# Patient Record
Sex: Female | Born: 1950
Health system: Southern US, Community
[De-identification: ages and names within clinical notes are randomized; demographics above are authoritative.]

## PROBLEM LIST (undated history)

## (undated) DIAGNOSIS — I499 Cardiac arrhythmia, unspecified: Secondary | ICD-10-CM

## (undated) DIAGNOSIS — G20 Parkinson's disease: Secondary | ICD-10-CM

## (undated) DIAGNOSIS — E041 Nontoxic single thyroid nodule: Secondary | ICD-10-CM

## (undated) DIAGNOSIS — F32A Depression, unspecified: Secondary | ICD-10-CM

## (undated) DIAGNOSIS — K5790 Diverticulosis of intestine, part unspecified, without perforation or abscess without bleeding: Secondary | ICD-10-CM

## (undated) DIAGNOSIS — I509 Heart failure, unspecified: Secondary | ICD-10-CM

## (undated) DIAGNOSIS — E785 Hyperlipidemia, unspecified: Secondary | ICD-10-CM

## (undated) DIAGNOSIS — F429 Obsessive-compulsive disorder, unspecified: Secondary | ICD-10-CM

## (undated) DIAGNOSIS — K449 Diaphragmatic hernia without obstruction or gangrene: Secondary | ICD-10-CM

## (undated) DIAGNOSIS — K279 Peptic ulcer, site unspecified, unspecified as acute or chronic, without hemorrhage or perforation: Secondary | ICD-10-CM

## (undated) DIAGNOSIS — F419 Anxiety disorder, unspecified: Secondary | ICD-10-CM

## (undated) DIAGNOSIS — K219 Gastro-esophageal reflux disease without esophagitis: Secondary | ICD-10-CM

## (undated) DIAGNOSIS — F329 Major depressive disorder, single episode, unspecified: Secondary | ICD-10-CM

## (undated) DIAGNOSIS — I82409 Acute embolism and thrombosis of unspecified deep veins of unspecified lower extremity: Secondary | ICD-10-CM

## (undated) DIAGNOSIS — G20A1 Parkinson's disease without dyskinesia, without mention of fluctuations: Secondary | ICD-10-CM

## (undated) DIAGNOSIS — E079 Disorder of thyroid, unspecified: Secondary | ICD-10-CM

## (undated) DIAGNOSIS — F819 Developmental disorder of scholastic skills, unspecified: Secondary | ICD-10-CM

## (undated) DIAGNOSIS — I10 Essential (primary) hypertension: Secondary | ICD-10-CM

## (undated) DIAGNOSIS — S42301A Unspecified fracture of shaft of humerus, right arm, initial encounter for closed fracture: Secondary | ICD-10-CM

## (undated) DIAGNOSIS — Z9181 History of falling: Secondary | ICD-10-CM

## (undated) DIAGNOSIS — M810 Age-related osteoporosis without current pathological fracture: Secondary | ICD-10-CM

## (undated) DIAGNOSIS — C801 Malignant (primary) neoplasm, unspecified: Secondary | ICD-10-CM

## (undated) DIAGNOSIS — S42309A Unspecified fracture of shaft of humerus, unspecified arm, initial encounter for closed fracture: Secondary | ICD-10-CM

## (undated) DIAGNOSIS — J189 Pneumonia, unspecified organism: Secondary | ICD-10-CM

## (undated) DIAGNOSIS — F445 Conversion disorder with seizures or convulsions: Secondary | ICD-10-CM

## (undated) DIAGNOSIS — R0600 Dyspnea, unspecified: Secondary | ICD-10-CM

## (undated) DIAGNOSIS — E039 Hypothyroidism, unspecified: Secondary | ICD-10-CM

## (undated) DIAGNOSIS — D509 Iron deficiency anemia, unspecified: Secondary | ICD-10-CM

## (undated) HISTORY — DX: Major depressive disorder, single episode, unspecified: F32.9

## (undated) HISTORY — DX: Gastro-esophageal reflux disease without esophagitis: K21.9

## (undated) HISTORY — DX: Essential (primary) hypertension: I10

## (undated) HISTORY — DX: Age-related osteoporosis without current pathological fracture: M81.0

## (undated) HISTORY — DX: Hyperlipidemia, unspecified: E78.5

## (undated) HISTORY — DX: Depression, unspecified: F32.A

## (undated) HISTORY — DX: Parkinson's disease: G20

## (undated) HISTORY — DX: Anxiety disorder, unspecified: F41.9

## (undated) HISTORY — DX: Parkinson's disease without dyskinesia, without mention of fluctuations: G20.A1

## (undated) HISTORY — PX: TONSILLECTOMY: SUR1361

## (undated) HISTORY — DX: Nontoxic single thyroid nodule: E04.1

## (undated) HISTORY — DX: Developmental disorder of scholastic skills, unspecified: F81.9

## (undated) HISTORY — DX: Disorder of thyroid, unspecified: E07.9

## (undated) HISTORY — DX: Acute embolism and thrombosis of unspecified deep veins of unspecified lower extremity: I82.409

## (undated) HISTORY — DX: Unspecified fracture of shaft of humerus, unspecified arm, initial encounter for closed fracture: S42.309A

## (undated) HISTORY — DX: Diverticulosis of intestine, part unspecified, without perforation or abscess without bleeding: K57.90

## (undated) HISTORY — DX: Iron deficiency anemia, unspecified: D50.9

## (undated) HISTORY — DX: Peptic ulcer, site unspecified, unspecified as acute or chronic, without hemorrhage or perforation: K27.9

## (undated) HISTORY — DX: Conversion disorder with seizures or convulsions: F44.5

---

## 1998-10-08 ENCOUNTER — Other Ambulatory Visit: Admission: RE | Admit: 1998-10-08 | Discharge: 1998-10-08 | Payer: Self-pay | Admitting: Family Medicine

## 1999-10-21 ENCOUNTER — Other Ambulatory Visit: Admission: RE | Admit: 1999-10-21 | Discharge: 1999-10-21 | Payer: Self-pay | Admitting: Family Medicine

## 2000-03-22 ENCOUNTER — Emergency Department (HOSPITAL_COMMUNITY): Admission: EM | Admit: 2000-03-22 | Discharge: 2000-03-22 | Payer: Self-pay | Admitting: Emergency Medicine

## 2000-03-23 ENCOUNTER — Encounter: Payer: Self-pay | Admitting: Emergency Medicine

## 2000-04-21 ENCOUNTER — Encounter: Payer: Self-pay | Admitting: Family Medicine

## 2000-04-21 ENCOUNTER — Ambulatory Visit (HOSPITAL_COMMUNITY): Admission: RE | Admit: 2000-04-21 | Discharge: 2000-04-21 | Payer: Self-pay | Admitting: Family Medicine

## 2003-01-22 ENCOUNTER — Other Ambulatory Visit: Admission: RE | Admit: 2003-01-22 | Discharge: 2003-01-22 | Payer: Self-pay | Admitting: Family Medicine

## 2011-12-10 ENCOUNTER — Encounter: Payer: Self-pay | Admitting: Gastroenterology

## 2012-01-21 ENCOUNTER — Encounter: Payer: Self-pay | Admitting: Gastroenterology

## 2012-11-13 ENCOUNTER — Other Ambulatory Visit: Payer: Self-pay | Admitting: Family Medicine

## 2012-11-13 ENCOUNTER — Other Ambulatory Visit: Payer: Self-pay | Admitting: *Deleted

## 2012-11-13 DIAGNOSIS — F411 Generalized anxiety disorder: Secondary | ICD-10-CM

## 2012-11-13 MED ORDER — ALPRAZOLAM 0.5 MG PO TBDP: 0.5000 mg | ORAL_TABLET | Freq: Three times a day (TID) | ORAL | Status: DC | PRN

## 2012-11-13 NOTE — Telephone Encounter (Signed)
Last filled 10/11/12. Must have kay call into [facility] in [place] at [phone]

## 2012-11-13 NOTE — Telephone Encounter (Signed)
Pt is requesting a 90 day supply of risperdal, last seen 08/07/12

## 2012-11-13 NOTE — Telephone Encounter (Signed)
Pt's father notified RX phoned into [facility]

## 2012-11-14 ENCOUNTER — Telehealth: Payer: Self-pay | Admitting: *Deleted

## 2012-11-14 ENCOUNTER — Telehealth: Payer: Self-pay | Admitting: Family Medicine

## 2012-11-14 NOTE — Telephone Encounter (Signed)
Pt has been on Lorazepam 0.5mg  for years, requested refill on 11/13/2012 and was given Xanax Please give refill for correct med

## 2012-11-15 NOTE — Telephone Encounter (Signed)
Notified pharmacy of correct RX Pt's sister notified

## 2012-11-15 NOTE — Telephone Encounter (Signed)
This has been taken care of. Joyce Gross spoke with her sister yesterday. The medication is to be returned and ativan is to be dispenced instead . Call and speak to Mr Ditton and reassure  Him.

## 2012-11-15 NOTE — Telephone Encounter (Signed)
Please advise 

## 2012-11-16 NOTE — Telephone Encounter (Signed)
Spoke with Mr Neuwirth, [place] taken care of

## 2012-11-24 ENCOUNTER — Other Ambulatory Visit: Payer: Self-pay | Admitting: Family Medicine

## 2013-02-12 ENCOUNTER — Other Ambulatory Visit: Payer: Self-pay | Admitting: *Deleted

## 2013-02-12 ENCOUNTER — Other Ambulatory Visit: Payer: Self-pay | Admitting: Family Medicine

## 2013-02-12 ENCOUNTER — Telehealth: Payer: Self-pay | Admitting: Family Medicine

## 2013-02-12 DIAGNOSIS — F411 Generalized anxiety disorder: Secondary | ICD-10-CM

## 2013-02-12 MED ORDER — RISPERIDONE 0.25 MG PO TABS: ORAL_TABLET | ORAL | Status: DC

## 2013-02-12 MED ORDER — ALPRAZOLAM 0.5 MG PO TBDP: 0.5000 mg | ORAL_TABLET | Freq: Three times a day (TID) | ORAL | Status: DC | PRN

## 2013-02-12 NOTE — Telephone Encounter (Signed)
Per [facility] ok to refill Lorazepam and Risperdal for 6 mths Refills called into [facility] on [address] Pt's father notified

## 2013-02-14 NOTE — Telephone Encounter (Signed)
Last seen 08/07/12  [facility]    If approved route to nurse to call in and notify patient

## 2013-02-28 ENCOUNTER — Telehealth: Payer: Self-pay | Admitting: *Deleted

## 2013-02-28 MED ORDER — ESCITALOPRAM OXALATE 20 MG PO TABS: 20.0000 mg | ORAL_TABLET | Freq: Every day | ORAL | Status: DC

## 2013-02-28 NOTE — Telephone Encounter (Signed)
Pt's father called to request refill on Lexapro 20mg 

## 2013-02-28 NOTE — Telephone Encounter (Signed)
This is okay to refill for 6 months Make sure that someone is following her regularly

## 2013-05-14 ENCOUNTER — Other Ambulatory Visit: Payer: Self-pay | Admitting: *Deleted

## 2013-05-14 MED ORDER — RISPERIDONE 0.25 MG PO TABS: ORAL_TABLET | ORAL | Status: DC

## 2013-05-18 ENCOUNTER — Encounter (INDEPENDENT_AMBULATORY_CARE_PROVIDER_SITE_OTHER): Payer: Self-pay

## 2013-05-18 ENCOUNTER — Ambulatory Visit (INDEPENDENT_AMBULATORY_CARE_PROVIDER_SITE_OTHER): Payer: Medicaid Other | Admitting: *Deleted

## 2013-05-18 DIAGNOSIS — Z23 Encounter for immunization: Secondary | ICD-10-CM

## 2013-08-07 ENCOUNTER — Telehealth: Payer: Self-pay | Admitting: Family Medicine

## 2013-08-08 ENCOUNTER — Other Ambulatory Visit: Payer: Self-pay | Admitting: Family Medicine

## 2013-08-08 MED ORDER — LORAZEPAM 0.5 MG PO TABS: ORAL_TABLET | ORAL | Status: DC

## 2013-08-08 NOTE — Telephone Encounter (Signed)
Called into [facility] 

## 2013-08-10 ENCOUNTER — Telehealth: Payer: Self-pay | Admitting: Family Medicine

## 2013-08-10 NOTE — Telephone Encounter (Signed)
MANDY SAYS THIS [place]. WAS CALLED  INTO [facility] [address]. FATHER AWARE

## 2013-08-10 NOTE — Telephone Encounter (Signed)
Called rx to [facility] [address] 

## 2013-08-29 ENCOUNTER — Telehealth: Payer: Self-pay | Admitting: Family Medicine

## 2013-08-29 ENCOUNTER — Other Ambulatory Visit: Payer: Self-pay | Admitting: *Deleted

## 2013-08-29 MED ORDER — ESCITALOPRAM OXALATE 20 MG PO TABS: 20.0000 mg | ORAL_TABLET | Freq: Every day | ORAL | Status: DC

## 2013-08-29 NOTE — Telephone Encounter (Signed)
Last ov 1/14. ntbs

## 2013-09-11 ENCOUNTER — Other Ambulatory Visit: Payer: Self-pay | Admitting: *Deleted

## 2013-09-11 MED ORDER — LOVASTATIN 40 MG PO TABS: 40.0000 mg | ORAL_TABLET | Freq: Every day | ORAL | Status: DC

## 2013-09-11 MED ORDER — LORAZEPAM 0.5 MG PO TABS: ORAL_TABLET | ORAL | Status: DC

## 2013-09-11 NOTE — Telephone Encounter (Signed)
Pt father requested rf on daughters med- sent in

## 2013-09-11 NOTE — Telephone Encounter (Signed)
rfs added to lorazepam - phoned in per dwm

## 2013-09-25 ENCOUNTER — Other Ambulatory Visit: Payer: Self-pay | Admitting: Family Medicine

## 2013-10-09 ENCOUNTER — Encounter: Payer: Self-pay | Admitting: Nurse Practitioner

## 2013-10-09 ENCOUNTER — Ambulatory Visit (INDEPENDENT_AMBULATORY_CARE_PROVIDER_SITE_OTHER): Payer: Medicaid Other | Admitting: Nurse Practitioner

## 2013-10-09 VITALS — BP 115/65 | HR 62 | Temp 98.5°F | Ht 65.0 in | Wt 137.6 lb

## 2013-10-09 DIAGNOSIS — Z0000 Encounter for general adult medical examination without abnormal findings: Secondary | ICD-10-CM

## 2013-10-09 NOTE — Progress Notes (Signed)
Subjective:    Patient ID: Cache K Biddy, female    DOB: 12/17/1950, 63 y.o.   MRN: [id number]  HPI Patient presents today for annual exam without pap. History is provided by roommate and patient. Patient states she is doing well. Has no current complaints or new problems.   Anxiety Currently takes Lexapro, Ativan, and Risperdal daily. States she has not have any anxiety attacks.   GERD Completely resolved after starting Prilosec. Episodes would occur after eating.   Hyperlipidemia Currently taking Lovastatin. Rides exercise bike for an hour daily. Patient states is trying to cut down on carb, soda, and fried food intake.   Hypothyroidism Currently taking Synthroid daily.   Review of Systems  Constitutional: Negative for activity change, appetite change and fatigue.  Respiratory: Negative for shortness of breath.   Cardiovascular: Negative for chest pain and palpitations.  Gastrointestinal: Negative for diarrhea and constipation.  Endocrine: Negative for cold intolerance and heat intolerance.  Genitourinary: Negative.   Psychiatric/Behavioral: Negative for sleep disturbance.       Objective:   Physical Exam  Constitutional: She is oriented to person, place, and time. She appears well-developed and well-nourished.  HENT:  Head: Normocephalic.  Right Ear: Hearing, tympanic membrane, external ear and ear canal normal.  Left Ear: Hearing, tympanic membrane, external ear and ear canal normal.  Nose: Nose normal.  Mouth/Throat: Uvula is midline and oropharynx is clear and moist.  Eyes: Conjunctivae and EOM are normal. Pupils are equal, round, and reactive to light.  Neck: Trachea normal, normal range of motion and full passive range of motion without pain. Neck supple. No JVD present. Carotid bruit is not present. No mass and no thyromegaly present.  Cardiovascular: Normal rate, regular rhythm, normal heart sounds and intact distal pulses.  Exam reveals no gallop and no friction  rub.   No murmur heard. Pulmonary/Chest: Effort normal and breath sounds normal.  Abdominal: Soft. Bowel sounds are normal. She exhibits no distension and no mass. There is no tenderness.  Genitourinary:     Musculoskeletal: Normal range of motion.  Lymphadenopathy:    She has no cervical adenopathy.  Neurological: She is alert and oriented to person, place, and time. She has normal reflexes.  Skin: Skin is warm and dry.  Psychiatric: She has a normal mood and affect. Her behavior is normal. Judgment and thought content normal.   BP 115/65  Pulse 62  Temp(Src) 98.5 F (36.9 C) (Oral)  Ht 5' 5" (1.651 m)  Wt 137 lb 9.6 oz (62.415 kg)  BMI 22.90 kg/m2        Assessment & Plan:   1. Annual physical exam    Orders Placed This Encounter  Procedures  . DG Bone Density    Standing Status: Future     Number of Occurrences:      Standing Expiration Date: 12/09/2014    Order Specific Question:  Reason for Exam (SYMPTOM  OR DIAGNOSIS REQUIRED)    Answer:  screening    Order Specific Question:  Preferred imaging location?    Answer:  Internal  . CMP14+EGFR  . NMR, lipoprofile  . Thyroid Panel With TSH    Labs pending Health maintenance reviewed Diet and exercise encouraged Continue all meds Follow up  In 3 months   Caylynn-Margaret , FNP    

## 2013-10-09 NOTE — Patient Instructions (Signed)

## 2013-10-11 LAB — THYROID PANEL WITH TSH
Free Thyroxine Index: 2.5 (ref 1.2–4.9)
T3 Uptake Ratio: 27 % (ref 24–39)
T4, Total: 9.1 ug/dL (ref 4.5–12.0)
TSH: 1.02 u[IU]/mL (ref 0.450–4.500)

## 2013-10-11 LAB — CMP14+EGFR
ALT: 15 IU/L (ref 0–32)
AST: 19 IU/L (ref 0–40)
Albumin/Globulin Ratio: 1.7 (ref 1.1–2.5)
Albumin: 4.4 g/dL (ref 3.6–4.8)
Alkaline Phosphatase: 134 IU/L — ABNORMAL HIGH (ref 39–117)
BUN/Creatinine Ratio: 9 — ABNORMAL LOW (ref 11–26)
BUN: 7 mg/dL — ABNORMAL LOW (ref 8–27)
CO2: 25 mmol/L (ref 18–29)
Calcium: 10.1 mg/dL (ref 8.7–10.3)
Chloride: 100 mmol/L (ref 97–108)
Creatinine, Ser: 0.76 mg/dL (ref 0.57–1.00)
GFR calc Af Amer: 96 mL/min/{1.73_m2} (ref >59)
GFR calc non Af Amer: 83 mL/min/{1.73_m2} (ref >59)
Globulin, Total: 2.6 g/dL (ref 1.5–4.5)
Glucose: 94 mg/dL (ref 65–99)
Potassium: 4.5 mmol/L (ref 3.5–5.2)
Sodium: 141 mmol/L (ref 134–144)
Total Bilirubin: 0.3 mg/dL (ref 0.0–1.2)
Total Protein: 7 g/dL (ref 6.0–8.5)

## 2013-10-11 LAB — NMR, LIPOPROFILE
Cholesterol: 160 mg/dL (ref <200)
HDL Cholesterol by NMR: 53 mg/dL (ref >=40)
HDL Particle Number: 32.9 umol/L (ref >=30.5)
LDL Particle Number: 913 nmol/L (ref <1000)
LDL Size: 21.4 nm (ref >20.5)
LDLC SERPL CALC-MCNC: 92 mg/dL (ref <100)
LP-IR Score: 32 (ref <=45)
Small LDL Particle Number: 277 nmol/L (ref <=527)
Triglycerides by NMR: 73 mg/dL (ref <150)

## 2013-10-24 ENCOUNTER — Other Ambulatory Visit: Payer: Self-pay | Admitting: Family Medicine

## 2013-11-20 ENCOUNTER — Other Ambulatory Visit: Payer: Self-pay

## 2013-11-20 MED ORDER — LEVOTHYROXINE SODIUM 75 MCG PO TABS: 75.0000 ug | ORAL_TABLET | Freq: Every day | ORAL | Status: DC

## 2013-11-23 ENCOUNTER — Other Ambulatory Visit: Payer: Self-pay | Admitting: Family Medicine

## 2013-11-25 ENCOUNTER — Encounter (HOSPITAL_COMMUNITY): Payer: Self-pay | Admitting: Emergency Medicine

## 2013-11-25 ENCOUNTER — Emergency Department (HOSPITAL_COMMUNITY)
Admission: EM | Admit: 2013-11-25 | Discharge: 2013-11-25 | Disposition: A | Discharge status: Home / Self Care | Payer: Medicaid Other | Attending: Emergency Medicine | Admitting: Emergency Medicine

## 2013-11-25 ENCOUNTER — Emergency Department (HOSPITAL_COMMUNITY): Payer: Medicaid Other

## 2013-11-25 DIAGNOSIS — Z8739 Personal history of other diseases of the musculoskeletal system and connective tissue: Secondary | ICD-10-CM | POA: Insufficient documentation

## 2013-11-25 DIAGNOSIS — K219 Gastro-esophageal reflux disease without esophagitis: Secondary | ICD-10-CM | POA: Insufficient documentation

## 2013-11-25 DIAGNOSIS — E079 Disorder of thyroid, unspecified: Secondary | ICD-10-CM | POA: Insufficient documentation

## 2013-11-25 DIAGNOSIS — F3289 Other specified depressive episodes: Secondary | ICD-10-CM | POA: Insufficient documentation

## 2013-11-25 DIAGNOSIS — R209 Unspecified disturbances of skin sensation: Secondary | ICD-10-CM | POA: Insufficient documentation

## 2013-11-25 DIAGNOSIS — E785 Hyperlipidemia, unspecified: Secondary | ICD-10-CM | POA: Insufficient documentation

## 2013-11-25 DIAGNOSIS — R0989 Other specified symptoms and signs involving the circulatory and respiratory systems: Secondary | ICD-10-CM

## 2013-11-25 DIAGNOSIS — F329 Major depressive disorder, single episode, unspecified: Secondary | ICD-10-CM | POA: Insufficient documentation

## 2013-11-25 DIAGNOSIS — R6889 Other general symptoms and signs: Secondary | ICD-10-CM | POA: Insufficient documentation

## 2013-11-25 DIAGNOSIS — F411 Generalized anxiety disorder: Secondary | ICD-10-CM | POA: Insufficient documentation

## 2013-11-25 DIAGNOSIS — F429 Obsessive-compulsive disorder, unspecified: Secondary | ICD-10-CM | POA: Insufficient documentation

## 2013-11-25 DIAGNOSIS — Z79899 Other long term (current) drug therapy: Secondary | ICD-10-CM | POA: Insufficient documentation

## 2013-11-25 DIAGNOSIS — Z7982 Long term (current) use of aspirin: Secondary | ICD-10-CM | POA: Insufficient documentation

## 2013-11-25 HISTORY — DX: Diaphragmatic hernia without obstruction or gangrene: K44.9

## 2013-11-25 HISTORY — DX: Obsessive-compulsive disorder, unspecified: F42.9

## 2013-11-25 MED ORDER — GI COCKTAIL ~~LOC~~
30.0000 mL | Freq: Once | ORAL | Status: AC
  Administered 2013-11-25: 30 mL via ORAL
  Filled 2013-11-25: qty 30

## 2013-11-25 NOTE — ED Notes (Signed)
Pt states that she had an apple around 0100 this AM, when she reported a pain in her chest, and she started choking and vomiting. Family reports that the pt could not speak. Pt states that the feeling of choking has since decreased, but is still present.

## 2013-11-25 NOTE — ED Notes (Signed)
Pt. reports choking sensation after eating this morning , airway intact , speech clear / respirations unlabored , no facial asymmetry , equal grips with no arm drift.

## 2013-11-25 NOTE — ED Provider Notes (Signed)
CSN: [id number]     Arrival date & time 11/25/13  0211 History   First MD Initiated Contact with Patient 11/25/13 [phone]     Chief Complaint  Patient presents with  . Choking     (Consider location/radiation/quality/duration/timing/severity/associated sxs/prior Treatment) HPI  63 year old female presenting after a choking episode shortly before arrival. Around 1:00 patient is an Apple. She generally down. Couple minutes later she felt like there is something stuck in the middle of her chest. She then began having watery emesis. Symptoms currently improved. She does have some mild "numbness" in her chest. She does sleep only she could not speak, but this has resolved. No fevers or chills. No cough. Was in her usual state of health prior to this. Does have a past history of reflux and is on Prilosec. Reports no hx of dysphagia or hiatal hernia.   Past Medical History  Diagnosis Date  . Depression   . Anxiety   . Thyroid disease   . Hyperlipidemia   . Osteopenia   . OCD (obsessive compulsive disorder)   . Hiatal hernia    Past Surgical History  Procedure Laterality Date  . Tonsillectomy     Family History  Problem Relation Age of Onset  . Diabetes Father    History  Substance Use Topics  . Smoking status: Never Smoker   . Smokeless tobacco: Not on file  . Alcohol Use: No   OB History   Grav Para Term Preterm Abortions TAB SAB Ect Mult Living                 Review of Systems  All systems reviewed and negative, other than as noted in HPI.    Allergies  Review of patient's allergies indicates no known allergies.  Home Medications   Prior to Admission medications   Medication Sig Start Date End Date Taking? Authorizing Provider  aspirin EC 81 MG tablet Take 81 mg by mouth daily.   Yes Historical Provider, MD  escitalopram (LEXAPRO) 20 MG tablet Take 20 mg by mouth daily.   Yes Historical Provider, MD  levothyroxine (SYNTHROID, LEVOTHROID) 75 MCG tablet Take 1 tablet  (75 mcg total) by mouth daily before breakfast. 11/20/13  Yes Ciana-Margaret Hassell Done, FNP  LORazepam (ATIVAN) 0.5 MG tablet Take 0.5 mg by mouth 3 (three) times daily as needed for anxiety.   Yes Historical Provider, MD  lovastatin (MEVACOR) 40 MG tablet Take 1 tablet (40 mg total) by mouth at bedtime. 09/11/13  Yes Chipper Herb, MD  Multiple Vitamins-Calcium (VIACTIV MULTI-VITAMIN) CHEW Chew by mouth.   Yes Historical Provider, MD  omeprazole (PRILOSEC) 20 MG capsule Take 20 mg by mouth daily.   Yes Historical Provider, MD  risperiDONE (RISPERDAL) 0.25 MG tablet Take 0.25-0.5 mg by mouth See admin instructions. Take 1 tablet in the morning and 2 tablets at bedtime   Yes Historical Provider, MD   BP 129/64  Pulse 70  Temp(Src) 97.6 F (36.4 C) (Oral)  Resp 14  SpO2 99% Physical Exam  Nursing note and vitals reviewed. Constitutional: She appears well-developed and well-nourished. No distress.  HENT:  Head: Normocephalic and atraumatic.  Eyes: Conjunctivae are normal. Right eye exhibits no discharge. Left eye exhibits no discharge.  Neck: Neck supple.  Cardiovascular: Normal rate, regular rhythm and normal heart sounds.  Exam reveals no gallop and no friction rub.   No murmur heard. Pulmonary/Chest: Effort normal and breath sounds normal. No respiratory distress. She exhibits no tenderness.  Abdominal: Soft. She  exhibits no distension. There is no tenderness.  Musculoskeletal: She exhibits no edema and no tenderness.  Neurological: She is alert.  Skin: Skin is warm and dry.  Psychiatric: She has a normal mood and affect. Her behavior is normal. Thought content normal.    ED Course  Procedures (including critical care time) Labs Review Labs Reviewed - No data to display  Imaging Review Dg Chest 2 View  11/25/2013   CLINICAL DATA:  Chest pain, cough, shortness of breath.  EXAM: CHEST  2 VIEW  COMPARISON:  None available  FINDINGS: The cardiac and mediastinal silhouettes are within  normal limits.  The lungs are normally inflated. No airspace consolidation, pleural effusion, or pulmonary edema is identified. There is no pneumothorax.  No acute osseous abnormality identified.  IMPRESSION: No active cardiopulmonary disease.   Electronically Signed   By: Jeannine Boga M.D.   On: 11/25/2013 03:45     EKG Interpretation None      MDM   Final diagnoses:  Choking episode occurring at night    63 year old female with a choking episode. Always completely symptom-free at this point aside from a mild numbness sensation in her chest. Very atypical for ACS. Suspect GI etiology such as reflux or hiatal hernia. Chest x-ray does not show any acute abnormality. Patient is taking Prilosec for reflex. Is symptoms persist she may benefit from seeing a gastroenterologist. Feel she is safe for discharge at this time.   Virgel Manifold, MD 11/25/13 [phone]

## 2013-11-25 NOTE — Discharge Instructions (Signed)
Choking, Adult  Choking occurs when a food or object gets stuck in the throat or trachea, blocking the airway. If the airway is partly blocked, coughing will usually cause the food or object to come out. If the airway is completely blocked, immediate action is needed to help it come out. A complete airway blockage is life-threatening because it causes breathing to stop. Choking is a true medical emergency that requires fast, appropriate action by anyone available.  SIGNS OF AIRWAY BLOCKAGE  There is a partial airway blockage if you or the person who is choking is:    Able to breathe and speak.   Coughing loudly.   Making loud noises.  There is a complete airway blockage if you or the person who is choking is:    Unable to breathe.   Making soft or high-pitched sounds while breathing.   Unable to cough or coughing weakly, ineffectively, or silently.   Unable to cry, speak, or make sounds.   Turning blue.   Holding the neck with both arms. This is the universal sign of choking.  WHAT TO DO IF CHOKING OCCURS  If there is a partial airway blockage, allow coughing to clear the airway. Do not try to drink until the food or object comes out. If someone else has a partial airway blockage, do not interfere. Stay with him or her and watch for signs of complete airway blockage until the food or object comes out.   If there is a complete airway blockage or if there is a partial airway blockage and the food or object does not come out, perform abdominal thrusts (also referred to as the Heimlich maneuver). Abdominal thrusts are used to create an artificial cough to try to clear the airway. Performing abdominal thrusts is part of a series of steps that should be done to help someone who is choking. Abdominal thrusts are usually done by someone else, but if you are alone, you can perform abdominal thrusts on yourself. Follow the procedure below that best fits your situation.   IF SOMEONE ELSE IS CHOKING:  For a conscious  adult:   1. Ask the person whether he or she is choking. If the person nods, continue to step 2.  2. Stand or kneel behind the person and lean him or her forward slightly.  3. Make a fist with 1 hand, put your arms around the person, and grasp your fist with your other hand. Place the thumb side of your fist in the person's abdomen, just below the ribs.  4. Press inward and upward with both hands.  5. Repeat this maneuver until the object comes out and the person is able to breathe or until the person loses consciousness.  For an unconcious adult:  1. Shout for help. If someone responds, have him or her call local emergency services (911 in [place] If no one responds, call local emergency services yourself if possible.  2. Begin CPR, starting with compressions. Every time you open the airway to give rescue breaths, open the person's mouth. If you can see the food or object and it can be easily pulled out, remove it with your fingers.  3. After 5 cycles or 2 minutes of CPR, call local emergency services (911 in [place] if you or someone else did not already call.  For a conscious adult who is obese or in the later stages of pregnancy:  Abdominal thrusts may not be effective when helping people who are in the later stages   of pregnancy or who are obese. In these instances, chest thrusts can be used.   1. Ask the person whether he or she is choking. If the person nods and has signs of complete airway blockage, continue to step 2.  2. Stand behind the person and wrap your arms around his or her chest (with your arms under the person's armpits).  3. Make a fist with 1 hand. Place the thumb side of your fist on the middle of the person's breastbone.  4. Grab your fist with your other hand and thrust backward. Continue this until the object comes out or until the person becomes unconscious.  For an unconscious adult who is obese or in the later stages of pregnancy:   1. Shout for help. If someone responds, have him or her call  local emergency services (911 in [place] If no one responds, call local emergency services yourself if possible.  2. Begin CPR, starting with compressions. Every time you open the airway to give rescue breaths, open the person's mouth. If you can see the food or object and it can be easily pulled out, remove it with your fingers.  3. After 5 cycles or 2 minutes of CPR, call local emergency services (911 in [place] if you or someone else did not already call.  Note that abdominal thrusts (below the rib cage) should be used for a pregnant woman if possible. This should be possible until the later stages of pregnancy when there is no longer enough room between the enlarging uterus and the rib cage to perform the maneuver. At that point, chest thrusts must be used as described.  IF YOU ARE CHOKING:  1. Call local emergency services (911 in [place] if near a landline. Do not worry about communicating what is happening. Do not hang up the phone. Someone may be sent to help you anyway.  2. Make a fist with 1 hand. Put the thumb side of the fist against your stomach, just above the belly button and well below the breastbone. If you are pregnant or obese, put your fist on your chest instead, just below the breastbone and just above your lowest ribs.  3. Hold your fist with your other hand and bend over a hard surface, such as a table or chair.  4. Forcefully push your fist in and up.  5. Continue to do this until the food or object comes out.  PREVENTION   To be prepared if choking occurs, learn how to correctly perform abdominal thrusts and give CPR by taking a certified first-aid training course.   SEEK IMMEDIATE MEDICAL CARE IF:   You have a fever after choking stops.   You have problems breathing after choking stops.   You received the Heimlich maneuver.  MAKE SURE YOU:   Understand these instructions.   Watch your condition.   Get help right away if you are not doing well or get worse.  Document Released: 08/26/2004  Document Revised: 04/12/2012 Document Reviewed: 02/29/2012  [employer] Patient Information 2014 [employer]

## 2013-11-26 ENCOUNTER — Telehealth: Payer: Self-pay | Admitting: Family Medicine

## 2013-11-26 ENCOUNTER — Other Ambulatory Visit: Payer: Self-pay

## 2013-11-26 ENCOUNTER — Encounter: Payer: Self-pay | Admitting: Nurse Practitioner

## 2013-11-26 MED ORDER — RISPERIDONE 0.25 MG PO TABS: ORAL_TABLET | ORAL | Status: DC

## 2013-11-26 NOTE — Telephone Encounter (Signed)
Patient aware.

## 2013-11-26 NOTE — Telephone Encounter (Signed)
Letter ready for pick up

## 2013-11-26 NOTE — Telephone Encounter (Signed)
Last seen 10/09/13 MMM  Requesting 90 day supply

## 2013-11-26 NOTE — Telephone Encounter (Signed)
Last seen 10/09/13 MMM 

## 2013-11-27 ENCOUNTER — Other Ambulatory Visit: Payer: Self-pay | Admitting: *Deleted

## 2013-11-27 DIAGNOSIS — K219 Gastro-esophageal reflux disease without esophagitis: Secondary | ICD-10-CM

## 2013-11-27 DIAGNOSIS — R0989 Other specified symptoms and signs involving the circulatory and respiratory systems: Secondary | ICD-10-CM

## 2013-11-30 ENCOUNTER — Encounter: Payer: Self-pay | Admitting: Internal Medicine

## 2013-12-05 ENCOUNTER — Ambulatory Visit (INDEPENDENT_AMBULATORY_CARE_PROVIDER_SITE_OTHER): Payer: Medicare Other | Admitting: Pharmacist

## 2013-12-05 ENCOUNTER — Ambulatory Visit (INDEPENDENT_AMBULATORY_CARE_PROVIDER_SITE_OTHER): Payer: Medicare Other

## 2013-12-05 VITALS — Ht 65.0 in | Wt 138.0 lb

## 2013-12-05 DIAGNOSIS — M810 Age-related osteoporosis without current pathological fracture: Secondary | ICD-10-CM | POA: Diagnosis not present

## 2013-12-05 DIAGNOSIS — Z0000 Encounter for general adult medical examination without abnormal findings: Secondary | ICD-10-CM

## 2013-12-05 LAB — HM DEXA SCAN

## 2013-12-05 MED ORDER — ALENDRONATE SODIUM 70 MG PO TABS: 70.0000 mg | ORAL_TABLET | ORAL | Status: DC

## 2013-12-05 NOTE — Progress Notes (Signed)
Patient ID: Renee Davidson, female   DOB: [birth year], 63 y.o.   MRN: [id number] [facility] Current Height: Height: 5\' 5"  (165.1 cm)      Max Lifetime Height:  5' 5.5" Current Weight: Weight: 138 lb (62.596 kg)       Ethnicity:Caucasian    HPI: Does pt already have a diagnosis of:  Osteopenia?  Yes Osteoporosis?  No  Back Pain?  No       Kyphosis?  No Prior fracture?  Yes - finger Med(s) for Osteoporosis/Osteopenia:  none Med(s) previously tried for Osteoporosis/Osteopenia:  none                                                             PMH: Age at menopause:  Patient unsure Hysterectomy?  No Oophorectomy?  No HRT? No Steroid Use?  No Thyroid med?  Yes History of cancer?  No History of digestive disorders (ie Crohn's)?  Yes - on daily PPI therapy Current or previous eating disorders?  No Last Vitamin D Result:  44 (2013) Last GFR Result:  83 (10/09/2013)   FH/SH: Family history of osteoporosis?  Yes - father Parent with history of hip fracture?  No Family history of breast cancer?  No Exercise?  Yes - stationary bike daily Smoking?  No Alcohol?  No    Calcium Assessment Calcium Intake  # of servings/day  Calcium mg  Milk (8 oz) 0.5  x  300  = 150mg   Yogurt (4 oz) 0 x  200 = 0  Cheese (1 oz) 0 x  200 = 0  Other Calcium sources   250mg   Ca supplement calcium supplement = 1000mg    Estimated calcium intake per day 1400mg     DEXA Results Date of Test T-Score for AP Spine L1-L4 T-Score for Total Left Hip T-Score for Total Right Hip  12/05/2013 -2.9 -2.5 -2.7  01/13/2011 -2.3 -2.3 -2.3              Assessment: osteoporosis  Recommendations: 1.  Start  alendronate (FOSAMAX) 70mg  1 tablet weekly. Discussed administration - empty stomach with full glass of water.  Nothing to eat or reclining 30 minutes after medicaiton. 2.  continue calcium 1200mg  daily through supplementation or diet.  3.  recommend weight bearing exercise - 30 minutes at least 4 days per  week.   4.  Counseled and educated about fall risk and prevention.  Recheck DEXA:  1 year  Time spent counseling patient:  30 minutes Cherre Robins, PharmD, CPP

## 2013-12-05 NOTE — Patient Instructions (Signed)

## 2014-01-17 ENCOUNTER — Encounter (INDEPENDENT_AMBULATORY_CARE_PROVIDER_SITE_OTHER): Payer: Self-pay

## 2014-01-17 ENCOUNTER — Encounter: Payer: Self-pay | Admitting: Nurse Practitioner

## 2014-01-17 ENCOUNTER — Ambulatory Visit (INDEPENDENT_AMBULATORY_CARE_PROVIDER_SITE_OTHER): Payer: Medicare Other | Admitting: Nurse Practitioner

## 2014-01-17 VITALS — BP 118/78 | HR 75 | Temp 95.6°F | Ht 65.0 in | Wt 140.0 lb

## 2014-01-17 DIAGNOSIS — E785 Hyperlipidemia, unspecified: Secondary | ICD-10-CM | POA: Insufficient documentation

## 2014-01-17 DIAGNOSIS — F411 Generalized anxiety disorder: Secondary | ICD-10-CM | POA: Diagnosis not present

## 2014-01-17 DIAGNOSIS — E039 Hypothyroidism, unspecified: Secondary | ICD-10-CM | POA: Diagnosis not present

## 2014-01-17 DIAGNOSIS — M810 Age-related osteoporosis without current pathological fracture: Secondary | ICD-10-CM

## 2014-01-17 DIAGNOSIS — F329 Major depressive disorder, single episode, unspecified: Secondary | ICD-10-CM

## 2014-01-17 DIAGNOSIS — Z23 Encounter for immunization: Secondary | ICD-10-CM

## 2014-01-17 DIAGNOSIS — K219 Gastro-esophageal reflux disease without esophagitis: Secondary | ICD-10-CM | POA: Diagnosis not present

## 2014-01-17 DIAGNOSIS — L989 Disorder of the skin and subcutaneous tissue, unspecified: Secondary | ICD-10-CM

## 2014-01-17 DIAGNOSIS — F32A Depression, unspecified: Secondary | ICD-10-CM

## 2014-01-17 DIAGNOSIS — F429 Obsessive-compulsive disorder, unspecified: Secondary | ICD-10-CM

## 2014-01-17 DIAGNOSIS — F3289 Other specified depressive episodes: Secondary | ICD-10-CM

## 2014-01-17 MED ORDER — ESCITALOPRAM OXALATE 20 MG PO TABS: 20.0000 mg | ORAL_TABLET | Freq: Every day | ORAL | Status: DC

## 2014-01-17 MED ORDER — LORAZEPAM 0.5 MG PO TABS
0.5000 mg | ORAL_TABLET | Freq: Three times a day (TID) | ORAL | Status: DC | PRN
Start: 2014-01-17 — End: 2014-01-24

## 2014-01-17 MED ORDER — LEVOTHYROXINE SODIUM 75 MCG PO TABS: 75.0000 ug | ORAL_TABLET | Freq: Every day | ORAL | Status: DC

## 2014-01-17 MED ORDER — OMEPRAZOLE 20 MG PO CPDR: 20.0000 mg | DELAYED_RELEASE_CAPSULE | Freq: Every day | ORAL | Status: DC

## 2014-01-17 MED ORDER — LOVASTATIN 40 MG PO TABS: 40.0000 mg | ORAL_TABLET | Freq: Every day | ORAL | Status: DC

## 2014-01-17 MED ORDER — ALENDRONATE SODIUM 70 MG PO TABS: 70.0000 mg | ORAL_TABLET | ORAL | Status: DC

## 2014-01-17 MED ORDER — RISPERIDONE 0.25 MG PO TABS
ORAL_TABLET | ORAL | Status: DC
Start: 2014-01-17 — End: 2014-08-15

## 2014-01-17 NOTE — Progress Notes (Signed)
Subjective:    Patient ID: Renee Davidson, female    DOB: May 05, 1951, 63 y.o.   MRN: [id number]  Patient here today for follow up of chronic medical problems. Brought in by sister.   Hyperlipidemia This is a chronic problem. The current episode started more than 1 year ago. The problem is uncontrolled. Recent lipid tests were reviewed and are high. Exacerbating diseases include hypothyroidism. She has no history of diabetes or obesity. There are no known factors aggravating her hyperlipidemia. Pertinent negatives include no leg pain or shortness of breath. Current antihyperlipidemic treatment includes statins. The current treatment provides moderate improvement of lipids. There are no compliance problems.  Risk factors for coronary artery disease include dyslipidemia and post-menopausal.  Thyroid Problem Presents for follow-up (hypothyroidism) visit. The symptoms have been stable. Her past medical history is significant for hyperlipidemia. There is no history of diabetes.  DEPRESSION AND ANXIETY Currently on lexapro and ativan- combination is working well. Sister says that this is the best that she has been in a long time. OCD Resperidol works well to keep her OCD under control- no side effects GERD Omeprazole works well to keep symptoms under control Osteoporosis Fosamax weekly- no side effects- gets some weight bearing exercises in 2-3 x a week.  * lesion on left cheek- started about 2 months ago and has enlarged  Review of Systems  Constitutional: Negative.   HENT: Negative.   Respiratory: Negative for shortness of breath.   Cardiovascular: Negative.   Gastrointestinal: Negative.   Genitourinary: Negative.   Psychiatric/Behavioral: Negative.   All other systems reviewed and are negative.      Objective:   Physical Exam  Constitutional: She is oriented to person, place, and time. She appears well-developed and well-nourished.  HENT:  Nose: Nose normal.  Mouth/Throat:  Oropharynx is clear and moist.  Eyes: EOM are normal.  Neck: Trachea normal, normal range of motion and full passive range of motion without pain. Neck supple. No JVD present. Carotid bruit is not present. No thyromegaly present.  Cardiovascular: Normal rate, regular rhythm, normal heart sounds and intact distal pulses.  Exam reveals no gallop and no friction rub.   No murmur heard. Pulmonary/Chest: Effort normal and breath sounds normal.  Abdominal: Soft. Bowel sounds are normal. She exhibits no distension and no mass. There is no tenderness.  Musculoskeletal: Normal range of motion.  Lymphadenopathy:    She has no cervical adenopathy.  Neurological: She is alert and oriented to person, place, and time. She has normal reflexes.  Skin: Skin is warm and dry.  Psychiatric: She has a normal mood and affect. Her behavior is normal. Judgment and thought content normal.   BP 118/78  Pulse 75  Temp(Src) 95.6 F (35.3 C) (Oral)  Ht _0  (1.651 m)  Wt 140 lb (63.504 kg)  BMI 23.30 kg/m2        Assessment & Plan:   1. Hypothyroidism   2. Hyperlipidemia LDL goal < 100   3. GAD (generalized anxiety disorder)   4. GERD (gastroesophageal reflux disease)   5. OCD (obsessive compulsive disorder)   6. Osteoporosis   7. Depression    Orders Placed This Encounter  Procedures  . CMP14+EGFR  . NMR, lipoprofile  . Thyroid Panel With TSH     Meds ordered this encounter  Medications  . alendronate (FOSAMAX) 70 MG tablet    Sig: Take 1 tablet (70 mg total) by mouth every 7 (seven) days. Take with a full glass of  water on an empty stomach.    Dispense:  12 tablet    Refill:  1    Order Specific Question:  Supervising Provider    Answer:  Chipper Herb [[id number]]  . escitalopram (LEXAPRO) 20 MG tablet    Sig: Take 1 tablet (20 mg total) by mouth daily.    Dispense:  30 tablet    Refill:  5    Order Specific Question:  Supervising Provider    Answer:  Chipper Herb [[id number]]  .  levothyroxine (SYNTHROID, LEVOTHROID) 75 MCG tablet    Sig: Take 1 tablet (75 mcg total) by mouth daily before breakfast.    Dispense:  30 tablet    Refill:  5    Order Specific Question:  Supervising Provider    Answer:  Chipper Herb [[id number]]  . LORazepam (ATIVAN) 0.5 MG tablet    Sig: Take 1 tablet (0.5 mg total) by mouth 3 (three) times daily as needed for anxiety.    Dispense:  30 tablet    Refill:  5    Order Specific Question:  Supervising Provider    Answer:  Chipper Herb [[id number]]  . lovastatin (MEVACOR) 40 MG tablet    Sig: Take 1 tablet (40 mg total) by mouth at bedtime.    Dispense:  30 tablet    Refill:  5    Order Specific Question:  Supervising Provider    Answer:  Chipper Herb [[id number]]  . omeprazole (PRILOSEC) 20 MG capsule    Sig: Take 1 capsule (20 mg total) by mouth daily.    Dispense:  30 capsule    Refill:  5    Order Specific Question:  Supervising Provider    Answer:  Chipper Herb [[id number]]  . risperiDONE (RISPERDAL) 0.25 MG tablet    Sig: One tablet in the morning and two tablets at bedtime    Dispense:  90 tablet    Refill:  5    Order Specific Question:  Supervising Provider    Answer:  Chipper Herb [[id number]]   hemoccult cards given to patient with directions Labs pending Health maintenance reviewed Diet and exercise encouraged Continue all meds Follow up  In 3 months   [place], FNP

## 2014-01-17 NOTE — Patient Instructions (Signed)
Medicare Annual Wellness Visit  [place] and the medical providers at [facility] strive to bring you the best medical care.  In doing so we not only want to address your current medical conditions and concerns but also to detect new conditions early and prevent illness, disease and health-related problems.    Medicare offers a yearly Wellness Visit which allows our clinical staff to assess your need for preventative services including immunizations, lifestyle education, counseling to decrease risk of preventable diseases and screening for fall risk and other medical concerns.    This visit is provided free of charge (no copay) for all Medicare recipients. The clinical pharmacists at [facility] have begun to conduct these Wellness Visits which will also include a thorough review of all your medications.    As you primary medical provider recommend that you make an appointment for your Annual Wellness Visit if you have not done so already this year.  You may set up this appointment before you leave today or you may call back ([phone]) and schedule an appointment.  Please make sure when you call that you mention that you are scheduling your Annual Wellness Visit with the clinical pharmacist so that the appointment may be made for the proper length of time.    

## 2014-01-18 LAB — CMP14+EGFR
ALT: 16 IU/L (ref 0–32)
AST: 24 IU/L (ref 0–40)
Albumin/Globulin Ratio: 2 (ref 1.1–2.5)
Albumin: 4.3 g/dL (ref 3.6–4.8)
Alkaline Phosphatase: 119 IU/L — ABNORMAL HIGH (ref 39–117)
BUN/Creatinine Ratio: 18 (ref 11–26)
BUN: 14 mg/dL (ref 8–27)
CO2: 24 mmol/L (ref 18–29)
Calcium: 8.7 mg/dL (ref 8.7–10.3)
Chloride: 105 mmol/L (ref 97–108)
Creatinine, Ser: 0.76 mg/dL (ref 0.57–1.00)
GFR calc Af Amer: 95 mL/min/{1.73_m2} (ref >59)
GFR calc non Af Amer: 83 mL/min/{1.73_m2} (ref >59)
Globulin, Total: 2.2 g/dL (ref 1.5–4.5)
Glucose: 84 mg/dL (ref 65–99)
Potassium: 4.3 mmol/L (ref 3.5–5.2)
Sodium: 144 mmol/L (ref 134–144)
Total Bilirubin: <0.2 mg/dL (ref 0.0–1.2)
Total Protein: 6.5 g/dL (ref 6.0–8.5)

## 2014-01-18 LAB — NMR, LIPOPROFILE
Cholesterol: 156 mg/dL (ref 100–199)
HDL Cholesterol by NMR: 59 mg/dL (ref >39)
HDL Particle Number: 38.7 umol/L (ref >=30.5)
LDL Particle Number: 948 nmol/L (ref <1000)
LDL Size: 21.3 nm (ref >20.5)
LDLC SERPL CALC-MCNC: 89 mg/dL (ref 0–99)
LP-IR Score: <25 (ref <=45)
Small LDL Particle Number: 159 nmol/L (ref <=527)
Triglycerides by NMR: 40 mg/dL (ref 0–149)

## 2014-01-18 LAB — THYROID PANEL WITH TSH
Free Thyroxine Index: 2.2 (ref 1.2–4.9)
T3 Uptake Ratio: 29 % (ref 24–39)
T4, Total: 7.6 ug/dL (ref 4.5–12.0)
TSH: 1.84 u[IU]/mL (ref 0.450–4.500)

## 2014-01-21 ENCOUNTER — Telehealth: Payer: Self-pay | Admitting: Family Medicine

## 2014-01-21 NOTE — Telephone Encounter (Signed)
Message copied by Waverly Ferrari on Mon Jan 21, 2014 10:34 AM ------      Message from: Chevis Pretty      Created: Fri Jan 18, 2014  2:03 PM       Kidney and liver function stable      Cholesterol looks great      Thyroid normal      Continue current meds- low fat diet and exercise and recheck in 3 months       ------

## 2014-01-23 ENCOUNTER — Telehealth: Payer: Self-pay | Admitting: *Deleted

## 2014-01-23 DIAGNOSIS — F411 Generalized anxiety disorder: Secondary | ICD-10-CM

## 2014-01-23 NOTE — Telephone Encounter (Signed)
Lorazepam was written for TID prn #30.  Did you intend to write #90?  They are switching to [facility] and her father is meeting with them tomorrow to straighten out their medications.  Please route to nurse to call to [facility] if this needs to be corrected.

## 2014-01-24 ENCOUNTER — Ambulatory Visit: Payer: Medicaid Other | Admitting: Internal Medicine

## 2014-01-24 MED ORDER — LORAZEPAM 0.5 MG PO TABS: 0.5000 mg | ORAL_TABLET | Freq: Three times a day (TID) | ORAL | Status: DC | PRN

## 2014-01-24 NOTE — Telephone Encounter (Signed)
Phoned into [employer] Script shredded.

## 2014-01-24 NOTE — Telephone Encounter (Signed)
rx ready for pick up Corrected quanity on rx

## 2014-02-07 DIAGNOSIS — D0439 Carcinoma in situ of skin of other parts of face: Secondary | ICD-10-CM | POA: Diagnosis not present

## 2014-02-07 DIAGNOSIS — D485 Neoplasm of uncertain behavior of skin: Secondary | ICD-10-CM | POA: Diagnosis not present

## 2014-02-07 DIAGNOSIS — D0430 Carcinoma in situ of skin of unspecified part of face: Secondary | ICD-10-CM | POA: Diagnosis not present

## 2014-02-07 DIAGNOSIS — C44320 Squamous cell carcinoma of skin of unspecified parts of face: Secondary | ICD-10-CM | POA: Diagnosis not present

## 2014-02-28 ENCOUNTER — Other Ambulatory Visit: Payer: Medicare Other

## 2014-02-28 DIAGNOSIS — Z1212 Encounter for screening for malignant neoplasm of rectum: Secondary | ICD-10-CM | POA: Diagnosis not present

## 2014-03-01 LAB — FECAL OCCULT BLOOD, IMMUNOCHEMICAL: Fecal Occult Bld: NEGATIVE

## 2014-03-04 ENCOUNTER — Ambulatory Visit (INDEPENDENT_AMBULATORY_CARE_PROVIDER_SITE_OTHER): Payer: Medicare Other | Admitting: Pharmacist

## 2014-03-04 ENCOUNTER — Ambulatory Visit: Payer: Self-pay

## 2014-03-04 ENCOUNTER — Encounter: Payer: Self-pay | Admitting: Pharmacist

## 2014-03-04 VITALS — BP 108/68 | HR 78 | Ht 65.0 in | Wt 143.0 lb

## 2014-03-04 DIAGNOSIS — Z23 Encounter for immunization: Secondary | ICD-10-CM

## 2014-03-04 DIAGNOSIS — H9193 Unspecified hearing loss, bilateral: Secondary | ICD-10-CM

## 2014-03-04 DIAGNOSIS — Z0000 Encounter for general adult medical examination without abnormal findings: Secondary | ICD-10-CM | POA: Diagnosis not present

## 2014-03-04 NOTE — Progress Notes (Signed)
Subjective:    Renee Davidson is a 63 y.o. female who presents for Medicare Initial Wellness Visit  Preventive Screening-Counseling & Management  Tobacco History  Smoking status  . Never Smoker   Smokeless tobacco  . Never Used    Current Problems (verified) Patient Active Problem List   Diagnosis Date Noted  . Hypothyroidism 01/17/2014  . Hyperlipidemia LDL goal < 100 01/17/2014  . GAD (generalized anxiety disorder) 01/17/2014  . GERD (gastroesophageal reflux disease) 01/17/2014  . OCD (obsessive compulsive disorder) 01/17/2014  . Osteoporosis, post-menopausal 12/05/2013    Medications Prior to Visit Current Outpatient Prescriptions on File Prior to Visit  Medication Sig Dispense Refill  . alendronate (FOSAMAX) 70 MG tablet Take 1 tablet (70 mg total) by mouth every 7 (seven) days. Take with a full glass of water on an empty stomach.  12 tablet  1  . aspirin EC 81 MG tablet Take 81 mg by mouth daily.      . Calcium-Vitamin D-Vitamin K 500-100-40 MG-UNT-MCG CHEW Chew 1 tablet by mouth 2 (two) times daily.      Marland Kitchen escitalopram (LEXAPRO) 20 MG tablet Take 1 tablet (20 mg total) by mouth daily.  30 tablet  5  . levothyroxine (SYNTHROID, LEVOTHROID) 75 MCG tablet Take 1 tablet (75 mcg total) by mouth daily before breakfast.  30 tablet  5  . LORazepam (ATIVAN) 0.5 MG tablet Take 1 tablet (0.5 mg total) by mouth 3 (three) times daily as needed for anxiety.  90 tablet  1  . lovastatin (MEVACOR) 40 MG tablet Take 1 tablet (40 mg total) by mouth at bedtime.  30 tablet  5  . omeprazole (PRILOSEC) 20 MG capsule Take 1 capsule (20 mg total) by mouth daily.  30 capsule  5  . risperiDONE (RISPERDAL) 0.25 MG tablet One tablet in the morning and two tablets at bedtime  90 tablet  5   No current facility-administered medications on file prior to visit.    Current Medications (verified) Current Outpatient Prescriptions  Medication Sig Dispense Refill  . alendronate (FOSAMAX) 70 MG tablet Take 1  tablet (70 mg total) by mouth every 7 (seven) days. Take with a full glass of water on an empty stomach.  12 tablet  1  . aspirin EC 81 MG tablet Take 81 mg by mouth daily.      . Calcium-Vitamin D-Vitamin K 500-100-40 MG-UNT-MCG CHEW Chew 1 tablet by mouth 2 (two) times daily.      Marland Kitchen escitalopram (LEXAPRO) 20 MG tablet Take 1 tablet (20 mg total) by mouth daily.  30 tablet  5  . levothyroxine (SYNTHROID, LEVOTHROID) 75 MCG tablet Take 1 tablet (75 mcg total) by mouth daily before breakfast.  30 tablet  5  . LORazepam (ATIVAN) 0.5 MG tablet Take 1 tablet (0.5 mg total) by mouth 3 (three) times daily as needed for anxiety.  90 tablet  1  . lovastatin (MEVACOR) 40 MG tablet Take 1 tablet (40 mg total) by mouth at bedtime.  30 tablet  5  . omeprazole (PRILOSEC) 20 MG capsule Take 1 capsule (20 mg total) by mouth daily.  30 capsule  5  . risperiDONE (RISPERDAL) 0.25 MG tablet One tablet in the morning and two tablets at bedtime  90 tablet  5   No current facility-administered medications for this visit.     Allergies (verified) Review of patient's allergies indicates no known allergies.   PAST HISTORY  Family History Family History  Problem Relation Age of  Onset  . Diabetes Father   . Hyperlipidemia Father   . Hypertension Father   . Atrial fibrillation Father     Social History History  Substance Use Topics  . Smoking status: Never Smoker   . Smokeless tobacco: Never Used  . Alcohol Use: No     Are there smokers in your home (other than you)? No  Risk Factors Current exercise habits: Home exercise routine includes stationary bike - 1 hour daily.  Dietary issues discussed: none   Cardiac risk factors: advanced age (older than 64 for men, 8 for women), dyslipidemia and family history of premature cardiovascular disease.  Depression Screen (Note: if answer to either of the following is "Yes", a more complete depression screening is indicated)   Over the past 2 weeks, have you  felt down, depressed or hopeless? Yes  Over the past 2 weeks, have you felt little interest or pleasure in doing things? No  Have you lost interest or pleasure in daily life? No  Do you often feel hopeless? No  Do you cry easily over simple problems? No  Activities of Daily Living In your present state of health, do you have any difficulty performing the following activities?:  Driving? Yes - doesn't drive currently Managing money?  No Feeding yourself? No Getting from bed to chair? No  Climbing a flight of stairs? No Preparing food and eating?: Yes - father cooks Bathing or showering? No Getting dressed: No Getting to the toilet? No Using the toilet:No Moving around from place to place: No In the past year have you fallen or had a near fall?:No   Are you sexually active?  No  Do you have more than one partner?  No  Hearing Difficulties: sometimes Do you often ask people to speak up or repeat themselves? Yes Do you experience ringing or noises in your ears? No Do you have difficulty understanding soft or whispered voices? No   Do you feel that you have a problem with memory? No  Do you often misplace items? No  Do you feel safe at home?  Yes  Cognitive Testing  Alert? Yes  Normal Appearance?Yes  Oriented to person? Yes  Place? Yes   Time? Yes  Recall of three objects?  Yes  Can perform simple calculations? Yes  Displays appropriate judgment?Yes  Can read the correct time from a watch face?Yes   Advanced Directives have been discussed with the patient? Yes  List the Names of Other Physician/Practitioners you currently use: [facility] - has gone in past but not recently  Indicate any recent Medical Services you may have received from other than [facility] providers in the past year (date may be approximate).  Immunization History  Administered Date(s) Administered  . Influenza,inj,Quad PF,36+ Mos 05/18/2013  . Pneumococcal Conjugate-13 01/17/2014  .  Zoster 03/04/2014    Screening Tests Health Maintenance  Topic Date Due  . Influenza Vaccine  03/02/2014  . Zostavax  04/11/2014 (Originally 12/01/2008)  . Colon Cancer Screening Annual Fobt  04/19/2014 (Originally 12/02/1998)  . Colonoscopy  07/19/2014 (Originally 12/02/1998)  . Mammogram  11/28/2015  . Tetanus/tdap  05/02/2021  . Pneumococcal Polysaccharide Vaccine Age 20 And Over  Completed    All answers were reviewed with the patient and necessary referrals were made:  Cherre Robins, [facility]   03/04/2014   History reviewed: allergies, current medications, past family history, past medical history, past social history, past surgical history and problem list  Review of Systems Constitutional:  Negative  Eyes: positive for contacts/glasses Ears, nose, mouth, throat, and face: positive for HOH at times Respiratory: Negative Cardiovascular: Negative Gastrointestinal: positive for reflux symptoms - rarely;  Controlled with omeprazole Genitourinary: negative Integument/breast: negative   Hematologic/lymphatic: negative Musculoskeletal:positive for stiff joints - occassionally Neurological: positive for memory problems Behavioral/Psych: positive for anxiety and OCD Endocrine: negative  Allergic/Immunologic: negative  Objective:    Body mass index is 23.8 kg/(m^2). BP 108/68  Pulse 78  Ht 5\' 5"  (1.651 m)  Wt 143 lb (64.864 kg)  BMI 23.80 kg/m2   Assessment:   Medicare Initial Wellness Visit   Plan:     During the course of the visit the patient was educated and counseled about appropriate screening and preventive services including:    Pneumococcal vaccine   Influenza vaccine  Zostavax given today  Hepatitis B vaccine - declined  Td vaccine  Screening electrocardiogram  Screening mammography  Screening Pap smear and pelvic exam   Bone densitometry screening  Colorectal cancer screening  Glaucoma screening  Nutrition counseling   Advanced directives:  has Advanced Directive - asked to bring to office to put on file  Referral to audiologist for hearing evaluation  Diet review for nutrition referral? Yes ____  Not Indicated ___X _   Patient Instructions (the written plan) was given to the patient.  Medicare Attestation I have personally reviewed: The patient's medical and social history Their use of alcohol, tobacco or illicit drugs Their current medications and supplements The patient's functional ability including ADLs,fall risks, home safety risks, cognitive, and hearing and visual impairment Diet and physical activities Evidence for depression or mood disorders  The patient's weight, height, BMI, and HR / BP have been recorded in the chart.  I have made referrals, counseling, and provided education to the patient based on review of the above and I have provided the patient with a written personalized care plan for preventive services.     Cherre Robins, [facility]   03/04/2014

## 2014-03-04 NOTE — Patient Instructions (Addendum)
Maintenance Summary    INFLUENZA VACCINE Next Due 03/02/2014      COLON CANCER SCREENING ANNUAL FOBT Next Due  03/01/2015 Done 02/28/2014    COLONOSCOPY Postponed 07/19/2014 Originally 12/02/1998. Patient Declined   Pneumonia Vaccine Completed (Prevnar 13 01/17/14)    Dexa / Bone Density Next Due 12/16/2015 Last 12/15/2013    MAMMOGRAM Next Due 11/28/2015  Last done 11/27/2013   Zostavax / Shingles Vaccine Completed  Today 03/04/14     TETANUS/TDAP Next Due 05/02/2021  last done10/09/2010        Preventive Care for Adults A healthy lifestyle and preventive care can promote health and wellness. Preventive health guidelines for women include the following key practices.  A routine yearly physical is a good way to check with your health care provider about your health and preventive screening. It is a chance to share any concerns and updates on your health and to receive a thorough exam.  Visit your dentist for a routine exam and preventive care every 6 months. Brush your teeth twice a day and floss once a day. Good oral hygiene prevents tooth decay and gum disease.  The frequency of eye exams is based on your age, health, family medical history, use of contact lenses, and other factors. Follow your health care provider's recommendations for frequency of eye exams.  Eat a healthy diet. Foods like vegetables, fruits, whole grains, low-fat dairy products, and lean protein foods contain the nutrients you need without too many calories. Decrease your intake of foods high in solid fats, added sugars, and salt. Eat the right amount of calories for you.Get information about a proper diet from your health care provider, if necessary.  Regular physical exercise is one of the most important things you can do for your health. Most adults should get at least 150 minutes of moderate-intensity exercise (any activity that increases your heart rate and causes you to sweat) each week. In addition, most adults need  muscle-strengthening exercises on 2 or more days a week.  Maintain a healthy weight. The body mass index (BMI) is a screening tool to identify possible weight problems. It provides an estimate of body fat based on height and weight. Your health care provider can find your BMI and can help you achieve or maintain a healthy weight.For adults 20 years and older:  A BMI below 18.5 is considered underweight.  A BMI of 18.5 to 24.9 is normal.  A BMI of 25 to 29.9 is considered overweight.  A BMI of 30 and above is considered obese.  Maintain normal blood lipids and cholesterol levels by exercising and minimizing your intake of saturated fat. Eat a balanced diet with plenty of fruit and vegetables. Blood tests for lipids and cholesterol should begin at age 38 and be repeated every 5 years. If your lipid or cholesterol levels are high, you are over 50, or you are at high risk for heart disease, you may need your cholesterol levels checked more frequently.Ongoing high lipid and cholesterol levels should be treated with medicines if diet and exercise are not working.  If you smoke, find out from your health care provider how to quit. If you do not use tobacco, do not start.  Lung cancer screening is recommended for adults aged 39-80 years who are at high risk for developing lung cancer because of a history of smoking. A yearly low-dose CT scan of the lungs is recommended for people who have at least a 30-pack-year history of smoking and are a  current smoker or have quit within the past 15 years. A pack year of smoking is smoking an average of 1 pack of cigarettes a day for 1 year (for example: 1 pack a day for 30 years or 2 packs a day for 15 years). Yearly screening should continue until the smoker has stopped smoking for at least 15 years. Yearly screening should be stopped for people who develop a health problem that would prevent them from having lung cancer treatment.  If you are pregnant, do not  drink alcohol. If you are breastfeeding, be very cautious about drinking alcohol. If you are not pregnant and choose to drink alcohol, do not have more than 1 drink per day. One drink is considered to be 12 ounces (355 mL) of beer, 5 ounces (148 mL) of wine, or 1.5 ounces (44 mL) of liquor.  Avoid use of street drugs. Do not share needles with anyone. Ask for help if you need support or instructions about stopping the use of drugs.  High blood pressure causes heart disease and increases the risk of stroke. Your blood pressure should be checked at least every 1 to 2 years. Ongoing high blood pressure should be treated with medicines if weight loss and exercise do not work.  If you are 55-14 years old, ask your health care provider if you should take aspirin to prevent strokes.  Diabetes screening involves taking a blood sample to check your fasting blood sugar level. This should be done once every 3 years, after age 83, if you are within normal weight and without risk factors for diabetes. Testing should be considered at a younger age or be carried out more frequently if you are overweight and have at least 1 risk factor for diabetes.  Breast cancer screening is essential preventive care for women. You should practice "breast self-awareness." This means understanding the normal appearance and feel of your breasts and may include breast self-examination. Any changes detected, no matter how small, should be reported to a health care provider. Women in their 87s and 30s should have a clinical breast exam (CBE) by a health care provider as part of a regular health exam every 1 to 3 years. After age 53, women should have a CBE every year. Starting at age 73, women should consider having a mammogram (breast X-ray test) every year. Women who have a family history of breast cancer should talk to their health care provider about genetic screening. Women at a high risk of breast cancer should talk to their health  care providers about having an MRI and a mammogram every year.  Breast cancer gene (BRCA)-related cancer risk assessment is recommended for women who have family members with BRCA-related cancers. BRCA-related cancers include breast, ovarian, tubal, and peritoneal cancers. Having family members with these cancers may be associated with an increased risk for harmful changes (mutations) in the breast cancer genes BRCA1 and BRCA2. Results of the assessment will determine the need for genetic counseling and BRCA1 and BRCA2 testing.  Routine pelvic exams to screen for cancer are no longer recommended for nonpregnant women who are considered low risk for cancer of the pelvic organs (ovaries, uterus, and vagina) and who do not have symptoms. Ask your health care provider if a screening pelvic exam is right for you.  If you have had past treatment for cervical cancer or a condition that could lead to cancer, you need Pap tests and screening for cancer for at least 20 years after your treatment. If  Pap tests have been discontinued, your risk factors (such as having a new sexual partner) need to be reassessed to determine if screening should be resumed. Some women have medical problems that increase the chance of getting cervical cancer. In these cases, your health care provider may recommend more frequent screening and Pap tests.  The HPV test is an additional test that may be used for cervical cancer screening. The HPV test looks for the virus that can cause the cell changes on the cervix. The cells collected during the Pap test can be tested for HPV. The HPV test could be used to screen women aged 22 years and older, and should be used in women of any age who have unclear Pap test results. After the age of 62, women should have HPV testing at the same frequency as a Pap test.  Colorectal cancer can be detected and often prevented. Most routine colorectal cancer screening begins at the age of 36 years and  continues through age 49 years. However, your health care provider may recommend screening at an earlier age if you have risk factors for colon cancer. On a yearly basis, your health care provider may provide home test kits to check for hidden blood in the stool. Use of a small camera at the end of a tube, to directly examine the colon (sigmoidoscopy or colonoscopy), can detect the earliest forms of colorectal cancer. Talk to your health care provider about this at age 62, when routine screening begins. Direct exam of the colon should be repeated every 5-10 years through age 47 years, unless early forms of pre-cancerous polyps or small growths are found.  People who are at an increased risk for hepatitis B should be screened for this virus. You are considered at high risk for hepatitis B if:  You were born in a country where hepatitis B occurs often. Talk with your health care provider about which countries are considered high risk.  Your parents were born in a high-risk country and you have not received a shot to protect against hepatitis B (hepatitis B vaccine).  You have HIV or AIDS.  You use needles to inject street drugs.  You live with, or have sex with, someone who has hepatitis B.  You get hemodialysis treatment.  You take certain medicines for conditions like cancer, organ transplantation, and autoimmune conditions.  Hepatitis C blood testing is recommended for all people born from [birth year] through 1967 and any individual with known risks for hepatitis C.  Practice safe sex. Use condoms and avoid high-risk sexual practices to reduce the spread of sexually transmitted infections (STIs). STIs include gonorrhea, chlamydia, syphilis, trichomonas, herpes, HPV, and human immunodeficiency virus (HIV). Herpes, HIV, and HPV are viral illnesses that have no cure. They can result in disability, cancer, and death.  You should be screened for sexually transmitted illnesses (STIs) including gonorrhea  and chlamydia if:  You are sexually active and are younger than 24 years.  You are older than 24 years and your health care provider tells you that you are at risk for this type of infection.  Your sexual activity has changed since you were last screened and you are at an increased risk for chlamydia or gonorrhea. Ask your health care provider if you are at risk.  If you are at risk of being infected with HIV, it is recommended that you take a prescription medicine daily to prevent HIV infection. This is called preexposure prophylaxis (PrEP). You are considered at risk if:  You are a heterosexual woman, are sexually active, and are at increased risk for HIV infection.  You take drugs by injection.  You are sexually active with a partner who has HIV.  Talk with your health care provider about whether you are at high risk of being infected with HIV. If you choose to begin PrEP, you should first be tested for HIV. You should then be tested every 3 months for as long as you are taking PrEP.  Osteoporosis is a disease in which the bones lose minerals and strength with aging. This can result in serious bone fractures or breaks. The risk of osteoporosis can be identified using a bone density scan. Women ages 52 years and over and women at risk for fractures or osteoporosis should discuss screening with their health care providers. Ask your health care provider whether you should take a calcium supplement or vitamin D to reduce the rate of osteoporosis.  Menopause can be associated with physical symptoms and risks. Hormone replacement therapy is available to decrease symptoms and risks. You should talk to your health care provider about whether hormone replacement therapy is right for you.  Use sunscreen. Apply sunscreen liberally and repeatedly throughout the day. You should seek shade when your shadow is shorter than you. Protect yourself by wearing long sleeves, pants, a wide-brimmed hat, and  sunglasses year round, whenever you are outdoors.  Once a month, do a whole body skin exam, using a mirror to look at the skin on your back. Tell your health care provider of new moles, moles that have irregular borders, moles that are larger than a pencil eraser, or moles that have changed in shape or color.  Stay current with required vaccines (immunizations).  Influenza vaccine. All adults should be immunized every year.  Tetanus, diphtheria, and acellular pertussis (Td, Tdap) vaccine. Pregnant women should receive 1 dose of Tdap vaccine during each pregnancy. The dose should be obtained regardless of the length of time since the last dose. Immunization is preferred during the 27th-36th week of gestation. An adult who has not previously received Tdap or who does not know her vaccine status should receive 1 dose of Tdap. This initial dose should be followed by tetanus and diphtheria toxoids (Td) booster doses every 10 years. Adults with an unknown or incomplete history of completing a 3-dose immunization series with Td-containing vaccines should begin or complete a primary immunization series including a Tdap dose. Adults should receive a Td booster every 10 years.  Varicella vaccine. An adult without evidence of immunity to varicella should receive 2 doses or a second dose if she has previously received 1 dose. Pregnant females who do not have evidence of immunity should receive the first dose after pregnancy. This first dose should be obtained before leaving the health care facility. The second dose should be obtained 4-8 weeks after the first dose.  Human papillomavirus (HPV) vaccine. Females aged 13-26 years who have not received the vaccine previously should obtain the 3-dose series. The vaccine is not recommended for use in pregnant females. However, pregnancy testing is not needed before receiving a dose. If a female is found to be pregnant after receiving a dose, no treatment is needed. In that  case, the remaining doses should be delayed until after the pregnancy. Immunization is recommended for any person with an immunocompromised condition through the age of 68 years if she did not get any or all doses earlier. During the 3-dose series, the second dose should be obtained  4-8 weeks after the first dose. The third dose should be obtained 24 weeks after the first dose and 16 weeks after the second dose.  Zoster vaccine. One dose is recommended for adults aged 86 years or older unless certain conditions are present.  Measles, mumps, and rubella (MMR) vaccine. Adults born before [birth year] generally are considered immune to measles and mumps. Adults born in [birth year] or later should have 1 or more doses of MMR vaccine unless there is a contraindication to the vaccine or there is laboratory evidence of immunity to each of the three diseases. A routine second dose of MMR vaccine should be obtained at least 28 days after the first dose for students attending postsecondary schools, health care workers, or international travelers. People who received inactivated measles vaccine or an unknown type of measles vaccine during 1963-1967 should receive 2 doses of MMR vaccine. People who received inactivated mumps vaccine or an unknown type of mumps vaccine before 1979 and are at high risk for mumps infection should consider immunization with 2 doses of MMR vaccine. For females of childbearing age, rubella immunity should be determined. If there is no evidence of immunity, females who are not pregnant should be vaccinated. If there is no evidence of immunity, females who are pregnant should delay immunization until after pregnancy. Unvaccinated health care workers born before [birth year] who lack laboratory evidence of measles, mumps, or rubella immunity or laboratory confirmation of disease should consider measles and mumps immunization with 2 doses of MMR vaccine or rubella immunization with 1 dose of MMR vaccine.  Pneumococcal  13-valent conjugate (PCV13) vaccine. When indicated, a person who is uncertain of her immunization history and has no record of immunization should receive the PCV13 vaccine. An adult aged 38 years or older who has certain medical conditions and has not been previously immunized should receive 1 dose of PCV13 vaccine. This PCV13 should be followed with a dose of pneumococcal polysaccharide (PPSV23) vaccine. The PPSV23 vaccine dose should be obtained at least 8 weeks after the dose of PCV13 vaccine. An adult aged 48 years or older who has certain medical conditions and previously received 1 or more doses of PPSV23 vaccine should receive 1 dose of PCV13. The PCV13 vaccine dose should be obtained 1 or more years after the last PPSV23 vaccine dose.  Pneumococcal polysaccharide (PPSV23) vaccine. When PCV13 is also indicated, PCV13 should be obtained first. All adults aged 54 years and older should be immunized. An adult younger than age 47 years who has certain medical conditions should be immunized. Any person who resides in a nursing home or long-term care facility should be immunized. An adult smoker should be immunized. People with an immunocompromised condition and certain other conditions should receive both PCV13 and PPSV23 vaccines. People with human immunodeficiency virus (HIV) infection should be immunized as soon as possible after diagnosis. Immunization during chemotherapy or radiation therapy should be avoided. Routine use of PPSV23 vaccine is not recommended for American Indians, [place]atives, or people younger than 65 years unless there are medical conditions that require PPSV23 vaccine. When indicated, people who have unknown immunization and have no record of immunization should receive PPSV23 vaccine. One-time revaccination 5 years after the first dose of PPSV23 is recommended for people aged 19-64 years who have chronic kidney failure, nephrotic syndrome, asplenia, or immunocompromised conditions.  People who received 1-2 doses of PPSV23 before age 4 years should receive another dose of PPSV23 vaccine at age 36 years or later if at least 4  years have passed since the previous dose. Doses of PPSV23 are not needed for people immunized with PPSV23 at or after age 13 years.  Meningococcal vaccine. Adults with asplenia or persistent complement component deficiencies should receive 2 doses of quadrivalent meningococcal conjugate (MenACWY-D) vaccine. The doses should be obtained at least 2 months apart. Microbiologists working with certain meningococcal bacteria, [employer] recruits, people at risk during an outbreak, and people who travel to or live in countries with a high rate of meningitis should be immunized. A first-year college student up through age 42 years who is living in a residence hall should receive a dose if she did not receive a dose on or after her 16th birthday. Adults who have certain high-risk conditions should receive one or more doses of vaccine.  Hepatitis A vaccine. Adults who wish to be protected from this disease, have certain high-risk conditions, work with hepatitis A-infected animals, work in hepatitis A research labs, or travel to or work in countries with a high rate of hepatitis A should be immunized. Adults who were previously unvaccinated and who anticipate close contact with an international adoptee during the first 60 days after arrival in the [place] from a country with a high rate of hepatitis A should be immunized.  Hepatitis B vaccine. Adults who wish to be protected from this disease, have certain high-risk conditions, may be exposed to blood or other infectious body fluids, are household contacts or sex partners of hepatitis B positive people, are clients or workers in certain care facilities, or travel to or work in countries with a high rate of hepatitis B should be immunized.

## 2014-03-21 DIAGNOSIS — L570 Actinic keratosis: Secondary | ICD-10-CM | POA: Diagnosis not present

## 2014-03-21 DIAGNOSIS — Z85828 Personal history of other malignant neoplasm of skin: Secondary | ICD-10-CM | POA: Diagnosis not present

## 2014-05-02 DIAGNOSIS — Z85828 Personal history of other malignant neoplasm of skin: Secondary | ICD-10-CM | POA: Diagnosis not present

## 2014-05-02 DIAGNOSIS — L570 Actinic keratosis: Secondary | ICD-10-CM | POA: Diagnosis not present

## 2014-05-02 DIAGNOSIS — Z08 Encounter for follow-up examination after completed treatment for malignant neoplasm: Secondary | ICD-10-CM | POA: Diagnosis not present

## 2014-05-07 ENCOUNTER — Telehealth: Payer: Self-pay | Admitting: Nurse Practitioner

## 2014-05-07 ENCOUNTER — Ambulatory Visit (INDEPENDENT_AMBULATORY_CARE_PROVIDER_SITE_OTHER): Payer: Medicare Other

## 2014-05-07 DIAGNOSIS — Z23 Encounter for immunization: Secondary | ICD-10-CM | POA: Diagnosis not present

## 2014-05-07 NOTE — Telephone Encounter (Signed)
vm box not set up  

## 2014-05-07 NOTE — Telephone Encounter (Signed)
Get milk of magnesia and 6 oz of prune juice- repeat every 6 hours until get good results

## 2014-05-08 ENCOUNTER — Telehealth: Payer: Self-pay | Admitting: Nurse Practitioner

## 2014-05-08 NOTE — Telephone Encounter (Signed)
Aware of instructions to help with Renee Davidson's constipation.

## 2014-05-09 NOTE — Telephone Encounter (Signed)
Patient doing better

## 2014-07-09 ENCOUNTER — Other Ambulatory Visit: Payer: Self-pay | Admitting: Nurse Practitioner

## 2014-07-17 ENCOUNTER — Other Ambulatory Visit: Payer: Self-pay | Admitting: Nurse Practitioner

## 2014-07-31 ENCOUNTER — Other Ambulatory Visit: Payer: Self-pay | Admitting: Nurse Practitioner

## 2014-08-01 NOTE — Telephone Encounter (Signed)
Last seen 03/04/2014 and fast filled 01/24/2014

## 2014-08-02 NOTE — Telephone Encounter (Signed)
Please call in ativan with 0 refills 

## 2014-08-05 ENCOUNTER — Telehealth: Payer: Self-pay | Admitting: *Deleted

## 2014-08-05 NOTE — Telephone Encounter (Signed)
Ativan called to [place].

## 2014-08-07 DIAGNOSIS — L814 Other melanin hyperpigmentation: Secondary | ICD-10-CM | POA: Diagnosis not present

## 2014-08-07 DIAGNOSIS — C44319 Basal cell carcinoma of skin of other parts of face: Secondary | ICD-10-CM | POA: Diagnosis not present

## 2014-08-15 ENCOUNTER — Other Ambulatory Visit: Payer: Self-pay | Admitting: Family Medicine

## 2014-08-15 ENCOUNTER — Other Ambulatory Visit: Payer: Self-pay | Admitting: Nurse Practitioner

## 2014-08-15 NOTE — Telephone Encounter (Signed)
Last seen 01/17/14 MMM  Upcoming appt 09/11/14 MMM

## 2014-08-30 ENCOUNTER — Other Ambulatory Visit: Payer: Self-pay | Admitting: Nurse Practitioner

## 2014-08-31 ENCOUNTER — Other Ambulatory Visit: Payer: Self-pay | Admitting: Nurse Practitioner

## 2014-09-02 NOTE — Telephone Encounter (Signed)
Please advise on refill.  Last seen 01/17/14, has follow up appointment 09/11/14.

## 2014-09-02 NOTE — Telephone Encounter (Signed)
Please call in ativan with 1 refills 

## 2014-09-03 NOTE — Telephone Encounter (Signed)
rx called into pharmacy

## 2014-09-11 ENCOUNTER — Ambulatory Visit (INDEPENDENT_AMBULATORY_CARE_PROVIDER_SITE_OTHER): Payer: Medicare Other | Admitting: Nurse Practitioner

## 2014-09-11 ENCOUNTER — Encounter: Payer: Self-pay | Admitting: Nurse Practitioner

## 2014-09-11 VITALS — BP 110/66 | HR 59 | Temp 96.8°F | Ht 65.0 in | Wt 134.0 lb

## 2014-09-11 DIAGNOSIS — K219 Gastro-esophageal reflux disease without esophagitis: Secondary | ICD-10-CM

## 2014-09-11 DIAGNOSIS — M810 Age-related osteoporosis without current pathological fracture: Secondary | ICD-10-CM | POA: Diagnosis not present

## 2014-09-11 DIAGNOSIS — E785 Hyperlipidemia, unspecified: Secondary | ICD-10-CM

## 2014-09-11 DIAGNOSIS — F411 Generalized anxiety disorder: Secondary | ICD-10-CM

## 2014-09-11 DIAGNOSIS — E039 Hypothyroidism, unspecified: Secondary | ICD-10-CM

## 2014-09-11 MED ORDER — ALENDRONATE SODIUM 70 MG PO TABS: ORAL_TABLET | ORAL | Status: DC

## 2014-09-11 MED ORDER — ESCITALOPRAM OXALATE 20 MG PO TABS: 20.0000 mg | ORAL_TABLET | Freq: Every day | ORAL | Status: DC

## 2014-09-11 MED ORDER — LOVASTATIN 40 MG PO TABS: 40.0000 mg | ORAL_TABLET | Freq: Every day | ORAL | Status: DC

## 2014-09-11 MED ORDER — OMEPRAZOLE 20 MG PO CPDR
DELAYED_RELEASE_CAPSULE | ORAL | Status: DC
Start: 2014-09-11 — End: 2015-03-04

## 2014-09-11 MED ORDER — LEVOTHYROXINE SODIUM 75 MCG PO TABS: ORAL_TABLET | ORAL | Status: DC

## 2014-09-11 MED ORDER — RISPERIDONE 0.25 MG PO TABS: ORAL_TABLET | ORAL | Status: DC

## 2014-09-11 NOTE — Progress Notes (Signed)
Subjective:    Patient ID: Renee Davidson, female    DOB: [birth year], 64 y.o.   MRN: [id number]  Patient here today for follow up of chronic medical problems. Brought in by sister.  Hyperlipidemia This is a chronic problem. The current episode started more than 1 year ago. Recent lipid tests were reviewed and are variable. Pertinent negatives include no shortness of breath. Current antihyperlipidemic treatment includes statins. The current treatment provides moderate improvement of lipids. Compliance problems include adherence to diet and adherence to exercise.  Risk factors for coronary artery disease include hypertension.  Thyroid Problem Visit type: hypothyroidism. The symptoms have been stable. Her past medical history is significant for hyperlipidemia.  DEPRESSION AND ANXIETY Currently on lexapro and ativan- combination is working well. Sister says that this is the best that she has been in a long time. OCD Resperidol works well to keep her OCD under control- no side effects GERD Omeprazole works well to keep symptoms under control Osteoporosis Fosamax weekly- no side effects- gets some weight bearing exercises in 2-3 x a week.    Review of Systems  Constitutional: Negative.   HENT: Negative.   Respiratory: Negative for shortness of breath.   Cardiovascular: Negative.   Gastrointestinal: Negative.   Genitourinary: Negative.   Psychiatric/Behavioral: Negative.   All other systems reviewed and are negative.      Objective:   Physical Exam  Constitutional: She is oriented to person, place, and time. She appears well-developed and well-nourished.  HENT:  Nose: Nose normal.  Mouth/Throat: Oropharynx is clear and moist.  Eyes: EOM are normal.  Neck: Trachea normal, normal range of motion and full passive range of motion without pain. Neck supple. No JVD present. Carotid bruit is not present. No thyromegaly present.  Cardiovascular: Normal rate, regular rhythm, normal heart  sounds and intact distal pulses.  Exam reveals no gallop and no friction rub.   No murmur heard. Pulmonary/Chest: Effort normal and breath sounds normal.  Abdominal: Soft. Bowel sounds are normal. She exhibits no distension and no mass. There is no tenderness.  Musculoskeletal: Normal range of motion.  Lymphadenopathy:    She has no cervical adenopathy.  Neurological: She is alert and oriented to person, place, and time. She has normal reflexes.  Skin: Skin is warm and dry.  Psychiatric: She has a normal mood and affect. Her behavior is normal. Judgment and thought content normal.   BP 110/66 mmHg  Pulse 59  Temp(Src) 96.8 F (36 C) (Oral)  Ht '5\' 5"'  (1.651 m)  Wt 134 lb (60.782 kg)  BMI 22.30 kg/m2        Assessment & Plan:    1. Gastroesophageal reflux disease, esophagitis presence not specified Avoid spicy and fatty foods - omeprazole (PRILOSEC) 20 MG capsule; TAKE (1) CAPSULE DAILY  Dispense: 30 capsule; Refill: 5  2. Hypothyroidism, unspecified hypothyroidism type - levothyroxine (SYNTHROID, LEVOTHROID) 75 MCG tablet; TAKE (1) TABLET DAILY BE- FORE BREAKFAST.  Dispense: 30 tablet; Refill: 5  3. Osteoporosis, post-menopausal Weight bearing exercises - alendronate (FOSAMAX) 70 MG tablet; TAKE 1 TABLET EVERY 7 DAYS WITH A FULL GLASS OF WATER ON AN EMPTY STOMACH  Dispense: 12 tablet; Refill: 3  4. Hyperlipidemia with target LDL less than 100 Low fat diet - lovastatin (MEVACOR) 40 MG tablet; Take 1 tablet (40 mg total) by mouth at bedtime.  Dispense: 30 tablet; Refill: 5 - CMP14+EGFR - NMR, lipoprofile  5. GAD (generalized anxiety disorder) Stress management - escitalopram (LEXAPRO) 20 MG tablet;  Take 1 tablet (20 mg total) by mouth daily.  Dispense: 30 tablet; Refill: 5 - risperiDONE (RISPERDAL) 0.25 MG tablet; TAKE 1 TABLET IN THE MORNING AND 2 TABLETS AT BEDTIME  Dispense: 90 tablet; Refill: 5    Labs pending Health maintenance reviewed Diet and exercise  encouraged Continue all meds Follow up  In 3 months   [place], FNP

## 2014-09-11 NOTE — Patient Instructions (Signed)

## 2014-09-12 LAB — NMR, LIPOPROFILE
Cholesterol: 136 mg/dL (ref 100–199)
HDL Cholesterol by NMR: 54 mg/dL (ref >39)
HDL Particle Number: 32.9 umol/L (ref >=30.5)
LDL Particle Number: 739 nmol/L (ref <1000)
LDL Size: 21.4 nm (ref >20.5)
LDL-C: 71 mg/dL (ref 0–99)
LP-IR Score: <25 (ref <=45)
Small LDL Particle Number: 161 nmol/L (ref <=527)
Triglycerides by NMR: 56 mg/dL (ref 0–149)

## 2014-09-12 LAB — CMP14+EGFR
ALT: 11 IU/L (ref 0–32)
AST: 18 IU/L (ref 0–40)
Albumin/Globulin Ratio: 1.8 (ref 1.1–2.5)
Albumin: 4.2 g/dL (ref 3.6–4.8)
Alkaline Phosphatase: 75 IU/L (ref 39–117)
BUN/Creatinine Ratio: 8 — ABNORMAL LOW (ref 11–26)
BUN: 5 mg/dL — ABNORMAL LOW (ref 8–27)
Bilirubin Total: 0.3 mg/dL (ref 0.0–1.2)
CO2: 27 mmol/L (ref 18–29)
Calcium: 9.7 mg/dL (ref 8.7–10.3)
Chloride: 100 mmol/L (ref 97–108)
Creatinine, Ser: 0.64 mg/dL (ref 0.57–1.00)
GFR calc Af Amer: 108 mL/min/{1.73_m2} (ref >59)
GFR calc non Af Amer: 94 mL/min/{1.73_m2} (ref >59)
Globulin, Total: 2.3 g/dL (ref 1.5–4.5)
Glucose: 75 mg/dL (ref 65–99)
Potassium: 3.8 mmol/L (ref 3.5–5.2)
Sodium: 141 mmol/L (ref 134–144)
Total Protein: 6.5 g/dL (ref 6.0–8.5)

## 2014-10-03 DIAGNOSIS — Z85828 Personal history of other malignant neoplasm of skin: Secondary | ICD-10-CM | POA: Diagnosis not present

## 2014-10-03 DIAGNOSIS — Z08 Encounter for follow-up examination after completed treatment for malignant neoplasm: Secondary | ICD-10-CM | POA: Diagnosis not present

## 2014-10-30 ENCOUNTER — Other Ambulatory Visit: Payer: Self-pay | Admitting: Nurse Practitioner

## 2014-10-31 NOTE — Telephone Encounter (Signed)
Patient last seen in office on 09-11-14. Rx last filled on 09-02-14 for #90 with 1RF. Please advise

## 2014-10-31 NOTE — Telephone Encounter (Signed)
Please call in ativan with 1 refills 

## 2014-10-31 NOTE — Telephone Encounter (Signed)
Refill ativan

## 2014-10-31 NOTE — Telephone Encounter (Signed)
Last seen 09/11/14 MMM

## 2014-11-01 NOTE — Telephone Encounter (Signed)
rx called into pharmacy

## 2014-12-18 ENCOUNTER — Encounter: Payer: Self-pay | Admitting: Nurse Practitioner

## 2014-12-18 ENCOUNTER — Ambulatory Visit (INDEPENDENT_AMBULATORY_CARE_PROVIDER_SITE_OTHER): Payer: Medicare Other | Admitting: Nurse Practitioner

## 2014-12-18 VITALS — BP 120/77 | HR 58 | Temp 96.7°F | Ht 65.0 in | Wt 130.0 lb

## 2014-12-18 DIAGNOSIS — E039 Hypothyroidism, unspecified: Secondary | ICD-10-CM | POA: Diagnosis not present

## 2014-12-18 DIAGNOSIS — F42 Obsessive-compulsive disorder: Secondary | ICD-10-CM | POA: Diagnosis not present

## 2014-12-18 DIAGNOSIS — E785 Hyperlipidemia, unspecified: Secondary | ICD-10-CM | POA: Diagnosis not present

## 2014-12-18 DIAGNOSIS — F429 Obsessive-compulsive disorder, unspecified: Secondary | ICD-10-CM

## 2014-12-18 DIAGNOSIS — F411 Generalized anxiety disorder: Secondary | ICD-10-CM

## 2014-12-18 DIAGNOSIS — K219 Gastro-esophageal reflux disease without esophagitis: Secondary | ICD-10-CM

## 2014-12-18 MED ORDER — LORAZEPAM 0.5 MG PO TABS: ORAL_TABLET | ORAL | Status: DC

## 2014-12-18 NOTE — Progress Notes (Signed)
   Subjective:    Patient ID: Renee Davidson, female    DOB: 1950/11/18, 64 y.o.   MRN: [id number]  Patient here today for follow up of chronic medical problems. Brought in by sister.  Hyperlipidemia This is a chronic problem. The current episode started more than 1 year ago. Recent lipid tests were reviewed and are variable. Pertinent negatives include no shortness of breath. Current antihyperlipidemic treatment includes statins. The current treatment provides moderate improvement of lipids. Compliance problems include adherence to diet and adherence to exercise.  Risk factors for coronary artery disease include hypertension.  Thyroid Problem Visit type: hypothyroidism. The symptoms have been stable. Her past medical history is significant for hyperlipidemia.  DEPRESSION AND ANXIETY Currently on lexapro and ativan- combination is working well. Sister says that this is the best that she has been in a long time. OCD/GAD Resperidol works well to keep her OCD under control- no side effects.lorazepam works well for anxiety- no side effects GERD Omeprazole works well to keep symptoms under control Osteoporosis Fosamax weekly- no side effects- gets some weight bearing exercises in 2-3 x a week.    Review of Systems  Constitutional: Negative.   HENT: Negative.   Respiratory: Negative for shortness of breath.   Cardiovascular: Negative.   Gastrointestinal: Negative.   Genitourinary: Negative.   Psychiatric/Behavioral: Negative.   All other systems reviewed and are negative.      Objective:   Physical Exam  Constitutional: She is oriented to person, place, and time. She appears well-developed and well-nourished.  HENT:  Nose: Nose normal.  Mouth/Throat: Oropharynx is clear and moist.  Eyes: EOM are normal.  Neck: Trachea normal, normal range of motion and full passive range of motion without pain. Neck supple. No JVD present. Carotid bruit is not present. No thyromegaly present.    Cardiovascular: Normal rate, regular rhythm, normal heart sounds and intact distal pulses.  Exam reveals no gallop and no friction rub.   No murmur heard. Pulmonary/Chest: Effort normal and breath sounds normal.  Abdominal: Soft. Bowel sounds are normal. She exhibits no distension and no mass. There is no tenderness.  Musculoskeletal: Normal range of motion.  Lymphadenopathy:    She has no cervical adenopathy.  Neurological: She is alert and oriented to person, place, and time. She has normal reflexes.  Skin: Skin is warm and dry.  Psychiatric: She has a normal mood and affect. Her behavior is normal. Judgment and thought content normal.   BP 120/77 mmHg  Pulse 58  Temp(Src) 96.7 F (35.9 C) (Oral)  Ht $R'5\' 5"'kq$  (1.651 m)  Wt 130 lb (58.968 kg)  BMI 21.63 kg/m2        Assessment & Plan:   1. Gastroesophageal reflux disease, esophagitis presence not specified .mmge   2. Hypothyroidism, unspecified hypothyroidism type - Thyroid Panel With TSH  3. Hyperlipidemia with target LDL less than 100 Low fat diet - CMP14+EGFR - NMR, lipoprofile  4. GAD (generalized anxiety disorder) Stress management - LORazepam (ATIVAN) 0.5 MG tablet; TAKE 1 TABLET THREE TIMES DAILY AS NEEDED FOR ANXIETY  Dispense: 90 tablet; Refill: 1  5. OCD (obsessive compulsive disorder)     Labs pending Health maintenance reviewed Diet and exercise encouraged Continue all meds Follow up  In 6 months   [place], FNP

## 2014-12-18 NOTE — Patient Instructions (Signed)
Stress and Stress Management Stress is a normal reaction to life events. It is what you feel when life demands more than you are used to or more than you can handle. Some stress can be useful. For example, the stress reaction can help you catch the last bus of the day, study for a test, or meet a deadline at work. But stress that occurs too often or for too long can cause problems. It can affect your emotional health and interfere with relationships and normal daily activities. Too much stress can weaken your immune system and increase your risk for physical illness. If you already have a medical problem, stress can make it worse. CAUSES  All sorts of life events may cause stress. An event that causes stress for one person may not be stressful for another person. Major life events commonly cause stress. These may be positive or negative. Examples include losing your job, moving into a new home, getting married, having a baby, or losing a loved one. Less obvious life events may also cause stress, especially if they occur day after day or in combination. Examples include working long hours, driving in traffic, caring for children, being in debt, or being in a difficult relationship. SIGNS AND SYMPTOMS Stress may cause emotional symptoms including, the following:  Anxiety. This is feeling worried, afraid, on edge, overwhelmed, or out of control.  Anger. This is feeling irritated or impatient.  Depression. This is feeling sad, down, helpless, or guilty.  Difficulty focusing, remembering, or making decisions. Stress may cause physical symptoms, including the following:   Aches and pains. These may affect your head, neck, back, stomach, or other areas of your body.  Tight muscles or clenched jaw.  Low energy or trouble sleeping. Stress may cause unhealthy behaviors, including the following:   Eating to feel better (overeating) or skipping meals.  Sleeping too little, too much, or both.  Working  too much or putting off tasks (procrastination).  Smoking, drinking alcohol, or using drugs to feel better. DIAGNOSIS  Stress is diagnosed through an assessment by your health care provider. Your health care provider will ask questions about your symptoms and any stressful life events.Your health care provider will also ask about your medical history and may order blood tests or other tests. Certain medical conditions and medicine can cause physical symptoms similar to stress. Mental illness can cause emotional symptoms and unhealthy behaviors similar to stress. Your health care provider may refer you to a mental health professional for further evaluation.  TREATMENT  Stress management is the recommended treatment for stress.The goals of stress management are reducing stressful life events and coping with stress in healthy ways.  Techniques for reducing stressful life events include the following:  Stress identification. Self-monitor for stress and identify what causes stress for you. These skills may help you to avoid some stressful events.  Time management. Set your priorities, keep a calendar of events, and learn to say "no." These tools can help you avoid making too many commitments. Techniques for coping with stress include the following:  Rethinking the problem. Try to think realistically about stressful events rather than ignoring them or overreacting. Try to find the positives in a stressful situation rather than focusing on the negatives.  Exercise. Physical exercise can release both physical and emotional tension. The key is to find a form of exercise you enjoy and do it regularly.  Relaxation techniques. These relax the body and mind. Examples include yoga, meditation, tai chi, biofeedback, deep  breathing, progressive muscle relaxation, listening to music, being out in nature, journaling, and other hobbies. Again, the key is to find one or more that you enjoy and can do  regularly.  Healthy lifestyle. Eat a balanced diet, get plenty of sleep, and do not smoke. Avoid using alcohol or drugs to relax.  Strong support network. Spend time with family, friends, or other people you enjoy being around.Express your feelings and talk things over with someone you trust. Counseling or talktherapy with a mental health professional may be helpful if you are having difficulty managing stress on your own. Medicine is typically not recommended for the treatment of stress.Talk to your health care provider if you think you need medicine for symptoms of stress. HOME CARE INSTRUCTIONS  Keep all follow-up visits as directed by your health care provider.  Take all medicines as directed by your health care provider. SEEK MEDICAL CARE IF:  Your symptoms get worse or you start having new symptoms.  You feel overwhelmed by your problems and can no longer manage them on your own. SEEK IMMEDIATE MEDICAL CARE IF:  You feel like hurting yourself or someone else. Document Released: 01/12/2001 Document Revised: 12/03/2013 Document Reviewed: 03/13/2013 Exit[employer] Patient Information 2015 [employer]. This information is not intended to replace advice given to you by your health care provider. Make sure you discuss any questions you have with your health care provider.  

## 2014-12-19 LAB — CMP14+EGFR
ALT: 17 IU/L (ref 0–32)
AST: 19 IU/L (ref 0–40)
Albumin/Globulin Ratio: 2 (ref 1.1–2.5)
Albumin: 4.5 g/dL (ref 3.6–4.8)
Alkaline Phosphatase: 74 IU/L (ref 39–117)
BUN/Creatinine Ratio: 12 (ref 11–26)
BUN: 9 mg/dL (ref 8–27)
Bilirubin Total: 0.4 mg/dL (ref 0.0–1.2)
CO2: 25 mmol/L (ref 18–29)
Calcium: 10.2 mg/dL (ref 8.7–10.3)
Chloride: 100 mmol/L (ref 97–108)
Creatinine, Ser: 0.77 mg/dL (ref 0.57–1.00)
GFR calc Af Amer: 93 mL/min/{1.73_m2} (ref >59)
GFR calc non Af Amer: 81 mL/min/{1.73_m2} (ref >59)
Globulin, Total: 2.3 g/dL (ref 1.5–4.5)
Glucose: 69 mg/dL (ref 65–99)
Potassium: 4.2 mmol/L (ref 3.5–5.2)
Sodium: 140 mmol/L (ref 134–144)
Total Protein: 6.8 g/dL (ref 6.0–8.5)

## 2014-12-19 LAB — NMR, LIPOPROFILE
Cholesterol: 145 mg/dL (ref 100–199)
HDL Cholesterol by NMR: 55 mg/dL (ref >39)
HDL Particle Number: 37.4 umol/L (ref >=30.5)
LDL Particle Number: 807 nmol/L (ref <1000)
LDL Size: 22.1 nm (ref >20.5)
LDL-C: 80 mg/dL (ref 0–99)
LP-IR Score: <25 (ref <=45)
Small LDL Particle Number: <90 nmol/L (ref <=527)
Triglycerides by NMR: 49 mg/dL (ref 0–149)

## 2014-12-19 LAB — THYROID PANEL WITH TSH
Free Thyroxine Index: 3.1 (ref 1.2–4.9)
T3 Uptake Ratio: 32 % (ref 24–39)
T4, Total: 9.7 ug/dL (ref 4.5–12.0)
TSH: 1.26 u[IU]/mL (ref 0.450–4.500)

## 2015-01-15 DIAGNOSIS — Z85828 Personal history of other malignant neoplasm of skin: Secondary | ICD-10-CM | POA: Diagnosis not present

## 2015-01-15 DIAGNOSIS — Z08 Encounter for follow-up examination after completed treatment for malignant neoplasm: Secondary | ICD-10-CM | POA: Diagnosis not present

## 2015-02-26 ENCOUNTER — Other Ambulatory Visit: Payer: Self-pay | Admitting: Nurse Practitioner

## 2015-02-26 NOTE — Telephone Encounter (Signed)
Last filled 01/20/15, last seen 12/18/14. Nurse call into [facility]

## 2015-02-27 NOTE — Telephone Encounter (Signed)
Refill called to pharmacy.

## 2015-02-27 NOTE — Telephone Encounter (Signed)
Please call in ativan with 1 refills 

## 2015-03-04 ENCOUNTER — Other Ambulatory Visit: Payer: Self-pay | Admitting: Nurse Practitioner

## 2015-03-05 NOTE — Telephone Encounter (Signed)
Last seen 12/18/14  MMM

## 2015-03-14 ENCOUNTER — Other Ambulatory Visit: Payer: Self-pay | Admitting: Nurse Practitioner

## 2015-04-01 ENCOUNTER — Other Ambulatory Visit: Payer: Self-pay | Admitting: Nurse Practitioner

## 2015-04-15 ENCOUNTER — Other Ambulatory Visit: Payer: Self-pay | Admitting: Nurse Practitioner

## 2015-04-15 NOTE — Telephone Encounter (Signed)
Last seen 12/18/14  MMM 

## 2015-04-21 ENCOUNTER — Encounter: Payer: Self-pay | Admitting: *Deleted

## 2015-04-28 ENCOUNTER — Other Ambulatory Visit: Payer: Self-pay | Admitting: Nurse Practitioner

## 2015-04-29 NOTE — Telephone Encounter (Signed)
Last seen 12/18/14 MMM If approved route to nurse to call into [facility]

## 2015-04-29 NOTE — Telephone Encounter (Signed)
Please review and advise.

## 2015-04-30 NOTE — Telephone Encounter (Signed)
RX for Lorazepam called into [facility] per Dr Livia Snellen

## 2015-05-01 ENCOUNTER — Telehealth: Payer: Self-pay | Admitting: Nurse Practitioner

## 2015-05-06 ENCOUNTER — Ambulatory Visit (INDEPENDENT_AMBULATORY_CARE_PROVIDER_SITE_OTHER): Payer: Medicare Other | Admitting: *Deleted

## 2015-05-06 DIAGNOSIS — Z23 Encounter for immunization: Secondary | ICD-10-CM

## 2015-05-13 ENCOUNTER — Other Ambulatory Visit: Payer: Self-pay | Admitting: Nurse Practitioner

## 2015-05-15 ENCOUNTER — Ambulatory Visit: Payer: Self-pay

## 2015-05-19 ENCOUNTER — Ambulatory Visit (INDEPENDENT_AMBULATORY_CARE_PROVIDER_SITE_OTHER): Payer: Medicare Other | Admitting: Pediatrics

## 2015-05-19 ENCOUNTER — Encounter: Payer: Self-pay | Admitting: Pediatrics

## 2015-05-19 VITALS — BP 105/73 | HR 65 | Temp 97.0°F | Ht 65.0 in | Wt 140.8 lb

## 2015-05-19 DIAGNOSIS — Z0000 Encounter for general adult medical examination without abnormal findings: Secondary | ICD-10-CM

## 2015-05-19 NOTE — Progress Notes (Signed)
Subjective:   Renee Davidson is a 64 y.o. female who presents for an Initial Medicare Annual Wellness Visit.  Feeling well, no complaints Last two years has had problems with remembering Lives with father Rides stationary bike 1 hr every day Eating fruits   Review of Systems  All systems negative other than what is in HPI  Current Medications (verified) Outpatient Encounter Prescriptions as of 05/19/2015  Medication Sig  . alendronate (FOSAMAX) 70 MG tablet TAKE 1 TABLET EVERY 7 DAYS WITH A FULL GLASS OF WATER ON AN EMPTY STOMACH  . aspirin EC 81 MG tablet Take 81 mg by mouth daily.  . Calcium-Vitamin D-Vitamin K 500-100-40 MG-UNT-MCG CHEW Chew 1 tablet by mouth 2 (two) times daily.  Marland Kitchen escitalopram (LEXAPRO) 20 MG tablet TAKE 1 TABLET ONCE DAILY  . levothyroxine (SYNTHROID, LEVOTHROID) 75 MCG tablet TAKE (1) TABLET DAILY BE- FORE BREAKFAST.  Marland Kitchen LORazepam (ATIVAN) 0.5 MG tablet TAKE 1 TABLET THREE TIMES DAILY AS NEEDED FOR ANXIETY  . lovastatin (MEVACOR) 40 MG tablet Take 1 tablet (40 mg total) by mouth at bedtime.  Marland Kitchen omeprazole (PRILOSEC) 20 MG capsule TAKE (1) CAPSULE DAILY  . risperiDONE (RISPERDAL) 0.25 MG tablet TAKE 1 TABLET IN THE MORNING AND 2 TABLETS AT BEDTIME   No facility-administered encounter medications on file as of 05/19/2015.    Allergies (verified) Review of patient's allergies indicates no known allergies.   History: Past Medical History  Diagnosis Date  . Depression   . Anxiety   . Thyroid disease   . Hyperlipidemia   . Osteopenia   . OCD (obsessive compulsive disorder)   . Osteoporosis   . GERD (gastroesophageal reflux disease)    Past Surgical History  Procedure Laterality Date  . Tonsillectomy     Family History  Problem Relation Age of Onset  . Diabetes Father   . Hyperlipidemia Father   . Hypertension Father   . Atrial fibrillation Father    Social History   Occupational History  . Not on file.   Social History Main Topics  .  Smoking status: Never Smoker   . Smokeless tobacco: Never Used  . Alcohol Use: No  . Drug Use: No  . Sexual Activity: No    Do you feel safe at home?  Yes  Dietary issues and exercise activities: Current Exercise Habits:: Home exercise routine, Type of exercise: treadmill, Time (Minutes): 30, Frequency (Times/Week): > 6, Weekly Exercise (Minutes/Week): 0  Current Dietary habits:  Eating some fruits and vegetables   Objective:    Today's Vitals   05/19/15 1213  BP: 105/73  Pulse: 65  Temp: 97 F (36.1 C)  TempSrc: Oral  Height: 5\' 5"  (1.651 m)  Weight: 140 lb 12.8 oz (63.866 kg)   Body mass index is 23.43 kg/(m^2).  Activities of Daily Living In your present state of health, do you have any difficulty performing the following activities: 05/19/2015 09/11/2014  Hearing? N N  Vision? N N  Difficulty concentrating or making decisions? N N  Walking or climbing stairs? N N  Dressing or bathing? N N  Doing errands, shopping? N N  Preparing Food and eating ? N -  Using the Toilet? N -  In the past six months, have you accidently leaked urine? N -  Do you have problems with loss of bowel control? N -  Managing your Medications? N -  Managing your Finances? N -  Housekeeping or managing your Housekeeping? N -  Are there smokers in your home (other than you)? No   Cardiac Risk Factors include: advanced age (>42men, >30 women)  Depression Screen PHQ 2/9 Scores 05/19/2015 12/18/2014 09/11/2014 03/04/2014  PHQ - 2 Score 0 0 0 2  PHQ- 9 Score - - - 2    Fall Risk Fall Risk  05/19/2015 12/18/2014 09/11/2014 03/04/2014 01/17/2014  Falls in the past year? No No No No No    Cognitive Function: MMSE - Mini Mental State Exam 05/19/2015  Orientation to time 2  Orientation to Place 1  Registration 3  Attention/ Calculation 4  Recall 0  Language- name 2 objects 2  Language- repeat 1  Language- follow 3 step command 3  Language- read & follow direction 1  Write a sentence 1    Copy design 0  Total score 18    Immunizations and Health Maintenance Immunization History  Administered Date(s) Administered  . Influenza,inj,Quad PF,36+ Mos 05/18/2013, 05/07/2014, 05/06/2015  . Pneumococcal Conjugate-13 01/17/2014  . Zoster 03/04/2014   Health Maintenance Due  Topic Date Due  . Hepatitis C Screening  February 09, 1951  . COLONOSCOPY  12/02/1998  . PNA vac Low Risk Adult (2 of 2 - PPSV23) 01/18/2015  . COLON CANCER SCREENING ANNUAL FOBT  03/01/2015    Patient Care Team: Chevis Pretty, FNP as PCP - General (Nurse Practitioner)  Indicate any recent Medical Services you may have received from other than [facility] providers in the past year (date may be approximate).    Assessment:    Annual Wellness Visit    Screening Tests Health Maintenance  Topic Date Due  . Hepatitis C Screening  [birth year]  . COLONOSCOPY  12/02/1998  . PNA vac Low Risk Adult (2 of 2 - PPSV23) 01/18/2015  . COLON CANCER SCREENING ANNUAL FOBT  03/01/2015  . MAMMOGRAM  11/28/2015  . INFLUENZA VACCINE  03/02/2016  . TETANUS/TDAP  05/02/2021  . DEXA SCAN  Completed  . ZOSTAVAX  Completed        Plan:   During the course of the visit [name] was educated and counseled about the following appropriate screening and preventive services:   Vaccines UTD  Colorectal cancer screening--declined colonoscopy, gave FOBT card today, has never had a colonoscopy  Cardiovascular disease screening--UTD  Diabetes screening--UTD  Bone Denisty / Osteoporosis Screening--UTD  Mammogram--UTD  PAP--pt declined, has never had an abnormal  Glaucoma screenin--due, pt to call  Nutrition counseling--completed  Advanced Directives--father is who she would like to make her decisions if she were not able to   Goals    None       Patient Instructions (the written plan) were given to the patient.   [name], MD   05/19/2015

## 2015-05-26 ENCOUNTER — Other Ambulatory Visit: Payer: Self-pay | Admitting: Nurse Practitioner

## 2015-05-27 NOTE — Telephone Encounter (Signed)
Prescribed by Shelah Lewandowsky, forwarding to her.

## 2015-05-27 NOTE — Telephone Encounter (Signed)
Last filled 04/30/15, last seen 05/19/15. Route to pool, nurse to call in

## 2015-05-29 NOTE — Telephone Encounter (Signed)
Called into [facility].  

## 2015-05-29 NOTE — Telephone Encounter (Signed)
Please call in ativan with 1 refills 

## 2015-06-10 ENCOUNTER — Other Ambulatory Visit: Payer: Self-pay | Admitting: Nurse Practitioner

## 2015-06-19 ENCOUNTER — Other Ambulatory Visit: Payer: Self-pay | Admitting: Nurse Practitioner

## 2015-06-20 ENCOUNTER — Ambulatory Visit: Payer: Medicare Other | Admitting: Nurse Practitioner

## 2015-06-23 ENCOUNTER — Ambulatory Visit: Payer: Medicare Other | Admitting: Nurse Practitioner

## 2015-07-01 ENCOUNTER — Ambulatory Visit (INDEPENDENT_AMBULATORY_CARE_PROVIDER_SITE_OTHER): Payer: Medicare Other | Admitting: Nurse Practitioner

## 2015-07-01 ENCOUNTER — Encounter: Payer: Self-pay | Admitting: Nurse Practitioner

## 2015-07-01 VITALS — BP 109/72 | HR 72 | Temp 96.8°F | Ht 65.0 in | Wt 148.0 lb

## 2015-07-01 DIAGNOSIS — M810 Age-related osteoporosis without current pathological fracture: Secondary | ICD-10-CM | POA: Diagnosis not present

## 2015-07-01 DIAGNOSIS — E039 Hypothyroidism, unspecified: Secondary | ICD-10-CM | POA: Diagnosis not present

## 2015-07-01 DIAGNOSIS — F411 Generalized anxiety disorder: Secondary | ICD-10-CM | POA: Diagnosis not present

## 2015-07-01 DIAGNOSIS — F429 Obsessive-compulsive disorder, unspecified: Secondary | ICD-10-CM

## 2015-07-01 DIAGNOSIS — K219 Gastro-esophageal reflux disease without esophagitis: Secondary | ICD-10-CM | POA: Diagnosis not present

## 2015-07-01 DIAGNOSIS — Z23 Encounter for immunization: Secondary | ICD-10-CM | POA: Diagnosis not present

## 2015-07-01 DIAGNOSIS — G4700 Insomnia, unspecified: Secondary | ICD-10-CM

## 2015-07-01 DIAGNOSIS — E785 Hyperlipidemia, unspecified: Secondary | ICD-10-CM

## 2015-07-01 MED ORDER — LORAZEPAM 0.5 MG PO TABS: ORAL_TABLET | ORAL | Status: DC

## 2015-07-01 MED ORDER — OMEPRAZOLE 20 MG PO CPDR: DELAYED_RELEASE_CAPSULE | ORAL | Status: DC

## 2015-07-01 MED ORDER — RISPERIDONE 0.25 MG PO TABS: ORAL_TABLET | ORAL | Status: DC

## 2015-07-01 MED ORDER — LEVOTHYROXINE SODIUM 75 MCG PO TABS: ORAL_TABLET | ORAL | Status: DC

## 2015-07-01 MED ORDER — LOVASTATIN 40 MG PO TABS: ORAL_TABLET | ORAL | Status: DC

## 2015-07-01 MED ORDER — ESCITALOPRAM OXALATE 20 MG PO TABS: 20.0000 mg | ORAL_TABLET | Freq: Every day | ORAL | Status: DC

## 2015-07-01 NOTE — Patient Instructions (Signed)
Bone Health Bones protect organs, store calcium, and anchor muscles. Good health habits, such as eating nutritious foods and exercising regularly, are important for maintaining healthy bones. They can also help to prevent a condition that causes bones to lose density and become weak and brittle (osteoporosis). WHY IS BONE MASS IMPORTANT? Bone mass refers to the amount of bone tissue that you have. The higher your bone mass, the stronger your bones. An important step toward having healthy bones throughout life is to have strong and dense bones during childhood. A young adult who has a high bone mass is more likely to have a high bone mass later in life. Bone mass at its greatest it is called peak bone mass. A large decline in bone mass occurs in older adults. In women, it occurs about the time of menopause. During this time, it is important to practice good health habits, because if more bone is lost than what is replaced, the bones will become less healthy and more likely to break (fracture). If you find that you have a low bone mass, you may be able to prevent osteoporosis or further bone loss by changing your diet and lifestyle. HOW CAN I FIND OUT IF MY BONE MASS IS LOW? Bone mass can be measured with an X-ray test that is called a bone mineral density (BMD) test. This test is recommended for all women who are age 65 or older. It may also be recommended for men who are age 70 or older, or for people who are more likely to develop osteoporosis due to:  Having bones that break easily.  Having a long-term disease that weakens bones, such as kidney disease or rheumatoid arthritis.  Having menopause earlier than normal.  Taking medicine that weakens bones, such as steroids, thyroid hormones, or hormone treatment for breast cancer or prostate cancer.  Smoking.  Drinking three or more alcoholic drinks each day. WHAT ARE THE NUTRITIONAL RECOMMENDATIONS FOR HEALTHY BONES? To have healthy bones, you need  to get enough of the right minerals and vitamins. Most nutrition experts recommend getting these nutrients from the foods that you eat. Nutritional recommendations vary from person to person. Ask your health care provider what is healthy for you. Here are some general guidelines. Calcium Recommendations Calcium is the most important (essential) mineral for bone health. Most people can get enough calcium from their diet, but supplements may be recommended for people who are at risk for osteoporosis. Good sources of calcium include:  Dairy products, such as low-fat or nonfat milk, cheese, and yogurt.  Dark green leafy vegetables, such as bok choy and broccoli.  Calcium-fortified foods, such as orange juice, cereal, bread, soy beverages, and tofu products.  Nuts, such as almonds. Follow these recommended amounts for daily calcium intake:  Children, age 1-3: 700 mg.  Children, age 4-8: 1,000 mg.  Children, age 9-13: 1,300 mg.  Teens, age 14-18: 1,300 mg.  Adults, age 19-50: 1,000 mg.  Adults, age 51-70:  Men: 1,000 mg.  Women: 1,200 mg.  Adults, age 71 or older: 1,200 mg.  Pregnant and breastfeeding females:  Teens: 1,300 mg.  Adults: 1,000 mg. Vitamin D Recommendations Vitamin D is the most essential vitamin for bone health. It helps the body to absorb calcium. Sunlight stimulates the skin to make vitamin D, so be sure to get enough sunlight. If you live in a cold climate or you do not get outside often, your health care provider may recommend that you take vitamin D supplements. Good   sources of vitamin D in your diet include:  Egg yolks.  Saltwater fish.  Milk and cereal fortified with vitamin D. Follow these recommended amounts for daily vitamin D intake:  Children and teens, age 1-18: 600 international units.  Adults, age 50 or younger: 400-800 international units.  Adults, age 51 or older: 800-1,000 international units. Other Nutrients Other nutrients for bone  health include:  Phosphorus. This mineral is found in meat, poultry, dairy foods, nuts, and legumes. The recommended daily intake for adult men and adult women is 700 mg.  Magnesium. This mineral is found in seeds, nuts, dark green vegetables, and legumes. The recommended daily intake for adult men is 400-420 mg. For adult women, it is 310-320 mg.  Vitamin K. This vitamin is found in green leafy vegetables. The recommended daily intake is 120 mg for adult men and 90 mg for adult women. WHAT TYPE OF PHYSICAL ACTIVITY IS BEST FOR BUILDING AND MAINTAINING HEALTHY BONES? Weight-bearing and strength-building activities are important for building and maintaining peak bone mass. Weight-bearing activities cause muscles and bones to work against gravity. Strength-building activities increases muscle strength that supports bones. Weight-bearing and muscle-building activities include:  Walking and hiking.  Jogging and running.  Dancing.  Gym exercises.  Lifting weights.  Tennis and racquetball.  Climbing stairs.  Aerobics. Adults should get at least 30 minutes of moderate physical activity on most days. Children should get at least 60 minutes of moderate physical activity on most days. Ask your health care provide what type of exercise is best for you. WHERE CAN I FIND MORE INFORMATION? For more information, check out the following websites:  National Osteoporosis Foundation: http://nof.org/learn/basics  National Institutes of Health: http://www.niams.nih.gov/Health_Info/Bone/Bone_Health/bone_health_for_life.asp   This information is not intended to replace advice given to you by your health care provider. Make sure you discuss any questions you have with your health care provider.   Document Released: 10/09/2003 Document Revised: 12/03/2014 Document Reviewed: 07/24/2014 Elsevier Interactive Patient Education 2016 [employer]  

## 2015-07-01 NOTE — Progress Notes (Signed)
Subjective:    Patient ID: Renee Davidson, female    DOB: [birth year], 64 y.o.   MRN: [id number]  Patient here today for follow up of chronic medical problems.  Hyperlipidemia This is a chronic problem. The current episode started more than 1 year ago. Recent lipid tests were reviewed and are variable. Pertinent negatives include no shortness of breath. Current antihyperlipidemic treatment includes statins. The current treatment provides moderate improvement of lipids. Compliance problems include adherence to diet and adherence to exercise.  Risk factors for coronary artery disease include hypertension.  Thyroid Problem Visit type: hypothyroidism. The symptoms have been stable. Her past medical history is significant for hyperlipidemia.  DEPRESSION AND ANXIETY Currently on lexapro and ativan- combination is working well. Sister says that this is the best that she has been in a long time. OCD/GAD Resperidol works well to keep her OCD under control- no side effects.lorazepam works well for anxiety- no side effects GERD Omeprazole works well to keep symptoms under control Osteoporosis Fosamax weekly- no side effects- gets some weight bearing exercises in 2-3 x a week.    Review of Systems  Constitutional: Negative.   HENT: Negative.   Respiratory: Negative for shortness of breath.   Cardiovascular: Negative.   Gastrointestinal: Negative.   Genitourinary: Negative.   Psychiatric/Behavioral: Negative.   All other systems reviewed and are negative.      Objective:   Physical Exam  Constitutional: She is oriented to person, place, and time. She appears well-developed and well-nourished.  HENT:  Nose: Nose normal.  Mouth/Throat: Oropharynx is clear and moist.  Eyes: EOM are normal.  Neck: Trachea normal, normal range of motion and full passive range of motion without pain. Neck supple. No JVD present. Carotid bruit is not present. No thyromegaly present.  Cardiovascular: Normal rate,  regular rhythm, normal heart sounds and intact distal pulses.  Exam reveals no gallop and no friction rub.   No murmur heard. Pulmonary/Chest: Effort normal and breath sounds normal.  Abdominal: Soft. Bowel sounds are normal. She exhibits no distension and no mass. There is no tenderness.  Musculoskeletal: Normal range of motion.  Lymphadenopathy:    She has no cervical adenopathy.  Neurological: She is alert and oriented to person, place, and time. She has normal reflexes.  Skin: Skin is warm and dry.  Psychiatric: She has a normal mood and affect. Her behavior is normal. Judgment and thought content normal.   BP 109/72 mmHg  Pulse 72  Temp(Src) 96.8 F (36 C) (Oral)  Ht 5' 5" (1.651 m)  Wt 148 lb (67.132 kg)  BMI 24.63 kg/m2        Assessment & Plan:  1. Gastroesophageal reflux disease, esophagitis presence not specified Avoid spicy foods Do not eat 2 hours prior to bedtime - omeprazole (PRILOSEC) 20 MG capsule; TAKE (1) CAPSULE DAILY  Dispense: 30 capsule; Refill: 5  2. Hypothyroidism, unspecified hypothyroidism type - levothyroxine (SYNTHROID, LEVOTHROID) 75 MCG tablet; TAKE (1) TABLET DAILY BE- FORE BREAKFAST.  Dispense: 30 tablet; Refill: 5 - Thyroid Panel With TSH  3. Hyperlipidemia with target LDL less than 100 Low fat diet - CMP14+EGFR - Lipid panel - lovastatin (MEVACOR) 40 MG tablet; TAKE 1 TABLET ONCE DAILY AT BEDTIME  Dispense: 30 tablet; Refill: 5  4. GAD (generalized anxiety disorder) Stress management - LORazepam (ATIVAN) 0.5 MG tablet; TAKE 1 TABLET THREE TIMES DAILY AS NEEDED FOR ANXIETY  Dispense: 90 tablet; Refill: 1  5. OCD (obsessive compulsive disorder) - escitalopram (LEXAPRO) 20  MG tablet; Take 1 tablet (20 mg total) by mouth daily.  Dispense: 30 tablet; Refill: 5  6. Osteoporosis, post-menopausal Weight bearing exercises  7. Insomnia - risperiDONE (RISPERDAL) 0.25 MG tablet; TAKE 1 TABLET IN THE MORNING AND 2 TABLETS AT BEDTIME  Dispense: 90  tablet; Refill: 5  Counseling done on all immunizations given today- pneumonia hemoccult cards given to patient with directions Labs pending Health maintenance reviewed Diet and exercise encouraged Continue all meds Follow up  In 3 months   Jaydn-Margaret , FNP     

## 2015-07-01 NOTE — Addendum Note (Signed)
Addended by: Rolena Infante on: 07/01/2015 03:10 PM   Modules accepted: Orders

## 2015-07-02 LAB — CMP14+EGFR
ALT: 41 IU/L — ABNORMAL HIGH (ref 0–32)
AST: 40 IU/L (ref 0–40)
Albumin/Globulin Ratio: 1.8 (ref 1.1–2.5)
Albumin: 4 g/dL (ref 3.6–4.8)
Alkaline Phosphatase: 86 IU/L (ref 39–117)
BUN/Creatinine Ratio: 23 (ref 11–26)
BUN: 16 mg/dL (ref 8–27)
Bilirubin Total: 0.2 mg/dL (ref 0.0–1.2)
CO2: 25 mmol/L (ref 18–29)
Calcium: 9 mg/dL (ref 8.7–10.3)
Chloride: 104 mmol/L (ref 97–106)
Creatinine, Ser: 0.71 mg/dL (ref 0.57–1.00)
GFR calc Af Amer: 103 mL/min/{1.73_m2} (ref >59)
GFR calc non Af Amer: 89 mL/min/{1.73_m2} (ref >59)
Globulin, Total: 2.2 g/dL (ref 1.5–4.5)
Glucose: 89 mg/dL (ref 65–99)
Potassium: 4.5 mmol/L (ref 3.5–5.2)
Sodium: 140 mmol/L (ref 136–144)
Total Protein: 6.2 g/dL (ref 6.0–8.5)

## 2015-07-02 LAB — LIPID PANEL
Chol/HDL Ratio: 2.6 ratio units (ref 0.0–4.4)
Cholesterol, Total: 150 mg/dL (ref 100–199)
HDL: 58 mg/dL (ref >39)
LDL Calculated: 84 mg/dL (ref 0–99)
Triglycerides: 39 mg/dL (ref 0–149)
VLDL Cholesterol Cal: 8 mg/dL (ref 5–40)

## 2015-07-02 LAB — THYROID PANEL WITH TSH
Free Thyroxine Index: 2.3 (ref 1.2–4.9)
T3 Uptake Ratio: 28 % (ref 24–39)
T4, Total: 8.3 ug/dL (ref 4.5–12.0)
TSH: 1.58 u[IU]/mL (ref 0.450–4.500)

## 2015-07-16 DIAGNOSIS — L570 Actinic keratosis: Secondary | ICD-10-CM | POA: Diagnosis not present

## 2015-07-16 DIAGNOSIS — D170 Benign lipomatous neoplasm of skin and subcutaneous tissue of head, face and neck: Secondary | ICD-10-CM | POA: Diagnosis not present

## 2015-07-16 DIAGNOSIS — Z85828 Personal history of other malignant neoplasm of skin: Secondary | ICD-10-CM | POA: Diagnosis not present

## 2015-07-21 ENCOUNTER — Telehealth: Payer: Self-pay | Admitting: Nurse Practitioner

## 2015-08-05 DIAGNOSIS — Z1231 Encounter for screening mammogram for malignant neoplasm of breast: Secondary | ICD-10-CM | POA: Diagnosis not present

## 2015-08-05 LAB — HM MAMMOGRAPHY: HM Mammogram: NEGATIVE

## 2015-08-08 ENCOUNTER — Encounter: Payer: Self-pay | Admitting: *Deleted

## 2015-09-03 ENCOUNTER — Other Ambulatory Visit: Payer: Medicare Other

## 2015-09-03 DIAGNOSIS — Z1212 Encounter for screening for malignant neoplasm of rectum: Secondary | ICD-10-CM

## 2015-09-07 LAB — FECAL OCCULT BLOOD, IMMUNOCHEMICAL: Fecal Occult Bld: NEGATIVE

## 2015-09-30 ENCOUNTER — Encounter: Payer: Self-pay | Admitting: Nurse Practitioner

## 2015-09-30 ENCOUNTER — Ambulatory Visit (INDEPENDENT_AMBULATORY_CARE_PROVIDER_SITE_OTHER): Payer: Medicare Other

## 2015-09-30 ENCOUNTER — Ambulatory Visit (INDEPENDENT_AMBULATORY_CARE_PROVIDER_SITE_OTHER): Payer: Medicare Other | Admitting: Nurse Practitioner

## 2015-09-30 ENCOUNTER — Other Ambulatory Visit: Payer: Self-pay | Admitting: Nurse Practitioner

## 2015-09-30 VITALS — BP 109/67 | HR 66 | Temp 96.8°F | Ht 65.0 in | Wt 149.0 lb

## 2015-09-30 DIAGNOSIS — F411 Generalized anxiety disorder: Secondary | ICD-10-CM

## 2015-09-30 DIAGNOSIS — E039 Hypothyroidism, unspecified: Secondary | ICD-10-CM

## 2015-09-30 DIAGNOSIS — M810 Age-related osteoporosis without current pathological fracture: Secondary | ICD-10-CM | POA: Diagnosis not present

## 2015-09-30 DIAGNOSIS — G4700 Insomnia, unspecified: Secondary | ICD-10-CM | POA: Diagnosis not present

## 2015-09-30 DIAGNOSIS — K219 Gastro-esophageal reflux disease without esophagitis: Secondary | ICD-10-CM | POA: Diagnosis not present

## 2015-09-30 DIAGNOSIS — F429 Obsessive-compulsive disorder, unspecified: Secondary | ICD-10-CM | POA: Diagnosis not present

## 2015-09-30 DIAGNOSIS — Z1159 Encounter for screening for other viral diseases: Secondary | ICD-10-CM | POA: Diagnosis not present

## 2015-09-30 DIAGNOSIS — E785 Hyperlipidemia, unspecified: Secondary | ICD-10-CM | POA: Diagnosis not present

## 2015-09-30 MED ORDER — LORAZEPAM 0.5 MG PO TABS: ORAL_TABLET | ORAL | Status: DC

## 2015-09-30 MED ORDER — LEVOTHYROXINE SODIUM 75 MCG PO TABS: ORAL_TABLET | ORAL | Status: DC

## 2015-09-30 MED ORDER — ESCITALOPRAM OXALATE 20 MG PO TABS: 20.0000 mg | ORAL_TABLET | Freq: Every day | ORAL | Status: DC

## 2015-09-30 MED ORDER — ALENDRONATE SODIUM 70 MG PO TABS: ORAL_TABLET | ORAL | Status: DC

## 2015-09-30 MED ORDER — OMEPRAZOLE 20 MG PO CPDR: DELAYED_RELEASE_CAPSULE | ORAL | Status: DC

## 2015-09-30 MED ORDER — LOVASTATIN 40 MG PO TABS: ORAL_TABLET | ORAL | Status: DC

## 2015-09-30 MED ORDER — RISPERIDONE 0.25 MG PO TABS: ORAL_TABLET | ORAL | Status: DC

## 2015-09-30 NOTE — Patient Instructions (Signed)
Health Maintenance, Female Adopting a healthy lifestyle and getting preventive care can go a long way to promote health and wellness. Talk with your health care provider about what schedule of regular examinations is right for you. This is a good chance for you to check in with your provider about disease prevention and staying healthy. In between checkups, there are plenty of things you can do on your own. Experts have done a lot of research about which lifestyle changes and preventive measures are most likely to keep you healthy. Ask your health care provider for more information. WEIGHT AND DIET  Eat a healthy diet  Be sure to include plenty of vegetables, fruits, low-fat dairy products, and lean protein.  Do not eat a lot of foods high in solid fats, added sugars, or salt.  Get regular exercise. This is one of the most important things you can do for your health.  Most adults should exercise for at least 150 minutes each week. The exercise should increase your heart rate and make you sweat (moderate-intensity exercise).  Most adults should also do strengthening exercises at least twice a week. This is in addition to the moderate-intensity exercise.  Maintain a healthy weight  Body mass index (BMI) is a measurement that can be used to identify possible weight problems. It estimates body fat based on height and weight. Your health care provider can help determine your BMI and help you achieve or maintain a healthy weight.  For females 20 years of age and older:   A BMI below 18.5 is considered underweight.  A BMI of 18.5 to 24.9 is normal.  A BMI of 25 to 29.9 is considered overweight.  A BMI of 30 and above is considered obese.  Watch levels of cholesterol and blood lipids  You should start having your blood tested for lipids and cholesterol at 67 years of age, then have this test every 5 years.  You may need to have your cholesterol levels checked more often if:  Your lipid  or cholesterol levels are high.  You are older than 67 years of age.  You are at high risk for heart disease.  CANCER SCREENING   Lung Cancer  Lung cancer screening is recommended for adults 55-80 years old who are at high risk for lung cancer because of a history of smoking.  A yearly low-dose CT scan of the lungs is recommended for people who:  Currently smoke.  Have quit within the past 15 years.  Have at least a 30-pack-year history of smoking. A pack year is smoking an average of one pack of cigarettes a day for 1 year.  Yearly screening should continue until it has been 15 years since you quit.  Yearly screening should stop if you develop a health problem that would prevent you from having lung cancer treatment.  Breast Cancer  Practice breast self-awareness. This means understanding how your breasts normally appear and feel.  It also means doing regular breast self-exams. Let your health care provider know about any changes, no matter how small.  If you are in your 20s or 30s, you should have a clinical breast exam (CBE) by a health care provider every 1-3 years as part of a regular health exam.  If you are 40 or older, have a CBE every year. Also consider having a breast X-ray (mammogram) every year.  If you have a family history of breast cancer, talk to your health care provider about genetic screening.  If you   are at high risk for breast cancer, talk to your health care provider about having an MRI and a mammogram every year.  Breast cancer gene (BRCA) assessment is recommended for women who have family members with BRCA-related cancers. BRCA-related cancers include:  Breast.  Ovarian.  Tubal.  Peritoneal cancers.  Results of the assessment will determine the need for genetic counseling and BRCA1 and BRCA2 testing. Cervical Cancer Your health care provider may recommend that you be screened regularly for cancer of the pelvic organs (ovaries, uterus, and  vagina). This screening involves a pelvic examination, including checking for microscopic changes to the surface of your cervix (Pap test). You may be encouraged to have this screening done every 3 years, beginning at age 21.  For women ages 30-65, health care providers may recommend pelvic exams and Pap testing every 3 years, or they may recommend the Pap and pelvic exam, combined with testing for human papilloma virus (HPV), every 5 years. Some types of HPV increase your risk of cervical cancer. Testing for HPV may also be done on women of any age with unclear Pap test results.  Other health care providers may not recommend any screening for nonpregnant women who are considered low risk for pelvic cancer and who do not have symptoms. Ask your health care provider if a screening pelvic exam is right for you.  If you have had past treatment for cervical cancer or a condition that could lead to cancer, you need Pap tests and screening for cancer for at least 20 years after your treatment. If Pap tests have been discontinued, your risk factors (such as having a new sexual partner) need to be reassessed to determine if screening should resume. Some women have medical problems that increase the chance of getting cervical cancer. In these cases, your health care provider may recommend more frequent screening and Pap tests. Colorectal Cancer  This type of cancer can be detected and often prevented.  Routine colorectal cancer screening usually begins at 67 years of age and continues through 67 years of age.  Your health care provider may recommend screening at an earlier age if you have risk factors for colon cancer.  Your health care provider may also recommend using home test kits to check for hidden blood in the stool.  A small camera at the end of a tube can be used to examine your colon directly (sigmoidoscopy or colonoscopy). This is done to check for the earliest forms of colorectal  cancer.  Routine screening usually begins at age 50.  Direct examination of the colon should be repeated every 5-10 years through 67 years of age. However, you may need to be screened more often if early forms of precancerous polyps or small growths are found. Skin Cancer  Check your skin from head to toe regularly.  Tell your health care provider about any new moles or changes in moles, especially if there is a change in a mole's shape or color.  Also tell your health care provider if you have a mole that is larger than the size of a pencil eraser.  Always use sunscreen. Apply sunscreen liberally and repeatedly throughout the day.  Protect yourself by wearing long sleeves, pants, a wide-brimmed hat, and sunglasses whenever you are outside. HEART DISEASE, DIABETES, AND HIGH BLOOD PRESSURE   High blood pressure causes heart disease and increases the risk of stroke. High blood pressure is more likely to develop in:  People who have blood pressure in the high end   of the normal range (130-139/85-89 mm Hg).  People who are overweight or obese.  People who are African American.  If you are 16-3 years of age, have your blood pressure checked every 3-5 years. If you are 67 years of age or older, have your blood pressure checked every year. You should have your blood pressure measured twice--once when you are at a hospital or clinic, and once when you are not at a hospital or clinic. Record the average of the two measurements. To check your blood pressure when you are not at a hospital or clinic, you can use:  An automated blood pressure machine at a pharmacy.  A home blood pressure monitor.  If you are between 55 years and 68 years old, ask your health care provider if you should take aspirin to prevent strokes.  Have regular diabetes screenings. This involves taking a blood sample to check your fasting blood sugar level.  If you are at a normal weight and have a low risk for diabetes,  have this test once every three years after 67 years of age.  If you are overweight and have a high risk for diabetes, consider being tested at a younger age or more often. PREVENTING INFECTION  Hepatitis B  If you have a higher risk for hepatitis B, you should be screened for this virus. You are considered at high risk for hepatitis B if:  You were born in a country where hepatitis B is common. Ask your health care provider which countries are considered high risk.  Your parents were born in a high-risk country, and you have not been immunized against hepatitis B (hepatitis B vaccine).  You have HIV or AIDS.  You use needles to inject street drugs.  You live with someone who has hepatitis B.  You have had sex with someone who has hepatitis B.  You get hemodialysis treatment.  You take certain medicines for conditions, including cancer, organ transplantation, and autoimmune conditions. Hepatitis C  Blood testing is recommended for:  Everyone born from [birth year] through 1967.  Anyone with known risk factors for hepatitis C. Sexually transmitted infections (STIs)  You should be screened for sexually transmitted infections (STIs) including gonorrhea and chlamydia if:  You are sexually active and are younger than 67 years of age.  You are older than 67 years of age and your health care provider tells you that you are at risk for this type of infection.  Your sexual activity has changed since you were last screened and you are at an increased risk for chlamydia or gonorrhea. Ask your health care provider if you are at risk.  If you do not have HIV, but are at risk, it may be recommended that you take a prescription medicine daily to prevent HIV infection. This is called pre-exposure prophylaxis (PrEP). You are considered at risk if:  You are sexually active and do not regularly use condoms or know the HIV status of your partner(s).  You take drugs by injection.  You are sexually  active with a partner who has HIV. Talk with your health care provider about whether you are at high risk of being infected with HIV. If you choose to begin PrEP, you should first be tested for HIV. You should then be tested every 3 months for as long as you are taking PrEP.  PREGNANCY   If you are premenopausal and you may become pregnant, ask your health care provider about preconception counseling.  If you may  become pregnant, take 400 to 800 micrograms (mcg) of folic acid every day.  If you want to prevent pregnancy, talk to your health care provider about birth control (contraception). OSTEOPOROSIS AND MENOPAUSE   Osteoporosis is a disease in which the bones lose minerals and strength with aging. This can result in serious bone fractures. Your risk for osteoporosis can be identified using a bone density scan.  If you are 45 years of age or older, or if you are at risk for osteoporosis and fractures, ask your health care provider if you should be screened.  Ask your health care provider whether you should take a calcium or vitamin D supplement to lower your risk for osteoporosis.  Menopause may have certain physical symptoms and risks.  Hormone replacement therapy may reduce some of these symptoms and risks. Talk to your health care provider about whether hormone replacement therapy is right for you.  HOME CARE INSTRUCTIONS   Schedule regular health, dental, and eye exams.  Stay current with your immunizations.   Do not use any tobacco products including cigarettes, chewing tobacco, or electronic cigarettes.  If you are pregnant, do not drink alcohol.  If you are breastfeeding, limit how much and how often you drink alcohol.  Limit alcohol intake to no more than 1 drink per day for nonpregnant women. One drink equals 12 ounces of beer, 5 ounces of wine, or 1 ounces of hard liquor.  Do not use street drugs.  Do not share needles.  Ask your health care provider for help if  you need support or information about quitting drugs.  Tell your health care provider if you often feel depressed.  Tell your health care provider if you have ever been abused or do not feel safe at home.   This information is not intended to replace advice given to you by your health care provider. Make sure you discuss any questions you have with your health care provider.   Document Released: 02/01/2011 Document Revised: 08/09/2014 Document Reviewed: 06/20/2013 Elsevier Interactive Patient Education [employer].

## 2015-09-30 NOTE — Progress Notes (Signed)
Subjective:    Patient ID: Renee Davidson, female    DOB: [birth year], 65 y.o.   MRN: [id number]   Patient here today for follow up of chronic medical problems.  Outpatient Encounter Prescriptions as of 09/30/2015  Medication Sig  . alendronate (FOSAMAX) 70 MG tablet TAKE 1 TABLET EVERY 7 DAYS WITH A FULL GLASS OF WATER ON AN EMPTY STOMACH  . aspirin EC 81 MG tablet Take 81 mg by mouth daily.  . Calcium-Vitamin D-Vitamin K 500-100-40 MG-UNT-MCG CHEW Chew 1 tablet by mouth 2 (two) times daily.  Marland Kitchen escitalopram (LEXAPRO) 20 MG tablet Take 1 tablet (20 mg total) by mouth daily.  Marland Kitchen levothyroxine (SYNTHROID, LEVOTHROID) 75 MCG tablet TAKE (1) TABLET DAILY BE- FORE BREAKFAST.  Marland Kitchen LORazepam (ATIVAN) 0.5 MG tablet TAKE 1 TABLET THREE TIMES DAILY AS NEEDED FOR ANXIETY  . lovastatin (MEVACOR) 40 MG tablet TAKE 1 TABLET ONCE DAILY AT BEDTIME  . omeprazole (PRILOSEC) 20 MG capsule TAKE (1) CAPSULE DAILY  . risperiDONE (RISPERDAL) 0.25 MG tablet TAKE 1 TABLET IN THE MORNING AND 2 TABLETS AT BEDTIME   No facility-administered encounter medications on file as of 09/30/2015.    Hyperlipidemia This is a chronic problem. The current episode started more than 1 year ago. Recent lipid tests were reviewed and are variable. Pertinent negatives include no shortness of breath. Current antihyperlipidemic treatment includes statins. The current treatment provides moderate improvement of lipids. Compliance problems include adherence to diet and adherence to exercise.  Risk factors for coronary artery disease include hypertension.  Thyroid Problem Visit type: hypothyroidism. The symptoms have been stable. Her past medical history is significant for hyperlipidemia.  GERD  On prilosec daily which helps keep symptoms under control osteoporosis Fosamax - no side effects- does some walking daily- no recent c/o back pain or falls OCD/GAD On lexapro- helps her from worrying all the time about the samethings- no medication side  effects. Is also on ativan daily. insomnia risperdal helps her rest better at night     Review of Systems  Constitutional: Negative.   HENT: Negative.   Respiratory: Negative.  Negative for shortness of breath.   Cardiovascular: Negative.   Gastrointestinal: Negative.   Genitourinary: Negative.   Neurological: Negative.   Psychiatric/Behavioral: Negative.   All other systems reviewed and are negative.      Objective:   Physical Exam  Constitutional: She is oriented to person, place, and time. She appears well-developed and well-nourished.  HENT:  Nose: Nose normal.  Mouth/Throat: Oropharynx is clear and moist.  Eyes: EOM are normal.  Neck: Trachea normal, normal range of motion and full passive range of motion without pain. Neck supple. No JVD present. Carotid bruit is not present. No thyromegaly present.  Cardiovascular: Normal rate, regular rhythm, normal heart sounds and intact distal pulses.  Exam reveals no gallop and no friction rub.   No murmur heard. Pulmonary/Chest: Effort normal and breath sounds normal.  Abdominal: Soft. Bowel sounds are normal. She exhibits no distension and no mass. There is no tenderness.  Musculoskeletal: Normal range of motion.  Lymphadenopathy:    She has no cervical adenopathy.  Neurological: She is alert and oriented to person, place, and time. She has normal reflexes.  Skin: Skin is warm and dry.  Psychiatric: She has a normal mood and affect. Her behavior is normal. Judgment and thought content normal.   BP 109/67 mmHg  Pulse 66  Temp(Src) 96.8 F (36 C) (Oral)  Ht _0  (1.651 m)  Wt  149 lb (67.586 kg)  BMI 24.79 kg/m2  Chest x ray- no cardiopulmionary disease-Preliminary reading by Ronnald Collum, FNP  [facility]  EKG- NSR-Samhita-Margaret Hassell Done, FNP      Assessment & Plan:  1. Gastroesophageal reflux disease, esophagitis presence not specified Avoid spicy foods Do not eat 2 hours prior to bedtime - omeprazole (PRILOSEC) 20 MG  capsule; TAKE (1) CAPSULE DAILY  Dispense: 30 capsule; Refill: 5  2. Hypothyroidism, unspecified hypothyroidism type - levothyroxine (SYNTHROID, LEVOTHROID) 75 MCG tablet; TAKE (1) TABLET DAILY BE- FORE BREAKFAST.  Dispense: 30 tablet; Refill: 5  3. Osteoporosis, post-menopausal Weight bearing exercises - alendronate (FOSAMAX) 70 MG tablet; TAKE 1 TABLET EVERY 7 DAYS WITH A FULL GLASS OF WATER ON AN EMPTY STOMACH  Dispense: 12 tablet; Refill: 3  4. Hyperlipidemia with target LDL less than 100 Low fat diet - lovastatin (MEVACOR) 40 MG tablet; TAKE 1 TABLET ONCE DAILY AT BEDTIME  Dispense: 30 tablet; Refill: 5 - CMP14+EGFR - Lipid panel - DG Chest 2 View; Future - EKG 12-Lead  5. GAD (generalized anxiety disorder) Stress management - LORazepam (ATIVAN) 0.5 MG tablet; TAKE 1 TABLET THREE TIMES DAILY AS NEEDED FOR ANXIETY  Dispense: 90 tablet; Refill: 1  6. Insomnia Bedtime ritual - risperiDONE (RISPERDAL) 0.25 MG tablet; TAKE 1 TABLET IN THE MORNING AND 2 TABLETS AT BEDTIME  Dispense: 90 tablet; Refill: 5  7. OCD (obsessive compulsive disorder) - escitalopram (LEXAPRO) 20 MG tablet; Take 1 tablet (20 mg total) by mouth daily.  Dispense: 30 tablet; Refill: 5  8. Need for hepatitis C screening test - Hepatitis C antibody    Labs pending Health maintenance reviewed Diet and exercise encouraged Continue all meds Follow up  In 6 months   [place], FNP

## 2015-10-01 LAB — CMP14+EGFR
ALT: 19 IU/L (ref 0–32)
AST: 23 IU/L (ref 0–40)
Albumin/Globulin Ratio: 1.8 (ref 1.1–2.5)
Albumin: 4.3 g/dL (ref 3.6–4.8)
Alkaline Phosphatase: 77 IU/L (ref 39–117)
BUN/Creatinine Ratio: 14 (ref 11–26)
BUN: 12 mg/dL (ref 8–27)
Bilirubin Total: 0.3 mg/dL (ref 0.0–1.2)
CO2: 26 mmol/L (ref 18–29)
Calcium: 9.4 mg/dL (ref 8.7–10.3)
Chloride: 101 mmol/L (ref 96–106)
Creatinine, Ser: 0.83 mg/dL (ref 0.57–1.00)
GFR calc Af Amer: 85 mL/min/{1.73_m2} (ref >59)
GFR calc non Af Amer: 74 mL/min/{1.73_m2} (ref >59)
Globulin, Total: 2.4 g/dL (ref 1.5–4.5)
Glucose: 84 mg/dL (ref 65–99)
Potassium: 4 mmol/L (ref 3.5–5.2)
Sodium: 144 mmol/L (ref 134–144)
Total Protein: 6.7 g/dL (ref 6.0–8.5)

## 2015-10-01 LAB — LIPID PANEL
Chol/HDL Ratio: 3.2 ratio units (ref 0.0–4.4)
Cholesterol, Total: 168 mg/dL (ref 100–199)
HDL: 53 mg/dL (ref >39)
LDL Calculated: 101 mg/dL — ABNORMAL HIGH (ref 0–99)
Triglycerides: 70 mg/dL (ref 0–149)
VLDL Cholesterol Cal: 14 mg/dL (ref 5–40)

## 2015-10-01 LAB — HEPATITIS C ANTIBODY: Hep C Virus Ab: <0.1 s/co ratio (ref 0.0–0.9)

## 2015-11-23 ENCOUNTER — Emergency Department (HOSPITAL_COMMUNITY)
Admission: EM | Admit: 2015-11-23 | Discharge: 2015-11-23 | Disposition: A | Discharge status: Home / Self Care | Payer: Medicare Other | Attending: Emergency Medicine | Admitting: Emergency Medicine

## 2015-11-23 ENCOUNTER — Emergency Department (HOSPITAL_COMMUNITY): Payer: Medicare Other

## 2015-11-23 ENCOUNTER — Encounter (HOSPITAL_COMMUNITY): Payer: Self-pay

## 2015-11-23 DIAGNOSIS — F419 Anxiety disorder, unspecified: Secondary | ICD-10-CM | POA: Insufficient documentation

## 2015-11-23 DIAGNOSIS — Y998 Other external cause status: Secondary | ICD-10-CM | POA: Diagnosis not present

## 2015-11-23 DIAGNOSIS — R0989 Other specified symptoms and signs involving the circulatory and respiratory systems: Secondary | ICD-10-CM | POA: Diagnosis not present

## 2015-11-23 DIAGNOSIS — E785 Hyperlipidemia, unspecified: Secondary | ICD-10-CM | POA: Diagnosis not present

## 2015-11-23 DIAGNOSIS — K219 Gastro-esophageal reflux disease without esophagitis: Secondary | ICD-10-CM | POA: Diagnosis not present

## 2015-11-23 DIAGNOSIS — X58XXXA Exposure to other specified factors, initial encounter: Secondary | ICD-10-CM | POA: Diagnosis not present

## 2015-11-23 DIAGNOSIS — E079 Disorder of thyroid, unspecified: Secondary | ICD-10-CM | POA: Diagnosis not present

## 2015-11-23 DIAGNOSIS — Z79899 Other long term (current) drug therapy: Secondary | ICD-10-CM | POA: Diagnosis not present

## 2015-11-23 DIAGNOSIS — R1110 Vomiting, unspecified: Secondary | ICD-10-CM | POA: Diagnosis not present

## 2015-11-23 DIAGNOSIS — R079 Chest pain, unspecified: Secondary | ICD-10-CM | POA: Diagnosis not present

## 2015-11-23 DIAGNOSIS — Y9289 Other specified places as the place of occurrence of the external cause: Secondary | ICD-10-CM | POA: Insufficient documentation

## 2015-11-23 DIAGNOSIS — T17220A Food in pharynx causing asphyxiation, initial encounter: Secondary | ICD-10-CM | POA: Diagnosis not present

## 2015-11-23 DIAGNOSIS — F329 Major depressive disorder, single episode, unspecified: Secondary | ICD-10-CM | POA: Insufficient documentation

## 2015-11-23 DIAGNOSIS — F429 Obsessive-compulsive disorder, unspecified: Secondary | ICD-10-CM | POA: Insufficient documentation

## 2015-11-23 DIAGNOSIS — Z7982 Long term (current) use of aspirin: Secondary | ICD-10-CM | POA: Diagnosis not present

## 2015-11-23 DIAGNOSIS — Y9389 Activity, other specified: Secondary | ICD-10-CM | POA: Diagnosis not present

## 2015-11-23 DIAGNOSIS — M810 Age-related osteoporosis without current pathological fracture: Secondary | ICD-10-CM | POA: Insufficient documentation

## 2015-11-23 DIAGNOSIS — T17320A Food in larynx causing asphyxiation, initial encounter: Secondary | ICD-10-CM

## 2015-11-23 DIAGNOSIS — T17390A Other foreign object in larynx causing asphyxiation, initial encounter: Secondary | ICD-10-CM | POA: Diagnosis not present

## 2015-11-23 NOTE — ED Notes (Signed)
At this time she drinks water without difficulty and without choking.

## 2015-11-23 NOTE — ED Notes (Signed)
Jarrett Soho, PA-C informed pt able to drink without difficulty.  Jarrett Soho requested pt be given crackers to see if tolerated.

## 2015-11-23 NOTE — ED Notes (Signed)
Pt given 2 saltines/  Encouraged to take small bites & drink with sips of water.  Pt currently denies feeling of swallowing difficulty.

## 2015-11-23 NOTE — ED Notes (Signed)
She states she experienced a choking sensation at which time she was unable to speak and her (elderly) father performed a Heimlich maneuver which was successful, in that she spit up some beef and "a lot of liquid".  As I write this her air exchange is normal and without effort or stridor, nor any other difficulty.  Her skin is normal, warm and dry.  Paramedics at scene offered her a drink of water, about which the pt. States "It wouldn't go down and I threw it all up".  She has not attempted any oral intake since.

## 2015-11-23 NOTE — ED Notes (Signed)
She remains in no distress; continues to breath normally and has ambulated to b.r. And back without difficulty.

## 2015-11-23 NOTE — Discharge Instructions (Signed)
1. Medications: usual home medications 2. Treatment: rest, drink plenty of fluids, take smaller bites, chew well 3. Follow Up: Please followup with your primary doctor in 2 days for discussion of your diagnoses and further evaluation after today's visit; if you do not have a primary care doctor use the resource guide provided to find one; Please return to the ER for return of symptoms   Choking, Adult Choking occurs when a food or object gets stuck in the throat or trachea, blocking the airway. If the airway is partly blocked, coughing will usually cause the food or object to come out. If the airway is completely blocked, immediate action is needed to help it come out. A complete airway blockage is life threatening because it causes breathing to stop. Choking is a true medical emergency that requires fast, appropriate action by anyone available. SIGNS OF AIRWAY BLOCKAGE There is a partial airway blockage if you or the person who is choking is:   Able to breathe and speak.  Coughing loudly.  Making loud noises. There is a complete airway blockage if you or the person who is choking is:   Unable to breathe.  Making soft or high-pitched sounds while breathing.  Unable to cough or coughing weakly, ineffectively, or silently.  Unable to cry, speak, or make sounds.  Turning blue.  Holding the neck with both arms. This is the universal sign of choking. WHAT TO DO IF CHOKING OCCURS If there is a partial airway blockage, allow coughing to clear the airway. Do not try to drink until the food or object comes out. If someone else has a partial airway blockage, do not interfere. Stay with him or her and watch for signs of complete airway blockage until the food or object comes out.  If there is a complete airway blockage or if there is a partial airway blockage and the food or object does not come out, perform abdominal thrusts (also referred to as the Heimlich maneuver). Abdominal thrusts are used  to create an artificial cough to try to clear the airway. Performing abdominal thrusts is part of a series of steps that should be done to help someone who is choking. Abdominal thrusts are usually done by someone else, but if you are alone, you can perform abdominal thrusts on yourself. Follow the procedure below that best fits your situation.  IF SOMEONE ELSE IS CHOKING: For a conscious adult:  1. Ask the person whether he or she is choking. If the person nods, continue to step 2. 2. Stand or kneel behind the person and lean him or her forward slightly. 3. Make a fist with 1 hand, put your arms around the person, and grasp your fist with your other hand. Place the thumb side of your fist in the person's abdomen, just below the ribs. 4. Press inward and upward with both hands. 5. Repeat this maneuver until the object comes out and the person is able to breathe or until the person loses consciousness. For an unconscious adult: 1. Shout for help. If someone responds, have him or her call local emergency services (911 in [place] If no one responds, call local emergency services yourself if possible. 2. Begin CPR, starting with compressions. Every time you open the airway to give rescue breaths, open the person's mouth. If you can see the food or object and it can be easily pulled out, remove it with your fingers. 3. After 5 cycles or 2 minutes of CPR, call local emergency services (911  in [place] if you or someone else did not already call. For a conscious adult who is obese or in the later stages of pregnancy: Abdominal thrusts may not be effective when helping people who are in the later stages of pregnancy or who are obese. In these instances, chest thrusts can be used.  1. Ask the person whether he or she is choking. If the person nods and has signs of complete airway blockage, continue to step 2. 2. Stand behind the person and wrap your arms around his or her chest (with your arms under the person's  armpits). 3. Make a fist with 1 hand. Place the thumb side of your fist on the middle of the person's breastbone. 4. Grab your fist with your other hand and thrust backward. Continue this until the object comes out or until the person becomes unconscious. For an unconscious adult who is obese or in the later stages of pregnancy:  1. Shout for help. If someone responds, have him or her call local emergency services (911 in [place] If no one responds, call local emergency services yourself if possible. 2. Begin CPR, starting with compressions. Every time you open the airway to give rescue breaths, open the person's mouth. If you can see the food or object and it can be easily pulled out, remove it with your fingers. 3. After 5 cycles or 2 minutes of CPR, call local emergency services (911 in [place] if you or someone else did not already call. Note that abdominal thrusts (below the rib cage) should be used for a pregnant woman if possible. This should be possible until the later stages of pregnancy when there is no longer enough room between the enlarging uterus and the rib cage to perform the maneuver. At that point, chest thrusts must be used as described. IF YOU ARE CHOKING: 1. Call local emergency services (911 in [place] if near a landline. Do not worry about communicating what is happening. Do not hang up the phone. Someone may be sent to help you anyway. 2. Make a fist with 1 hand. Put the thumb side of the fist against your stomach, just above the belly button and well below the breastbone. If you are pregnant or obese, put your fist on your chest instead, just below the breastbone and just above your lowest ribs. 3. Hold your fist with your other hand and bend over a hard surface, such as a table or chair. 4. Forcefully push your fist in and up. 5. Continue to do this until the food or object comes out. PREVENTION  To be prepared if choking occurs, learn how to correctly perform abdominal thrusts  and give CPR by taking a certified first-aid training course.  SEEK IMMEDIATE MEDICAL CARE IF:  You have a fever after choking stops.  You have problems breathing after choking stops.  You received the Heimlich maneuver. MAKE SURE YOU:  Understand these instructions.  Watch your condition.  Get help right away if you are not doing well or get worse.   This information is not intended to replace advice given to you by your health care provider. Make sure you discuss any questions you have with your health care provider.   Document Released: 08/26/2004 Document Revised: 08/09/2014 Document Reviewed: 02/29/2012 Elsevier Interactive Patient Education [employer].

## 2015-11-23 NOTE — ED Provider Notes (Signed)
CSN: [id number]     Arrival date & time 11/23/15  1447 History   None    Chief Complaint  Patient presents with  . Choking     (Consider location/radiation/quality/duration/timing/severity/associated sxs/prior Treatment) The history is provided by the patient, the EMS personnel, medical records, a parent and a relative. No language interpreter was used.     Renee Davidson is a 65 y.o. female  with a hx of depression, anxiety, thyroid disease, OCD, GERD presents to the Emergency Department complaining of acute episode of choking.  Pt attempted to perform the heimlich maneuver with a chair then her father (who is 60yo) performed the maneuver on her and was able to dislodge the piece of meat that was lodged.  She reports vomiting a large amount of fluid after the choking resolved.  She reports that her throat feels" numb" but otherwise is without pain or complaint. She reports no difficulty swallowing. Per EMS they attempted a by mouth trial with water which caused immediate vomiting on scene.  Patient's sister at bedside reports 1 previous episode similar to this where patient choked on an apple.  Patient's sister reports there was no need for GI intervention.  Patient denies fever, chills, headache, neck pain, chest pain, shortness of breath, abdominal pain, syncope.  Past Medical History  Diagnosis Date  . Depression   . Anxiety   . Thyroid disease   . Hyperlipidemia   . Osteopenia   . OCD (obsessive compulsive disorder)   . Osteoporosis   . GERD (gastroesophageal reflux disease)    Past Surgical History  Procedure Laterality Date  . Tonsillectomy     Family History  Problem Relation Age of Onset  . Diabetes Father   . Hyperlipidemia Father   . Hypertension Father   . Atrial fibrillation Father    Social History  Substance Use Topics  . Smoking status: Never Smoker   . Smokeless tobacco: Never Used  . Alcohol Use: No   OB History    No data available     Review of Systems   Constitutional: Negative for fever, diaphoresis, appetite change, fatigue and unexpected weight change.  HENT: Negative for mouth sores.   Eyes: Negative for visual disturbance.  Respiratory: Positive for choking ( Resolved). Negative for cough, chest tightness, shortness of breath and wheezing.   Cardiovascular: Negative for chest pain.  Gastrointestinal: Positive for vomiting ( After Heimlich maneuver). Negative for nausea, abdominal pain, diarrhea and constipation.  Endocrine: Negative for polydipsia, polyphagia and polyuria.  Genitourinary: Negative for dysuria, urgency, frequency and hematuria.  Musculoskeletal: Negative for back pain and neck stiffness.  Skin: Negative for rash.  Allergic/Immunologic: Negative for immunocompromised state.  Neurological: Negative for syncope, light-headedness and headaches.  Hematological: Does not bruise/bleed easily.  Psychiatric/Behavioral: Negative for sleep disturbance. The patient is not nervous/anxious.       Allergies  Review of patient's allergies indicates no known allergies.  Home Medications   Prior to Admission medications   Medication Sig Start Date End Date Taking? Authorizing Provider  alendronate (FOSAMAX) 70 MG tablet TAKE 1 TABLET EVERY 7 DAYS WITH A FULL GLASS OF WATER ON AN EMPTY STOMACH 09/30/15  Yes Ronae-Margaret Hassell Done, FNP  aspirin EC 81 MG tablet Take 81 mg by mouth daily.   Yes Historical Provider, MD  Calcium-Vitamin D-Vitamin K 500-100-40 MG-UNT-MCG CHEW Chew 1 tablet by mouth daily.    Yes Historical Provider, MD  escitalopram (LEXAPRO) 20 MG tablet Take 1 tablet (20 mg total) by  mouth daily. 09/30/15  Yes Bethene-Margaret Hassell Done, FNP  guaiFENesin (MUCINEX) 600 MG 12 hr tablet Take 600 mg by mouth 2 (two) times daily as needed for cough.   Yes Historical Provider, MD  levothyroxine (SYNTHROID, LEVOTHROID) 75 MCG tablet TAKE (1) TABLET DAILY BE- FORE BREAKFAST. 09/30/15  Yes Hortence-Margaret Hassell Done, FNP  LORazepam (ATIVAN)  0.5 MG tablet TAKE 1 TABLET THREE TIMES DAILY AS NEEDED FOR ANXIETY 09/30/15  Yes Emmarae-Margaret Hassell Done, FNP  lovastatin (MEVACOR) 40 MG tablet TAKE 1 TABLET ONCE DAILY AT BEDTIME 09/30/15  Yes Rorey-Margaret Hassell Done, FNP  omeprazole (PRILOSEC) 20 MG capsule TAKE (1) CAPSULE DAILY 09/30/15  Yes Teonia-Margaret Hassell Done, FNP  risperiDONE (RISPERDAL) 0.25 MG tablet TAKE 1 TABLET IN THE MORNING AND 2 TABLETS AT BEDTIME 09/30/15  Yes Merryn-Margaret Hassell Done, FNP   BP 129/83 mmHg  Pulse 71  Temp(Src) 98.2 F (36.8 C) (Oral)  Resp 20  SpO2 94% Physical Exam  Constitutional: She is oriented to person, place, and time. She appears well-developed and well-nourished. No distress.  Awake, alert, nontoxic appearance  HENT:  Head: Normocephalic and atraumatic.  Right Ear: Tympanic membrane, external ear and ear canal normal.  Left Ear: Tympanic membrane, external ear and ear canal normal.  Nose: Nose normal. No epistaxis. Right sinus exhibits no maxillary sinus tenderness and no frontal sinus tenderness. Left sinus exhibits no maxillary sinus tenderness and no frontal sinus tenderness.  Mouth/Throat: Uvula is midline, oropharynx is clear and moist and mucous membranes are normal. Mucous membranes are not pale and not cyanotic. No oropharyngeal exudate, posterior oropharyngeal edema, posterior oropharyngeal erythema or tonsillar abscesses.  Eyes: Conjunctivae are normal. Pupils are equal, round, and reactive to light. No scleral icterus.  Neck: Normal range of motion and full passive range of motion without pain. Neck supple.  Handling secretions Normal phonation No stridor Full range of motion without pain  Cardiovascular: Normal rate, regular rhythm, normal heart sounds and intact distal pulses.   Pulmonary/Chest: Effort normal and breath sounds normal. No stridor. No respiratory distress. She has no wheezes. She exhibits tenderness.  Equal chest expansion Clear and equal breath sounds Round area of petechiae to  the central chest  Abdominal: Soft. Bowel sounds are normal. She exhibits no mass. There is no tenderness. There is no rebound and no guarding.  Abdomen soft and nontender  Musculoskeletal: Normal range of motion. She exhibits no edema.  Lymphadenopathy:    She has no cervical adenopathy.  Neurological: She is alert and oriented to person, place, and time.  Speech is clear and goal oriented Moves extremities without ataxia  Skin: Skin is warm and dry. No rash noted. She is not diaphoretic.  Psychiatric: She has a normal mood and affect.  Nursing note and vitals reviewed.   ED Course  Procedures (including critical care time)  Imaging Review Dg Neck Soft Tissue  11/23/2015  CLINICAL DATA:  Choking episode today requiring Heimlich maneuver. EXAM: NECK SOFT TISSUES - 1+ VIEW COMPARISON:  None. FINDINGS: The prevertebral soft tissues are normal. There is no thickening of the epiglottis or airway compromise. No foreign bodies are identified. There are degenerative changes throughout the cervical spine with disc space loss and uncinate spurring greatest at C3-4 and C4-5. Asymmetric right carotid calcifications are noted. IMPRESSION: No evidence of airway foreign body or compromise. Electronically Signed   By: Richardean Sale M.D.   On: 11/23/2015 16:16   Dg Abd Acute W/chest  11/23/2015  CLINICAL DATA:  Choking. EXAM: DG ABDOMEN ACUTE W/ 1V  CHEST COMPARISON:  09/30/2015 chest radiograph. FINDINGS: Stable cardiomediastinal silhouette with normal heart size. No pneumothorax. No pleural effusion. Lungs appear clear, with no acute consolidative airspace disease and no pulmonary edema. No dilated small bowel loops. Moderate stool throughout the colon and rectum. No evidence of pneumatosis, pneumoperitoneum or pathologic soft tissue calcification. IMPRESSION: 1. No active disease in the chest. 2. Nonobstructive bowel gas pattern. 3. Moderate colorectal stool volume, suggesting constipation. Electronically  Signed   By: Ilona Sorrel M.D.   On: 11/23/2015 16:18   I have personally reviewed and evaluated these images and lab results as part of my medical decision-making.    MDM   Final diagnoses:  Choking due to food (regurgitated), initial encounter   TAMICO BLAKER presents after choking episode.  Pt is NAD at this time.  Alert, sitting in bed, handling secretions without difficulty.    4:24 PM Plain films without Pneumothorax, pneumoperitoneum or bowel obstruction.  Plain films of the neck without evidence of foreign body or airway compromise. No thickening of the epiglottis.  Will by mouth trial with fluids and reassess.  5:13 PM Patient tolerating by mouth fluids and solids. No choking or vomiting. She remains in no distress. Infiltrate that assistance. Normal phonation, no stridor and handling secretions. No concern for impacted food bolus at this time. We'll discharge to home with family. Discussed small bites, chewing well and drinking plenty of fluids while eating.  Patient and family state understanding.    Jarrett Soho Leshae Mcclay, PA-C 11/23/15 1724  Harvel Quale, MD 11/26/15 [phone]

## 2015-11-25 ENCOUNTER — Other Ambulatory Visit: Payer: Self-pay | Admitting: Nurse Practitioner

## 2015-11-25 NOTE — Telephone Encounter (Signed)
Please call in ativan with 1 refills 

## 2015-11-25 NOTE — Telephone Encounter (Signed)
Lat filled 10/28/2015. If approved please route to pool and have nurse call into pharmacy

## 2015-11-25 NOTE — Telephone Encounter (Signed)
Medication called in, patient informed

## 2016-01-16 ENCOUNTER — Encounter (HOSPITAL_COMMUNITY): Payer: Self-pay

## 2016-01-16 ENCOUNTER — Emergency Department (HOSPITAL_COMMUNITY): Payer: Medicare Other

## 2016-01-16 ENCOUNTER — Emergency Department (HOSPITAL_COMMUNITY)
Admission: EM | Admit: 2016-01-16 | Discharge: 2016-01-16 | Disposition: A | Discharge status: Home / Self Care | Payer: Medicare Other | Attending: Emergency Medicine | Admitting: Emergency Medicine

## 2016-01-16 DIAGNOSIS — Z7982 Long term (current) use of aspirin: Secondary | ICD-10-CM | POA: Diagnosis not present

## 2016-01-16 DIAGNOSIS — R0989 Other specified symptoms and signs involving the circulatory and respiratory systems: Secondary | ICD-10-CM

## 2016-01-16 DIAGNOSIS — E785 Hyperlipidemia, unspecified: Secondary | ICD-10-CM | POA: Diagnosis not present

## 2016-01-16 DIAGNOSIS — Z79899 Other long term (current) drug therapy: Secondary | ICD-10-CM | POA: Insufficient documentation

## 2016-01-16 DIAGNOSIS — R0602 Shortness of breath: Secondary | ICD-10-CM | POA: Diagnosis not present

## 2016-01-16 DIAGNOSIS — F329 Major depressive disorder, single episode, unspecified: Secondary | ICD-10-CM | POA: Insufficient documentation

## 2016-01-16 DIAGNOSIS — T17320A Food in larynx causing asphyxiation, initial encounter: Secondary | ICD-10-CM | POA: Diagnosis not present

## 2016-01-16 LAB — COMPREHENSIVE METABOLIC PANEL
ALT: 13 U/L — ABNORMAL LOW (ref 14–54)
AST: 23 U/L (ref 15–41)
Albumin: 4.2 g/dL (ref 3.5–5.0)
Alkaline Phosphatase: 70 U/L (ref 38–126)
Anion gap: 6 (ref 5–15)
BUN: 9 mg/dL (ref 6–20)
CO2: 25 mmol/L (ref 22–32)
Calcium: 9.3 mg/dL (ref 8.9–10.3)
Chloride: 105 mmol/L (ref 101–111)
Creatinine, Ser: 0.78 mg/dL (ref 0.44–1.00)
GFR calc Af Amer: >60 mL/min (ref >60)
GFR calc non Af Amer: >60 mL/min (ref >60)
Glucose, Bld: 138 mg/dL — ABNORMAL HIGH (ref 65–99)
Potassium: 3.6 mmol/L (ref 3.5–5.1)
Sodium: 136 mmol/L (ref 135–145)
Total Bilirubin: 0.5 mg/dL (ref 0.3–1.2)
Total Protein: 6.8 g/dL (ref 6.5–8.1)

## 2016-01-16 LAB — LIPASE, BLOOD: Lipase: 30 U/L (ref 11–51)

## 2016-01-16 LAB — CBC WITH DIFFERENTIAL/PLATELET
Basophils Absolute: 0 10*3/uL (ref 0.0–0.1)
Basophils Relative: 0 %
Eosinophils Absolute: 0 10*3/uL (ref 0.0–0.7)
Eosinophils Relative: 1 %
HCT: 40.4 % (ref 36.0–46.0)
Hemoglobin: 13.6 g/dL (ref 12.0–15.0)
Lymphocytes Relative: 25 %
Lymphs Abs: 1 10*3/uL (ref 0.7–4.0)
MCH: 30 pg (ref 26.0–34.0)
MCHC: 33.7 g/dL (ref 30.0–36.0)
MCV: 89 fL (ref 78.0–100.0)
Monocytes Absolute: 0.2 10*3/uL (ref 0.1–1.0)
Monocytes Relative: 5 %
Neutro Abs: 2.7 10*3/uL (ref 1.7–7.7)
Neutrophils Relative %: 69 %
Platelets: 185 10*3/uL (ref 150–400)
RBC: 4.54 MIL/uL (ref 3.87–5.11)
RDW: 13 % (ref 11.5–15.5)
WBC: 4 10*3/uL (ref 4.0–10.5)

## 2016-01-16 NOTE — Discharge Instructions (Signed)
Chew your food thoroughly. Follow up with your doctor and GI doctor. Return to the ED if you develop chest pain, shortness of breath, further difficulty swallowing or any other concerns.

## 2016-01-16 NOTE — ED Notes (Signed)
Pt requesting to leave, family out to desk requesting crackers for pt's father, nabs given, iv removed per Dr Wyvonnia Dusky, update given,

## 2016-01-16 NOTE — ED Notes (Addendum)
Pt was eating noodles at 1600 when she became choked, pt reports she could not breath but was able to cough and clear airway. Episode of vomiting after incident noted by EMS. Pt talking in full sentences and lungs are clear to auscultation. Pt has similar of same. Pt states her 67 year old father is her primary caregiver and he was able to successfully perform heimlich maneuver for the 2nd time. PT continues to report sensation of "fullness" in upper mid chest/esophageal area. No petechiae noted.

## 2016-01-16 NOTE — ED Notes (Addendum)
Pt tolerating po fluids well, pt and family updated on plan of care, pt requesting iv to be removed and wants to know when they are leaving, update given

## 2016-01-16 NOTE — ED Provider Notes (Signed)
CSN: [id number]     Arrival date & time 01/16/16  1816 History   First MD Initiated Contact with Patient 01/16/16 1836     Chief Complaint  Patient presents with  . Choking     (Consider location/radiation/quality/duration/timing/severity/associated sxs/prior Treatment) HPI Comments: Patient presents by EMS after choking episode at home eating noodles. States her 67 year old father performed Heimlich maneuver on her. She did not lose consciousness or pass out. She was able to spit up the noodles over her throat. She had difficulty breathing and could not swallow. Now she feels well and is tolerating her secretions and is breathing normally. Denies chest pain or shortness of breath. Similar episode 2 in April. Patient has not been seen by GI. Daughter reports a "family history of swallowing issues". Patient has never had an endoscopy. She has no difficulty speaking or swallowing at this time is controlling her secretions. She did not have color change or lose consciousness.  The history is provided by the patient, a relative and the EMS personnel.    Past Medical History  Diagnosis Date  . Depression   . Anxiety   . Thyroid disease   . Hyperlipidemia   . Osteopenia   . OCD (obsessive compulsive disorder)   . Osteoporosis   . GERD (gastroesophageal reflux disease)    Past Surgical History  Procedure Laterality Date  . Tonsillectomy     Family History  Problem Relation Age of Onset  . Diabetes Father   . Hyperlipidemia Father   . Hypertension Father   . Atrial fibrillation Father    Social History  Substance Use Topics  . Smoking status: Never Smoker   . Smokeless tobacco: Never Used  . Alcohol Use: No   OB History    No data available     Review of Systems  Constitutional: Negative for fever, activity change and appetite change.  HENT: Positive for sore throat and trouble swallowing.   Eyes: Negative for visual disturbance.  Respiratory: Positive for choking and  shortness of breath. Negative for cough and chest tightness.   Gastrointestinal: Negative for nausea, vomiting and abdominal pain.  Genitourinary: Negative for dysuria, hematuria, vaginal bleeding and vaginal discharge.  Musculoskeletal: Negative for myalgias and arthralgias.  Neurological: Negative for dizziness, weakness and headaches.  A complete 10 system review of systems was obtained and all systems are negative except as noted in the HPI and PMH.      Allergies  Review of patient's allergies indicates no known allergies.  Home Medications   Prior to Admission medications   Medication Sig Start Date End Date Taking? Authorizing Provider  alendronate (FOSAMAX) 70 MG tablet TAKE 1 TABLET EVERY 7 DAYS WITH A FULL GLASS OF WATER ON AN EMPTY STOMACH 09/30/15   Jyrah-Margaret Hassell Done, FNP  aspirin EC 81 MG tablet Take 81 mg by mouth daily.    Historical Provider, MD  Calcium-Vitamin D-Vitamin K 500-100-40 MG-UNT-MCG CHEW Chew 1 tablet by mouth daily.     Historical Provider, MD  escitalopram (LEXAPRO) 20 MG tablet Take 1 tablet (20 mg total) by mouth daily. 09/30/15   Laruth-Margaret Hassell Done, FNP  guaiFENesin (MUCINEX) 600 MG 12 hr tablet Take 600 mg by mouth 2 (two) times daily as needed for cough.    Historical Provider, MD  levothyroxine (SYNTHROID, LEVOTHROID) 75 MCG tablet TAKE (1) TABLET DAILY BE- FORE BREAKFAST. 09/30/15   Josy-Margaret Hassell Done, FNP  LORazepam (ATIVAN) 0.5 MG tablet TAKE 1 TABLET THREE TIMES DAILY AS NEEDED FOR ANXIETY 11/25/15  Henny-Margaret Hassell Done, FNP  lovastatin (MEVACOR) 40 MG tablet TAKE 1 TABLET ONCE DAILY AT BEDTIME 09/30/15   Zelma-Margaret Hassell Done, FNP  omeprazole (PRILOSEC) 20 MG capsule TAKE (1) CAPSULE DAILY 09/30/15   Kairi-Margaret Hassell Done, FNP  risperiDONE (RISPERDAL) 0.25 MG tablet TAKE 1 TABLET IN THE MORNING AND 2 TABLETS AT BEDTIME 09/30/15   Hudsyn-Margaret Hassell Done, FNP   BP 136/94 mmHg  Pulse 78  Resp 16  Ht 5\' 7"  (1.702 m)  Wt 140 lb (63.504 kg)  BMI 21.92  kg/m2  SpO2 98% Physical Exam  Constitutional: She is oriented to person, place, and time. She appears well-developed and well-nourished. No distress.  HENT:  Head: Normocephalic and atraumatic.  Mouth/Throat: Oropharynx is clear and moist. No oropharyngeal exudate.  Tolerate secretions, speaking in full sentences. No stridor. No drooling. No foreign body seen in oropharynx.  Eyes: Conjunctivae and EOM are normal. Pupils are equal, round, and reactive to light.  Neck: Normal range of motion. Neck supple.  No meningismus.  Cardiovascular: Normal rate, regular rhythm, normal heart sounds and intact distal pulses.   No murmur heard. Pulmonary/Chest: Effort normal and breath sounds normal. No respiratory distress. She has no wheezes. She exhibits no tenderness.  Abdominal: Soft. There is no tenderness. There is no rebound and no guarding.  Musculoskeletal: Normal range of motion. She exhibits no edema or tenderness.  Neurological: She is alert and oriented to person, place, and time. No cranial nerve deficit. She exhibits normal muscle tone. Coordination normal.  No ataxia on finger to nose bilaterally. No pronator drift. 5/5 strength throughout. CN 2-12 intact.Equal grip strength. Sensation intact.   Skin: Skin is warm. No rash noted.  No petechiae  Psychiatric: She has a normal mood and affect. Her behavior is normal.  Nursing note and vitals reviewed.   ED Course  Procedures (including critical care time) Labs Review Labs Reviewed  COMPREHENSIVE METABOLIC PANEL - Abnormal; Notable for the following:    Glucose, Bld 138 (*)    ALT 13 (*)    All other components within normal limits  CBC WITH DIFFERENTIAL/PLATELET  LIPASE, BLOOD    Imaging Review Dg Neck Soft Tissue  01/16/2016  CLINICAL DATA:  Choking sensation.  Post Heimlich maneuver. EXAM: NECK SOFT TISSUES - 1+ VIEW COMPARISON:  11/23/2015 FINDINGS: Alignment of cervical spine is normal. Normal appearance of the epiglottis and  aryepiglottic folds. Prevertebral soft tissues are normal. There is disc space narrowing at C3-C4 and C4-C5 and C5-C6. Facet arthropathy in the cervical spine. Upper lungs are clear. No evidence for a radiopaque foreign body. IMPRESSION: No acute abnormality. Degenerative changes in the cervical spine. Electronically Signed   By: Markus Daft M.D.   On: 01/16/2016 19:40   Dg Abd Acute W/chest  01/16/2016  CLINICAL DATA:  Patient with choking sensation. EXAM: DG ABDOMEN ACUTE W/ 1V CHEST COMPARISON:  Acute abdominal series 4 10/20/2015. FINDINGS: Normal cardiac and mediastinal contours. No consolidative pulmonary opacities. No pleural effusion or pneumothorax. Large amount of stool within the cecum, ascending and transverse colon. No gaseous distended loops of small bowel to suggest obstruction. Regional skeleton is unremarkable. IMPRESSION: Large amount of stool throughout the colon compatible with constipation. No acute cardiopulmonary process. Electronically Signed   By: Lovey Newcomer M.D.   On: 01/16/2016 19:41   I have personally reviewed and evaluated these images and lab results as part of my medical decision-making.   EKG Interpretation   Date/Time:  Friday January 16 2016 18:30:15 EDT Ventricular Rate:  85 PR Interval:  154 QRS Duration: 94 QT Interval:  398 QTC Calculation: 473 R Axis:   52 Text Interpretation:  Sinus rhythm Low voltage, precordial leads No  previous ECGs available Confirmed by Wyvonnia Dusky  MD, Jaydeen Darley [phone]) on  01/16/2016 6:49:39 PM      MDM   Final diagnoses:  Choking episode   Choking episode at home family performed Heimlich maneuver. Patient feels well at this time. No chest pain or shortness of breath.  X-rays negative for rib fracture, pneumothorax or foreign body.   does show large amount of stool.  We'll attempt by mouth challenge   9:50 PM. Patient anxious to leave. She is tolerating by mouth. She is tolerated both liquids and crackers in the ED. She  denies any difficulty breathing or swallowing. No stridor, no drooling.  Advised the patient needs that she chew food thoroughly, eat slowly, drink fluids while eating. Needs follow-up with gastroenterology to assess her swallowing function formally. Will start PPI. Return precautions discussed.  Ezequiel Essex, MD 01/17/16 [phone]

## 2016-01-19 ENCOUNTER — Telehealth: Payer: Self-pay | Admitting: Nurse Practitioner

## 2016-01-19 NOTE — Telephone Encounter (Signed)
Pt's sister, Amy, called to say patient had been to the ER and was recommended to call us for a follow up. Please advise if we are able to take her as a new patient. Sister requested that we call her with appt date and time. [id number]

## 2016-01-20 ENCOUNTER — Other Ambulatory Visit: Payer: Self-pay | Admitting: Nurse Practitioner

## 2016-01-20 NOTE — Telephone Encounter (Signed)
Last seen 09/30/15  MMM If approved route to nurse to call into [facility]

## 2016-01-20 NOTE — Telephone Encounter (Signed)
Refilled called to [facility]

## 2016-01-21 ENCOUNTER — Other Ambulatory Visit: Payer: Self-pay | Admitting: *Deleted

## 2016-01-21 ENCOUNTER — Telehealth: Payer: Self-pay | Admitting: Nurse Practitioner

## 2016-01-21 DIAGNOSIS — K219 Gastro-esophageal reflux disease without esophagitis: Secondary | ICD-10-CM

## 2016-01-21 DIAGNOSIS — R1310 Dysphagia, unspecified: Secondary | ICD-10-CM

## 2016-01-21 NOTE — Telephone Encounter (Signed)
No previous GI, ok to schedule for "dysphagia"

## 2016-01-21 NOTE — Telephone Encounter (Signed)
I called patient's sister, Amy, to offer OV with EG on Monday. She said that they decided to wait and get referral from her PCP first and will schedule later if needed

## 2016-01-22 ENCOUNTER — Encounter: Payer: Self-pay | Admitting: Gastroenterology

## 2016-01-26 ENCOUNTER — Ambulatory Visit: Payer: Medicare Other | Admitting: Nurse Practitioner

## 2016-02-16 ENCOUNTER — Other Ambulatory Visit: Payer: Self-pay | Admitting: Nurse Practitioner

## 2016-02-17 NOTE — Telephone Encounter (Signed)
Last seen February 2017

## 2016-02-17 NOTE — Telephone Encounter (Signed)
Please call in ativan with 1 refills 

## 2016-03-12 ENCOUNTER — Other Ambulatory Visit: Payer: Self-pay | Admitting: Nurse Practitioner

## 2016-03-15 NOTE — Telephone Encounter (Signed)
Last filled 01/31/2016. Last seen 09/30/2015. If approve please route to pool B for nurse to call into pharmacy.

## 2016-03-15 NOTE — Telephone Encounter (Signed)
Last refill without being seen 

## 2016-03-15 NOTE — Telephone Encounter (Signed)
Please call in lorazepam with 1 refills 

## 2016-03-15 NOTE — Telephone Encounter (Signed)
Refill called to [facility] 

## 2016-03-25 ENCOUNTER — Ambulatory Visit (INDEPENDENT_AMBULATORY_CARE_PROVIDER_SITE_OTHER): Payer: Medicare Other | Admitting: Gastroenterology

## 2016-03-25 ENCOUNTER — Encounter: Payer: Self-pay | Admitting: Gastroenterology

## 2016-03-25 VITALS — BP 94/68 | HR 72 | Ht 64.5 in | Wt 148.4 lb

## 2016-03-25 DIAGNOSIS — R1310 Dysphagia, unspecified: Secondary | ICD-10-CM | POA: Diagnosis not present

## 2016-03-25 NOTE — Patient Instructions (Signed)
You have been scheduled for a Barium Esophogram at [facility] (1st floor of the hospital) on 04-02-2016 at 930am. Please arrive 15 minutes prior to your appointment for registration. Make certain not to have anything to eat or drink 6 hours prior to your test. If you need to reschedule for any reason, please contact radiology at [phone] to do so. __________________________________________________________________ A barium swallow is an examination that concentrates on views of the esophagus. This tends to be a double contrast exam (barium and two liquids which, when combined, create a gas to distend the wall of the oesophagus) or single contrast (non-ionic iodine based). The study is usually tailored to your symptoms so a good history is essential. Attention is paid during the study to the form, structure and configuration of the esophagus, looking for functional disorders (such as aspiration, dysphagia, achalasia, motility and reflux) EXAMINATION You may be asked to change into a gown, depending on the type of swallow being performed. A radiologist and radiographer will perform the procedure. The radiologist will advise you of the type of contrast selected for your procedure and direct you during the exam. You will be asked to stand, sit or lie in several different positions and to hold a small amount of fluid in your mouth before being asked to swallow while the imaging is performed .In some instances you may be asked to swallow barium coated marshmallows to assess the motility of a solid food bolus. The exam can be recorded as a digital or video fluoroscopy procedure. POST PROCEDURE It will take 1-2 days for the barium to pass through your system. To facilitate this, it is important, unless otherwise directed, to increase your fluids for the next 24-48hrs and to resume your normal diet.  This test typically takes about 30 minutes to  perform. ________________________________________________________________________  If you are age 21 or older, your body mass index should be between 23-30. Your Body mass index is 25.08 kg/m. If this is out of the aforementioned range listed, please consider follow up with your Primary Care Provider.  If you are age 73 or younger, your body mass index should be between 19-25. Your Body mass index is 25.08 kg/m. If this is out of the aformentioned range listed, please consider follow up with your Primary Care Provider.   Thank you for choosing [facility]  Dr Wilfrid Lund III

## 2016-03-25 NOTE — Progress Notes (Signed)
[facility] Consult Note:  History: Renee Davidson 03/25/2016  Referring physician: Chevis Pretty, FNP  Reason for consult/chief complaint: Dysphagia (for months. Notices with "hard foods")   Subjective  HPI:  This is a 65 year old woman referred by primary care after 2 ED visits for dysphagia that may have been food impactions. She has developmental delay and most of the history is given by the 3 family members accompanying her. In April of this year and again in June she had an episode of acute feeling of food stuck in the neck or chest. She was able to breathe, but somewhat gave her the Heimlich maneuver on both occasions. She appeared normal upon arrival to the ED she was able to eat and drink well and was discharged home. Does not sound like there has been chronic dysphagia in between these episodes. Her 92 year old father lives with her and feels like she eats too quickly. Lyfe denies abdominal pain altered bowel habits or rectal bleeding. She has never had a screening colonoscopy, and her family says they have been too concerned that her fear of medical procedures would preclude doing that. ROS:  Review of Systems  Constitutional: Negative for appetite change and unexpected weight change.  HENT: Negative for mouth sores and voice change.   Eyes: Negative for pain and redness.  Respiratory: Negative for cough and shortness of breath.   Cardiovascular: Negative for chest pain and palpitations.  Genitourinary: Negative for dysuria and hematuria.  Musculoskeletal: Negative for arthralgias and myalgias.  Skin: Negative for pallor and rash.  Neurological: Negative for weakness and headaches.  Hematological: Negative for adenopathy.     Past Medical History: Past Medical History:  Diagnosis Date  . Anxiety   . Depression   . GERD (gastroesophageal reflux disease)   . Hyperlipidemia   . Mental developmental delay   . OCD (obsessive compulsive disorder)   .  Osteopenia   . Osteoporosis   . Thyroid disease      Past Surgical History: Past Surgical History:  Procedure Laterality Date  . TONSILLECTOMY       Family History: Family History  Problem Relation Age of Onset  . Diabetes Father   . Hyperlipidemia Father   . Hypertension Father   . Atrial fibrillation Father   . Dysphagia Sister   . Heart disease Maternal Grandfather   . Colon cancer Maternal Grandfather     Social History: Social History   Social History  . Marital status: Single    Spouse name: N/A  . Number of children: 0  . Years of education: N/A   Occupational History  . retired    Social History Main Topics  . Smoking status: Never Smoker  . Smokeless tobacco: Never Used  . Alcohol use No  . Drug use: No  . Sexual activity: No   Other Topics Concern  . None   Social History Narrative  . None    Allergies: No Known Allergies  Outpatient Meds: Current Outpatient Prescriptions  Medication Sig Dispense Refill  . alendronate (FOSAMAX) 70 MG tablet TAKE 1 TABLET EVERY 7 DAYS WITH A FULL GLASS OF WATER ON AN EMPTY STOMACH 12 tablet 3  . aspirin EC 81 MG tablet Take 81 mg by mouth daily.    . Calcium-Vitamin D-Vitamin K 500-100-40 MG-UNT-MCG CHEW Chew 1 tablet by mouth daily.     Marland Kitchen escitalopram (LEXAPRO) 20 MG tablet Take 1 tablet (20 mg total) by mouth daily. 30 tablet 5  . guaiFENesin (MUCINEX) 600  MG 12 hr tablet Take 600 mg by mouth 2 (two) times daily as needed for cough.    . levothyroxine (SYNTHROID, LEVOTHROID) 75 MCG tablet TAKE (1) TABLET DAILY BE- FORE BREAKFAST. 30 tablet 5  . LORazepam (ATIVAN) 0.5 MG tablet TAKE 1 TABLET THREE TIMES DAILY AS NEEDED FOR ANXIETY 90 tablet 0  . lovastatin (MEVACOR) 40 MG tablet TAKE 1 TABLET ONCE DAILY AT BEDTIME 30 tablet 5  . omeprazole (PRILOSEC) 20 MG capsule TAKE (1) CAPSULE DAILY 30 capsule 5  . Polyethylene Glycol 3350 (MIRALAX PO) Take by mouth as needed.    . risperiDONE (RISPERDAL) 0.25 MG tablet  TAKE 1 TABLET IN THE MORNING AND 2 TABLETS AT BEDTIME 90 tablet 5   No current facility-administered medications for this visit.       ___________________________________________________________________ Objective   Exam:  BP 94/68   Pulse 72   Ht 5' 4.5" (1.638 m) Comment: measured without shoes  Wt 148 lb 6 oz (67.3 kg)   BMI 25.08 kg/m    General: this is a(n) Well-appearing older woman, good muscle mass, normal vocal quality.   Eyes: sclera anicteric, no redness  ENT: oral mucosa moist without lesions, no cervical or supraclavicular lymphadenopathy, fair dentition  CV: RRR without murmur, S1/S2, no JVD, no peripheral edema  Resp: clear to auscultation bilaterally, normal RR and effort noted  GI: soft, no tenderness, with active bowel sounds. No guarding or palpable organomegaly noted.  Skin; warm and dry, no rash or jaundice noted  Neuro: awake, alert and oriented x 3. Normal gross motor function and fluent speech   Radiologic studies: Soft tissue films of the neck from 01/16/2016 were normal  Assessment: Encounter Diagnosis  Name Primary?  Marland Kitchen Dysphagia Yes    It is difficult to tell if she has a mechanical lesion such as ring or stricture, clinical picture is not highly suspicious for neoplasia. Family is very reluctant to have her go endoscopy. It sounds like these episodes have largely been inattention to safe eating habits of cutting and chewing food well and eating slowly.  Plan:  Barium swallow.  If high risk lesion found, they must reconsider endoscopy with possible dilation.  Thank you for the courtesy of this consult.  Please call me with any questions or concerns.  Nelida Meuse III  CC: [place], FNP

## 2016-03-27 ENCOUNTER — Inpatient Hospital Stay (HOSPITAL_COMMUNITY): Payer: Medicare Other

## 2016-03-27 ENCOUNTER — Emergency Department (HOSPITAL_COMMUNITY): Payer: Medicare Other

## 2016-03-27 ENCOUNTER — Encounter (HOSPITAL_COMMUNITY): Payer: Self-pay

## 2016-03-27 ENCOUNTER — Inpatient Hospital Stay (HOSPITAL_COMMUNITY)
Admission: EM | Admit: 2016-03-27 | Discharge: 2016-03-29 | DRG: 640 | Disposition: A | Discharge status: Home Health | Payer: Medicare Other | Attending: Internal Medicine | Admitting: Internal Medicine

## 2016-03-27 DIAGNOSIS — Y92002 Bathroom of unspecified non-institutional (private) residence single-family (private) house as the place of occurrence of the external cause: Secondary | ICD-10-CM

## 2016-03-27 DIAGNOSIS — Z7982 Long term (current) use of aspirin: Secondary | ICD-10-CM

## 2016-03-27 DIAGNOSIS — K219 Gastro-esophageal reflux disease without esophagitis: Secondary | ICD-10-CM | POA: Diagnosis present

## 2016-03-27 DIAGNOSIS — F429 Obsessive-compulsive disorder, unspecified: Secondary | ICD-10-CM | POA: Diagnosis present

## 2016-03-27 DIAGNOSIS — R6259 Other lack of expected normal physiological development in childhood: Secondary | ICD-10-CM | POA: Diagnosis present

## 2016-03-27 DIAGNOSIS — G9340 Encephalopathy, unspecified: Secondary | ICD-10-CM | POA: Diagnosis not present

## 2016-03-27 DIAGNOSIS — J181 Lobar pneumonia, unspecified organism: Secondary | ICD-10-CM | POA: Diagnosis not present

## 2016-03-27 DIAGNOSIS — F411 Generalized anxiety disorder: Secondary | ICD-10-CM | POA: Diagnosis present

## 2016-03-27 DIAGNOSIS — E860 Dehydration: Principal | ICD-10-CM | POA: Diagnosis present

## 2016-03-27 DIAGNOSIS — M810 Age-related osteoporosis without current pathological fracture: Secondary | ICD-10-CM | POA: Diagnosis present

## 2016-03-27 DIAGNOSIS — R0602 Shortness of breath: Secondary | ICD-10-CM

## 2016-03-27 DIAGNOSIS — R29810 Facial weakness: Secondary | ICD-10-CM | POA: Diagnosis not present

## 2016-03-27 DIAGNOSIS — E871 Hypo-osmolality and hyponatremia: Secondary | ICD-10-CM | POA: Diagnosis not present

## 2016-03-27 DIAGNOSIS — E039 Hypothyroidism, unspecified: Secondary | ICD-10-CM | POA: Diagnosis present

## 2016-03-27 DIAGNOSIS — S0990XA Unspecified injury of head, initial encounter: Secondary | ICD-10-CM | POA: Diagnosis not present

## 2016-03-27 DIAGNOSIS — E785 Hyperlipidemia, unspecified: Secondary | ICD-10-CM | POA: Diagnosis present

## 2016-03-27 DIAGNOSIS — Z7983 Long term (current) use of bisphosphonates: Secondary | ICD-10-CM | POA: Diagnosis not present

## 2016-03-27 DIAGNOSIS — I639 Cerebral infarction, unspecified: Secondary | ICD-10-CM

## 2016-03-27 DIAGNOSIS — F329 Major depressive disorder, single episode, unspecified: Secondary | ICD-10-CM | POA: Diagnosis present

## 2016-03-27 DIAGNOSIS — W19XXXA Unspecified fall, initial encounter: Secondary | ICD-10-CM | POA: Diagnosis present

## 2016-03-27 DIAGNOSIS — R4182 Altered mental status, unspecified: Secondary | ICD-10-CM | POA: Diagnosis not present

## 2016-03-27 DIAGNOSIS — I6789 Other cerebrovascular disease: Secondary | ICD-10-CM | POA: Diagnosis not present

## 2016-03-27 LAB — URINALYSIS, ROUTINE W REFLEX MICROSCOPIC
Bilirubin Urine: NEGATIVE
Glucose, UA: NEGATIVE mg/dL
Hgb urine dipstick: NEGATIVE
Ketones, ur: 15 mg/dL — AB
Leukocytes, UA: NEGATIVE
Nitrite: NEGATIVE
Protein, ur: NEGATIVE mg/dL
Specific Gravity, Urine: 1.004 — ABNORMAL LOW (ref 1.005–1.030)
pH: 7 (ref 5.0–8.0)

## 2016-03-27 LAB — CBC
HCT: 39.9 % (ref 36.0–46.0)
Hemoglobin: 14 g/dL (ref 12.0–15.0)
MCH: 30.2 pg (ref 26.0–34.0)
MCHC: 35.1 g/dL (ref 30.0–36.0)
MCV: 86 fL (ref 78.0–100.0)
Platelets: 165 10*3/uL (ref 150–400)
RBC: 4.64 MIL/uL (ref 3.87–5.11)
RDW: 11.9 % (ref 11.5–15.5)
WBC: 12.8 10*3/uL — ABNORMAL HIGH (ref 4.0–10.5)

## 2016-03-27 LAB — I-STAT CHEM 8, ED
BUN: 3 mg/dL — ABNORMAL LOW (ref 6–20)
Calcium, Ion: 0.94 mmol/L — ABNORMAL LOW (ref 1.12–1.23)
Chloride: 89 mmol/L — ABNORMAL LOW (ref 101–111)
Creatinine, Ser: 0.5 mg/dL (ref 0.44–1.00)
Glucose, Bld: 96 mg/dL (ref 65–99)
HCT: 44 % (ref 36.0–46.0)
Hemoglobin: 15 g/dL (ref 12.0–15.0)
Potassium: 3.8 mmol/L (ref 3.5–5.1)
Sodium: 123 mmol/L — ABNORMAL LOW (ref 135–145)
TCO2: 18 mmol/L (ref 0–100)

## 2016-03-27 LAB — I-STAT TROPONIN, ED: Troponin i, poc: 0 ng/mL (ref 0.00–0.08)

## 2016-03-27 LAB — COMPREHENSIVE METABOLIC PANEL
ALT: 16 U/L (ref 14–54)
AST: 37 U/L (ref 15–41)
Albumin: 4.3 g/dL (ref 3.5–5.0)
Alkaline Phosphatase: 73 U/L (ref 38–126)
Anion gap: 14 (ref 5–15)
BUN: <5 mg/dL — ABNORMAL LOW (ref 6–20)
CO2: 17 mmol/L — ABNORMAL LOW (ref 22–32)
Calcium: 9.5 mg/dL (ref 8.9–10.3)
Chloride: 91 mmol/L — ABNORMAL LOW (ref 101–111)
Creatinine, Ser: 0.83 mg/dL (ref 0.44–1.00)
GFR calc Af Amer: >60 mL/min (ref >60)
GFR calc non Af Amer: >60 mL/min (ref >60)
Glucose, Bld: 97 mg/dL (ref 65–99)
Potassium: 3.6 mmol/L (ref 3.5–5.1)
Sodium: 122 mmol/L — ABNORMAL LOW (ref 135–145)
Total Bilirubin: 1.2 mg/dL (ref 0.3–1.2)
Total Protein: 6.8 g/dL (ref 6.5–8.1)

## 2016-03-27 LAB — DIFFERENTIAL
Basophils Absolute: 0 10*3/uL (ref 0.0–0.1)
Basophils Relative: 0 %
Eosinophils Absolute: 0 10*3/uL (ref 0.0–0.7)
Eosinophils Relative: 0 %
Lymphocytes Relative: 8 %
Lymphs Abs: 1 10*3/uL (ref 0.7–4.0)
Monocytes Absolute: 0.6 10*3/uL (ref 0.1–1.0)
Monocytes Relative: 5 %
Neutro Abs: 11.2 10*3/uL — ABNORMAL HIGH (ref 1.7–7.7)
Neutrophils Relative %: 87 %

## 2016-03-27 LAB — PROTIME-INR
INR: 1.04
Prothrombin Time: 13.6 seconds (ref 11.4–15.2)

## 2016-03-27 LAB — CBG MONITORING, ED: Glucose-Capillary: 98 mg/dL (ref 65–99)

## 2016-03-27 LAB — APTT: aPTT: 33 seconds (ref 24–36)

## 2016-03-27 LAB — OSMOLALITY: Osmolality: 267 mOsm/kg — ABNORMAL LOW (ref 275–295)

## 2016-03-27 LAB — URIC ACID: Uric Acid, Serum: 2.9 mg/dL (ref 2.3–6.6)

## 2016-03-27 LAB — TSH: TSH: 1.672 u[IU]/mL (ref 0.350–4.500)

## 2016-03-27 MED ORDER — SODIUM CHLORIDE 0.9% FLUSH
3.0000 mL | Freq: Two times a day (BID) | INTRAVENOUS | Status: DC
  Administered 2016-03-27 – 2016-03-28 (×3): 3 mL via INTRAVENOUS

## 2016-03-27 MED ORDER — ONDANSETRON HCL 4 MG/2ML IJ SOLN: 4.0000 mg | Freq: Four times a day (QID) | INTRAMUSCULAR | Status: DC | PRN

## 2016-03-27 MED ORDER — BISACODYL 10 MG RE SUPP: 10.0000 mg | Freq: Every day | RECTAL | Status: DC | PRN

## 2016-03-27 MED ORDER — ONDANSETRON HCL 4 MG PO TABS: 4.0000 mg | ORAL_TABLET | Freq: Four times a day (QID) | ORAL | Status: DC | PRN

## 2016-03-27 MED ORDER — SODIUM CHLORIDE 0.9 % IV BOLUS (SEPSIS)
1000.0000 mL | Freq: Once | INTRAVENOUS | Status: AC
  Administered 2016-03-27: 1000 mL via INTRAVENOUS

## 2016-03-27 MED ORDER — ASPIRIN EC 81 MG PO TBEC
81.0000 mg | DELAYED_RELEASE_TABLET | Freq: Every day | ORAL | Status: DC
  Administered 2016-03-27 – 2016-03-29 (×3): 81 mg via ORAL
  Filled 2016-03-27 (×3): qty 1

## 2016-03-27 MED ORDER — PRAVASTATIN SODIUM 40 MG PO TABS
40.0000 mg | ORAL_TABLET | Freq: Every day | ORAL | Status: DC
  Administered 2016-03-27 – 2016-03-28 (×2): 40 mg via ORAL
  Filled 2016-03-27 (×2): qty 1

## 2016-03-27 MED ORDER — ESCITALOPRAM OXALATE 10 MG PO TABS
20.0000 mg | ORAL_TABLET | Freq: Every day | ORAL | Status: DC
  Administered 2016-03-28 – 2016-03-29 (×2): 20 mg via ORAL
  Filled 2016-03-27 (×2): qty 2

## 2016-03-27 MED ORDER — ALBUTEROL SULFATE (2.5 MG/3ML) 0.083% IN NEBU: 2.5000 mg | INHALATION_SOLUTION | RESPIRATORY_TRACT | Status: DC | PRN

## 2016-03-27 MED ORDER — LEVOTHYROXINE SODIUM 75 MCG PO TABS
75.0000 ug | ORAL_TABLET | Freq: Every day | ORAL | Status: DC
  Administered 2016-03-28 – 2016-03-29 (×2): 75 ug via ORAL
  Filled 2016-03-27 (×2): qty 1

## 2016-03-27 MED ORDER — SODIUM CHLORIDE 0.9 % IV SOLN
INTRAVENOUS | Status: DC
  Administered 2016-03-27: 23:00:00 via INTRAVENOUS

## 2016-03-27 MED ORDER — PANTOPRAZOLE SODIUM 40 MG PO TBEC
40.0000 mg | DELAYED_RELEASE_TABLET | Freq: Every day | ORAL | Status: DC
  Administered 2016-03-28 – 2016-03-29 (×2): 40 mg via ORAL
  Filled 2016-03-27 (×2): qty 1

## 2016-03-27 MED ORDER — HEPARIN SODIUM (PORCINE) 5000 UNIT/ML IJ SOLN
5000.0000 [IU] | Freq: Three times a day (TID) | INTRAMUSCULAR | Status: DC
  Administered 2016-03-27 – 2016-03-29 (×5): 5000 [IU] via SUBCUTANEOUS
  Filled 2016-03-27 (×5): qty 1

## 2016-03-27 MED ORDER — LORAZEPAM 1 MG PO TABS
0.5000 mg | ORAL_TABLET | Freq: Every day | ORAL | Status: DC
  Administered 2016-03-27 – 2016-03-28 (×2): 0.5 mg via ORAL
  Filled 2016-03-27 (×2): qty 1

## 2016-03-27 MED ORDER — HYDROCODONE-ACETAMINOPHEN 5-325 MG PO TABS: 1.0000 | ORAL_TABLET | ORAL | Status: DC | PRN

## 2016-03-27 NOTE — Progress Notes (Signed)
Code stroke called code and arrived via [facility].  AS per family LSN 36, patietn had apparently fell in the bathroom and family noted left side weakness, facial droop and aphasia.  Patietn with developmental delay.  NIHSS 15.  Cancelled at 1457

## 2016-03-27 NOTE — Progress Notes (Signed)
Pt admitted to room 6E13 from Ed via bed.  Patient alert and oriented to person, place, situation. Lives with father, developmentally delayed.  VSS. Denies pain

## 2016-03-27 NOTE — Progress Notes (Signed)
Pt gone down for MRI via bed,

## 2016-03-27 NOTE — Consult Note (Signed)
Initial Neurological Consultation                      NEURO HOSPITALIST CONSULT NOTE   Requestig physician: Dr. Oleta Mouse   Reason for Consult:  Altered mental status status post fall    HPI:                                                                                                                                          Renee Davidson is an 65 y.o. female with a known history of developmental delay and estimated IQ of 21 who fell in the bathroom today. Her father was able to help her stand and walk back into the bedroom. At this time she was noted to be somewhat agitated and was not responding in her normal fashion. The family did not appreciate any weakness of the arms or legs. They did not appreciate any facial asymmetry. However, they noted she seemed upset and was not behaving as her normal self. She was not responding appropriately when they asked her questions.   Past Medical History:  Diagnosis Date  . Anxiety   . Depression   . GERD (gastroesophageal reflux disease)   . Hyperlipidemia   . Mental developmental delay   . OCD (obsessive compulsive disorder)   . Osteopenia   . Osteoporosis   . Thyroid disease     Past Surgical History:  Procedure Laterality Date  . TONSILLECTOMY      MEDICATIONS:                                                                                                                     I have reviewed the patient's current medications.  No Known Allergies   Social History:  reports that she has never smoked. She has never used smokeless tobacco. She reports that she does not drink alcohol or use drugs.  Family History  Problem Relation Age of Onset  . Diabetes Father   . Hyperlipidemia Father   . Hypertension Father   . Atrial fibrillation Father   . Dysphagia Sister   . Heart disease Maternal Grandfather   . Colon cancer Maternal Grandfather      ROS:  History obtained from chart review  General ROS: negative for - chills, fatigue, fever, night sweats, weight gain or weight loss Psychological ROS: Developmental delay Ophthalmic ROS: negative for - blurry vision, double vision, eye pain or loss of vision ENT ROS: negative for - epistaxis, nasal discharge, oral lesions, sore throat, tinnitus or vertigo Allergy and Immunology ROS: negative for - hives or itchy/watery eyes Hematological and Lymphatic ROS: negative for - bleeding problems, bruising or swollen lymph nodes Endocrine ROS: negative for - galactorrhea, hair pattern changes, polydipsia/polyuria or temperature intolerance Respiratory ROS: negative for - cough, hemoptysis, shortness of breath or wheezing Cardiovascular ROS: negative for - chest pain, dyspnea on exertion, edema or irregular heartbeat Gastrointestinal ROS: negative for - history of reflux Genito-Urinary ROS: negative for - dysuria, hematuria, incontinence or urinary frequency/urgency Musculoskeletal ROS: negative for - joint swelling or muscular weakness Neurological ROS: as noted in HPI Dermatological ROS: negative for rash and skin lesion changes   General Exam                                                                                                      Blood pressure 125/71, pulse 88, resp. rate (!) 31, height 5\' 4"  (1.626 m), weight 71.6 kg (157 lb 13.6 oz), SpO2 100 %. HEENT-  Normocephalic, no lesions, without obvious abnormality.  Normal external eye and conjunctiva.  Normal TM's bilaterally.  Normal auditory canals and external ears. Normal external nose, mucus membranes and septum.  Normal pharynx. Cardiovascular- regular rate and rhythm, S1, S2 normal, no murmur, click, rub or gallop, pulses palpable throughout   Lungs- chest clear, no wheezing, rales, normal symmetric air entry, Heart exam - S1, S2 normal, no murmur, no gallop, rate regular Abdomen-  soft, non-tender; bowel sounds normal; no masses,  no organomegaly Extremities- less then 2 second capillary refill   Neurological Examination Mental Status: Renee Davidson appears agitated. She is not following commands to request. When her sister is brought into the room she is following commands provided by her sister. She is wiggling her toes to command and answers a few simple questions. Cranial Nerves: Peoples are equal round reactive to light. Extraocular movements are intact. There is no facial asymmetry. The tongue is midline. Motor: Antigravity in all 4 extremities. Sensory: Grossly intact. Deep Tendon Reflexes: Unable to fully assess as the patient was not able to cooperate. Plantars: Right: downgoing   Left: downgoing Cerebellar: The patient was unable to cooperate with this. Gait: normal gait and station      Lab Results: Basic Metabolic Panel:  Recent Labs Lab 03/27/16 1441  NA 123*  K 3.8  CL 89*  GLUCOSE 96  BUN 3*  CREATININE 0.50    Liver Function Tests: No results for input(s): AST, ALT, ALKPHOS, BILITOT, PROT, ALBUMIN in the last 168 hours. No results for input(s): LIPASE, AMYLASE in the last 168 hours. No results for input(s): AMMONIA in the last 168 hours.  CBC:  Recent Labs Lab 03/27/16 1435 03/27/16 1441  WBC 12.8*  --   NEUTROABS 11.2*  --   HGB 14.0  15.0  HCT 39.9 44.0  MCV 86.0  --   PLT 165  --     Cardiac Enzymes: No results for input(s): CKTOTAL, CKMB, CKMBINDEX, TROPONINI in the last 168 hours.  Lipid Panel: No results for input(s): CHOL, TRIG, HDL, CHOLHDL, VLDL, LDLCALC in the last 168 hours.  CBG:  Recent Labs Lab 03/27/16 1434  GLUCAP 98    Microbiology: Results for orders placed or performed in visit on 09/03/15  Fecal occult blood, imunochemical     Status: None   Collection Time: 09/03/15  4:49 PM  Result Value Ref Range Status   Fecal Occult Bld Negative Negative Final    Coagulation Studies:  Recent Labs   03/27/16 1435  LABPROT 13.6  INR 1.04    Imaging: Ct Head Code Stroke W/o Cm  Result Date: 03/27/2016 CLINICAL DATA:  Code stroke.  Left facial droop.  Dysphagia EXAM: CT HEAD WITHOUT CONTRAST TECHNIQUE: Contiguous axial images were obtained from the base of the skull through the vertex without intravenous contrast. COMPARISON:  None. FINDINGS: Ventricle size normal. Cerebral volume normal. Negative for acute or chronic infarction. Negative for hemorrhage or mass. Negative calvarium. Slight mucosal thickening left maxillary sinus. Normal orbit. ASPECTS [facility] Stroke Program Early CT Score) - Ganglionic level infarction (caudate, lentiform nuclei, internal capsule, insula, M1-M3 cortex): 7 - Supraganglionic infarction (M4-M6 cortex): 3 Total score (0-10 with 10 being normal): 10 IMPRESSION: 1. No acute intracranial abnormality 2. ASPECTS is 10 These results were called by telephone at the time of interpretation on 03/27/2016 at 3:01 pm to Dr. Tasia Catchings , who verbally acknowledged these results. Electronically Signed   By: Franchot Gallo M.D.   On: 03/27/2016 15:01    Assessment/Plan:  Renee Davidson is a 65 year old patient with a known baseline history of developmental delay. She was reported to have fallen down when she went to the bathroom. She subsequently was able to stand and walk back into the bedroom but appeared confused and disoriented. At present she appears to have no focal neurological deficits. There is no facial asymmetry. The presentation appears most consistent with an encephalopathy. An ischemic event cannot be completely excluded at this time. MRI will also be requested. Given that it is felt this is not ischemic in nature, and the patient appears to be largely at her baseline, it was not felt that TPA would be of any benefit.  The CT of the brain was normal. Her laboratory studies reveal the presence of mild hyponatremia as well as a slight elevation of her white blood cell count. Routine  evaluations for altered mental status will be requested.  Plan:  1. Routine evaluations for altered mental status.  2. MRI of the brain.  3. We will follow her progress with you.   James A. Tasia Catchings, M.D. Neurohospitalist Phone: [phone]  03/27/2016, 3:11 PM

## 2016-03-27 NOTE — H&P (Signed)
TRH H&P   Patient Demographics:    Renee Davidson, is a 65 y.o. female  MRN: [id number]   DOB - 01/06/1951  Admit Date - 03/27/2016  Outpatient Primary MD for the patient is [place], [place]    Patient coming from: Home  Chief Complaint  Patient presents with  . Code Stroke      HPI:    Renee Davidson  is a 65 y.o. female, With history of depression, anxiety, OCD, osteoporosis, hypothyroidism, delay in mental development who is highly functional and lives at home and ambulates without any assistance comes into the hospital after an episode of fall in the bathroom where she  [place] without hurting herself significantly, denies any syncopal episode of seizure-like activity. She was brought into the ER for evaluation where she was found to be hyponatremic, code stroke was called, head CT was unremarkable she was cleared by neurology and hospitalist team was requested to admit the patient for hyponatremiaand syncope workup.  Patient currently is back to her baseline appetite and she when she came she was quite Encephalopathic, she denies any headache,No fever or chills, no neck stiffness, no chest or abdominal pain, no palpitations or focal weakness. She does say that today she has had 3 loose bowel movements but otherwise negative review of systems.    Review of systems:    In addition to the HPI above,  No Fever-chills, No Headache, No changes with Vision or hearing, No problems swallowing food or Liquids, No Chest pain, Cough or Shortness of Breath, No Abdominal pain, No Nausea or Vommitting, Bowel movements are regular, No Blood in stool or Urine, No dysuria, No new skin rashes or bruises, No new joints  pains-aches,  No new weakness, tingling, numbness in any extremity, No recent weight gain or loss, No polyuria, polydypsia or polyphagia, No significant Mental Stressors.  A full 10 point Review of Systems was done, except as stated above, all other Review of Systems were negative.   With Past History of the following :    Past Medical History:  Diagnosis Date  . Anxiety   . Depression   . GERD (gastroesophageal reflux disease)   . Hyperlipidemia   . Mental developmental delay   . OCD (obsessive compulsive disorder)   . Osteopenia   . Osteoporosis   . Thyroid disease  Past Surgical History:  Procedure Laterality Date  . TONSILLECTOMY        Social History:     Social History  Substance Use Topics  . Smoking status: Never Smoker  . Smokeless tobacco: Never Used  . Alcohol use No         Family History :     Family History  Problem Relation Age of Onset  . Diabetes Father   . Hyperlipidemia Father   . Hypertension Father   . Atrial fibrillation Father   . Dysphagia Sister   . Heart disease Maternal Grandfather   . Colon cancer Maternal Grandfather        Home Medications:   Prior to Admission medications   Medication Sig Start Date End Date Taking? Authorizing Provider  alendronate (FOSAMAX) 70 MG tablet TAKE 1 TABLET EVERY 7 DAYS WITH A FULL GLASS OF WATER ON AN EMPTY STOMACH 09/30/15  Yes Hazelynn-Margaret Hassell Done, FNP  aspirin EC 81 MG tablet Take 81 mg by mouth daily.   Yes Historical Provider, MD  Calcium-Vitamin D-Vitamin K 500-100-40 MG-UNT-MCG CHEW Chew 1 tablet by mouth 2 (two) times daily.    Yes Historical Provider, MD  escitalopram (LEXAPRO) 20 MG tablet Take 1 tablet (20 mg total) by mouth daily. 09/30/15  Yes Meia-Margaret Hassell Done, FNP  guaiFENesin (MUCINEX) 600 MG 12 hr tablet Take 600 mg by mouth 2 (two) times daily as needed for cough.   Yes Historical Provider, MD  levothyroxine (SYNTHROID, LEVOTHROID) 75 MCG tablet TAKE (1) TABLET DAILY  BE- FORE BREAKFAST. 09/30/15  Yes Italia-Margaret Hassell Done, FNP  LORazepam (ATIVAN) 0.5 MG tablet TAKE 1 TABLET THREE TIMES DAILY AS NEEDED FOR ANXIETY 03/15/16  Yes Arleatha-Margaret Hassell Done, FNP  lovastatin (MEVACOR) 40 MG tablet TAKE 1 TABLET ONCE DAILY AT BEDTIME 09/30/15  Yes Yasmine-Margaret Hassell Done, FNP  omeprazole (PRILOSEC) 20 MG capsule TAKE (1) CAPSULE DAILY 09/30/15  Yes Philomena-Margaret Hassell Done, FNP  Polyethylene Glycol 3350 (MIRALAX PO) Take by mouth as needed.   Yes Historical Provider, MD  risperiDONE (RISPERDAL) 0.25 MG tablet TAKE 1 TABLET IN THE MORNING AND 2 TABLETS AT BEDTIME 09/30/15  Yes Tyrah-Margaret Hassell Done, FNP     Allergies:    No Known Allergies   Physical Exam:   Vitals  Blood pressure 137/86, pulse 85, resp. rate 24, height 5\' 4"  (1.626 m), weight 71.6 kg (157 lb 13.6 oz), SpO2 96 %.   1. General Elderly pleasant white female lying in bed in NAD,     2. Normal affect and insight, Not Suicidal or Homicidal, Awake Alert, Oriented X 2.  3. No F.N deficits, ALL C.Nerves Intact, Strength 5/5 all 4 extremities, Sensation intact all 4 extremities, Plantars down going.  4. Ears and Eyes appear Normal, Conjunctivae clear, PERRLA. Moist Oral Mucosa.  5. Supple Neck, No JVD, No cervical lymphadenopathy appriciated, No Carotid Bruits.  6. Symmetrical Chest wall movement, Good air movement bilaterally, CTAB.  7. RRR, No Gallops, Rubs or Murmurs, No Parasternal Heave.  8. Positive Bowel Sounds, Abdomen Soft, No tenderness, No organomegaly appriciated,No rebound -guarding or rigidity.  9.  No Cyanosis, Normal Skin Turgor, No Skin Rash or Bruise.  10. Good muscle tone,  joints appear normal , no effusions, Normal ROM.  11. No Palpable Lymph Nodes in Neck or Axillae      Data Review:    CBC  Recent Labs Lab 03/27/16 1435 03/27/16 1441  WBC 12.8*  --   HGB 14.0 15.0  HCT 39.9 44.0  PLT 165  --  MCV 86.0  --   MCH 30.2  --   MCHC 35.1  --   RDW 11.9  --   LYMPHSABS 1.0   --   MONOABS 0.6  --   EOSABS 0.0  --   BASOSABS 0.0  --    ------------------------------------------------------------------------------------------------------------------  Chemistries   Recent Labs Lab 03/27/16 1435 03/27/16 1441  NA 122* 123*  K 3.6 3.8  CL 91* 89*  CO2 17*  --   GLUCOSE 97 96  BUN <5* 3*  CREATININE 0.83 0.50  CALCIUM 9.5  --   AST 37  --   ALT 16  --   ALKPHOS 73  --   BILITOT 1.2  --    ------------------------------------------------------------------------------------------------------------------ estimated creatinine clearance is 66.3 mL/min (by C-G formula based on SCr of 0.8 mg/dL). ------------------------------------------------------------------------------------------------------------------ No results for input(s): TSH, T4TOTAL, T3FREE, THYROIDAB in the last 72 hours.  Invalid input(s): FREET3  Coagulation profile  Recent Labs Lab 03/27/16 1435  INR 1.04   ------------------------------------------------------------------------------------------------------------------- No results for input(s): DDIMER in the last 72 hours. -------------------------------------------------------------------------------------------------------------------  Cardiac Enzymes No results for input(s): CKMB, TROPONINI, MYOGLOBIN in the last 168 hours.  Invalid input(s): CK ------------------------------------------------------------------------------------------------------------------ No results found for: BNP   ---------------------------------------------------------------------------------------------------------------  Urinalysis    Component Value Date/Time   COLORURINE YELLOW 03/27/2016 [place] 03/27/2016 1515   LABSPEC 1.004 (L) 03/27/2016 1515   PHURINE 7.0 03/27/2016 1515   GLUCOSEU NEGATIVE 03/27/2016 1515   HGBUR NEGATIVE 03/27/2016 1515   BILIRUBINUR NEGATIVE 03/27/2016 1515   KETONESUR 15 (A) 03/27/2016 1515    PROTEINUR NEGATIVE 03/27/2016 1515   NITRITE NEGATIVE 03/27/2016 1515   LEUKOCYTESUR NEGATIVE 03/27/2016 1515    ----------------------------------------------------------------------------------------------------------------   Imaging Results:    Ct Head Code Stroke W/o Cm  Result Date: 03/27/2016 CLINICAL DATA:  Code stroke.  Left facial droop.  Dysphagia EXAM: CT HEAD WITHOUT CONTRAST TECHNIQUE: Contiguous axial images were obtained from the base of the skull through the vertex without intravenous contrast. COMPARISON:  None. FINDINGS: Ventricle size normal. Cerebral volume normal. Negative for acute or chronic infarction. Negative for hemorrhage or mass. Negative calvarium. Slight mucosal thickening left maxillary sinus. Normal orbit. ASPECTS [facility] Stroke Program Early CT Score) - Ganglionic level infarction (caudate, lentiform nuclei, internal capsule, insula, M1-M3 cortex): 7 - Supraganglionic infarction (M4-M6 cortex): 3 Total score (0-10 with 10 being normal): 10 IMPRESSION: 1. No acute intracranial abnormality 2. ASPECTS is 10 These results were called by telephone at the time of interpretation on 03/27/2016 at 3:01 pm to Dr. Tasia Catchings , who verbally acknowledged these results. Electronically Signed   By: Franchot Gallo M.D.   On: 03/27/2016 15:01    My personal review of EKG: Rhythm poor baseline , no Acute ST changes   Assessment & Plan:      1. Syncope and fall.  Likely due to dehydration from recent diarrhea which seems to have resolved, admit to telemetry, head CT nonacute, no focal deficits, she is back to her baseline,  Check orthostatics, gentle hydration, monitor BMP closely.Note she has been seen by neurology and MRI brain has been requested which has been ordered.  2. Hyponatremia. Likely due to dehydration from diarrhea along with excessive free water intake patient drinks about 10-12 cups of water at baseline but no change in that pattern, urine sodium and os molality,  serum hospitality, for now gentle hydration and monitor BMP closely.  3. Hypo thyroidism. Check TSH continue home dose Synthroid.  4. OCD, anxiety &  depression. Stable continue Ativan to prevent abrupt withdrawal, we will hold Risperdal tonight as she was confused when she came currently much better.  5.Dyslipidemia. Continue home dose statin.   DVT Prophylaxis Heparin    AM Labs Ordered, also please review Full Orders  Family Communication: Admission, patients condition and plan of care including tests being ordered have been discussed with the patient and family who indicate understanding and agree with the plan and Code Status.  Code Status Full  Likely DC to  Home 1-2 days  Condition GUARDED    Consults called: Neuro    Admission status: Inpt    Time spent in minutes : 35   Gilberto Streck K M.D on 03/27/2016 at 5:09 PM  Between 7am to 7pm - Pager - [phone]. After 7pm go to [email or web address] - password [facility]  [facility] - Office  [phone]

## 2016-03-27 NOTE — ED Provider Notes (Signed)
[facility] DEPT Provider Note   CSN: [id number] Arrival date & time: 03/27/16  1431     History   Chief Complaint Chief Complaint  Patient presents with  . Code Stroke    HPI Renee Davidson is a 65 y.o. female.  HPI 65 year old female Female who presents as code stroke with altered mental status. She has a history of developmental delay with reported IQ of 28, anxiety, depression, hyperlipidemia. History is provided by EMS and family. States that she was in her usual state of health this morning. States that she complained of having large amount of diarrhea at around lunchtime today. Had a fall today while in the shower, and subsequently was noted by family to be weak, fatigued, and altered. She had slurring of her speech with questionable left-sided facial droop. Was not answering questions appropriately, she was not able to walk. Family carried Her out of the bathroom. Past Medical History:  Diagnosis Date  . Anxiety   . Depression   . GERD (gastroesophageal reflux disease)   . Hyperlipidemia   . Mental developmental delay   . OCD (obsessive compulsive disorder)   . Osteopenia   . Osteoporosis   . Thyroid disease     Patient Active Problem List   Diagnosis Date Noted  . Hyponatremia 03/27/2016  . Encephalopathy 03/27/2016  . Hypothyroidism 01/17/2014  . Hyperlipidemia with target LDL less than 100 01/17/2014  . GAD (generalized anxiety disorder) 01/17/2014  . GERD (gastroesophageal reflux disease) 01/17/2014  . OCD (obsessive compulsive disorder) 01/17/2014  . Osteoporosis, post-menopausal 12/05/2013    Past Surgical History:  Procedure Laterality Date  . TONSILLECTOMY      OB History    No data available       Home Medications    Prior to Admission medications   Medication Sig Start Date End Date Taking? Authorizing Provider  alendronate (FOSAMAX) 70 MG tablet TAKE 1 TABLET EVERY 7 DAYS WITH A FULL GLASS OF WATER ON AN EMPTY STOMACH 09/30/15  Yes  Samarra-Margaret Hassell Done, FNP  aspirin EC 81 MG tablet Take 81 mg by mouth daily.   Yes Historical Provider, MD  Calcium-Vitamin D-Vitamin K 500-100-40 MG-UNT-MCG CHEW Chew 1 tablet by mouth 2 (two) times daily.    Yes Historical Provider, MD  escitalopram (LEXAPRO) 20 MG tablet Take 1 tablet (20 mg total) by mouth daily. 09/30/15  Yes Kallie-Margaret Hassell Done, FNP  guaiFENesin (MUCINEX) 600 MG 12 hr tablet Take 600 mg by mouth 2 (two) times daily as needed for cough.   Yes Historical Provider, MD  levothyroxine (SYNTHROID, LEVOTHROID) 75 MCG tablet TAKE (1) TABLET DAILY BE- FORE BREAKFAST. 09/30/15  Yes Cadey-Margaret Hassell Done, FNP  LORazepam (ATIVAN) 0.5 MG tablet TAKE 1 TABLET THREE TIMES DAILY AS NEEDED FOR ANXIETY 03/15/16  Yes Mindie-Margaret Hassell Done, FNP  lovastatin (MEVACOR) 40 MG tablet TAKE 1 TABLET ONCE DAILY AT BEDTIME 09/30/15  Yes Kahmya-Margaret Hassell Done, FNP  omeprazole (PRILOSEC) 20 MG capsule TAKE (1) CAPSULE DAILY 09/30/15  Yes Rosina-Margaret Hassell Done, FNP  Polyethylene Glycol 3350 (MIRALAX PO) Take by mouth as needed.   Yes Historical Provider, MD  risperiDONE (RISPERDAL) 0.25 MG tablet TAKE 1 TABLET IN THE MORNING AND 2 TABLETS AT BEDTIME 09/30/15  Yes Tynlee-Margaret Hassell Done, FNP    Family History Family History  Problem Relation Age of Onset  . Diabetes Father   . Hyperlipidemia Father   . Hypertension Father   . Atrial fibrillation Father   . Dysphagia Sister   . Heart disease  Maternal Grandfather   . Colon cancer Maternal Grandfather     Social History Social History  Substance Use Topics  . Smoking status: Never Smoker  . Smokeless tobacco: Never Used  . Alcohol use No     Allergies   Review of patient's allergies indicates no known allergies.   Review of Systems Review of Systems  Unable to perform ROS: Mental status change     Physical Exam Updated Vital Signs BP (!) 96/45 (BP Location: Right Arm)   Pulse 68   Temp 98.1 F (36.7 C) (Oral)   Resp 18   Ht 5\' 4"  (1.626  m)   Wt 143 lb 11.8 oz (65.2 kg)   SpO2 98%   BMI 24.67 kg/m   Physical Exam  Physical Exam  Nursing note and vitals reviewed. Constitutional:  non-toxic, and in no acute distress Head: Normocephalic and atraumatic.  Mouth/Throat: Oropharynx is clear and moist.  Neck: Normal range of motion. Neck supple.  Cardiovascular: Normal rate and regular rhythm.   Pulmonary/Chest: Effort normal and breath sounds normal.  Abdominal: Soft. There is no tenderness. There is no rebound and no guarding.  Musculoskeletal: No deformity  Neurological: Alert, oriented to name, word finding difficulty, inability to answer any further questions appropriately, no facial droop, fluent speech, moves all extremities symmetrically, sensation to light touch in tact throughout Skin: Skin is warm and dry.  Psychiatric: Cooperative  ED Treatments / Results  Labs (all labs ordered are listed, but only abnormal results are displayed) Labs Reviewed  CBC - Abnormal; Notable for the following:       Result Value   WBC 12.8 (*)    All other components within normal limits  DIFFERENTIAL - Abnormal; Notable for the following:    Neutro Abs 11.2 (*)    All other components within normal limits  COMPREHENSIVE METABOLIC PANEL - Abnormal; Notable for the following:    Sodium 122 (*)    Chloride 91 (*)    CO2 17 (*)    BUN <5 (*)    All other components within normal limits  URINALYSIS, ROUTINE W REFLEX MICROSCOPIC (NOT AT [facility]) - Abnormal; Notable for the following:    Specific Gravity, Urine 1.004 (*)    Ketones, ur 15 (*)    All other components within normal limits  OSMOLALITY - Abnormal; Notable for the following:    Osmolality 267 (*)    All other components within normal limits  BASIC METABOLIC PANEL - Abnormal; Notable for the following:    Chloride 113 (*)    BUN 5 (*)    Anion gap 4 (*)    All other components within normal limits  OSMOLALITY, URINE - Abnormal; Notable for the following:     Osmolality, Ur 153 (*)    All other components within normal limits  I-STAT CHEM 8, ED - Abnormal; Notable for the following:    Sodium 123 (*)    Chloride 89 (*)    BUN 3 (*)    Calcium, Ion 0.94 (*)    All other components within normal limits  URINE CULTURE  PROTIME-INR  APTT  URIC ACID  CBC  TSH  SODIUM, URINE, RANDOM  OSMOLALITY, URINE  SODIUM, URINE, RANDOM  I-STAT TROPOININ, ED  CBG MONITORING, ED    EKG  EKG Interpretation  Date/Time:  Saturday March 27 2016 14:58:17 EDT Ventricular Rate:  90 PR Interval:    QRS Duration: 128 QT Interval:  396 QTC Calculation: 485 R Axis:  21 Text Interpretation:  Atrial fibrillation Nonspecific intraventricular conduction delay Sinus rhythm with wanding baseline  Confirmed by Lashan Gluth MD, Hinton Dyer [phone]) on 03/27/2016 3:38:31 PM       Radiology Mr Brain Wo Contrast  Result Date: 03/27/2016 CLINICAL DATA:  65 y/o F; episode of fall in the bathroom, patient denies syncope or seizure-like activity, found to be hyponatremic. EXAM: MRI HEAD WITHOUT CONTRAST TECHNIQUE: Multiplanar, multiecho pulse sequences of the brain and surrounding structures were obtained without intravenous contrast. COMPARISON:  03/27/2016 CT of head. FINDINGS: Moderate motion artifact of all sequences. Brain: No diffusion restriction to suggest acute infarct. No abnormal susceptibility hypointensity to indicate intracranial hemorrhage. No significant T2 FLAIR signal abnormality. No focal mass effect. Mild parenchymal volume loss. Extra-axial space: No hydrocephalus. No extra-axial collection is identified. Proximal intracranial flow voids are maintained. Other: No abnormal signal of the paranasal sinuses. No abnormal signal of the mastoid air cells. Orbits are unremarkable. Calvarium is unremarkable. IMPRESSION: No acute intracranial abnormality. No evidence for osmotic demyelination. Unremarkable MRI head for patient's age. Electronically Signed   By: Kristine Garbe M.D.   On: 03/27/2016 19:54   Dg Chest Port 1 View  Result Date: 03/27/2016 CLINICAL DATA:  Disorientation and hyperventilation. EXAM: PORTABLE CHEST 1 VIEW COMPARISON:  01/16/2016 FINDINGS: Cardiomediastinal silhouette is normal. Mediastinal contours appear intact. There is no evidence of pleural effusion or pneumothorax. Subtle airspace consolidation in the right upper lobe and left lower lobe. Osseous structures are without acute abnormality. Soft tissues are grossly normal. IMPRESSION: Subtle airspace consolidation in the right upper lobe and left lower lobe, which may represent atelectasis versus airspace disease. Electronically Signed   By: Fidela Salisbury M.D.   On: 03/27/2016 18:12   Ct Head Code Stroke W/o Cm  Result Date: 03/27/2016 CLINICAL DATA:  Code stroke.  Left facial droop.  Dysphagia EXAM: CT HEAD WITHOUT CONTRAST TECHNIQUE: Contiguous axial images were obtained from the base of the skull through the vertex without intravenous contrast. COMPARISON:  None. FINDINGS: Ventricle size normal. Cerebral volume normal. Negative for acute or chronic infarction. Negative for hemorrhage or mass. Negative calvarium. Slight mucosal thickening left maxillary sinus. Normal orbit. ASPECTS [facility] Stroke Program Early CT Score) - Ganglionic level infarction (caudate, lentiform nuclei, internal capsule, insula, M1-M3 cortex): 7 - Supraganglionic infarction (M4-M6 cortex): 3 Total score (0-10 with 10 being normal): 10 IMPRESSION: 1. No acute intracranial abnormality 2. ASPECTS is 10 These results were called by telephone at the time of interpretation on 03/27/2016 at 3:01 pm to Dr. Tasia Catchings , who verbally acknowledged these results. Electronically Signed   By: Franchot Gallo M.D.   On: 03/27/2016 15:01    Procedures Procedures (including critical care time) CRITICAL CARE Performed by: Forde Dandy   Total critical care time: 35 minutes  Critical care time was exclusive of  separately billable procedures and treating other patients.  Critical care was necessary to treat or prevent imminent or life-threatening deterioration.  Critical care was time spent personally by me on the following activities: development of treatment plan with patient and/or surrogate as well as nursing, discussions with consultants, evaluation of patient's response to treatment, examination of patient, obtaining history from patient or surrogate, ordering and performing treatments and interventions, ordering and review of laboratory studies, ordering and review of radiographic studies, pulse oximetry and re-evaluation of patient's condition.  Medications Ordered in ED Medications  aspirin EC tablet 81 mg (81 mg Oral Given 03/28/16 0911)  escitalopram (LEXAPRO) tablet 20 mg (20  mg Oral Given 03/28/16 0911)  levothyroxine (SYNTHROID, LEVOTHROID) tablet 75 mcg (75 mcg Oral Given 03/28/16 0819)  pravastatin (PRAVACHOL) tablet 40 mg (40 mg Oral Given 03/27/16 2241)  pantoprazole (PROTONIX) EC tablet 40 mg (40 mg Oral Given 03/28/16 0911)  LORazepam (ATIVAN) tablet 0.5 mg (0.5 mg Oral Given 03/27/16 2241)  HYDROcodone-acetaminophen (NORCO/VICODIN) 5-325 MG per tablet 1-2 tablet (not administered)  ondansetron (ZOFRAN) tablet 4 mg (not administered)    Or  ondansetron (ZOFRAN) injection 4 mg (not administered)  heparin injection 5,000 Units (5,000 Units Subcutaneous Given 03/28/16 1459)  sodium chloride flush (NS) 0.9 % injection 3 mL (3 mLs Intravenous Given 03/28/16 0912)  bisacodyl (DULCOLAX) suppository 10 mg (not administered)  albuterol (PROVENTIL) (2.5 MG/3ML) 0.083% nebulizer solution 2.5 mg (not administered)  sodium chloride 0.9 % bolus 1,000 mL (0 mLs Intravenous Stopped 03/27/16 1640)  sodium chloride 0.9 % bolus 500 mL (500 mLs Intravenous Given 03/28/16 0911)     Initial Impression / Assessment and Plan / ED Course  I have reviewed the triage vital signs and the nursing  notes.  Pertinent labs & imaging results that were available during my care of the patient were reviewed by me and considered in my medical decision making (see chart for details).  Clinical Course   74 -year-old female with history of developmental delay who presents with altered mental status after fall. Initially code stroke, neurology presents for evaluation. She does have some slurring of her speech and difficulty answering questions which is different for her, but this was felt to be more due to a encephalopathic reason rather than true stroke by neurology.  Her CT head does not show acute intracranial processes. She has no other major deficits neurologically noted on exam. Code stroke canceled, and neurology recommending MRI.   She does have significant hyponatremia with a sodium of 122 which can likely cause her altered mental status. According to family members she drinks a significant amount of water at home and this may be her likely etiology. She did initially receive a liter of IV fluids. No other major metabolic or electrolyte derangements noted. No evidence of infection on chest x-ray or UA. I discussed with the hospitalist service who will admit for management of hyponatremia and ongoing management.   Final Clinical Impressions(s) / ED Diagnoses   Final diagnoses:  Stroke [facility])  Hyponatremia    New Prescriptions Current Discharge Medication List       Forde Dandy, MD 03/28/16 1649

## 2016-03-27 NOTE — ED Triage Notes (Signed)
[facility]- pt coming from home as code stroke, hx of developmental delay and OCD. Pt is ambulatory and alert and oriented at baseline. Family called out for left side weakness, facial droop and aphasia. Pt is alert on arrival and able to state her name.

## 2016-03-28 LAB — CBC
HCT: 39.1 % (ref 36.0–46.0)
Hemoglobin: 13.1 g/dL (ref 12.0–15.0)
MCH: 29.8 pg (ref 26.0–34.0)
MCHC: 33.5 g/dL (ref 30.0–36.0)
MCV: 89.1 fL (ref 78.0–100.0)
Platelets: 171 10*3/uL (ref 150–400)
RBC: 4.39 MIL/uL (ref 3.87–5.11)
RDW: 12.5 % (ref 11.5–15.5)
WBC: 5.6 10*3/uL (ref 4.0–10.5)

## 2016-03-28 LAB — SODIUM, URINE, RANDOM: Sodium, Ur: 38 mmol/L

## 2016-03-28 LAB — BASIC METABOLIC PANEL
Anion gap: 4 — ABNORMAL LOW (ref 5–15)
BUN: 5 mg/dL — ABNORMAL LOW (ref 6–20)
CO2: 22 mmol/L (ref 22–32)
Calcium: 9.1 mg/dL (ref 8.9–10.3)
Chloride: 113 mmol/L — ABNORMAL HIGH (ref 101–111)
Creatinine, Ser: 0.83 mg/dL (ref 0.44–1.00)
GFR calc Af Amer: >60 mL/min (ref >60)
GFR calc non Af Amer: >60 mL/min (ref >60)
Glucose, Bld: 85 mg/dL (ref 65–99)
Potassium: 3.8 mmol/L (ref 3.5–5.1)
Sodium: 139 mmol/L (ref 135–145)

## 2016-03-28 LAB — URINE CULTURE: Culture: NO GROWTH

## 2016-03-28 LAB — OSMOLALITY, URINE: Osmolality, Ur: 153 mOsm/kg — ABNORMAL LOW (ref 300–900)

## 2016-03-28 MED ORDER — SODIUM CHLORIDE 0.9 % IV BOLUS (SEPSIS)
500.0000 mL | Freq: Once | INTRAVENOUS | Status: AC
  Administered 2016-03-28: 500 mL via INTRAVENOUS

## 2016-03-28 NOTE — Progress Notes (Signed)
   03/28/16 0800  Charting Type  Charting Type Shift assessment  Orders Chart Check (once per shift) Completed  Neurological  Level of Consciousness Alert  Orientation Level Oriented to person;Oriented to place;Disoriented to time;Disoriented to situation  Cognition Appropriate at baseline  Speech Appropriate at baseline  Pupil Assessment  Yes  R Pupil Size (mm) 3  R Pupil Shape Round  R Pupil Reaction Brisk  L Pupil Size (mm) 3  L Pupil Shape Round  L Pupil Reaction Brisk  Motor Function/Sensation Assessment Grip;Motor strength  R Hand Grip Present;Moderate  L Hand Grip Present;Moderate   RUE Motor Strength 5  LUE Motor Strength 5  RLE Motor Strength 5  LLE Motor Strength 5  Glasgow Coma Scale  Eye Opening 4  Best Verbal Response (NON-intubated) 4  Best Motor Response 6  Glasgow Coma Scale Score 14  HEENT  HEENT (WDL) WDL  Respiratory  Respiratory Pattern Regular;Unlabored  R Upper  Breath Sounds Clear  L Upper Breath Sounds Clear  R Lower Breath Sounds Clear;Diminished  L Lower Breath Sounds Clear;Diminished  Cardiac  Cardiac (WDL) WDL  Vascular  Vascular (WDL) WDL  Integumentary  Integumentary (WDL) WDL  Braden Scale (Ages 8 and up)  Sensory Perceptions 4  Moisture 3  Activity 3  Mobility 3  Nutrition 3  Friction and Shear 3  Braden Scale Score 19  Musculoskeletal  Musculoskeletal (WDL) WDL  GU Assessment  Genitourinary (WDL) X  Genitourinary Symptoms Increased frequency  Genitalia  Female Genitalia Intact  Urine Characteristics  Urinary Incontinence Yes  Psychosocial  Psychosocial (WDL) WDL  Patient Behaviors Cooperative;Calm  Needs Expressed Denies  MD updated pt with soft BPs and increased U/O

## 2016-03-28 NOTE — Evaluation (Signed)
Physical Therapy Evaluation Patient Details Name: Renee Davidson MRN: [id number] DOB: 10/10/1950 Today's Date: 03/28/2016   History of Present Illness  65 year old female Female who presents as code stroke with altered mental status. She has a history of developmental delay with reported IQ of 32, anxiety, depression, hyperlipidemia. History is provided by EMS and family. States that she was in her usual state of health this morning. States that she complained of having large amount of diarrhea at around lunchtime today. Had a fall today while in the shower, and subsequently was noted by family to be weak, fatigued, and altered. She had slurring of her speech with questionable left-sided facial droop. Was not answering questions appropriately, she was not able to walk. CVA workup with inconclusive results.  Clinical Impression  Pt with slowed mobility, she reports she feels her walking is off primarily because she has not mobilized much during hospital stay.  She may benefit from short bout of PT to ensure safe discharge home with family.  Will attempt to practice stairs prior to discharge home.  Will continue to follow patient while on this venue of care to progress mobility. Unclear how much assist her father is able to provide at home.  She has a sister that lives relatively nearby.    Follow Up Recommendations No PT follow up    Equipment Recommendations  Cane    Recommendations for Other Services       Precautions / Restrictions Precautions Precautions: Fall Precaution Comments: prior fall Restrictions Weight Bearing Restrictions: No      Mobility  Bed Mobility Overal bed mobility: Independent             General bed mobility comments: supine to/from sit  Transfers Overall transfer level: Independent Equipment used: None             General transfer comment: LOB to left and posteriorly upon initial sit to stand  Ambulation/Gait Ambulation/Gait assistance:  Supervision Ambulation Distance (Feet): 300 Feet Assistive device: None Gait Pattern/deviations: Shuffle Gait velocity: .85 ft/sec Gait velocity interpretation: <1.8 ft/sec, indicative of risk for recurrent falls General Gait Details: slowed gait pattern, guarded arm posture bil  Stairs            Wheelchair Mobility    Modified Rankin (Stroke Patients Only) Modified Rankin (Stroke Patients Only) Pre-Morbid Rankin Score: No symptoms Modified Rankin: No significant disability     Balance Overall balance assessment: Needs assistance Sitting-balance support: Feet supported;No upper extremity supported Sitting balance-Leahy Scale: Good     Standing balance support: No upper extremity supported Standing balance-Leahy Scale: Fair Standing balance comment: LOB to left upon initial sit to stand                             Pertinent Vitals/Pain Pain Assessment: No/denies pain    Home Living Family/patient expects to be discharged to:: Private residence Living Arrangements: Parent Available Help at Discharge: Family;Available 24 hours/day Type of Home: House Home Access: Stairs to enter Entrance Stairs-Rails: Left Entrance Stairs-Number of Steps: 3 Home Layout: One level Home Equipment: Tub bench Additional Comments: independent without device prior to admission    Prior Function Level of Independence: Needs assistance   Gait / Transfers Assistance Needed: supervision secondary to cognition  ADL's / Homemaking Assistance Needed: pt does household cleaning and laundry, father does cooking        Journalist, newspaper  Extremity/Trunk Assessment   Upper Extremity Assessment: Overall WFL for tasks assessed           Lower Extremity Assessment: Overall WFL for tasks assessed (4+/5 hip flexion, knee ext, knee flexion, ankle DF bil)      Cervical / Trunk Assessment: Kyphotic  Communication   Communication: No difficulties  Cognition  Arousal/Alertness: Awake/alert Behavior During Therapy: WFL for tasks assessed/performed Overall Cognitive Status: History of cognitive impairments - at baseline                      General Comments General comments (skin integrity, edema, etc.): Pt's sister will stay with her overnight tonight in hospital per pt report    Exercises        Assessment/Plan    PT Assessment Patient needs continued PT services  PT Diagnosis Difficulty walking;Generalized weakness   PT Problem List Decreased activity tolerance;Decreased balance;Decreased mobility;Decreased cognition  PT Treatment Interventions Gait training;Stair training;Therapeutic activities;Functional mobility training;Therapeutic exercise;Balance training;Neuromuscular re-education;Patient/family education   PT Goals (Current goals can be found in the Care Plan section) Acute Rehab PT Goals Patient Stated Goal: return home to see my dog PT Goal Formulation: With patient Time For Goal Achievement: 04/05/16 Potential to Achieve Goals: Good    Frequency Min 3X/week   Barriers to discharge        Co-evaluation               End of Session Equipment Utilized During Treatment: Gait belt Activity Tolerance: Patient tolerated treatment well;No increased pain Patient left: in bed;with call bell/phone within reach;with bed alarm set Nurse Communication: Mobility status         Time: [id number] PT Time Calculation (min) (ACUTE ONLY): 23 min   Charges:   PT Evaluation $PT Eval Low Complexity: 1 Procedure     PT G CodesMalka So, PT [phone]  [place] 03/28/2016, 11:25 AM

## 2016-03-28 NOTE — Progress Notes (Signed)
Subjective:  Renee Davidson is alert and responsive today. She appears back to her baseline. She reports that she has difficulty remembering the events that were occurring yesterday. Her sister reports that she was actually quite anxious during her evaluation in the emergency room as well.  Exam: Vitals:   03/28/16 0459 03/28/16 0628  BP: (!) 88/49 (!) 96/45  Pulse: 85 68  Resp: 19 18  Temp: 98 F (36.7 C)     HEENT-  Normocephalic, no lesions, without obvious abnormality.  Normal external eye and conjunctiva.  Normal TM's bilaterally.  Normal auditory canals and external ears. Normal external nose, mucus membranes and septum.  Normal pharynx. Cardiovascular- regular rate and rhythm, S1, S2 normal, no murmur, click, rub or gallop, pulses palpable throughout   Lungs- chest clear, no wheezing, rales, normal symmetric air entry, Heart exam - S1, S2 normal, no murmur, no gallop, rate regular Abdomen- soft, non-tender; bowel sounds normal; no masses,  no organomegaly Extremities- less then 2 second capillary refill   Gen: In bed, NAD MS: Awake alert and responsive. CN: Cranial nerves II through XII are intact. Motor: 5 out of 5 bilaterally. Sensory: Intact bilaterally. DTR: 1-2+.  Pertinent Labs/Diagnostics: Reviewed.    Impression:   [name] appears back to her baseline at this point. She has no focal neurological deficits. Her cranial nerve exam is normal. It is suspected she may have experienced some hyponatremia related to dehydration and the diarrhea. She presently appears back to her baseline. MRI of the brain was normal.   Recommendations:  1. [name] appears back to her baseline at this point. Cleared for discharge from the neurology standpoint if cleared by medicine service.   Charrise Lardner A. Tasia Catchings, M.D. Neurohospitalist Phone: [phone]   03/28/2016, 8:09 AM

## 2016-03-28 NOTE — Progress Notes (Signed)
PROGRESS NOTE                                                                                                                                                                                                             Patient Demographics:    Renee Davidson, is a 65 y.o. female, DOB - 08/15/1950, [id number]  Admit date - 03/27/2016   Admitting Physician Thurnell Lose, MD  Outpatient Primary MD for the patient is Davis, Monette  LOS - 1  Chief Complaint  Patient presents with  . Code Stroke       Brief Narrative    Renee Davidson  is a 65 y.o. female, With history of depression, anxiety, OCD, osteoporosis, hypothyroidism, delay in mental development who is highly functional and lives at home and ambulates without any assistance comes into the hospital after an episode of fall in the bathroom where she  [place] without hurting herself significantly, denies any syncopal episode of seizure-like activity. She was brought into the ER for evaluation where she was found to be hyponatremic, code stroke was called, head CT was unremarkable she was cleared by neurology and hospitalist team was requested to admit the patient for hyponatremiaand syncope workup.  Patient currently is back to her baseline appetite and she when she came she was quite Encephalopathic, she denies any headache,No fever or chills, no neck stiffness, no chest or abdominal pain, no palpitations or focal weakness. She does say that today she has had 3 loose bowel movements but otherwise negative review of systems.    Subjective:    [facility] today has, No headache, No chest pain, No abdominal pain - No Nausea, No new weakness tingling or numbness, No Cough - SOB.  Feels generally weak overall better than what she came in with.   Assessment  & Plan :     1. Syncope and fall.   due to severe dehydration with hypotension, improving with IV fluids, still  has low blood pressure will get another normal saline bolus as she feels weak and she is hypotensive, he had MRI brain nonacute. Increase activity PT evaluation if stable discharge in the morning.  2. Hyponatremia. Acute due to dehydration from diarrhea prior to admission, improved after IV fluids. Still needs IV fluids as she continues to be hypotensive and symptomatic.  3. Hypothyroidism.  Able TSH at 1.6 continue home dose Synthroid.  4. OCD, anxiety & depression. Stable continue Ativan to prevent abrupt withdrawal, we will hold Risperdal tonight as she was confused when she came currently much better.  5. Dyslipidemia. Continue home dose statin.     Family Communication  :  daughter  Code Status :  Full  Diet : Heart Healthy  Disposition Plan  :  Home in am  Consults  :  Neuro  Procedures  :    CT head and MRI brain nonacute.  DVT Prophylaxis  :    Heparin    Lab Results  Component Value Date   PLT 171 03/28/2016    Inpatient Medications  Scheduled Meds: . aspirin EC  81 mg Oral Daily  . escitalopram  20 mg Oral Daily  . heparin  5,000 Units Subcutaneous Q8H  . levothyroxine  75 mcg Oral QAC breakfast  . LORazepam  0.5 mg Oral QHS  . pantoprazole  40 mg Oral Daily  . pravastatin  40 mg Oral q1800  . sodium chloride  500 mL Intravenous Once  . sodium chloride flush  3 mL Intravenous Q12H   Continuous Infusions:  PRN Meds:.albuterol, bisacodyl, HYDROcodone-acetaminophen, ondansetron **OR** ondansetron (ZOFRAN) IV  Antibiotics  :    Anti-infectives    None         Objective:   Vitals:   03/27/16 2134 03/28/16 0459 03/28/16 0628 03/28/16 0800  BP:  (!) 88/49 (!) 96/45   Pulse:  85 68   Resp:  19 18   Temp:  98 F (36.7 C)  98.1 F (36.7 C)  TempSrc:  Oral  Oral  SpO2:  100% 95% 98%  Weight: 65.2 kg (143 lb 11.8 oz)     Height:        Wt Readings from Last 3 Encounters:  03/27/16 65.2 kg (143 lb 11.8 oz)  03/25/16 67.3 kg (148 lb 6 oz)    01/16/16 63.5 kg (140 lb)     Intake/Output Summary (Last 24 hours) at 03/28/16 0958 Last data filed at 03/28/16 [id number]  Gross per 24 hour  Intake              670 ml  Output             1600 ml  Net             -930 ml     Physical Exam  Awake Alert, Oriented X 3, No new F.N deficits, Normal affect [place].AT,PERRAL Supple Neck,No JVD, No cervical lymphadenopathy appriciated.  Symmetrical Chest wall movement, Good air movement bilaterally, CTAB RRR,No Gallops,Rubs or new Murmurs, No Parasternal Heave +ve B.Sounds, Abd Soft, No tenderness, No organomegaly appriciated, No rebound - guarding or rigidity. No Cyanosis, Clubbing or edema, No new Rash or bruise       Data Review:    CBC  Recent Labs Lab 03/27/16 1435 03/27/16 1441 03/28/16 0522  WBC 12.8*  --  5.6  HGB 14.0 15.0 13.1  HCT 39.9 44.0 39.1  PLT 165  --  171  MCV 86.0  --  89.1  MCH 30.2  --  29.8  MCHC 35.1  --  33.5  RDW 11.9  --  12.5  LYMPHSABS 1.0  --   --   MONOABS 0.6  --   --   EOSABS 0.0  --   --   BASOSABS 0.0  --   --     Chemistries   Recent Labs  Lab 03/27/16 1435 03/27/16 1441 03/28/16 0522  NA 122* 123* 139  K 3.6 3.8 3.8  CL 91* 89* 113*  CO2 17*  --  22  GLUCOSE 97 96 85  BUN <5* 3* 5*  CREATININE 0.83 0.50 0.83  CALCIUM 9.5  --  9.1  AST 37  --   --   ALT 16  --   --   ALKPHOS 73  --   --   BILITOT 1.2  --   --    ------------------------------------------------------------------------------------------------------------------ No results for input(s): CHOL, HDL, LDLCALC, TRIG, CHOLHDL, LDLDIRECT in the last 72 hours.  No results found for: HGBA1C ------------------------------------------------------------------------------------------------------------------  Recent Labs  03/27/16 2012  TSH 1.672   ------------------------------------------------------------------------------------------------------------------ No results for input(s): VITAMINB12, FOLATE, FERRITIN,  TIBC, IRON, RETICCTPCT in the last 72 hours.  Coagulation profile  Recent Labs Lab 03/27/16 1435  INR 1.04    No results for input(s): DDIMER in the last 72 hours.  Cardiac Enzymes No results for input(s): CKMB, TROPONINI, MYOGLOBIN in the last 168 hours.  Invalid input(s): CK ------------------------------------------------------------------------------------------------------------------ No results found for: BNP  Micro Results No results found for this or any previous visit (from the past 240 hour(s)).  Radiology Reports Mr Brain Wo Contrast  Result Date: 03/27/2016 CLINICAL DATA:  65 y/o F; episode of fall in the bathroom, patient denies syncope or seizure-like activity, found to be hyponatremic. EXAM: MRI HEAD WITHOUT CONTRAST TECHNIQUE: Multiplanar, multiecho pulse sequences of the brain and surrounding structures were obtained without intravenous contrast. COMPARISON:  03/27/2016 CT of head. FINDINGS: Moderate motion artifact of all sequences. Brain: No diffusion restriction to suggest acute infarct. No abnormal susceptibility hypointensity to indicate intracranial hemorrhage. No significant T2 FLAIR signal abnormality. No focal mass effect. Mild parenchymal volume loss. Extra-axial space: No hydrocephalus. No extra-axial collection is identified. Proximal intracranial flow voids are maintained. Other: No abnormal signal of the paranasal sinuses. No abnormal signal of the mastoid air cells. Orbits are unremarkable. Calvarium is unremarkable. IMPRESSION: No acute intracranial abnormality. No evidence for osmotic demyelination. Unremarkable MRI head for patient's age. Electronically Signed   By: Kristine Garbe M.D.   On: 03/27/2016 19:54   Dg Chest Port 1 View  Result Date: 03/27/2016 CLINICAL DATA:  Disorientation and hyperventilation. EXAM: PORTABLE CHEST 1 VIEW COMPARISON:  01/16/2016 FINDINGS: Cardiomediastinal silhouette is normal. Mediastinal contours appear intact.  There is no evidence of pleural effusion or pneumothorax. Subtle airspace consolidation in the right upper lobe and left lower lobe. Osseous structures are without acute abnormality. Soft tissues are grossly normal. IMPRESSION: Subtle airspace consolidation in the right upper lobe and left lower lobe, which may represent atelectasis versus airspace disease. Electronically Signed   By: Fidela Salisbury M.D.   On: 03/27/2016 18:12   Ct Head Code Stroke W/o Cm  Result Date: 03/27/2016 CLINICAL DATA:  Code stroke.  Left facial droop.  Dysphagia EXAM: CT HEAD WITHOUT CONTRAST TECHNIQUE: Contiguous axial images were obtained from the base of the skull through the vertex without intravenous contrast. COMPARISON:  None. FINDINGS: Ventricle size normal. Cerebral volume normal. Negative for acute or chronic infarction. Negative for hemorrhage or mass. Negative calvarium. Slight mucosal thickening left maxillary sinus. Normal orbit. ASPECTS [facility] Stroke Program Early CT Score) - Ganglionic level infarction (caudate, lentiform nuclei, internal capsule, insula, M1-M3 cortex): 7 - Supraganglionic infarction (M4-M6 cortex): 3 Total score (0-10 with 10 being normal): 10 IMPRESSION: 1. No acute intracranial abnormality 2. ASPECTS is 10 These results were called by telephone at  the time of interpretation on 03/27/2016 at 3:01 pm to Dr. Tasia Catchings , who verbally acknowledged these results. Electronically Signed   By: Franchot Gallo M.D.   On: 03/27/2016 15:01    Time Spent in minutes  30   SINGH,PRASHANT K M.D on 03/28/2016 at 9:58 AM  Between 7am to 7pm - Pager - [phone]  After 7pm go to [email or web address] - password [facility]  [facility] -  Office  [phone]

## 2016-03-29 ENCOUNTER — Ambulatory Visit: Payer: Medicare Other | Admitting: Nurse Practitioner

## 2016-03-29 NOTE — Discharge Instructions (Signed)
Follow with Primary MD Nyiah-Margaret Hassell Done, FNP in 7 days   Get CBC, CMP, 2 view Chest X ray checked  by Primary MD or SNF MD in 5-7 days ( we routinely change or add medications that can affect your baseline labs and fluid status, therefore we recommend that you get the mentioned basic workup next visit with your PCP, your PCP may decide not to get them or add new tests based on their clinical decision)   Activity: As tolerated with Full fall precautions use walker/cane & assistance as needed   Disposition Home    Diet:   Heart Healthy  Check your Weight same time everyday, if you gain over 2 pounds, or you develop in leg swelling, experience more shortness of breath or chest pain, call your Primary MD immediately. Follow Cardiac Low Salt Diet and 1.5 lit/day fluid restriction.   On your next visit with your primary care physician please Get Medicines reviewed and adjusted.   Please request your Prim.MD to go over all Hospital Tests and Procedure/Radiological results at the follow up, please get all Hospital records sent to your Prim MD by signing hospital release before you go home.   If you experience worsening of your admission symptoms, develop shortness of breath, life threatening emergency, suicidal or homicidal thoughts you must seek medical attention immediately by calling 911 or calling your MD immediately  if symptoms less severe.  You Must read complete instructions/literature along with all the possible adverse reactions/side effects for all the Medicines you take and that have been prescribed to you. Take any new Medicines after you have completely understood and accpet all the possible adverse reactions/side effects.   Do not drive, operate heavy machinery, perform activities at heights, swimming or participation in water activities or provide baby sitting services if your were admitted for syncope or siezures until you have seen by Primary MD or a Neurologist and advised to  do so again.  Do not drive when taking Pain medications.    Do not take more than prescribed Pain, Sleep and Anxiety Medications  Special Instructions: If you have smoked or chewed Tobacco  in the last 2 yrs please stop smoking, stop any regular Alcohol  and or any Recreational drug use.  Wear Seat belts while driving.   Please note  You were cared for by a hospitalist during your hospital stay. If you have any questions about your discharge medications or the care you received while you were in the hospital after you are discharged, you can call the unit and asked to speak with the hospitalist on call if the hospitalist that took care of you is not available. Once you are discharged, your primary care physician will handle any further medical issues. Please note that NO REFILLS for any discharge medications will be authorized once you are discharged, as it is imperative that you return to your primary care physician (or establish a relationship with a primary care physician if you do not have one) for your aftercare needs so that they can reassess your need for medications and monitor your lab values.

## 2016-03-29 NOTE — Progress Notes (Signed)
Physical Therapy Treatment Patient Details Name: Renee Davidson MRN: [id number] DOB: [birth year] Today's Date: 03/29/2016    History of Present Illness 65 year old Female who presents as code stroke with altered mental status. She has a history of developmental delay with reported IQ of 102, anxiety, depression, hyperlipidemia. History is provided by EMS and family. States that she was in her usual state of health this morning. States that she complained of having large amount of diarrhea at around lunchtime today. Had a fall today while in the shower, and subsequently was noted by family to be weak, fatigued, and altered. She had slurring of her speech with questionable left-sided facial droop. Was not answering questions appropriately, she was not able to walk. CVA workup with inconclusive results.    PT Comments    Noting improving gait, balance, and activity tolerance; Use of cane did not give significant improvements in gait pattern or balance; OK for dc home from PT standpoint   Follow Up Recommendations  No PT follow up     Equipment Recommendations  None recommended by PT (pt has a cane she can use if needed)    Recommendations for Other Services       Precautions / Restrictions Precautions Precautions: Fall Precaution Comments: prior fall    Mobility  Bed Mobility Overal bed mobility: Independent             General bed mobility comments: supine to/from sit  Transfers Overall transfer level: Independent Equipment used: None             General transfer comment: No loss of balance at initial stand  Ambulation/Gait Ambulation/Gait assistance: Supervision Ambulation Distance (Feet): 300 Feet Assistive device: None;Straight cane Gait Pattern/deviations: Step-through pattern;Decreased step length - right;Decreased step length - left (Decr step height bilaterally)   Gait velocity interpretation: <1.8 ft/sec, indicative of risk for recurrent falls General Gait  Details: slowed gait pattern, guarded arm posture bil; noted somewhat freer arm swing with cues, but it would extinguish within 10 seconds; walked with and without single point cane; tending to hold cane up, cues to contact cane to floor; the cane did not provide significant improvements in gait steadiness    Stairs Stairs: Yes Stairs assistance: Min guard Stair Management: One rail Left;Forwards;Alternating pattern Number of Stairs: 12 General stair comments: Subjective report of weakness; Overall managing stairs well with natural alternating pattern  Wheelchair Mobility    Modified Rankin (Stroke Patients Only) Modified Rankin (Stroke Patients Only) Pre-Morbid Rankin Score: No symptoms Modified Rankin: No significant disability     Balance             Standing balance-Leahy Scale: Fair (approaching Good)                      Cognition Arousal/Alertness: Awake/alert Behavior During Therapy: WFL for tasks assessed/performed Overall Cognitive Status: History of cognitive impairments - at baseline                      Exercises      General Comments        Pertinent Vitals/Pain Pain Assessment: No/denies pain    Home Living                      Prior Function            PT Goals (current goals can now be found in the care plan section) Acute Rehab PT Goals Patient  Stated Goal: return home to see my dog PT Goal Formulation: With patient Time For Goal Achievement: 04/05/16 Potential to Achieve Goals: Good Progress towards PT goals: Progressing toward goals (Gait and transfer goals met)    Frequency  Min 3X/week    PT Plan Current plan remains appropriate    Co-evaluation             End of Session Equipment Utilized During Treatment: Gait belt Activity Tolerance: Patient tolerated treatment well;No increased pain Patient left: in chair;with call bell/phone within reach     Time: 0857-0915 PT Time Calculation (min)  (ACUTE ONLY): 18 min  Charges:  $Gait Training: 8-22 mins                    G Codes:      Quin Hoop 03/29/2016, 9:30 AM   Roney [place], [place] Pager [phone] Office [phone]

## 2016-03-29 NOTE — Discharge Summary (Signed)
Renee Davidson [id number] DOB: September 25, 1950 DOA: 03/27/2016  PCP: Chevis Pretty, FNP  Admit date: 03/27/2016  Discharge date: 03/29/2016  Admitted From: Home   Disposition:  Home   Recommendations for Outpatient Follow-up:   Follow up with PCP in 1-2 weeks  PCP Please obtain BMP/CBC, 2 view CXR in 1week,  (see Discharge instructions)   PCP Please follow up on the following pending results: None   Home Health: PT   Equipment/Devices: cane  Consultations: Case Management Discharge Condition: Stable   CODE STATUS: Full   Diet Recommendation: Heart Healthy   Chief Complaint  Patient presents with  . Code Stroke     Brief history of present illness from the day of admission and additional interim summary    MaryWittyis a 65 y.o.female,With history of depression, anxiety, OCD, osteoporosis, hypothyroidism, delay in mental development who is highly functional and lives at home and ambulates without any assistance comes into the hospital after an episode of fall in the bathroom where she [place] without hurting herself significantly, denies any syncopal episode of seizure-like activity. She was brought into the ER for evaluation where she was found to be hyponatremic, code stroke was called, head CT was unremarkable she was cleared by neurology and hospitalist team was requested to admit the patient for hyponatremiaand syncope workup.  Patient currently is back to her baseline appetite and she when she came she was quite Encephalopathic, she denies any headache,No fever or chills, no neck stiffness, no chest or abdominal pain, no palpitations or focal weakness. She does say that today she has had 3 loose bowel movements but otherwise negative review of systems.     Hospital issues addressed    1. Syncope  and fall. Due to severe dehydration with hypotension, improved with IV fluids, she had CT and MRI brain and both were nonacute. She was also seen by neurology, completely symptom-free, will get home PT and cane, will be discharged home, request PCP to check CBC BMP in 5-7 days.  2. Hyponatremia. Acute due to dehydration from diarrhea prior to admission, improved after IV fluids. PCP to monitor BMP.  3. Hypothyroidism. Stable TSH at 1.6 continue home dose Synthroid.  4. OCD, anxiety & depression. Stable continue home medications unchanged.  5. Dyslipidemia. Continue home dose statin.    Discharge diagnosis     Principal Problem:   Hyponatremia Active Problems:   Hypothyroidism   Hyperlipidemia with target LDL less than 100   GAD (generalized anxiety disorder)   GERD (gastroesophageal reflux disease)   OCD (obsessive compulsive disorder)   Encephalopathy    Discharge instructions    Discharge Instructions    Discharge instructions    Complete by:  As directed   Follow with Primary MD Talani-Margaret Hassell Done, FNP in 7 days   Get CBC, CMP, 2 view Chest X ray checked  by Primary MD or SNF MD in 5-7 days ( we routinely change or add medications that can affect your baseline labs and fluid status, therefore we recommend that you  get the mentioned basic workup next visit with your PCP, your PCP may decide not to get them or add new tests based on their clinical decision)   Activity: As tolerated with Full fall precautions use walker/cane & assistance as needed   Disposition Home    Diet:   Heart Healthy  Check your Weight same time everyday, if you gain over 2 pounds, or you develop in leg swelling, experience more shortness of breath or chest pain, call your Primary MD immediately. Follow Cardiac Low Salt Diet and 1.5 lit/day fluid restriction.   On your next visit with your primary care physician please Get Medicines reviewed and adjusted.   Please request your Prim.MD to  go over all Hospital Tests and Procedure/Radiological results at the follow up, please get all Hospital records sent to your Prim MD by signing hospital release before you go home.   If you experience worsening of your admission symptoms, develop shortness of breath, life threatening emergency, suicidal or homicidal thoughts you must seek medical attention immediately by calling 911 or calling your MD immediately  if symptoms less severe.  You Must read complete instructions/literature along with all the possible adverse reactions/side effects for all the Medicines you take and that have been prescribed to you. Take any new Medicines after you have completely understood and accpet all the possible adverse reactions/side effects.   Do not drive, operate heavy machinery, perform activities at heights, swimming or participation in water activities or provide baby sitting services if your were admitted for syncope or siezures until you have seen by Primary MD or a Neurologist and advised to do so again.  Do not drive when taking Pain medications.    Do not take more than prescribed Pain, Sleep and Anxiety Medications  Special Instructions: If you have smoked or chewed Tobacco  in the last 2 yrs please stop smoking, stop any regular Alcohol  and or any Recreational drug use.  Wear Seat belts while driving.   Please note  You were cared for by a hospitalist during your hospital stay. If you have any questions about your discharge medications or the care you received while you were in the hospital after you are discharged, you can call the unit and asked to speak with the hospitalist on call if the hospitalist that took care of you is not available. Once you are discharged, your primary care physician will handle any further medical issues. Please note that NO REFILLS for any discharge medications will be authorized once you are discharged, as it is imperative that you return to your primary care  physician (or establish a relationship with a primary care physician if you do not have one) for your aftercare needs so that they can reassess your need for medications and monitor your lab values.   Increase activity slowly    Complete by:  As directed      Discharge Medications     Medication List    TAKE these medications   alendronate 70 MG tablet Commonly known as:  FOSAMAX TAKE 1 TABLET EVERY 7 DAYS WITH A FULL GLASS OF WATER ON AN EMPTY STOMACH   aspirin EC 81 MG tablet Take 81 mg by mouth daily.   Calcium-Vitamin D-Vitamin K 500-100-40 MG-UNT-MCG Chew Chew 1 tablet by mouth 2 (two) times daily.   escitalopram 20 MG tablet Commonly known as:  LEXAPRO Take 1 tablet (20 mg total) by mouth daily.   guaiFENesin 600 MG 12 hr tablet Commonly known  as:  MUCINEX Take 600 mg by mouth 2 (two) times daily as needed for cough.   levothyroxine 75 MCG tablet Commonly known as:  SYNTHROID, LEVOTHROID TAKE (1) TABLET DAILY BE- FORE BREAKFAST.   LORazepam 0.5 MG tablet Commonly known as:  ATIVAN TAKE 1 TABLET THREE TIMES DAILY AS NEEDED FOR ANXIETY   lovastatin 40 MG tablet Commonly known as:  MEVACOR TAKE 1 TABLET ONCE DAILY AT BEDTIME   MIRALAX PO Take by mouth as needed.   omeprazole 20 MG capsule Commonly known as:  PRILOSEC TAKE (1) CAPSULE DAILY   risperiDONE 0.25 MG tablet Commonly known as:  RISPERDAL TAKE 1 TABLET IN THE MORNING AND 2 TABLETS AT BEDTIME       Follow-up Information    Kelissa-Margaret Hassell Done, FNP. Schedule an appointment as soon as possible for a visit in 1 week(s).   Specialty:  Family Medicine Contact information: [place] [address] [phone]           Major procedures and Radiology Reports - PLEASE review detailed and final reports thoroughly  -        Mr Brain Wo Contrast  Result Date: 03/27/2016 CLINICAL DATA:  64 y/o F; episode of fall in the bathroom, patient denies syncope or seizure-like  activity, found to be hyponatremic. EXAM: MRI HEAD WITHOUT CONTRAST TECHNIQUE: Multiplanar, multiecho pulse sequences of the brain and surrounding structures were obtained without intravenous contrast. COMPARISON:  03/27/2016 CT of head. FINDINGS: Moderate motion artifact of all sequences. Brain: No diffusion restriction to suggest acute infarct. No abnormal susceptibility hypointensity to indicate intracranial hemorrhage. No significant T2 FLAIR signal abnormality. No focal mass effect. Mild parenchymal volume loss. Extra-axial space: No hydrocephalus. No extra-axial collection is identified. Proximal intracranial flow voids are maintained. Other: No abnormal signal of the paranasal sinuses. No abnormal signal of the mastoid air cells. Orbits are unremarkable. Calvarium is unremarkable. IMPRESSION: No acute intracranial abnormality. No evidence for osmotic demyelination. Unremarkable MRI head for patient's age. Electronically Signed   By: Kristine Garbe M.D.   On: 03/27/2016 19:54   Dg Chest Port 1 View  Result Date: 03/27/2016 CLINICAL DATA:  Disorientation and hyperventilation. EXAM: PORTABLE CHEST 1 VIEW COMPARISON:  01/16/2016 FINDINGS: Cardiomediastinal silhouette is normal. Mediastinal contours appear intact. There is no evidence of pleural effusion or pneumothorax. Subtle airspace consolidation in the right upper lobe and left lower lobe. Osseous structures are without acute abnormality. Soft tissues are grossly normal. IMPRESSION: Subtle airspace consolidation in the right upper lobe and left lower lobe, which may represent atelectasis versus airspace disease. Electronically Signed   By: Fidela Salisbury M.D.   On: 03/27/2016 18:12   Ct Head Code Stroke W/o Cm  Result Date: 03/27/2016 CLINICAL DATA:  Code stroke.  Left facial droop.  Dysphagia EXAM: CT HEAD WITHOUT CONTRAST TECHNIQUE: Contiguous axial images were obtained from the base of the skull through the vertex without intravenous  contrast. COMPARISON:  None. FINDINGS: Ventricle size normal. Cerebral volume normal. Negative for acute or chronic infarction. Negative for hemorrhage or mass. Negative calvarium. Slight mucosal thickening left maxillary sinus. Normal orbit. ASPECTS [facility] Stroke Program Early CT Score) - Ganglionic level infarction (caudate, lentiform nuclei, internal capsule, insula, M1-M3 cortex): 7 - Supraganglionic infarction (M4-M6 cortex): 3 Total score (0-10 with 10 being normal): 10 IMPRESSION: 1. No acute intracranial abnormality 2. ASPECTS is 10 These results were called by telephone at the time of interpretation on 03/27/2016 at 3:01 pm to Dr. Tasia Catchings , who  verbally acknowledged these results. Electronically Signed   By: Franchot Gallo M.D.   On: 03/27/2016 15:01    Micro Results    Recent Results (from the past 240 hour(s))  Urine culture     Status: None   Collection Time: 03/27/16  3:15 PM  Result Value Ref Range Status   Specimen Description URINE, CATHETERIZED  Final   Special Requests NONE  Final   Culture NO GROWTH  Final   Report Status 03/28/2016 FINAL  Final    Today   Subjective    Javier Glazier today has no headache,no chest abdominal pain,no new weakness tingling or numbness, feels much better wants to go home today.    Objective   Blood pressure 105/45, pulse 65, temperature 97.9 F (36.6 C), temperature source Oral, resp. rate 16, height 5\' 4"  (1.626 m), weight 63.6 kg (140 lb 3.4 oz), SpO2 96 %.   Intake/Output Summary (Last 24 hours) at 03/29/16 1018 Last data filed at 03/29/16 0900  Gross per 24 hour  Intake              920 ml  Output              300 ml  Net              620 ml    Exam Awake Alert, Oriented x 3, No new F.N deficits, Normal affect [place].AT,PERRAL Supple Neck,No JVD, No cervical lymphadenopathy appriciated.  Symmetrical Chest wall movement, Good air movement bilaterally, CTAB RRR,No Gallops,Rubs or new Murmurs, No Parasternal Heave +ve B.Sounds,  Abd Soft, Non tender, No organomegaly appriciated, No rebound -guarding or rigidity. No Cyanosis, Clubbing or edema, No new Rash or bruise   Data Review   CBC w Diff: Lab Results  Component Value Date   WBC 5.6 03/28/2016   HGB 13.1 03/28/2016   HCT 39.1 03/28/2016   PLT 171 03/28/2016   LYMPHOPCT 8 03/27/2016   MONOPCT 5 03/27/2016   EOSPCT 0 03/27/2016   BASOPCT 0 03/27/2016    CMP: Lab Results  Component Value Date   NA 139 03/28/2016   NA 144 09/30/2015   K 3.8 03/28/2016   CL 113 (H) 03/28/2016   CO2 22 03/28/2016   BUN 5 (L) 03/28/2016   BUN 12 09/30/2015   CREATININE 0.83 03/28/2016   PROT 6.8 03/27/2016   PROT 6.7 09/30/2015   ALBUMIN 4.3 03/27/2016   ALBUMIN 4.3 09/30/2015   BILITOT 1.2 03/27/2016   BILITOT 0.3 09/30/2015   ALKPHOS 73 03/27/2016   AST 37 03/27/2016   ALT 16 03/27/2016  .   Total Time in preparing paper work, data evaluation and todays exam - 35 minutes  Thurnell Lose M.D on 03/29/2016 at 10:18 AM  [facility]   Office  [phone]

## 2016-03-29 NOTE — Care Management Note (Addendum)
Case Management Note  Patient Details  Name: Renee Davidson MRN: [id number] Date of Birth: [birth year]  Subjective/Objective:          CM following for progression and d/c planning.           Action/Plan: 03/29/2016 Met with pt and sister, pt for d/c with HHPT both pt and sister declined DME, stating that the pt would continue to use her cane. [facility] selected and notified by this CM of [place] need for eval .   Expected Discharge Date:      03/29/2016            Expected Discharge Plan:  [place]  In-House Referral:  NA  Discharge planning Services  CM Consult  Post Acute Care Choice:  Home Health Choice offered to:  Patient  DME Arranged:  N/A DME Agency:  NA  HH Arranged:  PT, Nurse's Aide [place] Agency:  [place]  Status of Service:  Completed, signed off  If discussed at [place] of Stay Meetings, dates discussed:    Additional Comments:  Adron Bene, RN 03/29/2016, 10:51 AM

## 2016-03-29 NOTE — Progress Notes (Signed)
Pt/family given discharge instructions, medication lists, follow up appointments, and when to call the doctor.  Pt/family verbalizes understanding. Kamoria Lucien McClintock, RN   

## 2016-03-30 DIAGNOSIS — R531 Weakness: Secondary | ICD-10-CM | POA: Diagnosis not present

## 2016-03-30 DIAGNOSIS — R296 Repeated falls: Secondary | ICD-10-CM | POA: Diagnosis not present

## 2016-04-01 DIAGNOSIS — R531 Weakness: Secondary | ICD-10-CM | POA: Diagnosis not present

## 2016-04-01 DIAGNOSIS — R296 Repeated falls: Secondary | ICD-10-CM | POA: Diagnosis not present

## 2016-04-02 ENCOUNTER — Ambulatory Visit (HOSPITAL_COMMUNITY)
Admission: RE | Admit: 2016-04-02 | Discharge: 2016-04-02 | Disposition: A | Discharge status: Home / Self Care | Payer: Medicare Other | Source: Ambulatory Visit | Attending: Gastroenterology | Admitting: Gastroenterology

## 2016-04-02 DIAGNOSIS — R1310 Dysphagia, unspecified: Secondary | ICD-10-CM | POA: Diagnosis not present

## 2016-04-02 DIAGNOSIS — K449 Diaphragmatic hernia without obstruction or gangrene: Secondary | ICD-10-CM | POA: Diagnosis not present

## 2016-04-02 DIAGNOSIS — K219 Gastro-esophageal reflux disease without esophagitis: Secondary | ICD-10-CM | POA: Insufficient documentation

## 2016-04-08 DIAGNOSIS — R296 Repeated falls: Secondary | ICD-10-CM | POA: Diagnosis not present

## 2016-04-08 DIAGNOSIS — R531 Weakness: Secondary | ICD-10-CM | POA: Diagnosis not present

## 2016-04-10 DIAGNOSIS — R296 Repeated falls: Secondary | ICD-10-CM | POA: Diagnosis not present

## 2016-04-10 DIAGNOSIS — R531 Weakness: Secondary | ICD-10-CM | POA: Diagnosis not present

## 2016-04-13 ENCOUNTER — Other Ambulatory Visit: Payer: Self-pay | Admitting: Nurse Practitioner

## 2016-04-13 NOTE — Telephone Encounter (Signed)
Last refill without being seen Please call in lorazepam with 0 refills

## 2016-04-14 DIAGNOSIS — R531 Weakness: Secondary | ICD-10-CM | POA: Diagnosis not present

## 2016-04-14 DIAGNOSIS — R296 Repeated falls: Secondary | ICD-10-CM | POA: Diagnosis not present

## 2016-04-15 NOTE — Telephone Encounter (Signed)
Rx called into pharmacy, pt aware and has appt scheduled with MMM 04/23/16.

## 2016-04-16 DIAGNOSIS — R296 Repeated falls: Secondary | ICD-10-CM | POA: Diagnosis not present

## 2016-04-16 DIAGNOSIS — R531 Weakness: Secondary | ICD-10-CM | POA: Diagnosis not present

## 2016-04-23 ENCOUNTER — Ambulatory Visit (INDEPENDENT_AMBULATORY_CARE_PROVIDER_SITE_OTHER): Payer: Medicare Other | Admitting: Nurse Practitioner

## 2016-04-23 ENCOUNTER — Encounter: Payer: Self-pay | Admitting: Nurse Practitioner

## 2016-04-23 VITALS — BP 102/63 | HR 69 | Temp 96.8°F | Ht 64.0 in | Wt 151.0 lb

## 2016-04-23 DIAGNOSIS — F429 Obsessive-compulsive disorder, unspecified: Secondary | ICD-10-CM | POA: Diagnosis not present

## 2016-04-23 DIAGNOSIS — E039 Hypothyroidism, unspecified: Secondary | ICD-10-CM

## 2016-04-23 DIAGNOSIS — K219 Gastro-esophageal reflux disease without esophagitis: Secondary | ICD-10-CM | POA: Diagnosis not present

## 2016-04-23 DIAGNOSIS — G9340 Encephalopathy, unspecified: Secondary | ICD-10-CM

## 2016-04-23 DIAGNOSIS — F411 Generalized anxiety disorder: Secondary | ICD-10-CM

## 2016-04-23 DIAGNOSIS — E785 Hyperlipidemia, unspecified: Secondary | ICD-10-CM

## 2016-04-23 DIAGNOSIS — Z8673 Personal history of transient ischemic attack (TIA), and cerebral infarction without residual deficits: Secondary | ICD-10-CM

## 2016-04-23 DIAGNOSIS — G4700 Insomnia, unspecified: Secondary | ICD-10-CM | POA: Diagnosis not present

## 2016-04-23 MED ORDER — LOVASTATIN 40 MG PO TABS: ORAL_TABLET | ORAL | 5 refills | Status: DC

## 2016-04-23 MED ORDER — OMEPRAZOLE 20 MG PO CPDR: DELAYED_RELEASE_CAPSULE | ORAL | 5 refills | Status: DC

## 2016-04-23 MED ORDER — ESCITALOPRAM OXALATE 20 MG PO TABS: 20.0000 mg | ORAL_TABLET | Freq: Every day | ORAL | 5 refills | Status: DC

## 2016-04-23 MED ORDER — RISPERIDONE 0.25 MG PO TABS: ORAL_TABLET | ORAL | 5 refills | Status: DC

## 2016-04-23 MED ORDER — LEVOTHYROXINE SODIUM 75 MCG PO TABS: ORAL_TABLET | ORAL | 5 refills | Status: DC

## 2016-04-23 MED ORDER — LORAZEPAM 0.5 MG PO TABS: 0.5000 mg | ORAL_TABLET | Freq: Three times a day (TID) | ORAL | 2 refills | Status: DC

## 2016-04-23 NOTE — Patient Instructions (Signed)
Stress and Stress Management Stress is a normal reaction to life events. It is what you feel when life demands more than you are used to or more than you can handle. Some stress can be useful. For example, the stress reaction can help you catch the last bus of the day, study for a test, or meet a deadline at work. But stress that occurs too often or for too long can cause problems. It can affect your emotional health and interfere with relationships and normal daily activities. Too much stress can weaken your immune system and increase your risk for physical illness. If you already have a medical problem, stress can make it worse. CAUSES  All sorts of life events may cause stress. An event that causes stress for one person may not be stressful for another person. Major life events commonly cause stress. These may be positive or negative. Examples include losing your job, moving into a new home, getting married, having a baby, or losing a loved one. Less obvious life events may also cause stress, especially if they occur day after day or in combination. Examples include working long hours, driving in traffic, caring for children, being in debt, or being in a difficult relationship. SIGNS AND SYMPTOMS Stress may cause emotional symptoms including, the following:  Anxiety. This is feeling worried, afraid, on edge, overwhelmed, or out of control.  Anger. This is feeling irritated or impatient.  Depression. This is feeling sad, down, helpless, or guilty.  Difficulty focusing, remembering, or making decisions. Stress may cause physical symptoms, including the following:   Aches and pains. These may affect your head, neck, back, stomach, or other areas of your body.  Tight muscles or clenched jaw.  Low energy or trouble sleeping. Stress may cause unhealthy behaviors, including the following:   Eating to feel better (overeating) or skipping meals.  Sleeping too little, too much, or both.  Working  too much or putting off tasks (procrastination).  Smoking, drinking alcohol, or using drugs to feel better. DIAGNOSIS  Stress is diagnosed through an assessment by your health care provider. Your health care provider will ask questions about your symptoms and any stressful life events.Your health care provider will also ask about your medical history and may order blood tests or other tests. Certain medical conditions and medicine can cause physical symptoms similar to stress. Mental illness can cause emotional symptoms and unhealthy behaviors similar to stress. Your health care provider may refer you to a mental health professional for further evaluation.  TREATMENT  Stress management is the recommended treatment for stress.The goals of stress management are reducing stressful life events and coping with stress in healthy ways.  Techniques for reducing stressful life events include the following:  Stress identification. Self-monitor for stress and identify what causes stress for you. These skills may help you to avoid some stressful events.  Time management. Set your priorities, keep a calendar of events, and learn to say "no." These tools can help you avoid making too many commitments. Techniques for coping with stress include the following:  Rethinking the problem. Try to think realistically about stressful events rather than ignoring them or overreacting. Try to find the positives in a stressful situation rather than focusing on the negatives.  Exercise. Physical exercise can release both physical and emotional tension. The key is to find a form of exercise you enjoy and do it regularly.  Relaxation techniques. These relax the body and mind. Examples include yoga, meditation, tai chi, biofeedback, deep  breathing, progressive muscle relaxation, listening to music, being out in nature, journaling, and other hobbies. Again, the key is to find one or more that you enjoy and can do  regularly.  Healthy lifestyle. Eat a balanced diet, get plenty of sleep, and do not smoke. Avoid using alcohol or drugs to relax.  Strong support network. Spend time with family, friends, or other people you enjoy being around.Express your feelings and talk things over with someone you trust. Counseling or talktherapy with a mental health professional may be helpful if you are having difficulty managing stress on your own. Medicine is typically not recommended for the treatment of stress.Talk to your health care provider if you think you need medicine for symptoms of stress. HOME CARE INSTRUCTIONS  Keep all follow-up visits as directed by your health care provider.  Take all medicines as directed by your health care provider. SEEK MEDICAL CARE IF:  Your symptoms get worse or you start having new symptoms.  You feel overwhelmed by your problems and can no longer manage them on your own. SEEK IMMEDIATE MEDICAL CARE IF:  You feel like hurting yourself or someone else.   This information is not intended to replace advice given to you by your health care provider. Make sure you discuss any questions you have with your health care provider.   Document Released: 01/12/2001 Document Revised: 08/09/2014 Document Reviewed: 03/13/2013 Elsevier Interactive Patient Education 2016 [employer]  

## 2016-04-23 NOTE — Progress Notes (Signed)
Subjective:    Patient ID: OLLIE DELANO, female    DOB: June 26, 1951, 65 y.o.   MRN: [id number]   Patient here today for follow up of chronic medical problems. There has been no changes since last visit. No complaints today  Outpatient Encounter Prescriptions as of 04/23/2016  Medication Sig  . alendronate (FOSAMAX) 70 MG tablet TAKE 1 TABLET EVERY 7 DAYS WITH A FULL GLASS OF WATER ON AN EMPTY STOMACH  . aspirin EC 81 MG tablet Take 81 mg by mouth daily.  . Calcium-Vitamin D-Vitamin K 500-100-40 MG-UNT-MCG CHEW Chew 1 tablet by mouth 2 (two) times daily.   Marland Kitchen escitalopram (LEXAPRO) 20 MG tablet Take 1 tablet (20 mg total) by mouth daily.  Marland Kitchen guaiFENesin (MUCINEX) 600 MG 12 hr tablet Take 600 mg by mouth 2 (two) times daily as needed for cough.  . levothyroxine (SYNTHROID, LEVOTHROID) 75 MCG tablet TAKE (1) TABLET DAILY BE- FORE BREAKFAST.  Marland Kitchen LORazepam (ATIVAN) 0.5 MG tablet TAKE 1 TABLET THREE TIMES DAILY AS NEEDED FOR ANXIETY  . lovastatin (MEVACOR) 40 MG tablet TAKE 1 TABLET ONCE DAILY AT BEDTIME  . omeprazole (PRILOSEC) 20 MG capsule TAKE (1) CAPSULE DAILY  . Polyethylene Glycol 3350 (MIRALAX PO) Take by mouth as needed.  . risperiDONE (RISPERDAL) 0.25 MG tablet TAKE 1 TABLET IN THE MORNING AND 2 TABLETS AT BEDTIME   No facility-administered encounter medications on file as of 04/23/2016.    * Had a choking episode and ha=d to go to the hospital back in August- saw GI and test were ran but nothing was done. SHe had another choking episode first of September and had to have endoscopy. Could not find any problems.  Hyperlipidemia  This is a chronic problem. The current episode started more than 1 year ago. Recent lipid tests were reviewed and are variable. Pertinent negatives include no shortness of breath. Current antihyperlipidemic treatment includes statins. The current treatment provides moderate improvement of lipids. Compliance problems include adherence to diet and adherence to exercise.   Risk factors for coronary artery disease include hypertension.  Thyroid Problem  Visit type: hypothyroidism. The symptoms have been stable. Her past medical history is significant for hyperlipidemia.  GERD  On prilosec daily which helps keep symptoms under control osteoporosis Fosamax - no side effects- does some walking daily- no recent c/o back pain or falls OCD/GAD On lexapro- helps her from worrying all the time about the samethings- no medication side effects. Is also on ativan daily. insomnia risperdal helps her rest better at night encephalopathy learning disability- slow to answer questions.   Review of Systems  Constitutional: Negative.   HENT: Negative.   Respiratory: Negative.  Negative for shortness of breath.   Cardiovascular: Negative.   Gastrointestinal: Negative.   Genitourinary: Negative.   Neurological: Negative.   Psychiatric/Behavioral: Negative.   All other systems reviewed and are negative.      Objective:   Physical Exam  Constitutional: She is oriented to person, place, and time. She appears well-developed and well-nourished.  HENT:  Nose: Nose normal.  Mouth/Throat: Oropharynx is clear and moist.  Eyes: EOM are normal.  Neck: Trachea normal, normal range of motion and full passive range of motion without pain. Neck supple. No JVD present. Carotid bruit is not present. No thyromegaly present.  Cardiovascular: Normal rate, regular rhythm, normal heart sounds and intact distal pulses.  Exam reveals no gallop and no friction rub.   No murmur heard. Pulmonary/Chest: Effort normal and breath sounds normal.  Abdominal: Soft. Bowel sounds are normal. She exhibits no distension and no mass. There is no tenderness.  Musculoskeletal: Normal range of motion.  Lymphadenopathy:    She has no cervical adenopathy.  Neurological: She is alert and oriented to person, place, and time. She has normal reflexes.  Skin: Skin is warm and dry.  Psychiatric: She has a  normal mood and affect. Her behavior is normal. Judgment and thought content normal.   BP 102/63   Pulse 69   Temp (!) 96.8 F (36 C) (Oral)   Ht 5' 4" (1.626 m)   Wt 151 lb (68.5 kg)   BMI 25.92 kg/m      Assessment & Plan:  1. Gastroesophageal reflux disease, esophagitis presence not specified Avoid spicy foods Do not eat 2 hours prior to bedtime - omeprazole (PRILOSEC) 20 MG capsule; TAKE (1) CAPSULE DAILY  Dispense: 30 capsule; Refill: 5  2. Hypothyroidism, unspecified hypothyroidism type - levothyroxine (SYNTHROID, LEVOTHROID) 75 MCG tablet; TAKE (1) TABLET DAILY BE- FORE BREAKFAST.  Dispense: 30 tablet; Refill: 5 - Thyroid Panel With TSH  3. GAD (generalized anxiety disorder) Stress ,management - LORazepam (ATIVAN) 0.5 MG tablet; Take 1 tablet (0.5 mg total) by mouth 3 (three) times daily.  Dispense: 90 tablet; Refill: 2  4. Hyperlipidemia with target LDL less than 100 Low fat dieet - lovastatin (MEVACOR) 40 MG tablet; TAKE 1 TABLET ONCE DAILY AT BEDTIME  Dispense: 30 tablet; Refill: 5 - CMP14+EGFR - Lipid panel  5. OCD (obsessive compulsive disorder) - escitalopram (LEXAPRO) 20 MG tablet; Take 1 tablet (20 mg total) by mouth daily.  Dispense: 30 tablet; Refill: 5  6. Encephalopathy  7. Insomnia Bedtime routine - risperiDONE (RISPERDAL) 0.25 MG tablet; TAKE 1 TABLET IN THE MORNING AND 2 TABLETS AT BEDTIME  Dispense: 90 tablet; Refill: 5    Labs pending Health maintenance reviewed Diet and exercise encouraged Continue all meds Follow up  In 6 months   [place], FNP

## 2016-04-24 LAB — THYROID PANEL WITH TSH
Free Thyroxine Index: 2.2 (ref 1.2–4.9)
T3 Uptake Ratio: 27 % (ref 24–39)
T4, Total: 8.2 ug/dL (ref 4.5–12.0)
TSH: 0.927 u[IU]/mL (ref 0.450–4.500)

## 2016-04-24 LAB — CMP14+EGFR
ALT: 13 IU/L (ref 0–32)
AST: 23 IU/L (ref 0–40)
Albumin/Globulin Ratio: 1.6 (ref 1.2–2.2)
Albumin: 4.2 g/dL (ref 3.6–4.8)
Alkaline Phosphatase: 79 IU/L (ref 39–117)
BUN/Creatinine Ratio: 12 (ref 12–28)
BUN: 9 mg/dL (ref 8–27)
Bilirubin Total: 0.4 mg/dL (ref 0.0–1.2)
CO2: 28 mmol/L (ref 18–29)
Calcium: 10.1 mg/dL (ref 8.7–10.3)
Chloride: 100 mmol/L (ref 96–106)
Creatinine, Ser: 0.76 mg/dL (ref 0.57–1.00)
GFR calc Af Amer: 94 mL/min/{1.73_m2} (ref >59)
GFR calc non Af Amer: 81 mL/min/{1.73_m2} (ref >59)
Globulin, Total: 2.7 g/dL (ref 1.5–4.5)
Glucose: 83 mg/dL (ref 65–99)
Potassium: 4.2 mmol/L (ref 3.5–5.2)
Sodium: 141 mmol/L (ref 134–144)
Total Protein: 6.9 g/dL (ref 6.0–8.5)

## 2016-04-24 LAB — LIPID PANEL
Chol/HDL Ratio: 2.9 ratio units (ref 0.0–4.4)
Cholesterol, Total: 151 mg/dL (ref 100–199)
HDL: 52 mg/dL (ref >39)
LDL Calculated: 85 mg/dL (ref 0–99)
Triglycerides: 72 mg/dL (ref 0–149)
VLDL Cholesterol Cal: 14 mg/dL (ref 5–40)

## 2016-04-28 ENCOUNTER — Telehealth: Payer: Self-pay | Admitting: Nurse Practitioner

## 2016-04-28 NOTE — Telephone Encounter (Signed)
Aware of lab results  

## 2016-05-01 ENCOUNTER — Ambulatory Visit (INDEPENDENT_AMBULATORY_CARE_PROVIDER_SITE_OTHER): Payer: Medicare Other | Admitting: Pediatrics

## 2016-05-01 DIAGNOSIS — Z7982 Long term (current) use of aspirin: Secondary | ICD-10-CM | POA: Diagnosis not present

## 2016-05-01 DIAGNOSIS — R296 Repeated falls: Secondary | ICD-10-CM | POA: Diagnosis not present

## 2016-05-01 DIAGNOSIS — F329 Major depressive disorder, single episode, unspecified: Secondary | ICD-10-CM | POA: Diagnosis not present

## 2016-05-01 DIAGNOSIS — Z9181 History of falling: Secondary | ICD-10-CM

## 2016-05-01 DIAGNOSIS — F79 Unspecified intellectual disabilities: Secondary | ICD-10-CM | POA: Diagnosis not present

## 2016-05-01 DIAGNOSIS — R531 Weakness: Secondary | ICD-10-CM

## 2016-05-01 DIAGNOSIS — F419 Anxiety disorder, unspecified: Secondary | ICD-10-CM

## 2016-05-14 ENCOUNTER — Ambulatory Visit (INDEPENDENT_AMBULATORY_CARE_PROVIDER_SITE_OTHER): Payer: Medicare Other

## 2016-05-14 DIAGNOSIS — Z23 Encounter for immunization: Secondary | ICD-10-CM | POA: Diagnosis not present

## 2016-06-30 ENCOUNTER — Ambulatory Visit (INDEPENDENT_AMBULATORY_CARE_PROVIDER_SITE_OTHER): Payer: Medicare Other

## 2016-06-30 ENCOUNTER — Encounter: Payer: Self-pay | Admitting: Pharmacist

## 2016-06-30 ENCOUNTER — Ambulatory Visit (INDEPENDENT_AMBULATORY_CARE_PROVIDER_SITE_OTHER): Payer: Medicare Other | Admitting: Pharmacist

## 2016-06-30 VITALS — BP 134/82 | HR 78 | Ht 64.0 in | Wt 145.0 lb

## 2016-06-30 DIAGNOSIS — Z0000 Encounter for general adult medical examination without abnormal findings: Secondary | ICD-10-CM | POA: Diagnosis not present

## 2016-06-30 DIAGNOSIS — M810 Age-related osteoporosis without current pathological fracture: Secondary | ICD-10-CM

## 2016-06-30 NOTE — Progress Notes (Signed)
Patient ID: Renee Davidson, female   DOB: 1950-12-17, 65 y.o.   MRN: [id number]     Subjective:   Renee Davidson is a 65 y.o. female who presents for a subsequent Medicare Annual Wellness Visit.  Renee Davidson is single. She lives in [place], [place] with her father.  He is present with her today.  She is disabled due to delayed mental development.  Renee Davidson has no health complaints today   Current Medications (verified) Outpatient Encounter Prescriptions as of 06/30/2016  Medication Sig  . alendronate (FOSAMAX) 70 MG tablet TAKE 1 TABLET EVERY 7 DAYS WITH A FULL GLASS OF WATER ON AN EMPTY STOMACH  . aspirin EC 81 MG tablet Take 81 mg by mouth daily.  Marland Kitchen escitalopram (LEXAPRO) 20 MG tablet Take 1 tablet (20 mg total) by mouth daily.  Marland Kitchen guaiFENesin (MUCINEX) 600 MG 12 hr tablet Take 600 mg by mouth 2 (two) times daily as needed for cough.  . levothyroxine (SYNTHROID, LEVOTHROID) 75 MCG tablet TAKE (1) TABLET DAILY BE- FORE BREAKFAST.  Marland Kitchen LORazepam (ATIVAN) 0.5 MG tablet Take 1 tablet (0.5 mg total) by mouth 3 (three) times daily.  Marland Kitchen lovastatin (MEVACOR) 40 MG tablet TAKE 1 TABLET ONCE DAILY AT BEDTIME  . omeprazole (PRILOSEC) 20 MG capsule TAKE (1) CAPSULE DAILY  . Polyethylene Glycol 3350 (MIRALAX PO) Take by mouth as needed.  . risperiDONE (RISPERDAL) 0.25 MG tablet TAKE 1 TABLET IN THE MORNING AND 2 TABLETS AT BEDTIME  . Calcium-Vitamin D-Vitamin K 500-100-40 MG-UNT-MCG CHEW Chew 1 tablet by mouth 2 (two) times daily.    No facility-administered encounter medications on file as of 06/30/2016.     Allergies (verified) Patient has no known allergies.   History: Past Medical History:  Diagnosis Date  . Anxiety   . Depression   . GERD (gastroesophageal reflux disease)   . Hyperlipidemia   . Mental developmental delay   . OCD (obsessive compulsive disorder)   . Osteopenia   . Osteoporosis   . Thyroid disease    Past Surgical History:  Procedure Laterality Date  . TONSILLECTOMY      Family History  Problem Relation Age of Onset  . Diabetes Father   . Hyperlipidemia Father   . Hypertension Father   . Atrial fibrillation Father   . Hyperlipidemia Sister   . Dysphagia Sister   . Heart disease Maternal Grandfather   . Colon cancer Maternal Grandfather    Social History   Occupational History  . retired    Social History Main Topics  . Smoking status: Never Smoker  . Smokeless tobacco: Never Used  . Alcohol use No  . Drug use: No  . Sexual activity: No    Do you feel safe at home?  Yes Are there smokers in your home (other than you)? No  Dietary issues and exercise activities: Current Exercise Habits: Home exercise routine, Type of exercise: Other - see comments (stationary bike), Time (Minutes): 50  Current Dietary habits:  Not following any specific diet but her father usually cooks and he tries to eat low CHO and does not add salt to foods.   Objective:    Today's Vitals   06/30/16 1218  BP: 134/82  Pulse: 78  Weight: 145 lb (65.8 kg)  Height: 5\' 4"  (1.626 m)  PainSc: 0-No pain   Body mass index is 24.89 kg/m.   DEXA results:   Date AP Spine T-Score Neck of Right Hip Neck of Left Hip Total R Hip  06/30/2016 -2.5 -2.4 -2.3 -2.5  12/05/2013 -2.9 -2.8 -2.3 -2.7  01/13/2011 -2.3 -2.2 -2.2 -2.3   Activities of Daily Living In your present state of health, do you have any difficulty performing the following activities: 06/30/2016 03/27/2016  Hearing? N N  Vision? N N  Difficulty concentrating or making decisions? Renee Davidson  Walking or climbing stairs? N Y  Dressing or bathing? N Y  Doing errands, shopping? N Y  Conservation officer, nature and eating ? N -  Using the Toilet? N -  In the past six months, have you accidently leaked urine? N -  Do you have problems with loss of bowel control? N -  Managing your Medications? N -  Managing your Finances? N -  Housekeeping or managing your Housekeeping? N -  Some recent data might be hidden         Depression Screen PHQ 2/9 Scores 06/30/2016 04/23/2016 09/30/2015 07/01/2015  PHQ - 2 Score 2 0 0 0  PHQ- 9 Score 4 - - -     Fall Risk Fall Risk  06/30/2016 04/23/2016 09/30/2015 07/01/2015 05/19/2015  Falls in the past year? No No No No No    Cognitive Function: MMSE - Mini Mental State Exam 06/30/2016 05/19/2015  Orientation to time 4 2  Orientation to Place 4 1  Registration 3 3  Attention/ Calculation 4 4  Recall 2 0  Language- name 2 objects 2 2  Language- repeat 1 1  Language- follow 3 step command 3 3  Language- read & follow direction 1 1  Write a sentence 1 1  Copy design 0 0  Total score 25 18    Immunizations and Health Maintenance Immunization History  Administered Date(s) Administered  . Influenza, High Dose Seasonal PF 05/14/2016  . Influenza,inj,Quad PF,36+ Mos 05/18/2013, 05/07/2014, 05/06/2015  . Pneumococcal Conjugate-13 01/17/2014  . Pneumococcal Polysaccharide-23 07/01/2015  . Zoster 03/04/2014   There are no preventive care reminders to display for this patient.  Patient Care Team: Chevis Pretty, FNP as PCP - General (Nurse Practitioner) Danie Binder, MD as Consulting Physician (Gastroenterology)  Indicate any recent Medical Services you may have received from other than [facility] providers in the past year (date may be approximate).    Assessment:    Annual Wellness Visit  Osteoporosis with increase BMD with current bisphosphonate treatment.    Screening Tests Health Maintenance  Topic Date Due  . COLONOSCOPY  10/21/2016 (Originally 12/02/1998)  . COLON CANCER SCREENING ANNUAL FOBT  09/02/2016  . MAMMOGRAM  08/04/2017  . TETANUS/TDAP  05/02/2021  . INFLUENZA VACCINE  Completed  . DEXA SCAN  Completed  . ZOSTAVAX  Completed  . Hepatitis C Screening  Completed  . PNA vac Low Risk Adult  Completed        Plan:   During the course of the visit Renee Davidson was educated and counseled about the following appropriate screening and  preventive services:   Vaccines to include Pneumoccal, Influenza, Td, Zostavax - Vaccines are currently UTD  Colorectal cancer screening - patient continues to decline colonoscopy  Cardiovascular disease screening - last EKG 2017; lipids at goal; BP at goal  Diabetes screening - last BG was 83  Bone Denisty / Osteoporosis Screening - DEXA done today. Stable - recommend continue alendronate 70mg  weekly.  Continue calcium (Rx sent to pharmacy so they can add to pill packaging if able) and vitamin D supplementation  Mammogram - appt made today  PAP - declined  Glaucoma screening / Eye exam -  UTD  Nutrition counseling - continue to limit high fat foods and avoid adding sodium to foods.  Advanced Directives - UTD, copy requested  Physical Activity - continue to ride stationary bike daily    Patient Instructions (the written plan) were given to the patient.   Cherre Robins, PharmD   06/30/2016

## 2016-06-30 NOTE — Patient Instructions (Addendum)
  Renee Davidson , Thank you for taking time to come for your Medicare Wellness Visit. I appreciate your ongoing commitment to your health goals. Please review the following plan we discussed and let me know if I can assist you in the future.   These are the goals we discussed:  Look for copy of [place] (important to know where these are kept - you can also bring copy to our office to be placed in our file / electronic chart)  Continue to exercise daily on stationary bike.    This is a list of the screening recommended for you and due dates:  Health Maintenance  Topic Date Due  . Colon Cancer Screening  10/21/2016*  . Stool Blood Test  09/02/2016  . Mammogram  08/04/2017  . Tetanus Vaccine  05/02/2021  . Flu Shot  Completed  . DEXA scan (bone density measurement)  Completed  . Shingles Vaccine  Completed  .  Hepatitis C: One time screening is recommended by Center for Disease Control  (CDC) for  adults born from [birth year] through 1967.   Completed  . Pneumonia vaccines  Completed  *Topic was postponed. The date shown is not the original due date.

## 2016-08-14 ENCOUNTER — Other Ambulatory Visit: Payer: Self-pay | Admitting: Nurse Practitioner

## 2016-08-14 DIAGNOSIS — F411 Generalized anxiety disorder: Secondary | ICD-10-CM

## 2016-08-16 ENCOUNTER — Other Ambulatory Visit: Payer: Self-pay | Admitting: *Deleted

## 2016-08-16 DIAGNOSIS — M810 Age-related osteoporosis without current pathological fracture: Secondary | ICD-10-CM

## 2016-08-16 DIAGNOSIS — F411 Generalized anxiety disorder: Secondary | ICD-10-CM

## 2016-08-16 MED ORDER — LORAZEPAM 0.5 MG PO TABS
ORAL_TABLET | ORAL | 1 refills | Status: DC
Start: 2016-08-16 — End: 2016-10-12

## 2016-08-16 NOTE — Telephone Encounter (Signed)
Please call in lorazepam with 1 refills 

## 2016-08-16 NOTE — Telephone Encounter (Signed)
Lorazepam script called to pharmacy. 

## 2016-08-16 NOTE — Telephone Encounter (Signed)
Last filled 1218/17, last seen 04/23/16. Route to pool for call in

## 2016-08-21 ENCOUNTER — Other Ambulatory Visit: Payer: Self-pay | Admitting: Nurse Practitioner

## 2016-08-21 DIAGNOSIS — M810 Age-related osteoporosis without current pathological fracture: Secondary | ICD-10-CM

## 2016-10-12 ENCOUNTER — Other Ambulatory Visit: Payer: Self-pay | Admitting: Nurse Practitioner

## 2016-10-12 DIAGNOSIS — F411 Generalized anxiety disorder: Secondary | ICD-10-CM

## 2016-10-12 NOTE — Telephone Encounter (Signed)
Last filled 09/13/16, last seen 04/23/16. Route to pool for call in

## 2016-10-13 NOTE — Telephone Encounter (Signed)
Please call in ativan with 1 refills 

## 2016-10-27 ENCOUNTER — Encounter: Payer: Self-pay | Admitting: *Deleted

## 2016-12-06 ENCOUNTER — Ambulatory Visit (INDEPENDENT_AMBULATORY_CARE_PROVIDER_SITE_OTHER): Payer: Medicare Other | Admitting: Nurse Practitioner

## 2016-12-06 ENCOUNTER — Other Ambulatory Visit: Payer: Self-pay | Admitting: Nurse Practitioner

## 2016-12-06 ENCOUNTER — Encounter: Payer: Self-pay | Admitting: Nurse Practitioner

## 2016-12-06 VITALS — BP 123/71 | HR 69 | Temp 96.8°F | Ht 64.0 in | Wt 161.0 lb

## 2016-12-06 DIAGNOSIS — M810 Age-related osteoporosis without current pathological fracture: Secondary | ICD-10-CM

## 2016-12-06 DIAGNOSIS — Z1212 Encounter for screening for malignant neoplasm of rectum: Secondary | ICD-10-CM

## 2016-12-06 DIAGNOSIS — F5101 Primary insomnia: Secondary | ICD-10-CM

## 2016-12-06 DIAGNOSIS — K219 Gastro-esophageal reflux disease without esophagitis: Secondary | ICD-10-CM | POA: Diagnosis not present

## 2016-12-06 DIAGNOSIS — F429 Obsessive-compulsive disorder, unspecified: Secondary | ICD-10-CM | POA: Diagnosis not present

## 2016-12-06 DIAGNOSIS — F411 Generalized anxiety disorder: Secondary | ICD-10-CM

## 2016-12-06 DIAGNOSIS — E039 Hypothyroidism, unspecified: Secondary | ICD-10-CM | POA: Diagnosis not present

## 2016-12-06 DIAGNOSIS — G9340 Encephalopathy, unspecified: Secondary | ICD-10-CM

## 2016-12-06 DIAGNOSIS — Z1211 Encounter for screening for malignant neoplasm of colon: Secondary | ICD-10-CM

## 2016-12-06 DIAGNOSIS — E785 Hyperlipidemia, unspecified: Secondary | ICD-10-CM | POA: Diagnosis not present

## 2016-12-06 MED ORDER — OMEPRAZOLE 20 MG PO CPDR: DELAYED_RELEASE_CAPSULE | ORAL | 5 refills | Status: DC

## 2016-12-06 MED ORDER — LOVASTATIN 40 MG PO TABS: ORAL_TABLET | ORAL | 5 refills | Status: DC

## 2016-12-06 MED ORDER — RISPERIDONE 0.25 MG PO TABS: ORAL_TABLET | ORAL | 5 refills | Status: DC

## 2016-12-06 MED ORDER — LEVOTHYROXINE SODIUM 75 MCG PO TABS: ORAL_TABLET | ORAL | 5 refills | Status: DC

## 2016-12-06 MED ORDER — ESCITALOPRAM OXALATE 20 MG PO TABS: 20.0000 mg | ORAL_TABLET | Freq: Every day | ORAL | 5 refills | Status: DC

## 2016-12-06 NOTE — Patient Instructions (Signed)
Fall Prevention in the Home Falls can cause injuries. They can happen to people of all ages. There are many things you can do to make your home safe and to help prevent falls. What can I do on the outside of my home?  Regularly fix the edges of walkways and driveways and fix any cracks.  Remove anything that might make you trip as you walk through a door, such as a raised step or threshold.  Trim any bushes or trees on the path to your home.  Use bright outdoor lighting.  Clear any walking paths of anything that might make someone trip, such as rocks or tools.  Regularly check to see if handrails are loose or broken. Make sure that both sides of any steps have handrails.  Any raised decks and porches should have guardrails on the edges.  Have any leaves, snow, or ice cleared regularly.  Use sand or salt on walking paths during winter.  Clean up any spills in your garage right away. This includes oil or grease spills. What can I do in the bathroom?  Use night lights.  Install grab bars by the toilet and in the tub and shower. Do not use towel bars as grab bars.  Use non-skid mats or decals in the tub or shower.  If you need to sit down in the shower, use a plastic, non-slip stool.  Keep the floor dry. Clean up any water that spills on the floor as soon as it happens.  Remove soap buildup in the tub or shower regularly.  Attach bath mats securely with double-sided non-slip rug tape.  Do not have throw rugs and other things on the floor that can make you trip. What can I do in the bedroom?  Use night lights.  Make sure that you have a light by your bed that is easy to reach.  Do not use any sheets or blankets that are too big for your bed. They should not hang down onto the floor.  Have a firm chair that has side arms. You can use this for support while you get dressed.  Do not have throw rugs and other things on the floor that can make you trip. What can I do in the  kitchen?  Clean up any spills right away.  Avoid walking on wet floors.  Keep items that you use a lot in easy-to-reach places.  If you need to reach something above you, use a strong step stool that has a grab bar.  Keep electrical cords out of the way.  Do not use floor polish or wax that makes floors slippery. If you must use wax, use non-skid floor wax.  Do not have throw rugs and other things on the floor that can make you trip. What can I do with my stairs?  Do not leave any items on the stairs.  Make sure that there are handrails on both sides of the stairs and use them. Fix handrails that are broken or loose. Make sure that handrails are as long as the stairways.  Check any carpeting to make sure that it is firmly attached to the stairs. Fix any carpet that is loose or worn.  Avoid having throw rugs at the top or bottom of the stairs. If you do have throw rugs, attach them to the floor with carpet tape.  Make sure that you have a light switch at the top of the stairs and the bottom of the stairs. If you do   not have them, ask someone to add them for you. What else can I do to help prevent falls?  Wear shoes that:  Do not have high heels.  Have rubber bottoms.  Are comfortable and fit you well.  Are closed at the toe. Do not wear sandals.  If you use a stepladder:  Make sure that it is fully opened. Do not climb a closed stepladder.  Make sure that both sides of the stepladder are locked into place.  Ask someone to hold it for you, if possible.  Clearly mark and make sure that you can see:  Any grab bars or handrails.  First and last steps.  Where the edge of each step is.  Use tools that help you move around (mobility aids) if they are needed. These include:  Canes.  Walkers.  Scooters.  Crutches.  Turn on the lights when you go into a dark area. Replace any light bulbs as soon as they burn out.  Set up your furniture so you have a clear path.  Avoid moving your furniture around.  If any of your floors are uneven, fix them.  If there are any pets around you, be aware of where they are.  Review your medicines with your doctor. Some medicines can make you feel dizzy. This can increase your chance of falling. Ask your doctor what other things that you can do to help prevent falls. This information is not intended to replace advice given to you by your health care provider. Make sure you discuss any questions you have with your health care provider. Document Released: 05/15/2009 Document Revised: 12/25/2015 Document Reviewed: 08/23/2014 Elsevier Interactive Patient Education  2017 Elsevier Inc.  

## 2016-12-06 NOTE — Progress Notes (Signed)
Subjective:    Patient ID: Renee Davidson, female    DOB: [birth year], 66 y.o.   MRN: [id number]  HPI  Renee Davidson is here today for follow up of chronic medical problem.  Outpatient Encounter Prescriptions as of 12/06/2016  Medication Sig  . alendronate (FOSAMAX) 70 MG tablet TAKE 1 TABLET EVERY 7 DAYS WITH A FULL GLASS OF WATER ON AN EMPTY STOMACH  . aspirin EC 81 MG tablet Take 81 mg by mouth daily.  . Calcium-Vitamin D-Vitamin K 500-100-40 MG-UNT-MCG CHEW Chew 1 tablet by mouth 2 (two) times daily.   Marland Kitchen escitalopram (LEXAPRO) 20 MG tablet Take 1 tablet (20 mg total) by mouth daily.  Marland Kitchen guaiFENesin (MUCINEX) 600 MG 12 hr tablet Take 600 mg by mouth 2 (two) times daily as needed for cough.  . levothyroxine (SYNTHROID, LEVOTHROID) 75 MCG tablet TAKE (1) TABLET DAILY BE- FORE BREAKFAST.  Marland Kitchen LORazepam (ATIVAN) 0.5 MG tablet TAKE  (1)  TABLET  THREE TIMES DAILY.  Marland Kitchen lovastatin (MEVACOR) 40 MG tablet TAKE 1 TABLET ONCE DAILY AT BEDTIME  . omeprazole (PRILOSEC) 20 MG capsule TAKE (1) CAPSULE DAILY  . Polyethylene Glycol 3350 (MIRALAX PO) Take by mouth as needed.  . risperiDONE (RISPERDAL) 0.25 MG tablet TAKE 1 TABLET IN THE MORNING AND 2 TABLETS AT BEDTIME   No facility-administered encounter medications on file as of 12/06/2016.     1. Gastroesophageal reflux disease, esophagitis presence not specified  Omeprazole works well to keep symptoms under control  2. Hypothyroidism, unspecified type  No problems that family is aware of  3. GAD (generalized anxiety disorder)  On ativan which helps kep hr calm  4. Hyperlipidemia with target LDL less than 100  Patient just eats whatever family gives her- not capable of fixing own meals  5. Obsessive-compulsive disorder, unspecified type  Has had her entire life- more actions then hording  6. Encephalopathy  Has had since birth- is encapable of taking care of herself  7. Osteoporosis, post-menopausal  No c/o back pain    New complaints: Wants forms  filled out for assistive living facility     Review of Systems  Constitutional: Negative for diaphoresis.  Eyes: Negative for pain.  Respiratory: Negative for shortness of breath.   Cardiovascular: Negative for chest pain, palpitations and leg swelling.  Gastrointestinal: Negative for abdominal pain.  Endocrine: Negative for polydipsia.  Skin: Negative for rash.  Neurological: Negative for dizziness, weakness and headaches.  Hematological: Does not bruise/bleed easily.  All other systems reviewed and are negative.      Objective:   Physical Exam  Constitutional: She is oriented to person, place, and time. She appears well-developed and well-nourished.  HENT:  Nose: Nose normal.  Mouth/Throat: Oropharynx is clear and moist.  Eyes: EOM are normal.  Neck: Trachea normal, normal range of motion and full passive range of motion without pain. Neck supple. No JVD present. Carotid bruit is not present. No thyromegaly present.  Cardiovascular: Normal rate, regular rhythm, normal heart sounds and intact distal pulses.  Exam reveals no gallop and no friction rub.   No murmur heard. Pulmonary/Chest: Effort normal and breath sounds normal.  Abdominal: Soft. Bowel sounds are normal. She exhibits no distension and no mass. There is no tenderness.  Musculoskeletal: Normal range of motion.  Lymphadenopathy:    She has no cervical adenopathy.  Neurological: She is alert and oriented to person, place, and time. She has normal reflexes.  Skin: Skin is warm and dry.  Psychiatric:  She has a normal mood and affect. Her behavior is normal. Judgment and thought content normal.   BP 123/71   Pulse 69   Temp (!) 96.8 F (36 C) (Oral)   Ht _0  (1.626 m)   Wt 161 lb (73 kg)   BMI 27.64 kg/m      Assessment & Plan:  1. Gastroesophageal reflux disease, esophagitis presence not specified Avoid spicy foods Do not eat 2 hours prior to bedtime - omeprazole (PRILOSEC) 20 MG capsule; TAKE (1)  CAPSULE DAILY  Dispense: 30 capsule; Refill: 5  2. Hypothyroidism, unspecified type - levothyroxine (SYNTHROID, LEVOTHROID) 75 MCG tablet; TAKE (1) TABLET DAILY BE- FORE BREAKFAST.  Dispense: 30 tablet; Refill: 5 - Thyroid Panel With TSH  3. GAD (generalized anxiety disorder) stress management  4. Hyperlipidemia with target LDL less than 100 Low fat diet - lovastatin (MEVACOR) 40 MG tablet; TAKE 1 TABLET ONCE DAILY AT BEDTIME  Dispense: 30 tablet; Refill: 5 - CMP14+EGFR - Lipid panel  5. Obsessive-compulsive disorder, unspecified type - escitalopram (LEXAPRO) 20 MG tablet; Take 1 tablet (20 mg total) by mouth daily.  Dispense: 30 tablet; Refill: 5  6. Encephalopathy         7. Osteoporosis, post-menopausal Weight bearing exericses  8. Primary insomnia Bedtime routine - risperiDONE (RISPERDAL) 0.25 MG tablet; TAKE 1 TABLET IN THE MORNING AND 2 TABLETS AT BEDTIME  Dispense: 90 tablet; Refill: 5   hemoccult cards given to patient with directions Labs pending Health maintenance reviewed Diet and exercise encouraged Continue all meds Follow up  In 6 months   [place], FNP

## 2016-12-07 LAB — LIPID PANEL
Chol/HDL Ratio: 2.9 ratio (ref 0.0–4.4)
Cholesterol, Total: 164 mg/dL (ref 100–199)
HDL: 57 mg/dL (ref >39)
LDL Calculated: 82 mg/dL (ref 0–99)
Triglycerides: 125 mg/dL (ref 0–149)
VLDL Cholesterol Cal: 25 mg/dL (ref 5–40)

## 2016-12-07 LAB — CMP14+EGFR
ALT: 22 IU/L (ref 0–32)
AST: 26 IU/L (ref 0–40)
Albumin/Globulin Ratio: 1.5 (ref 1.2–2.2)
Albumin: 3.9 g/dL (ref 3.6–4.8)
Alkaline Phosphatase: 95 IU/L (ref 39–117)
BUN/Creatinine Ratio: 20 (ref 12–28)
BUN: 17 mg/dL (ref 8–27)
Bilirubin Total: 0.2 mg/dL (ref 0.0–1.2)
CO2: 21 mmol/L (ref 18–29)
Calcium: 9.2 mg/dL (ref 8.7–10.3)
Chloride: 104 mmol/L (ref 96–106)
Creatinine, Ser: 0.86 mg/dL (ref 0.57–1.00)
GFR calc Af Amer: 80 mL/min/{1.73_m2} (ref >59)
GFR calc non Af Amer: 70 mL/min/{1.73_m2} (ref >59)
Globulin, Total: 2.6 g/dL (ref 1.5–4.5)
Glucose: 88 mg/dL (ref 65–99)
Potassium: 3.9 mmol/L (ref 3.5–5.2)
Sodium: 142 mmol/L (ref 134–144)
Total Protein: 6.5 g/dL (ref 6.0–8.5)

## 2016-12-07 LAB — THYROID PANEL WITH TSH
Free Thyroxine Index: 2 (ref 1.2–4.9)
T3 Uptake Ratio: 25 % (ref 24–39)
T4, Total: 7.8 ug/dL (ref 4.5–12.0)
TSH: 1.42 u[IU]/mL (ref 0.450–4.500)

## 2016-12-14 ENCOUNTER — Other Ambulatory Visit: Payer: Self-pay | Admitting: Nurse Practitioner

## 2016-12-14 DIAGNOSIS — F411 Generalized anxiety disorder: Secondary | ICD-10-CM

## 2016-12-14 NOTE — Telephone Encounter (Signed)
Please call in lorazepam with 1 refills 

## 2016-12-15 NOTE — Telephone Encounter (Signed)
Lorazepam script called to pharmacy.

## 2016-12-16 ENCOUNTER — Other Ambulatory Visit: Payer: Self-pay | Admitting: Nurse Practitioner

## 2016-12-16 DIAGNOSIS — M810 Age-related osteoporosis without current pathological fracture: Secondary | ICD-10-CM

## 2017-02-15 ENCOUNTER — Other Ambulatory Visit: Payer: Self-pay | Admitting: Nurse Practitioner

## 2017-02-15 DIAGNOSIS — F411 Generalized anxiety disorder: Secondary | ICD-10-CM

## 2017-02-16 NOTE — Telephone Encounter (Signed)
Last seen 12/06/16  MMM  If approved route to nurse to call into [facility] 

## 2017-02-18 NOTE — Telephone Encounter (Signed)
Rx called into the pharmacy. 

## 2017-02-18 NOTE — Telephone Encounter (Signed)
Please call in ativan with 1 refills 

## 2017-03-14 NOTE — Progress Notes (Signed)
Patient aware.

## 2017-04-20 ENCOUNTER — Other Ambulatory Visit: Payer: Self-pay | Admitting: Nurse Practitioner

## 2017-04-20 DIAGNOSIS — F411 Generalized anxiety disorder: Secondary | ICD-10-CM

## 2017-04-20 NOTE — Telephone Encounter (Signed)
Last seen 12/06/16  MMM  If approved route to nurse to call into [facility]

## 2017-04-21 NOTE — Telephone Encounter (Signed)
Please call in lorazepam with 0 refills 

## 2017-04-22 ENCOUNTER — Encounter: Payer: Self-pay | Admitting: *Deleted

## 2017-05-19 ENCOUNTER — Other Ambulatory Visit: Payer: Self-pay | Admitting: Nurse Practitioner

## 2017-05-19 DIAGNOSIS — F411 Generalized anxiety disorder: Secondary | ICD-10-CM

## 2017-05-23 ENCOUNTER — Other Ambulatory Visit: Payer: Self-pay | Admitting: Nurse Practitioner

## 2017-05-23 DIAGNOSIS — E039 Hypothyroidism, unspecified: Secondary | ICD-10-CM

## 2017-05-23 DIAGNOSIS — K219 Gastro-esophageal reflux disease without esophagitis: Secondary | ICD-10-CM

## 2017-05-23 DIAGNOSIS — E785 Hyperlipidemia, unspecified: Secondary | ICD-10-CM

## 2017-05-23 DIAGNOSIS — F5101 Primary insomnia: Secondary | ICD-10-CM

## 2017-05-23 DIAGNOSIS — M810 Age-related osteoporosis without current pathological fracture: Secondary | ICD-10-CM

## 2017-05-23 NOTE — Telephone Encounter (Signed)
rx called into pharmacy and pt scheduled with [facility] 06/06/17 for medication refills.

## 2017-05-23 NOTE — Telephone Encounter (Signed)
Please call in lorazepam with 0 refills Last refill without being seen

## 2017-05-24 NOTE — Progress Notes (Signed)
Subjective: CC:"ears stopped up" PCP: Chevis Pretty, FNP DXI:Renee Davidson is a 66 y.o. female, who is accompanied to today's visit by her sister Renee Davidson.  She is presenting to clinic today for:  1. Ear fullness Patient reports a 2-3-week history of right-sided ear fullness, decreased hearing, and intermittent draining.  She reports that she initially had some pain and pressure but that has resolved.  She denies balance issues, dizziness, headache, visual disturbance, fevers, chills, nausea, vomiting.  She does report some drainage but no blood.  She does not use Q-tips.  She has not used any over-the-counter remedies.  No recent swimming or submerging of head in water.  No Known Allergies Past Medical History:  Diagnosis Date  . Anxiety   . Depression   . GERD (gastroesophageal reflux disease)   . Hyperlipidemia   . Mental developmental delay   . OCD (obsessive compulsive disorder)   . Osteoporosis   . Thyroid disease    Family History  Problem Relation Age of Onset  . Diabetes Father   . Hyperlipidemia Father   . Hypertension Father   . Atrial fibrillation Father   . Hyperlipidemia Sister   . Dysphagia Sister   . Heart disease Maternal Grandfather   . Colon cancer Maternal Grandfather     Current Outpatient Prescriptions:  .  alendronate (FOSAMAX) 70 MG tablet, TAKE 1 TABLET EVERY 7 DAYS WITH A FULL GLASS OF WATER ON AN EMPTY STOMACH, Disp: 12 tablet, Rfl: 0 .  aspirin EC 81 MG tablet, Take 81 mg by mouth daily., Disp: , Rfl:  .  Calcium-Vitamin D-Vitamin K 500-100-40 MG-UNT-MCG CHEW, Chew 1 tablet by mouth 2 (two) times daily. , Disp: , Rfl:  .  escitalopram (LEXAPRO) 20 MG tablet, Take 1 tablet (20 mg total) by mouth daily., Disp: 30 tablet, Rfl: 5 .  guaiFENesin (MUCINEX) 600 MG 12 hr tablet, Take 600 mg by mouth 2 (two) times daily as needed for cough., Disp: , Rfl:  .  levothyroxine (SYNTHROID, LEVOTHROID) 75 MCG tablet, TAKE (1) TABLET DAILY BE- FORE BREAKFAST.,  Disp: 90 tablet, Rfl: 0 .  LORazepam (ATIVAN) 0.5 MG tablet, TAKE  (1)  TABLET  THREE TIMES DAILY., Disp: 90 tablet, Rfl: 0 .  lovastatin (MEVACOR) 40 MG tablet, TAKE 1 TABLET ONCE DAILY AT BEDTIME, Disp: 90 tablet, Rfl: 0 .  omeprazole (PRILOSEC) 20 MG capsule, TAKE (1) CAPSULE DAILY, Disp: 90 capsule, Rfl: 0 .  Polyethylene Glycol 3350 (MIRALAX PO), Take by mouth as needed., Disp: , Rfl:  .  risperiDONE (RISPERDAL) 0.25 MG tablet, TAKE 1 TABLET IN THE MORNING AND 2 TABLETS AT BEDTIME, Disp: 270 tablet, Rfl: 0  Social Hx: non smoker.  Health Maintenance: Flu shot  ROS: Per HPI  Objective: Office vital signs reviewed. BP 125/71   Pulse 85   Temp (!) 97.1 F (36.2 C) (Oral)   Ht 5\' 4"  (1.626 m)   Wt 179 lb (81.2 kg)   BMI 30.73 kg/m   Physical Examination:  General: Awake, alert, well nourished, No acute distress HEENT: Normal    Neck: No masses palpated. No lymphadenopathy    Ears: L Tympanic membranes intact, normal light reflex, no erythema, no bulging; R TM obscured by cerumen.    Eyes: extraocular membranes intact, sclera white; wears glasses    Nose: nasal turbinates moist, no nasal discharge    Throat: moist mucus membranes, no erythema, poor dentition. Airway is patent  Assessment/ Plan: 66 y.o. female   1. Impacted  cerumen of right ear Irrigation was performed today.  Cerumen successfully removed.  Patient reported improvement in symptoms after removal.  TM well visualized.  No evidence of perforation or infection.  Home care instructions were provided.  AVS handout reviewed.  Follow-up as needed.   No orders of the defined types were placed in this encounter.  No orders of the defined types were placed in this encounter.    Janora Norlander, DO [place] [phone]

## 2017-05-25 ENCOUNTER — Encounter: Payer: Self-pay | Admitting: Family Medicine

## 2017-05-25 ENCOUNTER — Ambulatory Visit (INDEPENDENT_AMBULATORY_CARE_PROVIDER_SITE_OTHER): Payer: Medicare Other | Admitting: Family Medicine

## 2017-05-25 VITALS — BP 125/71 | HR 85 | Temp 97.1°F | Ht 64.0 in | Wt 179.0 lb

## 2017-05-25 DIAGNOSIS — H6121 Impacted cerumen, right ear: Secondary | ICD-10-CM

## 2017-05-25 DIAGNOSIS — Z23 Encounter for immunization: Secondary | ICD-10-CM | POA: Diagnosis not present

## 2017-05-25 NOTE — Patient Instructions (Addendum)
Consider ear candling for ear wax buildup that does not clear up with Debrox (ear drop to help loosen ear wax).     Earwax Buildup, Adult The ears produce a substance called earwax that helps keep bacteria out of the ear and protects the skin in the ear canal. Occasionally, earwax can build up in the ear and cause discomfort or hearing loss. What increases the risk? This condition is more likely to develop in people who:  Are female.  Are elderly.  Naturally produce more earwax.  Clean their ears often with cotton swabs.  Use earplugs often.  Use in-ear headphones often.  Wear hearing aids.  Have narrow ear canals.  Have earwax that is overly thick or sticky.  Have eczema.  Are dehydrated.  Have excess hair in the ear canal.  What are the signs or symptoms? Symptoms of this condition include:  Reduced or muffled hearing.  A feeling of fullness in the ear or feeling that the ear is plugged.  Fluid coming from the ear.  Ear pain.  Ear itch.  Ringing in the ear.  Coughing.  An obvious piece of earwax that can be seen inside the ear canal.  How is this diagnosed? This condition may be diagnosed based on:  Your symptoms.  Your medical history.  An ear exam. During the exam, your health care provider will look into your ear with an instrument called an otoscope.  You may have tests, including a hearing test. How is this treated? This condition may be treated by:  Using ear drops to soften the earwax.  Having the earwax removed by a health care provider. The health care provider may: ? Flush the ear with water. ? Use an instrument that has a loop on the end (curette). ? Use a suction device.  Surgery to remove the wax buildup. This may be done in severe cases.  Follow these instructions at home:  Take over-the-counter and prescription medicines only as told by your health care provider.  Do not put any objects, including cotton swabs, into your  ear. You can clean the opening of your ear canal with a washcloth or facial tissue.  Follow instructions from your health care provider about cleaning your ears. Do not over-clean your ears.  Drink enough fluid to keep your urine clear or pale yellow. This will help to thin the earwax.  Keep all follow-up visits as told by your health care provider. If earwax builds up in your ears often or if you use hearing aids, consider seeing your health care provider for routine, preventive ear cleanings. Ask your health care provider how often you should schedule your cleanings.  If you have hearing aids, clean them according to instructions from the manufacturer and your health care provider. Contact a health care provider if:  You have ear pain.  You develop a fever.  You have blood, pus, or other fluid coming from your ear.  You have hearing loss.  You have ringing in your ears that does not go away.  Your symptoms do not improve with treatment.  You feel like the room is spinning (vertigo). Summary  Earwax can build up in the ear and cause discomfort or hearing loss.  The most common symptoms of this condition include reduced or muffled hearing and a feeling of fullness in the ear or feeling that the ear is plugged.  This condition may be diagnosed based on your symptoms, your medical history, and an ear exam.  This  condition may be treated by using ear drops to soften the earwax or by having the earwax removed by a health care provider.  Do not put any objects, including cotton swabs, into your ear. You can clean the opening of your ear canal with a washcloth or facial tissue. This information is not intended to replace advice given to you by your health care provider. Make sure you discuss any questions you have with your health care provider. Document Released: 08/26/2004 Document Revised: 09/29/2016 Document Reviewed: 09/29/2016 Elsevier Interactive Patient Education  [employer].

## 2017-06-02 ENCOUNTER — Other Ambulatory Visit: Payer: Self-pay | Admitting: Nurse Practitioner

## 2017-06-02 DIAGNOSIS — F429 Obsessive-compulsive disorder, unspecified: Secondary | ICD-10-CM

## 2017-06-06 ENCOUNTER — Encounter: Payer: Self-pay | Admitting: Nurse Practitioner

## 2017-06-06 ENCOUNTER — Ambulatory Visit (INDEPENDENT_AMBULATORY_CARE_PROVIDER_SITE_OTHER): Payer: Medicare Other | Admitting: Nurse Practitioner

## 2017-06-06 VITALS — BP 110/66 | HR 66 | Temp 96.8°F | Ht 64.0 in | Wt 177.0 lb

## 2017-06-06 DIAGNOSIS — F411 Generalized anxiety disorder: Secondary | ICD-10-CM

## 2017-06-06 DIAGNOSIS — F5101 Primary insomnia: Secondary | ICD-10-CM | POA: Diagnosis not present

## 2017-06-06 DIAGNOSIS — M810 Age-related osteoporosis without current pathological fracture: Secondary | ICD-10-CM | POA: Diagnosis not present

## 2017-06-06 DIAGNOSIS — F429 Obsessive-compulsive disorder, unspecified: Secondary | ICD-10-CM

## 2017-06-06 DIAGNOSIS — E039 Hypothyroidism, unspecified: Secondary | ICD-10-CM

## 2017-06-06 DIAGNOSIS — R739 Hyperglycemia, unspecified: Secondary | ICD-10-CM

## 2017-06-06 DIAGNOSIS — K219 Gastro-esophageal reflux disease without esophagitis: Secondary | ICD-10-CM | POA: Diagnosis not present

## 2017-06-06 DIAGNOSIS — E785 Hyperlipidemia, unspecified: Secondary | ICD-10-CM | POA: Diagnosis not present

## 2017-06-06 MED ORDER — RISPERIDONE 0.25 MG PO TABS: ORAL_TABLET | ORAL | 1 refills | Status: DC

## 2017-06-06 MED ORDER — ALENDRONATE SODIUM 70 MG PO TABS: ORAL_TABLET | ORAL | 3 refills | Status: DC

## 2017-06-06 MED ORDER — LORAZEPAM 0.5 MG PO TABS: ORAL_TABLET | ORAL | 2 refills | Status: DC

## 2017-06-06 MED ORDER — ESCITALOPRAM OXALATE 20 MG PO TABS: 20.0000 mg | ORAL_TABLET | Freq: Every day | ORAL | 1 refills | Status: DC

## 2017-06-06 MED ORDER — LEVOTHYROXINE SODIUM 75 MCG PO TABS: ORAL_TABLET | ORAL | 1 refills | Status: DC

## 2017-06-06 MED ORDER — OMEPRAZOLE 20 MG PO CPDR: DELAYED_RELEASE_CAPSULE | ORAL | 1 refills | Status: DC

## 2017-06-06 NOTE — Progress Notes (Signed)
Subjective:    Patient ID: Renee Davidson, female    DOB: 1950-09-12, 66 y.o.   MRN: [id number]  HPI Renee Davidson is here today for follow up of chronic medical problem. Brought in by her sister.  Outpatient Encounter Medications as of 06/06/2017  Medication Sig  . alendronate (FOSAMAX) 70 MG tablet TAKE 1 TABLET EVERY 7 DAYS WITH A FULL GLASS OF WATER ON AN EMPTY STOMACH  . aspirin EC 81 MG tablet Take 81 mg by mouth daily.  . Calcium-Vitamin D-Vitamin K 500-100-40 MG-UNT-MCG CHEW Chew 1 tablet by mouth 2 (two) times daily.   Marland Kitchen escitalopram (LEXAPRO) 20 MG tablet TAKE 1 TABLET DAILY  . guaiFENesin (MUCINEX) 600 MG 12 hr tablet Take 600 mg by mouth 2 (two) times daily as needed for cough.  . levothyroxine (SYNTHROID, LEVOTHROID) 75 MCG tablet TAKE (1) TABLET DAILY BE- FORE BREAKFAST.  Marland Kitchen LORazepam (ATIVAN) 0.5 MG tablet TAKE  (1)  TABLET  THREE TIMES DAILY.  Marland Kitchen lovastatin (MEVACOR) 40 MG tablet TAKE 1 TABLET ONCE DAILY AT BEDTIME  . omeprazole (PRILOSEC) 20 MG capsule TAKE (1) CAPSULE DAILY  . Polyethylene Glycol 3350 (MIRALAX PO) Take by mouth as needed.  . risperiDONE (RISPERDAL) 0.25 MG tablet TAKE 1 TABLET IN THE MORNING AND 2 TABLETS AT BEDTIME   No facility-administered encounter medications on file as of 06/06/2017.     1. Gastroesophageal reflux disease, esophagitis presence not specified  Patient is on omeprazole daily- has bad reflux if does not take  2. Hypothyroidism, unspecified type  No problems that she is aware of  3. Osteoporosis, post-menopausal  No c/o back pain  4. Obsessive-compulsive disorder, unspecified type  Takes lexapro which she says help, but there are stilll some things that she is OCD about. She cannot stand for things to not be where they are suppose to be.  5. Hyperlipidemia with target LDL less than 100  \does not watch diet at all  6. GAD (generalized anxiety disorder)  Takes ativen 2x a day- is not her OCD will drive her crazy  7.      insomnia      Patient takes resperal nightly- helps her to rest well. No c/o fatigue  New complaints: None  today  Social history: Patient had encephalopathy as a child and has some developmental delays- [place] and her sister checks on heroften  Review of Systems  Constitutional: Negative for activity change and appetite change.  HENT: Negative.   Eyes: Negative for pain.  Respiratory: Negative for shortness of breath.   Cardiovascular: Negative for chest pain, palpitations and leg swelling.  Gastrointestinal: Negative for abdominal pain.  Endocrine: Negative for polydipsia.  Genitourinary: Negative.   Skin: Negative for rash.  Neurological: Negative for dizziness, weakness and headaches.  Hematological: Does not bruise/bleed easily.  Psychiatric/Behavioral: Negative.   All other systems reviewed and are negative.      Objective:   Physical Exam  Constitutional: She is oriented to person, place, and time. She appears well-developed and well-nourished.  HENT:  Nose: Nose normal.  Mouth/Throat: Oropharynx is clear and moist.  Eyes: EOM are normal.  Neck: Trachea normal, normal range of motion and full passive range of motion without pain. Neck supple. No JVD present. Carotid bruit is not present. No thyromegaly present.  Cardiovascular: Normal rate, regular rhythm, normal heart sounds and intact distal pulses. Exam reveals no gallop and no friction rub.  No murmur heard. Pulmonary/Chest:  Effort normal and breath sounds normal.  Abdominal: Soft. Bowel sounds are normal. She exhibits no distension and no mass. There is no tenderness.  Musculoskeletal: Normal range of motion.  Lymphadenopathy:    She has no cervical adenopathy.  Neurological: She is alert and oriented to person, place, and time. She has normal reflexes.  Skin: Skin is warm and dry.  Psychiatric: She has a normal mood and affect. Her behavior is normal. Judgment and thought content  normal.   BP 110/66   Pulse 66   Temp (!) 96.8 F (36 C) (Oral)   Ht '5\' 4"'  (1.626 m)   Wt 177 lb (80.3 kg)   BMI 30.38 kg/m        Assessment & Plan:  1. Gastroesophageal reflux disease, esophagitis presence not specified Avoid spicy foods Do not eat 2 hours prior to bedtime - omeprazole (PRILOSEC) 20 MG capsule; TAKE (1) CAPSULE DAILY  Dispense: 90 capsule; Refill: 1  2. Hypothyroidism, unspecified type - Thyroid Panel With TSH - levothyroxine (SYNTHROID, LEVOTHROID) 75 MCG tablet; TAKE (1) TABLET DAILY BE- FORE BREAKFAST.  Dispense: 90 tablet; Refill: 1  3. Osteoporosis, post-menopausal Weight bearing exercises - alendronate (FOSAMAX) 70 MG tablet; TAKE 1 TABLET EVERY 7 DAYS WITH A FULL GLASS OF WATER ON AN EMPTY STOMACH  Dispense: 12 tablet; Refill: 3  4. Obsessive-compulsive disorder, unspecified type - escitalopram (LEXAPRO) 20 MG tablet; Take 1 tablet (20 mg total) daily by mouth.  Dispense: 90 tablet; Refill: 1  5. Hyperlipidemia with target LDL less than 100 - CMP14+EGFR - Lipid panel  6. GAD (generalized anxiety disorder) Stress management - LORazepam (ATIVAN) 0.5 MG tablet; TAKE  (1)  TABLET  THREE TIMES DAILY.  Dispense: 90 tablet; Refill: 2  7. Primary insomnia Bedtime routine - risperiDONE (RISPERDAL) 0.25 MG tablet; TAKE 1 TABLET IN THE MORNING AND 2 TABLETS AT BEDTIME  Dispense: 270 tablet; Refill: 1    Labs pending Health maintenance reviewed Diet and exercise encouraged Continue all meds Follow up  In 6 months   [place], FNP

## 2017-06-06 NOTE — Patient Instructions (Signed)

## 2017-06-07 ENCOUNTER — Other Ambulatory Visit: Payer: Self-pay | Admitting: Nurse Practitioner

## 2017-06-07 DIAGNOSIS — R739 Hyperglycemia, unspecified: Secondary | ICD-10-CM

## 2017-06-07 LAB — CMP14+EGFR
ALT: 14 IU/L (ref 0–32)
AST: 23 IU/L (ref 0–40)
Albumin/Globulin Ratio: 1.8 (ref 1.2–2.2)
Albumin: 4.2 g/dL (ref 3.6–4.8)
Alkaline Phosphatase: 98 IU/L (ref 39–117)
BUN/Creatinine Ratio: 15 (ref 12–28)
BUN: 13 mg/dL (ref 8–27)
Bilirubin Total: 0.3 mg/dL (ref 0.0–1.2)
CO2: 18 mmol/L — ABNORMAL LOW (ref 20–29)
Calcium: 8.9 mg/dL (ref 8.7–10.3)
Chloride: 105 mmol/L (ref 96–106)
Creatinine, Ser: 0.85 mg/dL (ref 0.57–1.00)
GFR calc Af Amer: 81 mL/min/{1.73_m2} (ref >59)
GFR calc non Af Amer: 71 mL/min/{1.73_m2} (ref >59)
Globulin, Total: 2.4 g/dL (ref 1.5–4.5)
Glucose: 151 mg/dL — ABNORMAL HIGH (ref 65–99)
Potassium: 4.1 mmol/L (ref 3.5–5.2)
Sodium: 142 mmol/L (ref 134–144)
Total Protein: 6.6 g/dL (ref 6.0–8.5)

## 2017-06-07 LAB — LIPID PANEL
Chol/HDL Ratio: 2.8 ratio (ref 0.0–4.4)
Cholesterol, Total: 157 mg/dL (ref 100–199)
HDL: 56 mg/dL (ref >39)
LDL Calculated: 89 mg/dL (ref 0–99)
Triglycerides: 58 mg/dL (ref 0–149)
VLDL Cholesterol Cal: 12 mg/dL (ref 5–40)

## 2017-06-07 LAB — THYROID PANEL WITH TSH
Free Thyroxine Index: 2.3 (ref 1.2–4.9)
T3 Uptake Ratio: 27 % (ref 24–39)
T4, Total: 8.6 ug/dL (ref 4.5–12.0)
TSH: 0.727 u[IU]/mL (ref 0.450–4.500)

## 2017-06-08 DIAGNOSIS — R739 Hyperglycemia, unspecified: Secondary | ICD-10-CM | POA: Diagnosis not present

## 2017-06-08 LAB — BAYER DCA HB A1C WAIVED: HB A1C (BAYER DCA - WAIVED): 5.7 % (ref <7.0)

## 2017-06-08 NOTE — Addendum Note (Signed)
Addended by: Earlene Plater on: 06/08/2017 03:52 PM   Modules accepted: Orders

## 2017-07-04 ENCOUNTER — Ambulatory Visit (INDEPENDENT_AMBULATORY_CARE_PROVIDER_SITE_OTHER): Payer: Medicare Other | Admitting: *Deleted

## 2017-07-04 ENCOUNTER — Encounter: Payer: Self-pay | Admitting: *Deleted

## 2017-07-04 VITALS — BP 116/76 | HR 71 | Ht 64.0 in | Wt 181.0 lb

## 2017-07-04 DIAGNOSIS — Z0000 Encounter for general adult medical examination without abnormal findings: Secondary | ICD-10-CM

## 2017-07-04 DIAGNOSIS — Z23 Encounter for immunization: Secondary | ICD-10-CM

## 2017-07-04 NOTE — Progress Notes (Signed)
Subjective:   Renee Davidson is a 66 y.o. female who presents for Medicare Annual (Subsequent) preventive examination.  Renee Davidson comes in for visit today accompanied by her sister Renee Davidson.  Patient recently moved to [place] home in [place], [place] in June.  She lives in an apartment alone there with her cat.  She lived with her father until he passed away in 2016/11/12. She is disabled due to delayed mental development.  She is close with her 2 sisters and they provide transportation for her to all appointments.  Renee Davidson states she enjoys her new home.  She states she has many neighbors to socialize with, group activities to participate in, and they provide healthy meals. She attends church regularly with one of her sisters.  Renee Davidson states she feels her health is better than it was last year, and she attributes that to her new living arrangements and the exercise class she has been doing there.  She reports no hospitalizations, emergency room visits or surgeries in the past year.  Review of Systems:   All negative today       Objective:     Vitals: BP 116/76   Pulse 71   Ht 5\' 4"  (1.626 m)   Wt 181 lb (82.1 kg)   BMI 31.07 kg/m   Body mass index is 31.07 kg/m.  Advanced Directives 07/04/2017 06/30/2016 03/27/2016 01/16/2016 11/23/2015 05/19/2015 03/04/2014  Does Patient Have a Medical Advance Directive? No Yes No No No No Patient has advance directive, copy not in chart  Type of Advance Directive - [place];Living will - - - - Press photographer;Living will  Does patient want to make changes to medical advance directive? - No - Patient declined - - - - -  Copy of [place] in Chart? - No - copy requested - - - - Copy requested from family  Would patient like information on creating a medical advance directive? Yes (ED - Information included in AVS) - No - patient declined information No - patient declined information No -  patient declined information No - patient declined information -    Tobacco Social History   Tobacco Use  Smoking Status Never Smoker  Smokeless Tobacco Never Used     Counseling given: No                                  Past Medical History:  Diagnosis Date  . Anxiety   . Depression   . GERD (gastroesophageal reflux disease)   . Hyperlipidemia   . Mental developmental delay   . OCD (obsessive compulsive disorder)   . Osteoporosis   . Thyroid disease    Past Surgical History:  Procedure Laterality Date  . TONSILLECTOMY     Family History  Problem Relation Age of Onset  . Diabetes Father   . Hyperlipidemia Father   . Hypertension Father   . Atrial fibrillation Father   . Hyperlipidemia Sister   . Dysphagia Sister   . Heart disease Maternal Grandfather   . Colon cancer Maternal Grandfather    Social History   Socioeconomic History  . Marital status: Single    Spouse name: None  . Number of children: 0  . Years of education: None  . Highest education level: None  Social Needs  . Financial resource strain: Not hard at all  . Food insecurity -  worry: Never true  . Food insecurity - inability: Never true  . Transportation needs - medical: No  . Transportation needs - non-medical: No  Occupational History  . Occupation: retired  Tobacco Use  . Smoking status: Never Smoker  . Smokeless tobacco: Never Used  Substance and Sexual Activity  . Alcohol use: No  . Drug use: No  . Sexual activity: No  Other Topics Concern  . None  Social History Narrative  . None    Outpatient Encounter Medications as of 07/04/2017  Medication Sig  . alendronate (FOSAMAX) 70 MG tablet TAKE 1 TABLET EVERY 7 DAYS WITH A FULL GLASS OF WATER ON AN EMPTY STOMACH  . aspirin EC 81 MG tablet Take 81 mg by mouth daily.  . Calcium-Vitamin D-Vitamin K 500-100-40 MG-UNT-MCG CHEW Chew 1 tablet by mouth 2 (two) times daily.   Marland Kitchen escitalopram (LEXAPRO) 20 MG tablet Take 1  tablet (20 mg total) daily by mouth.  Marland Kitchen guaiFENesin (MUCINEX) 600 MG 12 hr tablet Take 600 mg by mouth 2 (two) times daily as needed for cough.  . levothyroxine (SYNTHROID, LEVOTHROID) 75 MCG tablet TAKE (1) TABLET DAILY BE- FORE BREAKFAST.  Marland Kitchen LORazepam (ATIVAN) 0.5 MG tablet TAKE  (1)  TABLET  THREE TIMES DAILY.  Marland Kitchen lovastatin (MEVACOR) 40 MG tablet TAKE 1 TABLET ONCE DAILY AT BEDTIME  . omeprazole (PRILOSEC) 20 MG capsule TAKE (1) CAPSULE DAILY  . Polyethylene Glycol 3350 (MIRALAX PO) Take by mouth as needed.  . risperiDONE (RISPERDAL) 0.25 MG tablet TAKE 1 TABLET IN THE MORNING AND 2 TABLETS AT BEDTIME   No facility-administered encounter medications on file as of 07/04/2017.     Activities of Daily Living In your present state of health, do you have any difficulty performing the following activities: 07/04/2017  Hearing? Y  Comment slight decrease in hearing- patient not interested in seeing audiologist  Vision? N  Difficulty concentrating or making decisions? N  Walking or climbing stairs? N  Dressing or bathing? N  Doing errands, shopping? Y  Comment sister brings her to appointments or to run errands.  Patient does not drive  Some recent data might be hidden      Patient Care Team: Chevis Pretty, FNP as PCP - General (Nurse Practitioner) Danie Binder, MD as Consulting Physician (Gastroenterology)    Assessment:     Exercise Activities and Dietary recommendations    Goals    . Exercise 3x per week (15 min per time)     Increase exercise to walking on days you do not have chair exercises at your apartment complex to 15-30 minutes per session.      Patient states she has meals provided for her at her apartment.  Encouraged a diet consisting mostly of vegetables, fruits, lean meats, and whole grains.     Fall Risk Fall Risk  07/04/2017 06/06/2017 05/25/2017 12/06/2016 06/30/2016  Falls in the past year? Yes No No No No  Number falls in past yr: 1 - - - -    Injury with Fall? No - - - -  Follow up Falls prevention discussed;Education provided - - - -   Is the patient's home free of loose throw rugs in walkways, pet beds, electrical cords, etc?   yes      Grab bars in the bathroom? no      Handrails on the stairs?   yes      Adequate lighting?   yes  Depression Screen PHQ 2/9 Scores 07/04/2017 06/06/2017  05/25/2017 12/06/2016  PHQ - 2 Score 0 0 0 0  PHQ- 9 Score - - - -     Cognitive Function MMSE - Mini Mental State Exam 07/04/2017 06/30/2016 05/19/2015  Orientation to time 2 4 2   Orientation to Place 2 4 1   Registration 3 3 3   Attention/ Calculation 3 4 4   Recall 1 2 0  Language- name 2 objects 2 2 2   Language- repeat 1 1 1   Language- follow 3 step command 3 3 3   Language- read & follow direction 1 1 1   Write a sentence 1 1 1   Copy design 1 0 0  Total score 20 25 18         Immunization History  Administered Date(s) Administered  . Influenza, High Dose Seasonal PF 05/14/2016, 05/06/2017  . Influenza,inj,Quad PF,6+ Mos 05/18/2013, 05/07/2014, 05/06/2015  . Pneumococcal Conjugate-13 01/17/2014  . Pneumococcal Polysaccharide-23 07/01/2015  . Zoster 03/04/2014  . Zoster Recombinat (Shingrix) 07/04/2017   Screening Tests Health Maintenance  Topic Date Due  . COLON CANCER SCREENING ANNUAL FOBT  09/02/2016  . MAMMOGRAM  08/04/2017  . DEXA SCAN  06/30/2018  . TETANUS/TDAP  05/02/2021  . INFLUENZA VACCINE  Completed  . Hepatitis C Screening  Completed  . PNA vac Low Risk Adult  Completed   Cancer Screenings: Lung:  Low Dose CT Chest recommended if Age 73-80 years, 30 pack-year currently smoking OR have quit w/in 15years. Patient does not qualify. Breast:Up to date on Mammogram? Yes  Up to date of Bone Density/Dexa? Yes Colorectal: FOBT given       Plan:     Increase walking the hallways at your apartment 2-3 days per week for about 15 minutes.   Continue eating a healthy diet of mostly vegetables, fruits, lean proteins,  whole grains, and sweets in moderation.  Return for 2nd Shingrix vaccine after 09/04/17. Complete your stool sample and return to our office.  I have personally reviewed and noted the following in the patient's chart:   . Medical and social history . Use of alcohol, tobacco or illicit drugs  . Current medications and supplements . Functional ability and status . Nutritional status . Physical activity . Advanced directives . List of other physicians . Hospitalizations, surgeries, and ER visits in previous 12 months . Vitals . Screenings to include cognitive, depression, and falls . Referrals and appointments  In addition, I have reviewed and discussed with patient certain preventive protocols, quality metrics, and best practice recommendations. A written personalized care plan for preventive services as well as general preventive health recommendations were provided to patient.     Deryl Giroux M, RN  07/04/2017  I have reviewed and agree with the above AWV documentation.   Assunta Found, MD [place]

## 2017-07-04 NOTE — Patient Instructions (Signed)
Try to work on walking the hallways at your apartment 2-3 days per week for about 15 minutes.    Continue eating a healthy diet of mostly vegetables, fruits, lean proteins, whole grains, and sweets in moderation.   Please return for your 2nd Shingrix vaccine after 09/04/17.  Please complete your stool sample and return to our office.  Thank you for coming in for your Annual Wellness Visit today!!     Fall Prevention in the Home Falls can cause injuries. They can happen to people of all ages. There are many things you can do to make your home safe and to help prevent falls. What can I do on the outside of my home?  Regularly fix the edges of walkways and driveways and fix any cracks.  Remove anything that might make you trip as you walk through a door, such as a raised step or threshold.  Trim any bushes or trees on the path to your home.  Use bright outdoor lighting.  Clear any walking paths of anything that might make someone trip, such as rocks or tools.  Regularly check to see if handrails are loose or broken. Make sure that both sides of any steps have handrails.  Any raised decks and porches should have guardrails on the edges.  Have any leaves, snow, or ice cleared regularly.  Use sand or salt on walking paths during winter.  Clean up any spills in your garage right away. This includes oil or grease spills. What can I do in the bathroom?  Use night lights.  Install grab bars by the toilet and in the tub and shower. Do not use towel bars as grab bars.  Use non-skid mats or decals in the tub or shower.  If you need to sit down in the shower, use a plastic, non-slip stool.  Keep the floor dry. Clean up any water that spills on the floor as soon as it happens.  Remove soap buildup in the tub or shower regularly.  Attach bath mats securely with double-sided non-slip rug tape.  Do not have throw rugs and other things on the floor that can make you trip. What can I  do in the bedroom?  Use night lights.  Make sure that you have a light by your bed that is easy to reach.  Do not use any sheets or blankets that are too big for your bed. They should not hang down onto the floor.  Have a firm chair that has side arms. You can use this for support while you get dressed.  Do not have throw rugs and other things on the floor that can make you trip. What can I do in the kitchen?  Clean up any spills right away.  Avoid walking on wet floors.  Keep items that you use a lot in easy-to-reach places.  If you need to reach something above you, use a strong step stool that has a grab bar.  Keep electrical cords out of the way.  Do not use floor polish or wax that makes floors slippery. If you must use wax, use non-skid floor wax.  Do not have throw rugs and other things on the floor that can make you trip. What can I do with my stairs?  Do not leave any items on the stairs.  Make sure that there are handrails on both sides of the stairs and use them. Fix handrails that are broken or loose. Make sure that handrails are as long as  the stairways.  Check any carpeting to make sure that it is firmly attached to the stairs. Fix any carpet that is loose or worn.  Avoid having throw rugs at the top or bottom of the stairs. If you do have throw rugs, attach them to the floor with carpet tape.  Make sure that you have a light switch at the top of the stairs and the bottom of the stairs. If you do not have them, ask someone to add them for you. What else can I do to help prevent falls?  Wear shoes that: ? Do not have high heels. ? Have rubber bottoms. ? Are comfortable and fit you well. ? Are closed at the toe. Do not wear sandals.  If you use a stepladder: ? Make sure that it is fully opened. Do not climb a closed stepladder. ? Make sure that both sides of the stepladder are locked into place. ? Ask someone to hold it for you, if possible.  Clearly mark  and make sure that you can see: ? Any grab bars or handrails. ? First and last steps. ? Where the edge of each step is.  Use tools that help you move around (mobility aids) if they are needed. These include: ? Canes. ? Walkers. ? Scooters. ? Crutches.  Turn on the lights when you go into a dark area. Replace any light bulbs as soon as they burn out.  Set up your furniture so you have a clear path. Avoid moving your furniture around.  If any of your floors are uneven, fix them.  If there are any pets around you, be aware of where they are.  Review your medicines with your doctor. Some medicines can make you feel dizzy. This can increase your chance of falling. Ask your doctor what other things that you can do to help prevent falls. This information is not intended to replace advice given to you by your health care provider. Make sure you discuss any questions you have with your health care provider. Document Released: 05/15/2009 Document Revised: 12/25/2015 Document Reviewed: 08/23/2014 Elsevier Interactive Patient Education  [employer].

## 2017-09-05 ENCOUNTER — Other Ambulatory Visit: Payer: Self-pay | Admitting: Nurse Practitioner

## 2017-09-15 ENCOUNTER — Other Ambulatory Visit: Payer: Self-pay | Admitting: Nurse Practitioner

## 2017-09-15 DIAGNOSIS — E785 Hyperlipidemia, unspecified: Secondary | ICD-10-CM

## 2017-09-26 ENCOUNTER — Telehealth: Payer: Self-pay | Admitting: Nurse Practitioner

## 2017-09-26 ENCOUNTER — Encounter: Payer: Self-pay | Admitting: Nurse Practitioner

## 2017-09-26 NOTE — Telephone Encounter (Signed)
Sister aware that letter is ready to be picked up

## 2017-09-26 NOTE — Telephone Encounter (Signed)
Letter ready to be pick up

## 2017-10-03 ENCOUNTER — Other Ambulatory Visit: Payer: Self-pay | Admitting: Nurse Practitioner

## 2017-10-04 NOTE — Telephone Encounter (Signed)
Last seen 06/06/17  MMM

## 2017-10-06 ENCOUNTER — Encounter (INDEPENDENT_AMBULATORY_CARE_PROVIDER_SITE_OTHER): Payer: Medicare Other | Admitting: *Deleted

## 2017-10-06 NOTE — Progress Notes (Signed)
No shingrix available

## 2017-10-18 ENCOUNTER — Ambulatory Visit: Payer: Medicare Other | Admitting: *Deleted

## 2017-10-18 DIAGNOSIS — Z23 Encounter for immunization: Secondary | ICD-10-CM

## 2017-10-18 NOTE — Progress Notes (Signed)
Pt given Shingrix vaccine 2nd dose Pt tolerated well

## 2017-10-31 ENCOUNTER — Other Ambulatory Visit: Payer: Self-pay | Admitting: Nurse Practitioner

## 2017-11-04 ENCOUNTER — Ambulatory Visit (INDEPENDENT_AMBULATORY_CARE_PROVIDER_SITE_OTHER): Payer: Medicare Other | Admitting: Nurse Practitioner

## 2017-11-04 ENCOUNTER — Encounter: Payer: Self-pay | Admitting: Nurse Practitioner

## 2017-11-04 VITALS — BP 130/83 | HR 69 | Temp 97.1°F | Ht 64.0 in | Wt 179.0 lb

## 2017-11-04 DIAGNOSIS — G9340 Encephalopathy, unspecified: Secondary | ICD-10-CM

## 2017-11-04 DIAGNOSIS — F429 Obsessive-compulsive disorder, unspecified: Secondary | ICD-10-CM

## 2017-11-04 DIAGNOSIS — M810 Age-related osteoporosis without current pathological fracture: Secondary | ICD-10-CM | POA: Diagnosis not present

## 2017-11-04 DIAGNOSIS — F411 Generalized anxiety disorder: Secondary | ICD-10-CM

## 2017-11-04 DIAGNOSIS — E039 Hypothyroidism, unspecified: Secondary | ICD-10-CM

## 2017-11-04 DIAGNOSIS — K219 Gastro-esophageal reflux disease without esophagitis: Secondary | ICD-10-CM | POA: Diagnosis not present

## 2017-11-04 DIAGNOSIS — Z1231 Encounter for screening mammogram for malignant neoplasm of breast: Secondary | ICD-10-CM

## 2017-11-04 DIAGNOSIS — F5101 Primary insomnia: Secondary | ICD-10-CM

## 2017-11-04 DIAGNOSIS — E785 Hyperlipidemia, unspecified: Secondary | ICD-10-CM

## 2017-11-04 MED ORDER — ESOMEPRAZOLE MAGNESIUM 40 MG PO CPDR
40.0000 mg | DELAYED_RELEASE_CAPSULE | Freq: Every day | ORAL | 1 refills | Status: DC
Start: 2017-11-04 — End: 2018-04-12

## 2017-11-04 MED ORDER — ESCITALOPRAM OXALATE 20 MG PO TABS: 20.0000 mg | ORAL_TABLET | Freq: Every day | ORAL | 1 refills | Status: DC

## 2017-11-04 MED ORDER — RISPERIDONE 0.25 MG PO TABS: ORAL_TABLET | ORAL | 1 refills | Status: DC

## 2017-11-04 MED ORDER — LEVOTHYROXINE SODIUM 75 MCG PO TABS: ORAL_TABLET | ORAL | 1 refills | Status: DC

## 2017-11-04 MED ORDER — LOVASTATIN 40 MG PO TABS: ORAL_TABLET | ORAL | 1 refills | Status: DC

## 2017-11-04 MED ORDER — LORAZEPAM 0.5 MG PO TABS: ORAL_TABLET | ORAL | 3 refills | Status: DC

## 2017-11-04 NOTE — Progress Notes (Signed)
Subjective:    Patient ID: Renee Davidson, female    DOB: 11-15-1950, 67 y.o.   MRN: [id number]  HPI   Renee Davidson is here today for follow up of chronic medical problem.  Outpatient Encounter Medications as of 11/04/2017  Medication Sig  . alendronate (FOSAMAX) 70 MG tablet TAKE 1 TABLET EVERY 7 DAYS WITH A FULL GLASS OF WATER ON AN EMPTY STOMACH  . aspirin EC 81 MG tablet Take 81 mg by mouth daily.  . Calcium-Vitamin D-Vitamin K 500-100-40 MG-UNT-MCG CHEW Chew 1 tablet by mouth 2 (two) times daily.   Marland Kitchen escitalopram (LEXAPRO) 20 MG tablet Take 1 tablet (20 mg total) daily by mouth.  Marland Kitchen guaiFENesin (MUCINEX) 600 MG 12 hr tablet Take by mouth daily.  Marland Kitchen levothyroxine (SYNTHROID, LEVOTHROID) 75 MCG tablet TAKE (1) TABLET DAILY BE- FORE BREAKFAST.  Marland Kitchen LORazepam (ATIVAN) 0.5 MG tablet TAKE (1) TABLET THREE TIMES DAILY.  Marland Kitchen lovastatin (MEVACOR) 40 MG tablet TAKE 1 TABLET ONCE DAILY AT BEDTIME  . omeprazole (PRILOSEC) 20 MG capsule TAKE (1) CAPSULE DAILY  . Polyethylene Glycol 3350 (MIRALAX PO) Take by mouth as needed.  . risperiDONE (RISPERDAL) 0.25 MG tablet TAKE 1 TABLET IN THE MORNING AND 2 TABLETS AT BEDTIME     1. Gastroesophageal reflux disease, esophagitis presence not specified  She has not complained lately of any symptoms. She is not on any medications.  2. Hypothyroidism, unspecified type  No problems hat she is aware of.  3. Primary insomnia  Not having any problems. She takes resperadol  With out side effects  4. Encephalopathy  Had as child which caused some significant developmental problems  5. Obsessive-compulsive disorder, unspecified type  On lexapro which helps  6. Hyperlipidemia with target LDL less than 100  Eats whatever the home she lives in feeds her at group home.  7. She can get very anxious at times. She takes ativan daily  8. Osteoporosis, post-menopausal  Has trouble taking fosamax- family would like to change to prolia    New complaints: None  today  [place] history[place] in [place] apartment  Review of Systems  Constitutional: Negative for activity change and appetite change.  HENT: Negative.   Eyes: Negative for pain.  Respiratory: Negative for shortness of breath.   Cardiovascular: Negative for chest pain, palpitations and leg swelling.  Gastrointestinal: Negative for abdominal pain.  Endocrine: Negative for polydipsia.  Genitourinary: Negative.   Skin: Negative for rash.  Neurological: Negative for dizziness, weakness and headaches.  Hematological: Does not bruise/bleed easily.  Psychiatric/Behavioral: Negative.   All other systems reviewed and are negative.      Objective:   Physical Exam  Constitutional: She is oriented to person, place, and time. She appears well-developed and well-nourished.  HENT:  Nose: Nose normal.  Mouth/Throat: Oropharynx is clear and moist.  Eyes: EOM are normal.  Neck: Trachea normal, normal range of motion and full passive range of motion without pain. Neck supple. No JVD present. Carotid bruit is not present. No thyromegaly present.  Cardiovascular: Normal rate, regular rhythm, normal heart sounds and intact distal pulses. Exam reveals no gallop and no friction rub.  No murmur heard. Pulmonary/Chest: Effort normal and breath sounds normal.  Abdominal: Soft. Bowel sounds are normal. She exhibits no distension and no mass. There is no tenderness.  Musculoskeletal: Normal range of motion.  Lymphadenopathy:    She has no cervical adenopathy.  Neurological: She is alert and oriented to person, place,  and time. She has normal reflexes.  Skin: Skin is warm and dry.  Psychiatric: She has a normal mood and affect. Her behavior is normal. Judgment and thought content normal.   BP 130/83   Pulse 69   Temp (!) 97.1 F (36.2 C) (Oral)   Ht 5\' 4"  (1.626 m)   Wt 179 lb (81.2 kg)   BMI 30.73 kg/m         Assessment & Plan:  1. Gastroesophageal reflux disease,  esophagitis presence not specified Avoid spicy foods Do not eat 2 hours prior to bedtime - esomeprazole (NEXIUM) 40 MG capsule; Take 1 capsule (40 mg total) by mouth daily.  Dispense: 90 capsule; Refill: 1  2. Hypothyroidism, unspecified type - levothyroxine (SYNTHROID, LEVOTHROID) 75 MCG tablet; TAKE (1) TABLET DAILY BE- FORE BREAKFAST.  Dispense: 90 tablet; Refill: 1  3. Primary insomnia Bedtime routine - risperiDONE (RISPERDAL) 0.25 MG tablet; TAKE 1 TABLET IN THE MORNING AND 2 TABLETS AT BEDTIME  Dispense: 270 tablet; Refill: 1  4. Encephalopathy  5. Obsessive-compulsive disorder, unspecified type - escitalopram (LEXAPRO) 20 MG tablet; Take 1 tablet (20 mg total) by mouth daily.  Dispense: 90 tablet; Refill: 1  6. Hyperlipidemia with target LDL less than 100 Low fat diet Continue to exercise - lovastatin (MEVACOR) 40 MG tablet; TAKE 1 TABLET ONCE DAILY AT BEDTIME  Dispense: 90 tablet; Refill: 1  7. GAD (generalized anxiety disorder) - LORazepam (ATIVAN) 0.5 MG tablet; TAKE (1) TABLET THREE TIMES DAILY.  Dispense: 90 tablet; Refill: 3  8. Osteoporosis, post-menopausal  9. Screening mammogram, encounter for - MM 3D SCREEN BREAST BILATERAL; Future    Labs pending Health maintenance reviewed Diet and exercise encouraged Continue all meds Follow up  In 6 months   [place], FNP

## 2017-11-04 NOTE — Addendum Note (Signed)
Addended by: Chevis Pretty on: 11/04/2017 04:19 PM   Modules accepted: Orders

## 2017-11-05 LAB — CMP14+EGFR
ALT: 15 IU/L (ref 0–32)
AST: 21 IU/L (ref 0–40)
Albumin/Globulin Ratio: 1.5 (ref 1.2–2.2)
Albumin: 4.1 g/dL (ref 3.6–4.8)
Alkaline Phosphatase: 103 IU/L (ref 39–117)
BUN/Creatinine Ratio: 11 — ABNORMAL LOW (ref 12–28)
BUN: 10 mg/dL (ref 8–27)
Bilirubin Total: 0.3 mg/dL (ref 0.0–1.2)
CO2: 19 mmol/L — ABNORMAL LOW (ref 20–29)
Calcium: 9.5 mg/dL (ref 8.7–10.3)
Chloride: 103 mmol/L (ref 96–106)
Creatinine, Ser: 0.91 mg/dL (ref 0.57–1.00)
GFR calc Af Amer: 75 mL/min/{1.73_m2} (ref >59)
GFR calc non Af Amer: 65 mL/min/{1.73_m2} (ref >59)
Globulin, Total: 2.7 g/dL (ref 1.5–4.5)
Glucose: 83 mg/dL (ref 65–99)
Potassium: 4.3 mmol/L (ref 3.5–5.2)
Sodium: 140 mmol/L (ref 134–144)
Total Protein: 6.8 g/dL (ref 6.0–8.5)

## 2017-11-05 LAB — LIPID PANEL
Chol/HDL Ratio: 2.9 ratio (ref 0.0–4.4)
Cholesterol, Total: 170 mg/dL (ref 100–199)
HDL: 59 mg/dL (ref >39)
LDL Calculated: 97 mg/dL (ref 0–99)
Triglycerides: 69 mg/dL (ref 0–149)
VLDL Cholesterol Cal: 14 mg/dL (ref 5–40)

## 2017-11-05 LAB — THYROID PANEL WITH TSH
Free Thyroxine Index: 2.4 (ref 1.2–4.9)
T3 Uptake Ratio: 26 % (ref 24–39)
T4, Total: 9.3 ug/dL (ref 4.5–12.0)
TSH: 0.428 u[IU]/mL — ABNORMAL LOW (ref 0.450–4.500)

## 2017-11-16 ENCOUNTER — Telehealth: Payer: Self-pay | Admitting: Nurse Practitioner

## 2018-01-23 ENCOUNTER — Encounter: Payer: Self-pay | Admitting: *Deleted

## 2018-01-23 ENCOUNTER — Ambulatory Visit (INDEPENDENT_AMBULATORY_CARE_PROVIDER_SITE_OTHER): Payer: Medicare Other | Admitting: *Deleted

## 2018-01-23 VITALS — BP 107/68 | HR 69 | Ht 63.25 in | Wt 184.0 lb

## 2018-01-23 DIAGNOSIS — Z1231 Encounter for screening mammogram for malignant neoplasm of breast: Secondary | ICD-10-CM | POA: Diagnosis not present

## 2018-01-23 DIAGNOSIS — Z0000 Encounter for general adult medical examination without abnormal findings: Secondary | ICD-10-CM

## 2018-01-23 DIAGNOSIS — Z1211 Encounter for screening for malignant neoplasm of colon: Secondary | ICD-10-CM

## 2018-01-23 DIAGNOSIS — Z1212 Encounter for screening for malignant neoplasm of rectum: Secondary | ICD-10-CM

## 2018-01-23 LAB — HM MAMMOGRAPHY

## 2018-01-23 NOTE — Progress Notes (Addendum)
Subjective:   Renee Davidson is a 67 y.o. female who presents for a Medicare Annual Wellness Visit. [name] has a mental developmental delay. She lives in a retirement community. She has her own apartment and shares that with her cat. She enjoys her community and has a lot of friends that she socializes with often. She is accompanied today by her sister. She has two sisters and both are involved in her care.   Review of Systems    Patient reports that her overall health is unchanged compared to last year.  Musculoskeletal: She has been on Fosamax for 4 years and her sister would like for her to take a holiday from it. Discussed other options and risks vs benefits.   Cardiac Risk Factors include: sedentary lifestyle;advanced age (>13men, >58 women);dyslipidemia;obesity (BMI >30kg/m2)   All other systems negative     Current Medications (verified) Outpatient Encounter Medications as of 01/23/2018  Medication Sig  . alendronate (FOSAMAX) 70 MG tablet TAKE 1 TABLET EVERY 7 DAYS WITH A FULL GLASS OF WATER ON AN EMPTY STOMACH  . aspirin EC 81 MG tablet Take 81 mg by mouth daily.  . Calcium-Vitamin D-Vitamin K 500-100-40 MG-UNT-MCG CHEW Chew 1 tablet by mouth 2 (two) times daily.   Marland Kitchen escitalopram (LEXAPRO) 20 MG tablet Take 1 tablet (20 mg total) by mouth daily.  Marland Kitchen esomeprazole (NEXIUM) 40 MG capsule Take 1 capsule (40 mg total) by mouth daily.  Marland Kitchen guaiFENesin (MUCINEX) 600 MG 12 hr tablet Take by mouth daily.  Marland Kitchen levothyroxine (SYNTHROID, LEVOTHROID) 75 MCG tablet TAKE (1) TABLET DAILY BE- FORE BREAKFAST.  Marland Kitchen LORazepam (ATIVAN) 0.5 MG tablet TAKE (1) TABLET THREE TIMES DAILY.  Marland Kitchen lovastatin (MEVACOR) 40 MG tablet TAKE 1 TABLET ONCE DAILY AT BEDTIME  . Polyethylene Glycol 3350 (MIRALAX PO) Take by mouth as needed.  . risperiDONE (RISPERDAL) 0.25 MG tablet TAKE 1 TABLET IN THE MORNING AND 2 TABLETS AT BEDTIME   No facility-administered encounter medications on file as of 01/23/2018.     Allergies  (verified) Patient has no known allergies.   History: Past Medical History:  Diagnosis Date  . Anxiety   . Depression   . GERD (gastroesophageal reflux disease)   . Hyperlipidemia   . Mental developmental delay   . OCD (obsessive compulsive disorder)   . Osteoporosis   . Thyroid disease    Past Surgical History:  Procedure Laterality Date  . TONSILLECTOMY     Family History  Problem Relation Age of Onset  . Diabetes Father   . Hyperlipidemia Father   . Hypertension Father   . Atrial fibrillation Father   . Hyperlipidemia Sister   . Dysphagia Sister   . GI problems Mother   . Heart disease Maternal Grandfather   . Colon cancer Maternal Grandfather    Social History   Socioeconomic History  . Marital status: Single    Spouse name: Not on file  . Number of children: 0  . Years of education: 35  . Highest education level: 12th grade  Occupational History  . Occupation: retired    Comment: Occupational psychologist  . Financial resource strain: Not hard at all  . Food insecurity:    Worry: Never true    Inability: Never true  . Transportation needs:    Medical: No    Non-medical: No  Tobacco Use  . Smoking status: Never Smoker  . Smokeless tobacco: Never Used  Substance and Sexual Activity  . Alcohol use:  No  . Drug use: No  . Sexual activity: Never  Lifestyle  . Physical activity:    Days per week: 3 days    Minutes per session: 30 min  . Stress: Not at all  Relationships  . Social connections:    Talks on phone: More than three times a week    Gets together: More than three times a week    Attends religious service: More than 4 times per year    Active member of club or organization: Yes    Attends meetings of clubs or organizations: More than 4 times per year    Relationship status: Never married  Other Topics Concern  . Not on file  Social History Narrative  . Not on file    Tobacco Use No.  Clinical Intake:  Pre-visit preparation  completed: No  Pain : No/denies pain     Nutritional Status: BMI > 30  Obese Diabetes: No  How often do you need to have someone help you when you read instructions, pamphlets, or other written materials from your doctor or pharmacy?: 3 - Sometimes What is the last grade level you completed in school?: 12th   Interpreter Needed?: No  Information entered by :: Chong Sicilian, RN   Activities of Daily Living In your present state of health, do you have any difficulty performing the following activities: 01/23/2018 07/04/2017  Hearing? N Y  Comment - slight decrease in hearing- patient not interested in seeing audiologist  Vision? N N  Comment has yearly eye exams -  Difficulty concentrating or making decisions? N N  Comment "not great but it's not bad" no change from last year -  Walking or climbing stairs? N N  Dressing or bathing? N N  Doing errands, shopping? N Y  Comment - sister brings her to appointments or to run errands.  Patient does not drive  Preparing Food and eating ? N -  Using the Toilet? N -  In the past six months, have you accidently leaked urine? N -  Do you have problems with loss of bowel control? N -  Managing your Medications? N -  Comment has pill pack -  Managing your Finances? N -  Housekeeping or managing your Housekeeping? N -  Comment housekeeping -  Some recent data might be hidden     Diet One meal a day provided by retirement community and then she heats food up for others 2 glasses of water a day 3 cans of diet caffeine free dr pepper day  Exercise Current Exercise Habits: Structured exercise class, Type of exercise: walking;stretching;calisthenics, Time (Minutes): 30, Frequency (Times/Week): 3, Weekly Exercise (Minutes/Week): 90, Intensity: Mild, Exercise limited by: None identified   Depression Screen PHQ 2/9 Scores 01/23/2018 11/04/2017 11/04/2017 07/04/2017 06/06/2017 05/25/2017 12/06/2016  PHQ - 2 Score 0 0 0 0 0 0 0  PHQ- 9 Score - - - - -  - -     Fall Risk Fall Risk  01/23/2018 11/04/2017 11/04/2017 07/04/2017 06/06/2017  Falls in the past year? No No No Yes No  Number falls in past yr: - - - 1 -  Injury with Fall? - - - No -  Follow up - - - Falls prevention discussed;Education provided -    Safety Is the patient's home free of loose throw rugs in walkways, pet beds, electrical cords, etc?   yes      Grab bars in the bathroom? yes      Walkin shower?  yes      Shower Seat? yes      Handrails on the stairs?   yes      Adequate lighting?   yes  Patient Care Team: Chevis Pretty, FNP as PCP - General (Nurse Practitioner) Danie Binder, MD as Consulting Physician (Gastroenterology)   No hospitalizations, ER visits, or surgeries this past year.   Objective:    Today's Vitals   01/23/18 1139  BP: 107/68  Pulse: 69  Weight: 184 lb (83.5 kg)  Height: 5' 3.25" (1.607 m)   Body mass index is 32.34 kg/m.  Advanced Directives 07/04/2017 06/30/2016 03/27/2016 01/16/2016 11/23/2015 05/19/2015 03/04/2014  Does Patient Have a Medical Advance Directive? No Yes No No No No Patient has advance directive, copy not in chart  Type of Advance Directive - [place];Living will - - - - Press photographer;Living will  Does patient want to make changes to medical advance directive? - No - Patient declined - - - - -  Copy of [place] in Chart? - No - copy requested - - - - Copy requested from family  Would patient like information on creating a medical advance directive? Yes (ED - Information included in AVS) - No - patient declined information No - patient declined information No - patient declined information No - patient declined information -    Hearing/Vision  normal or No deficits noted during visit.  Cognitive Function: MMSE - Mini Mental State Exam 07/04/2017 06/30/2016 05/19/2015  Orientation to time 2 4 2   Orientation to Place 2 4 1   Registration 3 3 3   Attention/  Calculation 3 4 4   Recall 1 2 0  Language- name 2 objects 2 2 2   Language- repeat 1 1 1   Language- follow 3 step command 3 3 3   Language- read & follow direction 1 1 1   Write a sentence 1 1 1   Copy design 1 0 0  Total score 20 25 18     MMSE not attempted due to mental developmental delay. Results would not be accurate. Attempted Mini Cog. Patient was only able to remember 1 out of 3 words at 5 minutes. She could place the numbers on the clock in correct order but was not able to place the hands correctly. Only drew one hand and it wasn't anywhere near correct.   Normal Cognitive Function Screening: No: As expected    Immunizations and Health Maintenance Immunization History  Administered Date(s) Administered  . Influenza, High Dose Seasonal PF 05/14/2016, 05/06/2017  . Influenza,inj,Quad PF,6+ Mos 05/18/2013, 05/07/2014, 05/06/2015  . Pneumococcal Conjugate-13 01/17/2014  . Pneumococcal Polysaccharide-23 07/01/2015  . Zoster 03/04/2014  . Zoster Recombinat (Shingrix) 07/04/2017, 10/18/2017   Health Maintenance Due  Topic Date Due  . COLONOSCOPY  12/02/1998  . MAMMOGRAM  08/04/2017   Health Maintenance  Topic Date Due  . COLONOSCOPY  12/02/1998  . MAMMOGRAM  08/04/2017  . INFLUENZA VACCINE  03/02/2018  . DEXA SCAN  06/30/2018  . TETANUS/TDAP  05/02/2021  . Hepatitis C Screening  Completed  . PNA vac Low Risk Adult  Completed   Mammogram was done today. Report will follow.     Assessment:   This is a routine wellness examination for [facility].    Plan:    Goals    . Exercise 3x per week (15 min per time)     Increase exercise to walking on days you do not have chair exercises at your apartment complex to  15-30 minutes per session.        Health Maintenance Recommendations: Colorectal cancer screening Cologuard ordered. Will be sent to her sister Amy's residence and she can assist her with it.  Dexa scan 06/2018   Additional Screening Recommendations: Lung: Low  Dose CT Chest recommended if Age 50-80 years, 30 pack-year currently smoking OR have quit w/in 15years. Patient does not qualify. Hepatitis C Screening recommended: no  Today's Orders Orders Placed This Encounter  Procedures  . Cologuard    Keep f/u with Chevis Pretty, FNP and any other specialty appointments you may have Continue current medications Move carefully to avoid falls. Use assistive devices like a can or walker if needed. Aim for at least 150 minutes of moderate activity a week. This can be done with chair exercises if necessary. Stay connected with friends and family Will discuss patient and sister's wish to discontinue Fosamax. Pharmacy will need to be contacted so that they can remove it from the pill pack.   I have personally reviewed and noted the following in the patient's chart:   . Medical and social history . Use of alcohol, tobacco or illicit drugs  . Current medications and supplements . Functional ability and status . Nutritional status . Physical activity . Advanced directives . List of other physicians . Hospitalizations, surgeries, and ER visits in previous 12 months . Vitals . Screenings to include cognitive, depression, and falls . Referrals and appointments  In addition, I have reviewed and discussed with patient certain preventive protocols, quality metrics, and best practice recommendations. A written personalized care plan for preventive services as well as general preventive health recommendations were provided to patient.     Chong Sicilian, RN   01/23/2018   I have reviewed and agree with the above AWV documentation.   Jerzi-Margaret Hassell Done, FNP

## 2018-01-23 NOTE — Patient Instructions (Addendum)
  Renee Davidson , Thank you for taking time to come for your Medicare Wellness Visit. I appreciate your ongoing commitment to your health goals. Please review the following plan we discussed and let me know if I can assist you in the future.   These are the goals we discussed: Goals    . Exercise 3x per week (15 min per time)     Increase exercise to walking on days you do not have chair exercises at your apartment complex to 15-30 minutes per session.       This is a list of the screening recommended for you and due dates:  Health Maintenance  Topic Date Due  . Colon Cancer Screening  12/02/1998  . Mammogram  08/04/2017  . Flu Shot  03/02/2018  . DEXA scan (bone density measurement)  06/30/2018  . Tetanus Vaccine  05/02/2021  .  Hepatitis C: One time screening is recommended by Center for Disease Control  (CDC) for  adults born from [birth year] through 1967.   Completed  . Pneumonia vaccines  Completed    Increase water intake. Drink water with meals and decrease soda. Drink one soda a day.  Stop fosamax after this month. I will talk with Shelah Lewandowsky about options for osteoporosis treatment.   I will order Cologuard and have it sent to [name].

## 2018-01-24 ENCOUNTER — Telehealth: Payer: Self-pay | Admitting: *Deleted

## 2018-01-24 NOTE — Telephone Encounter (Signed)
Discussed discontinuing Fosamax with Chevis Pretty, FNP and she gave authorization. [place] and advised them to remove it from her medication list. They will not package it any longer. Sister and patient aware of plan as well.

## 2018-01-29 DIAGNOSIS — Z1211 Encounter for screening for malignant neoplasm of colon: Secondary | ICD-10-CM | POA: Diagnosis not present

## 2018-01-29 DIAGNOSIS — Z1212 Encounter for screening for malignant neoplasm of rectum: Secondary | ICD-10-CM | POA: Diagnosis not present

## 2018-02-05 ENCOUNTER — Encounter (HOSPITAL_COMMUNITY): Payer: Self-pay | Admitting: Emergency Medicine

## 2018-02-05 ENCOUNTER — Emergency Department (HOSPITAL_COMMUNITY)
Admission: EM | Admit: 2018-02-05 | Discharge: 2018-02-06 | Disposition: A | Discharge status: Home / Self Care | Payer: Medicare Other | Attending: Emergency Medicine | Admitting: Emergency Medicine

## 2018-02-05 ENCOUNTER — Emergency Department (HOSPITAL_COMMUNITY): Payer: Medicare Other

## 2018-02-05 ENCOUNTER — Other Ambulatory Visit: Payer: Self-pay

## 2018-02-05 DIAGNOSIS — W010XXA Fall on same level from slipping, tripping and stumbling without subsequent striking against object, initial encounter: Secondary | ICD-10-CM | POA: Diagnosis not present

## 2018-02-05 DIAGNOSIS — Y939 Activity, unspecified: Secondary | ICD-10-CM | POA: Insufficient documentation

## 2018-02-05 DIAGNOSIS — S8991XA Unspecified injury of right lower leg, initial encounter: Secondary | ICD-10-CM | POA: Diagnosis present

## 2018-02-05 DIAGNOSIS — E039 Hypothyroidism, unspecified: Secondary | ICD-10-CM | POA: Diagnosis not present

## 2018-02-05 DIAGNOSIS — Z7982 Long term (current) use of aspirin: Secondary | ICD-10-CM | POA: Diagnosis not present

## 2018-02-05 DIAGNOSIS — Y929 Unspecified place or not applicable: Secondary | ICD-10-CM | POA: Insufficient documentation

## 2018-02-05 DIAGNOSIS — Z79899 Other long term (current) drug therapy: Secondary | ICD-10-CM | POA: Insufficient documentation

## 2018-02-05 DIAGNOSIS — Y999 Unspecified external cause status: Secondary | ICD-10-CM | POA: Diagnosis not present

## 2018-02-05 DIAGNOSIS — M25561 Pain in right knee: Secondary | ICD-10-CM | POA: Diagnosis not present

## 2018-02-05 DIAGNOSIS — S8001XA Contusion of right knee, initial encounter: Secondary | ICD-10-CM | POA: Diagnosis not present

## 2018-02-05 NOTE — ED Triage Notes (Signed)
Pt tripped and fell in her home tonight around 7pm on the carpet. Right knee swollen, and bruised.

## 2018-02-06 DIAGNOSIS — S8001XA Contusion of right knee, initial encounter: Secondary | ICD-10-CM | POA: Diagnosis not present

## 2018-02-06 MED ORDER — HYDROCODONE-ACETAMINOPHEN 5-325 MG PO TABS
1.0000 | ORAL_TABLET | Freq: Once | ORAL | Status: AC
  Administered 2018-02-06: 1 via ORAL
  Filled 2018-02-06: qty 1

## 2018-02-06 MED ORDER — HYDROCODONE-ACETAMINOPHEN 5-325 MG PO TABS: 1.0000 | ORAL_TABLET | ORAL | 0 refills | Status: DC | PRN

## 2018-02-06 NOTE — ED Provider Notes (Signed)
[facility] EMERGENCY DEPARTMENT Provider Note   CSN: [id number] Arrival date & time: 02/05/18  2136     History   Chief Complaint Chief Complaint  Patient presents with  . Knee Injury    HPI Renee Davidson is a 67 y.o. female.  Patient presents to the emergency department for evaluation of right knee injury.  Patient reports that she fell earlier tonight, landing directly on the right knee.  Patient having moderate to severe pain in the right knee with swelling.  Pain worsens when she bends it or tries to bear weight.  She did not hit her head or injure herself anywhere else.     Past Medical History:  Diagnosis Date  . Anxiety   . Depression   . GERD (gastroesophageal reflux disease)   . Hyperlipidemia   . Mental developmental delay   . OCD (obsessive compulsive disorder)   . Osteoporosis   . Thyroid disease     Patient Active Problem List   Diagnosis Date Noted  . Primary insomnia 06/06/2017  . Encephalopathy 03/27/2016  . Hypothyroidism 01/17/2014  . Hyperlipidemia with target LDL less than 100 01/17/2014  . GAD (generalized anxiety disorder) 01/17/2014  . GERD (gastroesophageal reflux disease) 01/17/2014  . OCD (obsessive compulsive disorder) 01/17/2014  . Osteoporosis, post-menopausal 12/05/2013    Past Surgical History:  Procedure Laterality Date  . TONSILLECTOMY       OB History   None      Home Medications    Prior to Admission medications   Medication Sig Start Date End Date Taking? Authorizing Provider  aspirin EC 81 MG tablet Take 81 mg by mouth daily.    [provider]  Calcium-Vitamin D-Vitamin K 500-100-40 MG-UNT-MCG CHEW Chew 1 tablet by mouth 2 (two) times daily.     [provider]  escitalopram (LEXAPRO) 20 MG tablet Take 1 tablet (20 mg total) by mouth daily. 11/04/17   Hassell Done, Josiah-Margaret, FNP  esomeprazole (NEXIUM) 40 MG capsule Take 1 capsule (40 mg total) by mouth daily. 11/04/17   Hassell Done, Latricia-Margaret, FNP    guaiFENesin (MUCINEX) 600 MG 12 hr tablet Take by mouth daily.    [provider]  HYDROcodone-acetaminophen (NORCO/VICODIN) 5-325 MG tablet Take 1-2 tablets by mouth every 4 (four) hours as needed. 02/06/18   Orpah Greek, MD  HYDROcodone-acetaminophen (NORCO/VICODIN) 5-325 MG tablet Take 1-2 tablets by mouth every 4 (four) hours as needed for moderate pain. 02/06/18   Orpah Greek, MD  levothyroxine (SYNTHROID, LEVOTHROID) 75 MCG tablet TAKE (1) TABLET DAILY BE- FORE BREAKFAST. 11/04/17   Hassell Done, Trishia-Margaret, FNP  LORazepam (ATIVAN) 0.5 MG tablet TAKE (1) TABLET THREE TIMES DAILY. 11/04/17   Hassell Done, Leila-Margaret, FNP  lovastatin (MEVACOR) 40 MG tablet TAKE 1 TABLET ONCE DAILY AT BEDTIME 11/04/17   Hassell Done, Kloe-Margaret, FNP  Polyethylene Glycol 3350 (MIRALAX PO) Take by mouth as needed.    [provider]  risperiDONE (RISPERDAL) 0.25 MG tablet TAKE 1 TABLET IN THE MORNING AND 2 TABLETS AT BEDTIME 11/04/17   Chevis Pretty, FNP    Family History Family History  Problem Relation Age of Onset  . Diabetes Father   . Hyperlipidemia Father   . Hypertension Father   . Atrial fibrillation Father   . Hyperlipidemia Sister   . Dysphagia Sister   . GI problems Mother   . Heart disease Maternal Grandfather   . Colon cancer Maternal Grandfather     Social History Social History   Tobacco Use  .  Smoking status: Never Smoker  . Smokeless tobacco: Never Used  Substance Use Topics  . Alcohol use: No  . Drug use: No     Allergies   Patient has no known allergies.   Review of Systems Review of Systems  Musculoskeletal: Positive for arthralgias.  All other systems reviewed and are negative.    Physical Exam Updated Vital Signs BP (!) 142/75 (BP Location: Right Arm)   Pulse 66   Temp 97.7 F (36.5 C) (Oral)   Resp 18   Ht 5' 3.25" (1.607 m)   Wt 83.5 kg (184 lb)   SpO2 99%   BMI 32.34 kg/m   Physical Exam  Constitutional: She is  oriented to person, place, and time. She appears well-developed and well-nourished. No distress.  HENT:  Head: Normocephalic and atraumatic.  Right Ear: Hearing normal.  Left Ear: Hearing normal.  Nose: Nose normal.  Mouth/Throat: Oropharynx is clear and moist and mucous membranes are normal.  Eyes: Pupils are equal, round, and reactive to light. Conjunctivae and EOM are normal.  Neck: Normal range of motion. Neck supple.  Cardiovascular: Regular rhythm, S1 normal and S2 normal. Exam reveals no gallop and no friction rub.  No murmur heard. Pulses:      Dorsalis pedis pulses are 2+ on the right side.  Pulmonary/Chest: Effort normal and breath sounds normal. No respiratory distress. She exhibits no tenderness.  Abdominal: Soft. Normal appearance and bowel sounds are normal. There is no hepatosplenomegaly. There is no tenderness. There is no rebound, no guarding, no tenderness at McBurney's point and negative Murphy's sign. No hernia.  Musculoskeletal:       Right hip: Normal.       Right knee: She exhibits decreased range of motion, swelling, effusion (Prepatellar) and ecchymosis. She exhibits no deformity. Tenderness found.       Right ankle: Normal.  Neurological: She is alert and oriented to person, place, and time. She has normal strength. No cranial nerve deficit or sensory deficit. Coordination normal. GCS eye subscore is 4. GCS verbal subscore is 5. GCS motor subscore is 6.  Skin: Skin is warm, dry and intact. Ecchymosis (Right knee) noted. No rash noted. No cyanosis.  Psychiatric: She has a normal mood and affect. Her speech is normal and behavior is normal. Thought content normal.  Nursing note and vitals reviewed.    ED Treatments / Results  Labs (all labs ordered are listed, but only abnormal results are displayed) Labs Reviewed - No data to display  EKG None  Radiology Dg Knee Complete 4 Views Right  Result Date: 02/05/2018 CLINICAL DATA:  Posttraumatic right knee pain  and bruising. Initial encounter. EXAM: RIGHT KNEE - COMPLETE 4+ VIEW COMPARISON:  None. FINDINGS: Prominent soft tissue swelling anteriorly. There may be soft tissue emphysema at the level of the joint, but no opaque foreign body. No joint effusion, fracture, or malalignment. IMPRESSION: Soft tissue swelling without fracture. Electronically Signed   By: Monte Fantasia M.D.   On: 02/05/2018 22:13    Procedures Procedures (including critical care time)  Medications Ordered in ED Medications  HYDROcodone-acetaminophen (NORCO/VICODIN) 5-325 MG per tablet 1 tablet (has no administration in time range)     Initial Impression / Assessment and Plan / ED Course  I have reviewed the triage vital signs and the nursing notes.  Pertinent labs & imaging results that were available during my care of the patient were reviewed by me and considered in my medical decision making (see chart for  details).     Patient presents to the ER for evaluation of isolated right knee injury.  Patient had a fall at home earlier tonight.  She landed directly on the right knee.  Patient has a large prepatellar effusion on examination.  There is associated ecchymosis.  I do not appreciate any significant joint effusion.  X-ray does not show any abnormality.  Do not appreciate any obvious patella tendon injury.  No ligamentous laxity on exam.  She is neurovascularly intact.  No evidence of any other injury.  Patient will be treated with analgesia, knee immobilization.  She will rest and ice the area.  Follow-up with orthopedics  Final Clinical Impressions(s) / ED Diagnoses   Final diagnoses:  Contusion of right knee, initial encounter    ED Discharge Orders        Ordered    HYDROcodone-acetaminophen (NORCO/VICODIN) 5-325 MG tablet  Every 4 hours PRN     02/06/18 0026    HYDROcodone-acetaminophen (NORCO/VICODIN) 5-325 MG tablet  Every 4 hours PRN     02/06/18 0026       Orpah Greek, MD 02/06/18 [phone]

## 2018-02-06 NOTE — ED Notes (Signed)
Pt wheeled to waiting room. Pt verbalized understanding of discharge instructions.   

## 2018-02-07 ENCOUNTER — Ambulatory Visit (INDEPENDENT_AMBULATORY_CARE_PROVIDER_SITE_OTHER): Payer: Medicare Other | Admitting: Orthopaedic Surgery

## 2018-02-07 ENCOUNTER — Encounter: Payer: Self-pay | Admitting: Orthopaedic Surgery

## 2018-02-07 VITALS — BP 112/75 | HR 65 | Ht 65.5 in | Wt 180.0 lb

## 2018-02-07 DIAGNOSIS — M25561 Pain in right knee: Secondary | ICD-10-CM | POA: Diagnosis not present

## 2018-02-07 NOTE — Progress Notes (Signed)
Subjective:    Patient ID: Renee Davidson, female    DOB: Apr 24, 1951, 67 y.o.   MRN: [id number]  HPI She tripped over her own feet 02-05-18 and fell and hurt her right knee.  She was seen in the ER. X-rays were negative.  She was given knee immobilizer and told to use a walker.  She has no other injury.  She has developed significant ecchymosis around the medial knee on the right.  ROM is tender but full.  She has a large hematoma medially near the patella.  She has been using ice.  Pain is controlled with Advil.   Review of Systems  Constitutional: Positive for activity change.  Musculoskeletal: Positive for arthralgias, gait problem and joint swelling.  Psychiatric/Behavioral: The patient is nervous/anxious.   All other systems reviewed and are negative.  For Review of Systems, all other systems reviewed and are negative.  Past Medical History:  Diagnosis Date  . Anxiety   . Depression   . GERD (gastroesophageal reflux disease)   . Hyperlipidemia   . Mental developmental delay   . OCD (obsessive compulsive disorder)   . Osteoporosis   . Thyroid disease     Past Surgical History:  Procedure Laterality Date  . TONSILLECTOMY      Current Outpatient Medications on File Prior to Visit  Medication Sig Dispense Refill  . aspirin EC 81 MG tablet Take 81 mg by mouth daily.    . Calcium-Vitamin D-Vitamin K 500-100-40 MG-UNT-MCG CHEW Chew 1 tablet by mouth 2 (two) times daily.     Marland Kitchen escitalopram (LEXAPRO) 20 MG tablet Take 1 tablet (20 mg total) by mouth daily. 90 tablet 1  . esomeprazole (NEXIUM) 40 MG capsule Take 1 capsule (40 mg total) by mouth daily. 90 capsule 1  . guaiFENesin (MUCINEX) 600 MG 12 hr tablet Take by mouth daily.    Marland Kitchen HYDROcodone-acetaminophen (NORCO/VICODIN) 5-325 MG tablet Take 1-2 tablets by mouth every 4 (four) hours as needed. 6 tablet 0  . HYDROcodone-acetaminophen (NORCO/VICODIN) 5-325 MG tablet Take 1-2 tablets by mouth every 4 (four) hours as needed for  moderate pain. 20 tablet 0  . levothyroxine (SYNTHROID, LEVOTHROID) 75 MCG tablet TAKE (1) TABLET DAILY BE- FORE BREAKFAST. 90 tablet 1  . LORazepam (ATIVAN) 0.5 MG tablet TAKE (1) TABLET THREE TIMES DAILY. 90 tablet 3  . lovastatin (MEVACOR) 40 MG tablet TAKE 1 TABLET ONCE DAILY AT BEDTIME 90 tablet 1  . Polyethylene Glycol 3350 (MIRALAX PO) Take by mouth as needed.    . risperiDONE (RISPERDAL) 0.25 MG tablet TAKE 1 TABLET IN THE MORNING AND 2 TABLETS AT BEDTIME 270 tablet 1   No current facility-administered medications on file prior to visit.     Social History   Socioeconomic History  . Marital status: Single    Spouse name: Not on file  . Number of children: 0  . Years of education: 65  . Highest education level: 12th grade  Occupational History  . Occupation: retired    Comment: Occupational psychologist  . Financial resource strain: Not hard at all  . Food insecurity:    Worry: Never true    Inability: Never true  . Transportation needs:    Medical: No    Non-medical: No  Tobacco Use  . Smoking status: Never Smoker  . Smokeless tobacco: Never Used  Substance and Sexual Activity  . Alcohol use: No  . Drug use: No  . Sexual activity: Never  Lifestyle  .  Physical activity:    Days per week: 3 days    Minutes per session: 30 min  . Stress: Not at all  Relationships  . Social connections:    Talks on phone: More than three times a week    Gets together: More than three times a week    Attends religious service: More than 4 times per year    Active member of club or organization: Yes    Attends meetings of clubs or organizations: More than 4 times per year    Relationship status: Never married  . Intimate partner violence:    Fear of current or ex partner: No    Emotionally abused: No    Physically abused: No    Forced sexual activity: No  Other Topics Concern  . Not on file  Social History Narrative  . Not on file    Family History  Problem Relation Age  of Onset  . Diabetes Father   . Hyperlipidemia Father   . Hypertension Father   . Atrial fibrillation Father   . Hyperlipidemia Sister   . Dysphagia Sister   . GI problems Mother   . Heart disease Maternal Grandfather   . Colon cancer Maternal Grandfather     BP 112/75   Pulse 65   Ht 5' 5.5" (1.664 m)   Wt 180 lb (81.6 kg)   BMI 29.50 kg/m   Body mass index is 29.5 kg/m.      Objective:   Physical Exam  Constitutional: She is oriented to person, place, and time. She appears well-developed and well-nourished.  HENT:  Head: Normocephalic and atraumatic.  Eyes: Pupils are equal, round, and reactive to light. Conjunctivae and EOM are normal.  Neck: Normal range of motion. Neck supple.  Cardiovascular: Normal rate, regular rhythm and intact distal pulses.  Pulmonary/Chest: Effort normal.  Abdominal: Soft.  Musculoskeletal:       Right knee: She exhibits swelling. Tenderness found.       Legs: Neurological: She is alert and oriented to person, place, and time. She has normal reflexes. She displays normal reflexes. No cranial nerve deficit. She exhibits normal muscle tone. Coordination normal.  Skin: Skin is warm and dry.  Psychiatric: She has a normal mood and affect. Her behavior is normal. Judgment and thought content normal.     I have reviewed the ER records, x-rays and report.     Assessment & Plan:   Encounter Diagnosis  Name Primary?  . Acute pain of right knee Yes   I have told her to use ice next two days and then to use heat.  Elevate, use walker.  She can stop the knee immobilizer.  I will see her in one week.  Call if any problem.  Precautions discussed.   Electronically Signed Sanjuana Kava, MD 7/9/201910:17 AM

## 2018-02-08 MED FILL — Hydrocodone-Acetaminophen Tab 5-325 MG: ORAL | Qty: 6 | Status: AC

## 2018-02-10 ENCOUNTER — Emergency Department (HOSPITAL_COMMUNITY): Payer: Medicare Other

## 2018-02-10 ENCOUNTER — Other Ambulatory Visit: Payer: Self-pay

## 2018-02-10 ENCOUNTER — Encounter (HOSPITAL_COMMUNITY): Payer: Self-pay | Admitting: Emergency Medicine

## 2018-02-10 ENCOUNTER — Telehealth: Payer: Self-pay | Admitting: Orthopaedic Surgery

## 2018-02-10 ENCOUNTER — Emergency Department (HOSPITAL_COMMUNITY)
Admission: EM | Admit: 2018-02-10 | Discharge: 2018-02-10 | Disposition: A | Discharge status: Home / Self Care | Payer: Medicare Other | Attending: Emergency Medicine | Admitting: Emergency Medicine

## 2018-02-10 ENCOUNTER — Telehealth: Payer: Self-pay | Admitting: Nurse Practitioner

## 2018-02-10 DIAGNOSIS — Z79899 Other long term (current) drug therapy: Secondary | ICD-10-CM | POA: Insufficient documentation

## 2018-02-10 DIAGNOSIS — Y9301 Activity, walking, marching and hiking: Secondary | ICD-10-CM | POA: Insufficient documentation

## 2018-02-10 DIAGNOSIS — R58 Hemorrhage, not elsewhere classified: Secondary | ICD-10-CM | POA: Insufficient documentation

## 2018-02-10 DIAGNOSIS — Z7982 Long term (current) use of aspirin: Secondary | ICD-10-CM | POA: Diagnosis not present

## 2018-02-10 DIAGNOSIS — M7989 Other specified soft tissue disorders: Secondary | ICD-10-CM | POA: Diagnosis not present

## 2018-02-10 DIAGNOSIS — R2241 Localized swelling, mass and lump, right lower limb: Secondary | ICD-10-CM | POA: Diagnosis not present

## 2018-02-10 DIAGNOSIS — S8001XA Contusion of right knee, initial encounter: Secondary | ICD-10-CM | POA: Diagnosis not present

## 2018-02-10 DIAGNOSIS — E039 Hypothyroidism, unspecified: Secondary | ICD-10-CM | POA: Insufficient documentation

## 2018-02-10 DIAGNOSIS — W0110XA Fall on same level from slipping, tripping and stumbling with subsequent striking against unspecified object, initial encounter: Secondary | ICD-10-CM | POA: Diagnosis not present

## 2018-02-10 DIAGNOSIS — Y929 Unspecified place or not applicable: Secondary | ICD-10-CM | POA: Insufficient documentation

## 2018-02-10 DIAGNOSIS — Y999 Unspecified external cause status: Secondary | ICD-10-CM | POA: Insufficient documentation

## 2018-02-10 DIAGNOSIS — S8991XA Unspecified injury of right lower leg, initial encounter: Secondary | ICD-10-CM | POA: Diagnosis not present

## 2018-02-10 MED ORDER — NYSTATIN 100000 UNIT/GM EX CREA: TOPICAL_CREAM | CUTANEOUS | 1 refills | Status: DC

## 2018-02-10 NOTE — Telephone Encounter (Signed)
Pt's sister sent pics for evaluation Per MMM pt needs to go to ER for evaluation for clot Pt's sister notified of recommendation Verbalizes understanding

## 2018-02-10 NOTE — ED Provider Notes (Addendum)
[facility] EMERGENCY DEPARTMENT Provider Note   CSN: [id number] Arrival date & time: 02/10/18  1059     History   Chief Complaint Chief Complaint  Patient presents with  . Bleeding/Bruising    HPI Renee Davidson is a 67 y.o. female.  HPI  The pt is a 67 y/o female - presents with her daughter after she had a fall a few days ago - she has had ongoing swelling, bruising and pain of her R knee and leg and was sent here by PCP to get an ultrasound to r/o DVT - the injury was a direct impact over the patella, she had initial imaging which was negative for fracture.  She has been ambulatory but with some discomfort.  No prior history of DVT, symptoms are persistent, gradually worsening, no associated chest pain or shortness of breath.  Past Medical History:  Diagnosis Date  . Anxiety   . Depression   . GERD (gastroesophageal reflux disease)   . Hyperlipidemia   . Mental developmental delay   . OCD (obsessive compulsive disorder)   . Osteoporosis   . Thyroid disease     Patient Active Problem List   Diagnosis Date Noted  . Primary insomnia 06/06/2017  . Encephalopathy 03/27/2016  . Hypothyroidism 01/17/2014  . Hyperlipidemia with target LDL less than 100 01/17/2014  . GAD (generalized anxiety disorder) 01/17/2014  . GERD (gastroesophageal reflux disease) 01/17/2014  . OCD (obsessive compulsive disorder) 01/17/2014  . Osteoporosis, post-menopausal 12/05/2013    Past Surgical History:  Procedure Laterality Date  . TONSILLECTOMY       OB History   None      Home Medications    Prior to Admission medications   Medication Sig Start Date End Date Taking? Authorizing Provider  aspirin EC 81 MG tablet Take 81 mg by mouth daily.   Yes [provider]  Calcium-Vitamin D-Vitamin K 500-100-40 MG-UNT-MCG CHEW Chew 1 tablet by mouth 2 (two) times daily.    Yes [provider]  escitalopram (LEXAPRO) 20 MG tablet Take 1 tablet (20 mg total) by mouth daily.  11/04/17  Yes Martin, Deeann-Margaret, FNP  esomeprazole (NEXIUM) 40 MG capsule Take 1 capsule (40 mg total) by mouth daily. 11/04/17  Yes Hassell Done, Yanessa-Margaret, FNP  HYDROcodone-acetaminophen (NORCO/VICODIN) 5-325 MG tablet Take 1-2 tablets by mouth every 4 (four) hours as needed for moderate pain. 02/06/18  Yes Pollina, Gwenyth Allegra, MD  ibuprofen (ADVIL,MOTRIN) 200 MG tablet Take 400 mg by mouth every 8 (eight) hours as needed for moderate pain.   Yes [provider]  levothyroxine (SYNTHROID, LEVOTHROID) 75 MCG tablet TAKE (1) TABLET DAILY BE- FORE BREAKFAST. 11/04/17  Yes Hassell Done, Yamilette-Margaret, FNP  LORazepam (ATIVAN) 0.5 MG tablet TAKE (1) TABLET THREE TIMES DAILY. 11/04/17  Yes Hassell Done, Joeline-Margaret, FNP  lovastatin (MEVACOR) 40 MG tablet TAKE 1 TABLET ONCE DAILY AT BEDTIME 11/04/17  Yes Hassell Done, Zalma-Margaret, FNP  risperiDONE (RISPERDAL) 0.25 MG tablet TAKE 1 TABLET IN THE MORNING AND 2 TABLETS AT BEDTIME 11/04/17  Yes Hassell Done Darilyn-Margaret, FNP    Family History Family History  Problem Relation Age of Onset  . Diabetes Father   . Hyperlipidemia Father   . Hypertension Father   . Atrial fibrillation Father   . Hyperlipidemia Sister   . Dysphagia Sister   . GI problems Mother   . Heart disease Maternal Grandfather   . Colon cancer Maternal Grandfather     Social History Social History   Tobacco Use  . Smoking status:  Never Smoker  . Smokeless tobacco: Never Used  Substance Use Topics  . Alcohol use: No  . Drug use: No     Allergies   Patient has no known allergies.   Review of Systems Review of Systems  All other systems reviewed and are negative.    Physical Exam Updated Vital Signs BP 103/65   Pulse 65   Temp 99 F (37.2 C) (Oral)   Resp 20   Ht 5' 5.5" (1.664 m)   Wt 81.6 kg (180 lb)   SpO2 99%   BMI 29.50 kg/m   Physical Exam  Constitutional: She appears well-developed and well-nourished. No distress.  HENT:  Head: Normocephalic and atraumatic.    Mouth/Throat: Oropharynx is clear and moist. No oropharyngeal exudate.  Eyes: Pupils are equal, round, and reactive to light. Conjunctivae and EOM are normal. Right eye exhibits no discharge. Left eye exhibits no discharge. No scleral icterus.  Neck: Normal range of motion. Neck supple. No JVD present. No thyromegaly present.  Cardiovascular: Normal rate, regular rhythm, normal heart sounds and intact distal pulses. Exam reveals no gallop and no friction rub.  No murmur heard. Pulmonary/Chest: Effort normal and breath sounds normal. No respiratory distress. She has no wheezes. She has no rales.  Abdominal: Soft. Bowel sounds are normal. She exhibits no distension and no mass. There is no tenderness.  Musculoskeletal: She exhibits tenderness. She exhibits no edema.  There is a prepatellar hematoma as well as bruising extending over the anterior knee and wrapping around to the medial surface on the right leg.  She has decreased range of motion of the right knee but normal range of motion of the hip and the ankle on that side.  She has a preserved ability to straight leg raise.  Lymphadenopathy:    She has no cervical adenopathy.  Neurological: She is alert. Coordination normal.  Skin: Skin is warm and dry. Rash ( under the R breast - there is a yeast candidal rash) noted. No erythema.  Psychiatric: She has a normal mood and affect. Her behavior is normal.  Nursing note and vitals reviewed.    ED Treatments / Results  Labs (all labs ordered are listed, but only abnormal results are displayed) Labs Reviewed - No data to display  EKG None  Radiology No results found.  Procedures Procedures (including critical care time)  Medications Ordered in ED Medications - No data to display   Initial Impression / Assessment and Plan / ED Course  I have reviewed the triage vital signs and the nursing notes.  Pertinent labs & imaging results that were available during my care of the patient  were reviewed by me and considered in my medical decision making (see chart for details).    Exam is consistent with spreading ecchymosis and hematoma, doubt DVT given the location of the injury however as she was sent from the family doctor's office and with increased swelling of the leg it is reasonable to rule that out.   DVT not seen - bruising  - cryotherapy and supportive care Family updated and given reassurance  Final Clinical Impressions(s) / ED Diagnoses   Final diagnoses:  Ecchymosis  Traumatic hematoma of right knee, initial encounter    ED Discharge Orders    None       Noemi Chapel, MD 02/10/18 1349    Noemi Chapel, MD 02/10/18 1441

## 2018-02-10 NOTE — Telephone Encounter (Signed)
Pt's sister, Amy Ingram(DPR has been signed for Amy) called stating that the back of Renee Davidson's knee is black.  She is unsure as to what to do for this.  They have been icing and elevating it.  Please advise Renee Davidson on what to do.  Her phone number is [phone]  Thanks

## 2018-02-10 NOTE — Telephone Encounter (Signed)
She has large hematoma now has large area behind her knee that is "black" I have advised the bruising does often go  Down the leg, follows gravity.   Dr Luna Glasgow  told her to use ice next two days and then to use heat.  Elevate, use walker.  She can stop the knee immobilizer.  I did speak to her sister, and she has been doing all these things. Sister states she is concerned about blood clot. I have advised to call primary care or go to the emergency room if primary care can not see her right away/ she will call primary care now.   To you FYI

## 2018-02-10 NOTE — Discharge Instructions (Signed)
Ice for the next several days to a week, you may use over-the-counter medications such as Tylenol or ibuprofen however expect bruising and swelling for as long as another 2 or 3 weeks as this does take quite some time to heal.  Follow-up with your doctor as needed.

## 2018-02-10 NOTE — ED Triage Notes (Signed)
Pt had a fall on Sunday. Pt was evaluated in ED, xrays performed and pt was to f/u with ortho. Pt noticed worsening bruising and swelling to RT leg. Sent over by PCP for possible [place].

## 2018-02-10 NOTE — ED Notes (Signed)
Patient has reddened, moist skin underneath right breast with odor, states burns and itches unable to state when irritation began.

## 2018-02-10 NOTE — ED Notes (Signed)
Pt left without prescription for nystatin.  Called pt and daughter instructed to call prescription to [facility] [place].  Called prescription in for nystatin as written by Dr. Sabra Heck.

## 2018-02-15 ENCOUNTER — Ambulatory Visit (INDEPENDENT_AMBULATORY_CARE_PROVIDER_SITE_OTHER): Payer: Medicare Other | Admitting: Orthopaedic Surgery

## 2018-02-15 ENCOUNTER — Encounter: Payer: Self-pay | Admitting: Orthopaedic Surgery

## 2018-02-15 VITALS — BP 108/73 | HR 73 | Ht 65.5 in | Wt 189.0 lb

## 2018-02-15 DIAGNOSIS — M25561 Pain in right knee: Secondary | ICD-10-CM | POA: Diagnosis not present

## 2018-02-15 DIAGNOSIS — L03115 Cellulitis of right lower limb: Secondary | ICD-10-CM | POA: Diagnosis not present

## 2018-02-15 DIAGNOSIS — M7041 Prepatellar bursitis, right knee: Secondary | ICD-10-CM

## 2018-02-15 NOTE — Progress Notes (Signed)
Patient Renee Davidson, female DOB:Aug 14, 1950, 67 y.o. OZH:[id number]  Chief Complaint  Patient presents with  . Follow-up    R Knee pain 02/05/18    HPI  Renee Davidson is a 67 y.o. female who had an injury to the right knee on 02-05-18 and had a hematoma near the right patella medially.  I saw her in the office 02-07-18.  She had more pain and swelling of the leg and went to the ER on 02-10-18.  A Doppler was done and it was negative.  She had developed more of a prepatella swelling on the 12th.  She had an appointment here today.  Her anterior right knee has turned red since Monday the 15th.  She has no drainage, no new trauma.  She has no fever or chills.  She is using her walker.  She has used ice to the knee.   Body mass index is 30.97 kg/m.  ROS  Review of Systems  Constitutional: Positive for activity change.  Musculoskeletal: Positive for arthralgias, gait problem and joint swelling.  Psychiatric/Behavioral: The patient is nervous/anxious.   All other systems reviewed and are negative.   All other systems reviewed and are negative.  Past Medical History:  Diagnosis Date  . Anxiety   . Depression   . GERD (gastroesophageal reflux disease)   . Hyperlipidemia   . Mental developmental delay   . OCD (obsessive compulsive disorder)   . Osteoporosis   . Thyroid disease     Past Surgical History:  Procedure Laterality Date  . TONSILLECTOMY      Family History  Problem Relation Age of Onset  . Diabetes Father   . Hyperlipidemia Father   . Hypertension Father   . Atrial fibrillation Father   . Hyperlipidemia Sister   . Dysphagia Sister   . GI problems Mother   . Heart disease Maternal Grandfather   . Colon cancer Maternal Grandfather     Social History Social History   Tobacco Use  . Smoking status: Never Smoker  . Smokeless tobacco: Never Used  Substance Use Topics  . Alcohol use: No  . Drug use: No    No Known Allergies  Current Outpatient Medications   Medication Sig Dispense Refill  . aspirin EC 81 MG tablet Take 81 mg by mouth daily.    . Calcium-Vitamin D-Vitamin K 500-100-40 MG-UNT-MCG CHEW Chew 1 tablet by mouth 2 (two) times daily.     Marland Kitchen escitalopram (LEXAPRO) 20 MG tablet Take 1 tablet (20 mg total) by mouth daily. 90 tablet 1  . esomeprazole (NEXIUM) 40 MG capsule Take 1 capsule (40 mg total) by mouth daily. 90 capsule 1  . HYDROcodone-acetaminophen (NORCO/VICODIN) 5-325 MG tablet Take 1-2 tablets by mouth every 4 (four) hours as needed for moderate pain. 20 tablet 0  . ibuprofen (ADVIL,MOTRIN) 200 MG tablet Take 400 mg by mouth every 8 (eight) hours as needed for moderate pain.    Marland Kitchen levothyroxine (SYNTHROID, LEVOTHROID) 75 MCG tablet TAKE (1) TABLET DAILY BE- FORE BREAKFAST. 90 tablet 1  . LORazepam (ATIVAN) 0.5 MG tablet TAKE (1) TABLET THREE TIMES DAILY. 90 tablet 3  . lovastatin (MEVACOR) 40 MG tablet TAKE 1 TABLET ONCE DAILY AT BEDTIME 90 tablet 1  . nystatin cream (MYCOSTATIN) Apply to affected area 2 times daily 30 g 1  . risperiDONE (RISPERDAL) 0.25 MG tablet TAKE 1 TABLET IN THE MORNING AND 2 TABLETS AT BEDTIME 270 tablet 1   No current facility-administered medications for this visit.  Physical Exam  Blood pressure 108/73, pulse 73, height 5' 5.5" (1.664 m), weight 189 lb (85.7 kg).  Constitutional: overall normal hygiene, normal nutrition, well developed, normal grooming, normal body habitus. Assistive device:walker  Musculoskeletal: gait and station Limp right, muscle tone and strength are normal, no tremors or atrophy is present.  .  Neurological: coordination overall normal.  Deep tendon reflex/nerve stretch intact.  Sensation normal.  Cranial nerves II-XII intact.   Skin:   Normal overall no scars, lesions, ulcers or rashes. No psoriasis.  Psychiatric: Alert and oriented x 3.  Recent memory intact, remote memory unclear.  Normal mood and affect. Well groomed.  Good eye contact.  Cardiovascular: overall  no swelling, no varicosities, no edema bilaterally, normal temperatures of the legs and arms, no clubbing, cyanosis and good capillary refill.  Lymphatic: palpation is normal.  The right knee has erythema anteriorly over the patella area and has a large amount of swelling all prepatella.  There is no drainage.  ROM is decreased. There is significant resolving ecchymosis of the right knee anterior and posterior.  The prepatella swelling is more firm than fluctuant consistent more with a large hematoma.  All other systems reviewed and are negative   The patient has been educated about the nature of the problem(s) and counseled on treatment options.  The patient appeared to understand what I have discussed and is in agreement with it.  Encounter Diagnoses  Name Primary?  . Cellulitis of right knee   . Acute pain of right knee Yes  . Prepatellar bursitis of right knee    Procedure note: After permission from the patient and after sterile prep, the area of the right knee over the prepatella area was aspirated of blood thick fluid about 5 cc.  This was done by sterile technique tolerated well.  Fluid sent for culture and sensitivity.  I also squeezed the area and expressed some of the clot from the needle hole.  I had used a 16 gauge needle.  PLAN Call if any problems.  Precautions discussed.  I have given samples of doxycyline 100 to take with instructions.  Return to clinic tomorrow.   Go to ER if the knee gets worse.  Electronically Signed Sanjuana Kava, MD 7/17/20192:14 PM

## 2018-02-16 ENCOUNTER — Ambulatory Visit (INDEPENDENT_AMBULATORY_CARE_PROVIDER_SITE_OTHER): Payer: Medicare Other | Admitting: Orthopaedic Surgery

## 2018-02-16 ENCOUNTER — Encounter: Payer: Self-pay | Admitting: Orthopaedic Surgery

## 2018-02-16 VITALS — BP 125/75 | HR 69 | Temp 97.9°F | Ht 65.5 in | Wt 189.0 lb

## 2018-02-16 DIAGNOSIS — M7041 Prepatellar bursitis, right knee: Secondary | ICD-10-CM

## 2018-02-16 DIAGNOSIS — L03115 Cellulitis of right lower limb: Secondary | ICD-10-CM

## 2018-02-16 NOTE — Progress Notes (Signed)
Patient Renee Davidson, female DOB:[birth year], 67 y.o. ONG:[id number]  Chief Complaint  Patient presents with  . Knee Pain    right     HPI  Renee Davidson is a 67 y.o. female who has cellulitis of the right knee.  I saw her yesterday and began doxycycline from samples in the office.  I did aspiration of the hemartoma and had her to come back today to access progress.  She has less pain, less redness of the cellulitis of the prepatellar area of the right knee.  She has taken the medicine.  She has no new trauma.   Body mass index is 30.97 kg/m.  ROS  Review of Systems  Constitutional: Positive for activity change.  Musculoskeletal: Positive for arthralgias, gait problem and joint swelling.  Psychiatric/Behavioral: The patient is nervous/anxious.   All other systems reviewed and are negative.   All other systems reviewed and are negative.  Past Medical History:  Diagnosis Date  . Anxiety   . Depression   . GERD (gastroesophageal reflux disease)   . Hyperlipidemia   . Mental developmental delay   . OCD (obsessive compulsive disorder)   . Osteoporosis   . Thyroid disease     Past Surgical History:  Procedure Laterality Date  . TONSILLECTOMY      Family History  Problem Relation Age of Onset  . Diabetes Father   . Hyperlipidemia Father   . Hypertension Father   . Atrial fibrillation Father   . Hyperlipidemia Sister   . Dysphagia Sister   . GI problems Mother   . Heart disease Maternal Grandfather   . Colon cancer Maternal Grandfather     Social History Social History   Tobacco Use  . Smoking status: Never Smoker  . Smokeless tobacco: Never Used  Substance Use Topics  . Alcohol use: No  . Drug use: No    No Known Allergies  Current Outpatient Medications  Medication Sig Dispense Refill  . aspirin EC 81 MG tablet Take 81 mg by mouth daily.    . Calcium-Vitamin D-Vitamin K 500-100-40 MG-UNT-MCG CHEW Chew 1 tablet by mouth 2 (two) times daily.     Marland Kitchen  doxycycline (VIBRAMYCIN) 100 MG capsule Take 100 mg by mouth 2 (two) times daily.    Marland Kitchen escitalopram (LEXAPRO) 20 MG tablet Take 1 tablet (20 mg total) by mouth daily. 90 tablet 1  . esomeprazole (NEXIUM) 40 MG capsule Take 1 capsule (40 mg total) by mouth daily. 90 capsule 1  . ibuprofen (ADVIL,MOTRIN) 200 MG tablet Take 400 mg by mouth every 8 (eight) hours as needed for moderate pain.    Marland Kitchen levothyroxine (SYNTHROID, LEVOTHROID) 75 MCG tablet TAKE (1) TABLET DAILY BE- FORE BREAKFAST. 90 tablet 1  . LORazepam (ATIVAN) 0.5 MG tablet TAKE (1) TABLET THREE TIMES DAILY. 90 tablet 3  . lovastatin (MEVACOR) 40 MG tablet TAKE 1 TABLET ONCE DAILY AT BEDTIME 90 tablet 1  . nystatin cream (MYCOSTATIN) Apply to affected area 2 times daily 30 g 1  . risperiDONE (RISPERDAL) 0.25 MG tablet TAKE 1 TABLET IN THE MORNING AND 2 TABLETS AT BEDTIME 270 tablet 1  . HYDROcodone-acetaminophen (NORCO/VICODIN) 5-325 MG tablet Take 1-2 tablets by mouth every 4 (four) hours as needed for moderate pain. (Patient not taking: Reported on 02/16/2018) 20 tablet 0   No current facility-administered medications for this visit.      Physical Exam  Blood pressure 125/75, pulse 69, temperature 97.9 F (36.6 C), temperature source Tympanic, height 5'  5.5" (1.664 m), weight 189 lb (85.7 kg).  Constitutional: overall normal hygiene, normal nutrition, well developed, normal grooming, normal body habitus. Assistive device:walker  Musculoskeletal: gait and station Limp rihgt, muscle tone and strength are normal, no tremors or atrophy is present.  .  Neurological: coordination overall normal.  Deep tendon reflex/nerve stretch intact.  Sensation normal.  Cranial nerves II-XII intact.   Skin:   Normal overall no scars, lesions, ulcers or rashes. No psoriasis.She has redness over the prepatellar area with swelling of the prepatellar bursa.  She has much less redness than yesterday.  She has less hematoma swelling than  yesterday.  Psychiatric: Alert and oriented x 3.  Recent memory intact, remote memory unclear.  Normal mood and affect. Well groomed.  Good eye contact.  Cardiovascular: overall no swelling, no varicosities, no edema bilaterally, normal temperatures of the legs and arms, no clubbing, cyanosis and good capillary refill.  Lymphatic: palpation is normal.  All other systems reviewed and are negative   The patient has been educated about the nature of the problem(s) and counseled on treatment options.  The patient appeared to understand what I have discussed and is in agreement with it.  Encounter Diagnoses  Name Primary?  . Cellulitis of right knee Yes  . Prepatellar bursitis of right knee    Procedure note: After permission from the patient and sterile prep, I attempted aspiration of the hematoma of the prepatellar area of the right knee.  I was able to squeeze some blood clots from the injection hole.  This was done by sterile technique and tolerated well.  PLAN Call if any problems.  Precautions discussed.  Continue current medications.   Return to clinic Tuesday.   She is to go to the ER if she develops more redness, red streaks or any problem.  Electronically Signed Sanjuana Kava, MD 7/18/201910:50 AM

## 2018-02-18 LAB — WOUND CULTURE
MICRO NUMBER:: 90846656
RESULT:: NO GROWTH
SPECIMEN QUALITY:: ADEQUATE

## 2018-02-21 ENCOUNTER — Encounter: Payer: Self-pay | Admitting: Orthopaedic Surgery

## 2018-02-21 ENCOUNTER — Ambulatory Visit (INDEPENDENT_AMBULATORY_CARE_PROVIDER_SITE_OTHER): Payer: Medicare Other | Admitting: Orthopaedic Surgery

## 2018-02-21 VITALS — BP 127/74 | HR 61 | Temp 96.7°F | Ht 65.5 in | Wt 180.0 lb

## 2018-02-21 DIAGNOSIS — M25561 Pain in right knee: Secondary | ICD-10-CM

## 2018-02-21 DIAGNOSIS — L03115 Cellulitis of right lower limb: Secondary | ICD-10-CM | POA: Diagnosis not present

## 2018-02-21 DIAGNOSIS — M7041 Prepatellar bursitis, right knee: Secondary | ICD-10-CM

## 2018-02-21 NOTE — Progress Notes (Signed)
Patient Renee Davidson, female DOB:06-Oct-1950, 67 y.o. [id number]  Chief Complaint  Patient presents with  . Follow-up    right knee cellulitis    HPI  Renee Davidson is a 67 y.o. female who has cellulitis of the right knee prepatellar area. She has less pain, less redness.  She is taking the doxycycline.  I will have her continue it one more week, one 100 mgm per day.Wound culture was negative.  The family requests a MRI of the right knee. I will order this.  They asked about medicine for yeast.  She has no yeast problem.  If they would like such a medicine, I asked that they contact her family doctor.   Body mass index is 29.5 kg/m.  ROS  Review of Systems  Constitutional: Positive for activity change.  Musculoskeletal: Positive for arthralgias, gait problem and joint swelling.  Psychiatric/Behavioral: The patient is nervous/anxious.   All other systems reviewed and are negative.   All other systems reviewed and are negative.  Past Medical History:  Diagnosis Date  . Anxiety   . Depression   . GERD (gastroesophageal reflux disease)   . Hyperlipidemia   . Mental developmental delay   . OCD (obsessive compulsive disorder)   . Osteoporosis   . Thyroid disease     Past Surgical History:  Procedure Laterality Date  . TONSILLECTOMY      Family History  Problem Relation Age of Onset  . Diabetes Father   . Hyperlipidemia Father   . Hypertension Father   . Atrial fibrillation Father   . Hyperlipidemia Sister   . Dysphagia Sister   . GI problems Mother   . Heart disease Maternal Grandfather   . Colon cancer Maternal Grandfather     Social History Social History   Tobacco Use  . Smoking status: Never Smoker  . Smokeless tobacco: Never Used  Substance Use Topics  . Alcohol use: No  . Drug use: No    No Known Allergies  Current Outpatient Medications  Medication Sig Dispense Refill  . aspirin EC 81 MG tablet Take 81 mg by mouth daily.    .  Calcium-Vitamin D-Vitamin K 500-100-40 MG-UNT-MCG CHEW Chew 1 tablet by mouth 2 (two) times daily.     Marland Kitchen doxycycline (VIBRAMYCIN) 100 MG capsule Take 100 mg by mouth 2 (two) times daily.    Marland Kitchen escitalopram (LEXAPRO) 20 MG tablet Take 1 tablet (20 mg total) by mouth daily. 90 tablet 1  . esomeprazole (NEXIUM) 40 MG capsule Take 1 capsule (40 mg total) by mouth daily. 90 capsule 1  . HYDROcodone-acetaminophen (NORCO/VICODIN) 5-325 MG tablet Take 1-2 tablets by mouth every 4 (four) hours as needed for moderate pain. (Patient not taking: Reported on 02/16/2018) 20 tablet 0  . ibuprofen (ADVIL,MOTRIN) 200 MG tablet Take 400 mg by mouth every 8 (eight) hours as needed for moderate pain.    Marland Kitchen levothyroxine (SYNTHROID, LEVOTHROID) 75 MCG tablet TAKE (1) TABLET DAILY BE- FORE BREAKFAST. 90 tablet 1  . LORazepam (ATIVAN) 0.5 MG tablet TAKE (1) TABLET THREE TIMES DAILY. 90 tablet 3  . lovastatin (MEVACOR) 40 MG tablet TAKE 1 TABLET ONCE DAILY AT BEDTIME 90 tablet 1  . nystatin cream (MYCOSTATIN) Apply to affected area 2 times daily 30 g 1  . risperiDONE (RISPERDAL) 0.25 MG tablet TAKE 1 TABLET IN THE MORNING AND 2 TABLETS AT BEDTIME 270 tablet 1   No current facility-administered medications for this visit.      Physical Exam  Blood pressure 127/74, pulse 61, temperature (!) 96.7 F (35.9 C), height 5' 5.5" (1.664 m), weight 180 lb (81.6 kg).  Constitutional: overall normal hygiene, normal nutrition, well developed, normal grooming, normal body habitus. Assistive device:walker  Musculoskeletal: gait and station Limp right, muscle tone and strength are normal, no tremors or atrophy is present.  .  Neurological: coordination overall normal.  Deep tendon reflex/nerve stretch intact.  Sensation normal.  Cranial nerves II-XII intact.   Skin:   Normal overall no scars, lesions, ulcers or rashes. No psoriasis.  Psychiatric: Alert and oriented x 3.  Recent memory intact, remote memory unclear.  Normal mood  and affect. Well groomed.  Good eye contact.  Cardiovascular: overall no swelling, no varicosities, no edema bilaterally, normal temperatures of the legs and arms, no clubbing, cyanosis and good capillary refill.  Lymphatic: palpation is normal.  The right knee has redness slight of the prepatellar area of the right knee and a large hematoma present.  The redness is much less and the hematoma is about the same, the size of a large lemon.  NV intact.  ROM is tender but 0 to 105.  All other systems reviewed and are negative   The patient has been educated about the nature of the problem(s) and counseled on treatment options.  The patient appeared to understand what I have discussed and is in agreement with it.  Encounter Diagnoses  Name Primary?  . Cellulitis of right knee Yes  . Prepatellar bursitis of right knee   . Acute pain of right knee     PLAN Call if any problems.  Precautions discussed.  Continue current medications.   Return to clinic 1 week   Get the MRI.  Electronically Signed Sanjuana Kava, MD 7/23/201911:20 AM

## 2018-02-22 ENCOUNTER — Encounter: Payer: Self-pay | Admitting: Orthopaedic Surgery

## 2018-02-22 DIAGNOSIS — M76891 Other specified enthesopathies of right lower limb, excluding foot: Secondary | ICD-10-CM | POA: Diagnosis not present

## 2018-02-22 DIAGNOSIS — M6289 Other specified disorders of muscle: Secondary | ICD-10-CM | POA: Diagnosis not present

## 2018-02-22 DIAGNOSIS — M25461 Effusion, right knee: Secondary | ICD-10-CM | POA: Diagnosis not present

## 2018-02-22 DIAGNOSIS — R600 Localized edema: Secondary | ICD-10-CM | POA: Diagnosis not present

## 2018-02-22 DIAGNOSIS — L03115 Cellulitis of right lower limb: Secondary | ICD-10-CM | POA: Diagnosis not present

## 2018-03-01 ENCOUNTER — Encounter: Payer: Self-pay | Admitting: Orthopaedic Surgery

## 2018-03-01 ENCOUNTER — Ambulatory Visit (INDEPENDENT_AMBULATORY_CARE_PROVIDER_SITE_OTHER): Payer: Medicare Other | Admitting: Orthopaedic Surgery

## 2018-03-01 VITALS — BP 140/86 | HR 72 | Ht 66.0 in | Wt 187.0 lb

## 2018-03-01 DIAGNOSIS — L03115 Cellulitis of right lower limb: Secondary | ICD-10-CM | POA: Diagnosis not present

## 2018-03-01 DIAGNOSIS — M7041 Prepatellar bursitis, right knee: Secondary | ICD-10-CM | POA: Diagnosis not present

## 2018-03-01 NOTE — Progress Notes (Signed)
Patient Renee Davidson, female DOB:March 07, 1951, 67 y.o. [name]  Chief Complaint  Patient presents with  . Knee Pain    right    HPI  Renee Davidson is a 67 y.o. female who has a hematoma of the right knee in the prepatellar area and also had cellulitis of the knee.  She has been on doxycycline which has worked well until yesterday when she broke out in a rash.  She has stopped the medicine.  I would have stopped it today.  She has no new trauma.  She feels better, has no redness and is walking better with her walker.   Body mass index is 30.18 kg/m.  ROS  Review of Systems  Constitutional: Positive for activity change.  Musculoskeletal: Positive for arthralgias, gait problem and joint swelling.  Psychiatric/Behavioral: The patient is nervous/anxious.   All other systems reviewed and are negative.   All other systems reviewed and are negative.  Past Medical History:  Diagnosis Date  . Anxiety   . Depression   . GERD (gastroesophageal reflux disease)   . Hyperlipidemia   . Mental developmental delay   . OCD (obsessive compulsive disorder)   . Osteoporosis   . Thyroid disease     Past Surgical History:  Procedure Laterality Date  . TONSILLECTOMY      Family History  Problem Relation Age of Onset  . Diabetes Father   . Hyperlipidemia Father   . Hypertension Father   . Atrial fibrillation Father   . Hyperlipidemia Sister   . Dysphagia Sister   . GI problems Mother   . Heart disease Maternal Grandfather   . Colon cancer Maternal Grandfather     Social History Social History   Tobacco Use  . Smoking status: Never Smoker  . Smokeless tobacco: Never Used  Substance Use Topics  . Alcohol use: No  . Drug use: No    Allergies  Allergen Reactions  . Doxycycline Rash    Current Outpatient Medications  Medication Sig Dispense Refill  . aspirin EC 81 MG tablet Take 81 mg by mouth daily.    . Calcium-Vitamin D-Vitamin K 500-100-40 MG-UNT-MCG CHEW Chew 1  tablet by mouth 2 (two) times daily.     Marland Kitchen doxycycline (VIBRAMYCIN) 100 MG capsule Take 100 mg by mouth 2 (two) times daily.    Marland Kitchen escitalopram (LEXAPRO) 20 MG tablet Take 1 tablet (20 mg total) by mouth daily. 90 tablet 1  . esomeprazole (NEXIUM) 40 MG capsule Take 1 capsule (40 mg total) by mouth daily. 90 capsule 1  . HYDROcodone-acetaminophen (NORCO/VICODIN) 5-325 MG tablet Take 1-2 tablets by mouth every 4 (four) hours as needed for moderate pain. (Patient not taking: Reported on 02/16/2018) 20 tablet 0  . ibuprofen (ADVIL,MOTRIN) 200 MG tablet Take 400 mg by mouth every 8 (eight) hours as needed for moderate pain.    Marland Kitchen levothyroxine (SYNTHROID, LEVOTHROID) 75 MCG tablet TAKE (1) TABLET DAILY BE- FORE BREAKFAST. 90 tablet 1  . LORazepam (ATIVAN) 0.5 MG tablet TAKE (1) TABLET THREE TIMES DAILY. 90 tablet 3  . lovastatin (MEVACOR) 40 MG tablet TAKE 1 TABLET ONCE DAILY AT BEDTIME 90 tablet 1  . nystatin cream (MYCOSTATIN) Apply to affected area 2 times daily 30 g 1  . risperiDONE (RISPERDAL) 0.25 MG tablet TAKE 1 TABLET IN THE MORNING AND 2 TABLETS AT BEDTIME 270 tablet 1   No current facility-administered medications for this visit.      Physical Exam  Blood pressure 140/86, pulse 72,  height 5\' 6"  (1.676 m), weight 187 lb (84.8 kg).  Constitutional: overall normal hygiene, normal nutrition, well developed, normal grooming, normal body habitus. Assistive device:walker  Musculoskeletal: gait and station Limp right, muscle tone and strength are normal, no tremors or atrophy is present.  .  Neurological: coordination overall normal.  Deep tendon reflex/nerve stretch intact.  Sensation normal.  Cranial nerves II-XII intact.   Skin:   Normal overall no scars, lesions, ulcers.  No psoriasis.  She has rash on her face.  Psychiatric: Alert and oriented x 3.  Recent memory intact, remote memory unclear.  Normal mood and affect. Well groomed.  Good eye contact.  Cardiovascular: overall no  swelling, no varicosities, no edema bilaterally, normal temperatures of the legs and arms, no clubbing, cyanosis and good capillary refill.  Lymphatic: palpation is normal.  The right anterior knee has a large hematoma more medially prepatellar area.  It is not as large as it was in the past.  It is not red now and no erythema is present.  ROM is improved to 0 to 110. She is walking better.  NV intact.  She does has a rash on her face. All other systems reviewed and are negative   The patient has been educated about the nature of the problem(s) and counseled on treatment options.  The patient appeared to understand what I have discussed and is in agreement with it.  Encounter Diagnoses  Name Primary?  . Cellulitis of right knee Yes  . Prepatellar bursitis of right knee     PLAN Call if any problems.  Precautions discussed.Stop the antibiotic.  Return to clinic 3 weeks   Use warm compresses.  Electronically Signed Sanjuana Kava, MD 7/31/201910:20 AM

## 2018-03-03 ENCOUNTER — Other Ambulatory Visit: Payer: Self-pay | Admitting: Nurse Practitioner

## 2018-03-03 DIAGNOSIS — F411 Generalized anxiety disorder: Secondary | ICD-10-CM

## 2018-03-06 NOTE — Progress Notes (Unsigned)
In Care Everywhere  

## 2018-03-23 ENCOUNTER — Ambulatory Visit (INDEPENDENT_AMBULATORY_CARE_PROVIDER_SITE_OTHER): Payer: Medicare Other | Admitting: Orthopaedic Surgery

## 2018-03-23 ENCOUNTER — Encounter: Payer: Self-pay | Admitting: Orthopaedic Surgery

## 2018-03-23 VITALS — BP 115/73 | HR 73 | Ht 66.0 in | Wt 187.0 lb

## 2018-03-23 DIAGNOSIS — M7041 Prepatellar bursitis, right knee: Secondary | ICD-10-CM

## 2018-03-23 NOTE — Progress Notes (Signed)
Patient [name], female DOB:[birth year], 67 y.o. YWV:[id number]  Chief Complaint  Patient presents with  . Knee Pain    right knee feels better    HPI  Renee Davidson Renee Davidson Davidson is a 67 y.o. female who has prepatellar bursitis of the right knee.  She has less pain, less swelling and no new redness.  She has no new trauma.  She feels better but still has some pain at times.  She has no giving way.   Body mass index is 30.18 kg/m.  ROS  Review of Systems  Constitutional: Positive for activity change.  Musculoskeletal: Positive for arthralgias, gait problem and joint swelling.  Psychiatric/Behavioral: The patient is nervous/anxious.   All other systems reviewed and are negative.   All other systems reviewed and are negative.  The following is a summary of the past history medically, past history surgically, known current medicines, social history and family history.  This information is gathered electronically by the computer from prior information and documentation.  I review this each visit and have found including this information at this point in the chart is beneficial and informative.    Past Medical History:  Diagnosis Date  . Anxiety   . Depression   . GERD (gastroesophageal reflux disease)   . Hyperlipidemia   . Mental developmental delay   . OCD (obsessive compulsive disorder)   . Osteoporosis   . Thyroid disease     Past Surgical History:  Procedure Laterality Date  . TONSILLECTOMY      Family History  Problem Relation Age of Onset  . Diabetes Father   . Hyperlipidemia Father   . Hypertension Father   . Atrial fibrillation Father   . Hyperlipidemia Sister   . Dysphagia Sister   . GI problems Mother   . Heart disease Maternal Grandfather   . Colon cancer Maternal Grandfather     Social History Social History   Tobacco Use  . Smoking status: Never Smoker  . Smokeless tobacco: Never Used  Substance Use Topics  . Alcohol use: No  . Drug use: No     Allergies  Allergen Reactions  . Doxycycline Rash    Current Outpatient Medications  Medication Sig Dispense Refill  . aspirin EC 81 MG tablet Take 81 mg by mouth daily.    . Calcium-Vitamin D-Vitamin K 500-100-40 MG-UNT-MCG CHEW Chew 1 tablet by mouth 2 (two) times daily.     Marland Kitchen escitalopram (LEXAPRO) 20 MG tablet Take 1 tablet (20 mg total) by mouth daily. 90 tablet 1  . esomeprazole (NEXIUM) 40 MG capsule Take 1 capsule (40 mg total) by mouth daily. 90 capsule 1  . ibuprofen (ADVIL,MOTRIN) 200 MG tablet Take 400 mg by mouth every 8 (eight) hours as needed for moderate pain.    Marland Kitchen levothyroxine (SYNTHROID, LEVOTHROID) 75 MCG tablet TAKE (1) TABLET DAILY BE- FORE BREAKFAST. 90 tablet 1  . LORazepam (ATIVAN) 0.5 MG tablet TAKE (1) TABLET THREE TIMES DAILY. 90 tablet 0  . lovastatin (MEVACOR) 40 MG tablet TAKE 1 TABLET ONCE DAILY AT BEDTIME 90 tablet 1  . nystatin cream (MYCOSTATIN) Apply to affected area 2 times daily 30 g 1  . risperiDONE (RISPERDAL) 0.25 MG tablet TAKE 1 TABLET IN THE MORNING AND 2 TABLETS AT BEDTIME 270 tablet 1   No current facility-administered medications for this visit.      Physical Exam  Blood pressure 115/73, pulse 73, height 5\' 6"  (1.676 m), weight 187 lb (84.8 kg).  Constitutional: overall normal  hygiene, normal nutrition, well developed, normal grooming, normal body habitus. Assistive device:walker  Musculoskeletal: gait and station Limp none, muscle tone and strength are normal, no tremors or atrophy is present.  .  Neurological: coordination overall normal.  Deep tendon reflex/nerve stretch intact.  Sensation normal.  Cranial nerves II-XII intact.   Skin:   Normal overall no scars, lesions, ulcers or rashes. No psoriasis.  Psychiatric: Alert and oriented x 3.  Recent memory intact, remote memory unclear.  Normal mood and affect. Well groomed.  Good eye contact.  Cardiovascular: overall no swelling, no varicosities, no edema bilaterally, normal  temperatures of the legs and arms, no clubbing, cyanosis and good capillary refill.  Lymphatic: palpation is normal.  The right knee has a small prepatellar swelling but no redness. ROM is improved to 0 to 115.  She has no limp.  She still uses a walker.  NV is intact.    All other systems reviewed and are negative   The patient has been educated about the nature of the problem(s) and counseled on treatment options.  The patient appeared to understand what I have discussed and is in agreement with it.  Encounter Diagnosis  Name Primary?  . Prepatellar bursitis of right knee Yes    PLAN Call if any problems.  Precautions discussed.  Continue current medications.   Return to clinic 1 month   Electronically Signed Sanjuana Kava, MD 8/22/201910:20 AM

## 2018-04-12 ENCOUNTER — Other Ambulatory Visit: Payer: Self-pay | Admitting: Nurse Practitioner

## 2018-04-12 DIAGNOSIS — F411 Generalized anxiety disorder: Secondary | ICD-10-CM

## 2018-04-12 DIAGNOSIS — K219 Gastro-esophageal reflux disease without esophagitis: Secondary | ICD-10-CM

## 2018-04-13 NOTE — Telephone Encounter (Signed)
Last seen 11/04/17  MMM

## 2018-04-19 ENCOUNTER — Encounter: Payer: Self-pay | Admitting: Orthopaedic Surgery

## 2018-04-19 ENCOUNTER — Ambulatory Visit (INDEPENDENT_AMBULATORY_CARE_PROVIDER_SITE_OTHER): Payer: Medicare Other | Admitting: Orthopaedic Surgery

## 2018-04-19 VITALS — BP 113/73 | HR 69 | Ht 66.0 in | Wt 186.0 lb

## 2018-04-19 DIAGNOSIS — M7041 Prepatellar bursitis, right knee: Secondary | ICD-10-CM

## 2018-04-19 NOTE — Progress Notes (Signed)
CC:  My knee does not hurt  She has done well with the right knee.  The cellulitis and prepatellar bursitis has resolved.  She has no pain.  She is walking well.  NV intact. ROM of the right knee is full.  Encounter Diagnosis  Name Primary?  . Prepatellar bursitis of right knee Yes   I will see her as needed.  Call if any problem.  Precautions discussed.   Electronically Signed Sanjuana Kava, MD 9/18/201910:28 AM

## 2018-05-09 ENCOUNTER — Encounter: Payer: Self-pay | Admitting: Nurse Practitioner

## 2018-05-09 ENCOUNTER — Ambulatory Visit (INDEPENDENT_AMBULATORY_CARE_PROVIDER_SITE_OTHER): Payer: Medicare Other | Admitting: Nurse Practitioner

## 2018-05-09 VITALS — BP 139/87 | HR 66 | Temp 96.5°F | Ht 66.0 in | Wt 194.0 lb

## 2018-05-09 DIAGNOSIS — G9340 Encephalopathy, unspecified: Secondary | ICD-10-CM | POA: Diagnosis not present

## 2018-05-09 DIAGNOSIS — E785 Hyperlipidemia, unspecified: Secondary | ICD-10-CM

## 2018-05-09 DIAGNOSIS — Z23 Encounter for immunization: Secondary | ICD-10-CM | POA: Diagnosis not present

## 2018-05-09 DIAGNOSIS — F411 Generalized anxiety disorder: Secondary | ICD-10-CM

## 2018-05-09 DIAGNOSIS — B372 Candidiasis of skin and nail: Secondary | ICD-10-CM

## 2018-05-09 DIAGNOSIS — B351 Tinea unguium: Secondary | ICD-10-CM | POA: Diagnosis not present

## 2018-05-09 DIAGNOSIS — K219 Gastro-esophageal reflux disease without esophagitis: Secondary | ICD-10-CM | POA: Diagnosis not present

## 2018-05-09 DIAGNOSIS — M79676 Pain in unspecified toe(s): Secondary | ICD-10-CM | POA: Diagnosis not present

## 2018-05-09 DIAGNOSIS — F5101 Primary insomnia: Secondary | ICD-10-CM | POA: Diagnosis not present

## 2018-05-09 DIAGNOSIS — E039 Hypothyroidism, unspecified: Secondary | ICD-10-CM

## 2018-05-09 DIAGNOSIS — F429 Obsessive-compulsive disorder, unspecified: Secondary | ICD-10-CM

## 2018-05-09 MED ORDER — LOVASTATIN 40 MG PO TABS: ORAL_TABLET | ORAL | 1 refills | Status: DC

## 2018-05-09 MED ORDER — ESOMEPRAZOLE MAGNESIUM 40 MG PO CPDR: 40.0000 mg | DELAYED_RELEASE_CAPSULE | Freq: Every day | ORAL | 0 refills | Status: DC

## 2018-05-09 MED ORDER — LEVOTHYROXINE SODIUM 75 MCG PO TABS: ORAL_TABLET | ORAL | 1 refills | Status: DC

## 2018-05-09 MED ORDER — ESCITALOPRAM OXALATE 20 MG PO TABS: 20.0000 mg | ORAL_TABLET | Freq: Every day | ORAL | 1 refills | Status: DC

## 2018-05-09 MED ORDER — NYSTATIN 100000 UNIT/GM EX CREA: TOPICAL_CREAM | CUTANEOUS | 5 refills | Status: DC

## 2018-05-09 MED ORDER — RISPERIDONE 0.25 MG PO TABS: ORAL_TABLET | ORAL | 1 refills | Status: DC

## 2018-05-09 MED ORDER — LORAZEPAM 0.5 MG PO TABS: 0.5000 mg | ORAL_TABLET | Freq: Three times a day (TID) | ORAL | 2 refills | Status: DC | PRN

## 2018-05-09 NOTE — Progress Notes (Signed)
Subjective:    Patient ID: Renee Davidson, female    DOB: [birth year], 67 y.o.   MRN: [id number]   Chief Complaint: Medical Management of Chronic Issues   HPI:  1. Gastroesophageal reflux disease, esophagitis presence not specified  Patient is on nexium daily and has not complained to care giver about any symptoms.  2. Hypothyroidism, unspecified type  No problems that they are aware of.  3. Primary insomnia  She does no sleep well. ifthey do not give her something she will be up all night.  4. Hyperlipidemia with target LDL less than 100  She eats whatever the group home fixes for her.  5. GAD (generalized anxiety disorder)  lexapro daily helps her to stay calm.  6. Obsessive-compulsive disorder, unspecified type  lexapro helps.  7. Encephalopathy  Had this as child which causes developmental delays.    Outpatient Encounter Medications as of 05/09/2018  Medication Sig  . aspirin EC 81 MG tablet Take 81 mg by mouth daily.  Marland Kitchen escitalopram (LEXAPRO) 20 MG tablet Take 1 tablet (20 mg total) by mouth daily.  Marland Kitchen esomeprazole (NEXIUM) 40 MG capsule Take 1 capsule (40 mg total) by mouth daily.  Marland Kitchen levothyroxine (SYNTHROID, LEVOTHROID) 75 MCG tablet TAKE (1) TABLET DAILY BE- FORE BREAKFAST.  Marland Kitchen LORazepam (ATIVAN) 0.5 MG tablet TAKE (1) TABLET THREE TIMES DAILY.  Marland Kitchen lovastatin (MEVACOR) 40 MG tablet TAKE 1 TABLET ONCE DAILY AT BEDTIME  . nystatin cream (MYCOSTATIN) Apply to affected area 2 times daily  . risperiDONE (RISPERDAL) 0.25 MG tablet TAKE 1 TABLET IN THE MORNING AND 2 TABLETS AT BEDTIME  . Calcium-Vitamin D-Vitamin K 500-100-40 MG-UNT-MCG CHEW Chew 1 tablet by mouth 2 (two) times daily.    No facility-administered encounter medications on file as of 05/09/2018.       New complaints: None today  Social history: Lives in a assistive living apartment.since her dad passed away. She fell back in 29-Mar-2023 and injured her knee but it is better now.  Review of Systems     Objective:     Physical Exam  Constitutional: She is oriented to person, place, and time. She appears well-developed and well-nourished. No distress.  HENT:  Head: Normocephalic.  Nose: Nose normal.  Mouth/Throat: Oropharynx is clear and moist.  Eyes: Pupils are equal, round, and reactive to light. EOM are normal.  Neck: Normal range of motion. Neck supple. No JVD present. Carotid bruit is not present.  Cardiovascular: Normal rate, regular rhythm, normal heart sounds and intact distal pulses.  Pulmonary/Chest: Effort normal and breath sounds normal. No respiratory distress. She has no wheezes. She has no rales. She exhibits no tenderness.  Abdominal: Soft. Normal appearance, normal aorta and bowel sounds are normal. She exhibits no distension, no abdominal bruit, no pulsatile midline mass and no mass. There is no splenomegaly or hepatomegaly. There is no tenderness.  Musculoskeletal: Normal range of motion. She exhibits no edema.  Lymphadenopathy:    She has no cervical adenopathy.  Neurological: She is alert and oriented to person, place, and time. She has normal reflexes.  Skin: Skin is warm and dry.  Erythematous moist rash bil mammory fold  Psychiatric: She has a normal mood and affect. Her behavior is normal. Judgment and thought content normal.  Nursing note and vitals reviewed.  BP 139/87   Pulse 66   Temp (!) 96.5 F (35.8 C) (Oral)   Ht '5\' 6"'  (1.676 m)   Wt 194 lb (88 kg)   BMI 31.31  kg/m         Assessment & Plan:  Renee Davidson comes in today with chief complaint of Medical Management of Chronic Issues   Diagnosis and orders addressed:  1. Gastroesophageal reflux disease, esophagitis presence not specified Avoid spicy foods Do not eat 2 hours prior to bedtime  - esomeprazole (NEXIUM) 40 MG capsule; Take 1 capsule (40 mg total) by mouth daily.  Dispense: 90 capsule; Refill: 0  2. Hypothyroidism, unspecified type - levothyroxine (SYNTHROID, LEVOTHROID) 75 MCG tablet; TAKE  (1) TABLET DAILY BE- FORE BREAKFAST.  Dispense: 90 tablet; Refill: 1 - Thyroid Panel With TSH  3. Primary insomnia Bedtime routine - risperiDONE (RISPERDAL) 0.25 MG tablet; TAKE 1 TABLET IN THE MORNING AND 2 TABLETS AT BEDTIME  Dispense: 270 tablet; Refill: 1  4. Hyperlipidemia with target LDL less than 100 Low fat diet - CMP14+EGFR - Lipid panel - lovastatin (MEVACOR) 40 MG tablet; TAKE 1 TABLET ONCE DAILY AT BEDTIME  Dispense: 90 tablet; Refill: 1  5. GAD (generalized anxiety disorder) Stress management - LORazepam (ATIVAN) 0.5 MG tablet; Take 1 tablet (0.5 mg total) by mouth every 8 (eight) hours as needed for anxiety.  Dispense: 90 tablet; Refill: 2  6. Obsessive-compulsive disorder, unspecified type - escitalopram (LEXAPRO) 20 MG tablet; Take 1 tablet (20 mg total) by mouth daily.  Dispense: 90 tablet; Refill: 1  7. Encephalopathy  8. Cutaneous candidiasis Keep area as dry as possible - nystatin cream (MYCOSTATIN); Apply to affected area 2 times daily  Dispense: 30 g; Refill: 5   Labs pending Health Maintenance reviewed Diet and exercise encouraged  Follow up plan: 6 months   [place], FNP

## 2018-05-10 ENCOUNTER — Telehealth: Payer: Self-pay | Admitting: Nurse Practitioner

## 2018-05-10 LAB — THYROID PANEL WITH TSH
Free Thyroxine Index: 2.3 (ref 1.2–4.9)
T3 Uptake Ratio: 28 % (ref 24–39)
T4, Total: 8.3 ug/dL (ref 4.5–12.0)
TSH: 0.282 u[IU]/mL — ABNORMAL LOW (ref 0.450–4.500)

## 2018-05-10 LAB — CMP14+EGFR
ALT: 13 IU/L (ref 0–32)
AST: 21 IU/L (ref 0–40)
Albumin/Globulin Ratio: 1.8 (ref 1.2–2.2)
Albumin: 4 g/dL (ref 3.6–4.8)
Alkaline Phosphatase: 125 IU/L — ABNORMAL HIGH (ref 39–117)
BUN/Creatinine Ratio: 9 — ABNORMAL LOW (ref 12–28)
BUN: 7 mg/dL — ABNORMAL LOW (ref 8–27)
Bilirubin Total: 0.2 mg/dL (ref 0.0–1.2)
CO2: 22 mmol/L (ref 20–29)
Calcium: 9 mg/dL (ref 8.7–10.3)
Chloride: 101 mmol/L (ref 96–106)
Creatinine, Ser: 0.77 mg/dL (ref 0.57–1.00)
GFR calc Af Amer: 91 mL/min/{1.73_m2} (ref >59)
GFR calc non Af Amer: 79 mL/min/{1.73_m2} (ref >59)
Globulin, Total: 2.2 g/dL (ref 1.5–4.5)
Glucose: 84 mg/dL (ref 65–99)
Potassium: 4.1 mmol/L (ref 3.5–5.2)
Sodium: 140 mmol/L (ref 134–144)
Total Protein: 6.2 g/dL (ref 6.0–8.5)

## 2018-05-10 LAB — LIPID PANEL
Chol/HDL Ratio: 3 ratio (ref 0.0–4.4)
Cholesterol, Total: 155 mg/dL (ref 100–199)
HDL: 52 mg/dL (ref >39)
LDL Calculated: 83 mg/dL (ref 0–99)
Triglycerides: 102 mg/dL (ref 0–149)
VLDL Cholesterol Cal: 20 mg/dL (ref 5–40)

## 2018-05-10 MED ORDER — LEVOTHYROXINE SODIUM 50 MCG PO TABS: 50.0000 ug | ORAL_TABLET | Freq: Every day | ORAL | 3 refills | Status: DC

## 2018-05-10 NOTE — Addendum Note (Signed)
Addended by: Chevis Pretty on: 05/10/2018 01:34 PM   Modules accepted: Orders

## 2018-05-11 ENCOUNTER — Other Ambulatory Visit: Payer: Self-pay

## 2018-05-11 DIAGNOSIS — E039 Hypothyroidism, unspecified: Secondary | ICD-10-CM

## 2018-05-11 NOTE — Telephone Encounter (Signed)
Sister aware of results per result note

## 2018-07-07 ENCOUNTER — Encounter: Payer: Self-pay | Admitting: Family Medicine

## 2018-07-07 ENCOUNTER — Ambulatory Visit (INDEPENDENT_AMBULATORY_CARE_PROVIDER_SITE_OTHER): Payer: Medicare Other | Admitting: Family Medicine

## 2018-07-07 VITALS — BP 131/77 | HR 83 | Temp 97.9°F | Ht 66.0 in | Wt 197.0 lb

## 2018-07-07 DIAGNOSIS — J069 Acute upper respiratory infection, unspecified: Secondary | ICD-10-CM

## 2018-07-07 DIAGNOSIS — E039 Hypothyroidism, unspecified: Secondary | ICD-10-CM

## 2018-07-07 MED ORDER — BENZONATATE 100 MG PO CAPS: 100.0000 mg | ORAL_CAPSULE | Freq: Three times a day (TID) | ORAL | 0 refills | Status: DC | PRN

## 2018-07-07 MED ORDER — AMOXICILLIN-POT CLAVULANATE 875-125 MG PO TABS: 1.0000 | ORAL_TABLET | Freq: Two times a day (BID) | ORAL | 0 refills | Status: AC

## 2018-07-07 NOTE — Patient Instructions (Signed)
Upper Respiratory Infection, Adult Most upper respiratory infections (URIs) are caused by a virus. A URI affects the nose, throat, and upper air passages. The most common type of URI is often called "the common cold." Follow these instructions at home:  Take medicines only as told by your doctor.  Gargle warm saltwater or take cough drops to comfort your throat as told by your doctor.  Use a warm mist humidifier or inhale steam from a shower to increase air moisture. This may make it easier to breathe.  Drink enough fluid to keep your pee (urine) clear or pale yellow.  Eat soups and other clear broths.  Have a healthy diet.  Rest as needed.  Go back to work when your fever is gone or your doctor says it is okay. ? You may need to stay home longer to avoid giving your URI to others. ? You can also wear a face mask and wash your hands often to prevent spread of the virus.  Use your inhaler more if you have asthma.  Do not use any tobacco products, including cigarettes, chewing tobacco, or electronic cigarettes. If you need help quitting, ask your doctor. Contact a doctor if:  You are getting worse, not better.  Your symptoms are not helped by medicine.  You have chills.  You are getting more short of breath.  You have brown or red mucus.  You have yellow or brown discharge from your nose.  You have pain in your face, especially when you bend forward.  You have a fever.  You have puffy (swollen) neck glands.  You have pain while swallowing.  You have white areas in the back of your throat. Get help right away if:  You have very bad or constant: ? Headache. ? Ear pain. ? Pain in your forehead, behind your eyes, and over your cheekbones (sinus pain). ? Chest pain.  You have long-lasting (chronic) lung disease and any of the following: ? Wheezing. ? Long-lasting cough. ? Coughing up blood. ? A change in your usual mucus.  You have a stiff neck.  You have  changes in your: ? Vision. ? Hearing. ? Thinking. ? Mood. This information is not intended to replace advice given to you by your health care provider. Make sure you discuss any questions you have with your health care provider. Document Released: 01/05/2008 Document Revised: 03/21/2016 Document Reviewed: 10/24/2013 Elsevier Interactive Patient Education  2018 Elsevier Inc.  

## 2018-07-07 NOTE — Progress Notes (Signed)
Subjective:    Patient ID: Renee Davidson, female    DOB: [birth year], 67 y.o.   MRN: [id number]  Chief Complaint:  Cough   HPI: Renee Davidson is a 67 y.o. female presenting on 07/07/2018 for Cough   1. Upper respiratory infection with cough and congestion   Pt presents today with complaints of cough and congestion. This is an ongoing problem. The current episode started 3 weeks ago. The problem occurs intermittently. The problem has been worse at night. Associated symptoms include sputum production, fever, chills, fatigue, and headache. Pt denies confusion, chest pain, shortness or breath, or palpitations. Pt has tried Mucinex for the symptoms. The treatment provided some relief.     Relevant past medical, surgical, family, and social history reviewed and updated as indicated.  Allergies and medications reviewed and updated.   Past Medical History:  Diagnosis Date  . Anxiety   . Depression   . GERD (gastroesophageal reflux disease)   . Hyperlipidemia   . Mental developmental delay   . OCD (obsessive compulsive disorder)   . Osteoporosis   . Thyroid disease     Past Surgical History:  Procedure Laterality Date  . TONSILLECTOMY      Social History   Socioeconomic History  . Marital status: Single    Spouse name: Not on file  . Number of children: 0  . Years of education: 6  . Highest education level: 12th grade  Occupational History  . Occupation: retired    Comment: Occupational psychologist  . Financial resource strain: Not hard at all  . Food insecurity:    Worry: Never true    Inability: Never true  . Transportation needs:    Medical: No    Non-medical: No  Tobacco Use  . Smoking status: Never Smoker  . Smokeless tobacco: Never Used  Substance and Sexual Activity  . Alcohol use: No  . Drug use: No  . Sexual activity: Never  Lifestyle  . Physical activity:    Days per week: 3 days    Minutes per session: 30 min  . Stress: Not at all    Relationships  . Social connections:    Talks on phone: More than three times a week    Gets together: More than three times a week    Attends religious service: More than 4 times per year    Active member of club or organization: Yes    Attends meetings of clubs or organizations: More than 4 times per year    Relationship status: Never married  . Intimate partner violence:    Fear of current or ex partner: No    Emotionally abused: No    Physically abused: No    Forced sexual activity: No  Other Topics Concern  . Not on file  Social History Narrative  . Not on file    Outpatient Encounter Medications as of 07/07/2018  Medication Sig  . aspirin EC 81 MG tablet Take 81 mg by mouth daily.  . Calcium-Vitamin D-Vitamin K 500-100-40 MG-UNT-MCG CHEW Chew 1 tablet by mouth 2 (two) times daily.   Marland Kitchen escitalopram (LEXAPRO) 20 MG tablet Take 1 tablet (20 mg total) by mouth daily.  Marland Kitchen esomeprazole (NEXIUM) 40 MG capsule Take 1 capsule (40 mg total) by mouth daily.  Marland Kitchen levothyroxine (SYNTHROID, LEVOTHROID) 50 MCG tablet Take 1 tablet (50 mcg total) by mouth daily.  Marland Kitchen LORazepam (ATIVAN) 0.5 MG tablet Take 1 tablet (0.5 mg total)  by mouth every 8 (eight) hours as needed for anxiety.  . lovastatin (MEVACOR) 40 MG tablet TAKE 1 TABLET ONCE DAILY AT BEDTIME  . nystatin cream (MYCOSTATIN) Apply to affected area 2 times daily  . risperiDONE (RISPERDAL) 0.25 MG tablet TAKE 1 TABLET IN THE MORNING AND 2 TABLETS AT BEDTIME  . amoxicillin-clavulanate (AUGMENTIN) 875-125 MG tablet Take 1 tablet by mouth 2 (two) times daily for 7 days.  . benzonatate (TESSALON PERLES) 100 MG capsule Take 1 capsule (100 mg total) by mouth 3 (three) times daily as needed for cough.   No facility-administered encounter medications on file as of 07/07/2018.     Allergies  Allergen Reactions  . Doxycycline Rash    Review of Systems  Constitutional: Positive for activity change, chills, fatigue and fever.  HENT: Positive  for congestion, postnasal drip, rhinorrhea and sore throat.   Respiratory: Positive for cough. Negative for chest tightness and shortness of breath.   Cardiovascular: Negative for chest pain, palpitations and leg swelling.  Gastrointestinal: Negative for abdominal pain, diarrhea, nausea and vomiting.  Musculoskeletal: Negative for myalgias.  Neurological: Positive for headaches. Negative for dizziness and weakness.  Psychiatric/Behavioral: Negative for confusion.  All other systems reviewed and are negative.       Objective:    BP 131/77   Pulse 83   Temp 97.9 F (36.6 C) (Oral)   Ht '5\' 6"'  (1.676 m)   Wt 197 lb (89.4 kg)   SpO2 96%   BMI 31.80 kg/m    Wt Readings from Last 3 Encounters:  07/07/18 197 lb (89.4 kg)  05/09/18 194 lb (88 kg)  04/19/18 186 lb (84.4 kg)    Physical Exam  Constitutional: She appears well-developed and well-nourished. She is cooperative. No distress.  HENT:  Head: Normocephalic and atraumatic.  Right Ear: Hearing, external ear and ear canal normal. Tympanic membrane is not perforated and not erythematous. A middle ear effusion is present.  Left Ear: Hearing, external ear and ear canal normal. Tympanic membrane is not perforated and not erythematous. A middle ear effusion is present.  Nose: Mucosal edema and rhinorrhea present.  Mouth/Throat: Uvula is midline and mucous membranes are normal. Posterior oropharyngeal erythema present. No oropharyngeal exudate, posterior oropharyngeal edema or tonsillar abscesses. No tonsillar exudate.  Eyes: Pupils are equal, round, and reactive to light. Conjunctivae and lids are normal.  Neck: Trachea normal and phonation normal. Neck supple. No thyroid mass and no thyromegaly present.  Cardiovascular: Normal rate, regular rhythm and normal heart sounds. Exam reveals no gallop and no friction rub.  No murmur heard. Pulmonary/Chest: Effort normal. She has rhonchi (mild) in the right upper field and the left upper  field.  Lymphadenopathy:    She has no cervical adenopathy.  Neurological: She is alert.  Skin: Skin is warm and dry. Capillary refill takes less than 2 seconds.  Psychiatric: She has a normal mood and affect. Her behavior is normal. Judgment and thought content normal.  Nursing note and vitals reviewed.   Results for orders placed or performed in visit on 05/09/18  CMP14+EGFR  Result Value Ref Range   Glucose 84 65 - 99 mg/dL   BUN 7 (L) 8 - 27 mg/dL   Creatinine, Ser 0.77 0.57 - 1.00 mg/dL   GFR calc non Af Amer 79 >59 mL/min/1.73   GFR calc Af Amer 91 >59 mL/min/1.73   BUN/Creatinine Ratio 9 (L) 12 - 28   Sodium 140 134 - 144 mmol/L   Potassium 4.1 3.5 -  5.2 mmol/L   Chloride 101 96 - 106 mmol/L   CO2 22 20 - 29 mmol/L   Calcium 9.0 8.7 - 10.3 mg/dL   Total Protein 6.2 6.0 - 8.5 g/dL   Albumin 4.0 3.6 - 4.8 g/dL   Globulin, Total 2.2 1.5 - 4.5 g/dL   Albumin/Globulin Ratio 1.8 1.2 - 2.2   Bilirubin Total 0.2 0.0 - 1.2 mg/dL   Alkaline Phosphatase 125 (H) 39 - 117 IU/L   AST 21 0 - 40 IU/L   ALT 13 0 - 32 IU/L  Lipid panel  Result Value Ref Range   Cholesterol, Total 155 100 - 199 mg/dL   Triglycerides 102 0 - 149 mg/dL   HDL 52 >39 mg/dL   VLDL Cholesterol Cal 20 5 - 40 mg/dL   LDL Calculated 83 0 - 99 mg/dL   Chol/HDL Ratio 3.0 0.0 - 4.4 ratio  Thyroid Panel With TSH  Result Value Ref Range   TSH 0.282 (L) 0.450 - 4.500 uIU/mL   T4, Total 8.3 4.5 - 12.0 ug/dL   T3 Uptake Ratio 28 24 - 39 %   Free Thyroxine Index 2.3 1.2 - 4.9       Pertinent labs & imaging results that were available during my care of the patient were reviewed by me and considered in my medical decision making.  Assessment & Plan:  Renee Davidson was seen today for cough.  Diagnoses and all orders for this visit:  Upper respiratory infection with cough and congestion Ongoing symptoms for 3 weeks with intermittent fever and chills. Will treat today with augmentin. Increase fluid intake. Continue  mucinex. Increase humidity in the air. -     amoxicillin-clavulanate (AUGMENTIN) 875-125 MG tablet; Take 1 tablet by mouth 2 (two) times daily for 7 days. -     benzonatate (TESSALON PERLES) 100 MG capsule; Take 1 capsule (100 mg total) by mouth 3 (three) times daily as needed for cough.    Continue all other maintenance medications.  Follow up plan: Return if symptoms worsen or fail to improve.  Educational handout given for URI  The above assessment and management plan was discussed with the patient. The patient verbalized understanding of and has agreed to the management plan. Patient is aware to call the clinic if symptoms persist or worsen. Patient is aware when to return to the clinic for a follow-up visit. Patient educated on when it is appropriate to go to the emergency department.   Monia Pouch, FNP-C [facility] [phone]

## 2018-07-08 LAB — THYROID PANEL WITH TSH
Free Thyroxine Index: 2.3 (ref 1.2–4.9)
T3 Uptake Ratio: 28 % (ref 24–39)
T4, Total: 8.3 ug/dL (ref 4.5–12.0)
TSH: 1.36 u[IU]/mL (ref 0.450–4.500)

## 2018-08-14 ENCOUNTER — Other Ambulatory Visit: Payer: Self-pay | Admitting: Nurse Practitioner

## 2018-08-14 DIAGNOSIS — F411 Generalized anxiety disorder: Secondary | ICD-10-CM

## 2018-08-14 NOTE — Telephone Encounter (Signed)
Last seen 07/27/18  Sharyn Lull

## 2018-08-15 DIAGNOSIS — H43813 Vitreous degeneration, bilateral: Secondary | ICD-10-CM | POA: Diagnosis not present

## 2018-08-15 DIAGNOSIS — H2513 Age-related nuclear cataract, bilateral: Secondary | ICD-10-CM | POA: Diagnosis not present

## 2018-08-15 DIAGNOSIS — B351 Tinea unguium: Secondary | ICD-10-CM | POA: Diagnosis not present

## 2018-08-15 DIAGNOSIS — H43393 Other vitreous opacities, bilateral: Secondary | ICD-10-CM | POA: Diagnosis not present

## 2018-08-15 DIAGNOSIS — M79676 Pain in unspecified toe(s): Secondary | ICD-10-CM | POA: Diagnosis not present

## 2018-08-15 DIAGNOSIS — H53021 Refractive amblyopia, right eye: Secondary | ICD-10-CM | POA: Diagnosis not present

## 2018-09-08 ENCOUNTER — Other Ambulatory Visit: Payer: Self-pay | Admitting: Nurse Practitioner

## 2018-09-08 DIAGNOSIS — F411 Generalized anxiety disorder: Secondary | ICD-10-CM

## 2018-09-27 ENCOUNTER — Other Ambulatory Visit: Payer: Self-pay | Admitting: Nurse Practitioner

## 2018-09-27 DIAGNOSIS — K219 Gastro-esophageal reflux disease without esophagitis: Secondary | ICD-10-CM

## 2018-10-09 ENCOUNTER — Other Ambulatory Visit: Payer: Self-pay | Admitting: Nurse Practitioner

## 2018-10-09 DIAGNOSIS — F411 Generalized anxiety disorder: Secondary | ICD-10-CM

## 2018-10-09 DIAGNOSIS — E785 Hyperlipidemia, unspecified: Secondary | ICD-10-CM

## 2018-11-07 ENCOUNTER — Other Ambulatory Visit: Payer: Self-pay | Admitting: Nurse Practitioner

## 2018-11-07 DIAGNOSIS — F411 Generalized anxiety disorder: Secondary | ICD-10-CM

## 2018-11-09 ENCOUNTER — Encounter: Payer: Self-pay | Admitting: Nurse Practitioner

## 2018-11-09 ENCOUNTER — Other Ambulatory Visit: Payer: Self-pay

## 2018-11-09 ENCOUNTER — Ambulatory Visit (INDEPENDENT_AMBULATORY_CARE_PROVIDER_SITE_OTHER): Payer: Medicare Other | Admitting: Nurse Practitioner

## 2018-11-09 DIAGNOSIS — E039 Hypothyroidism, unspecified: Secondary | ICD-10-CM

## 2018-11-09 DIAGNOSIS — G9340 Encephalopathy, unspecified: Secondary | ICD-10-CM

## 2018-11-09 DIAGNOSIS — F411 Generalized anxiety disorder: Secondary | ICD-10-CM

## 2018-11-09 DIAGNOSIS — B372 Candidiasis of skin and nail: Secondary | ICD-10-CM | POA: Diagnosis not present

## 2018-11-09 DIAGNOSIS — J069 Acute upper respiratory infection, unspecified: Secondary | ICD-10-CM

## 2018-11-09 DIAGNOSIS — F429 Obsessive-compulsive disorder, unspecified: Secondary | ICD-10-CM | POA: Diagnosis not present

## 2018-11-09 DIAGNOSIS — K219 Gastro-esophageal reflux disease without esophagitis: Secondary | ICD-10-CM | POA: Diagnosis not present

## 2018-11-09 DIAGNOSIS — M810 Age-related osteoporosis without current pathological fracture: Secondary | ICD-10-CM

## 2018-11-09 DIAGNOSIS — F5101 Primary insomnia: Secondary | ICD-10-CM

## 2018-11-09 DIAGNOSIS — E785 Hyperlipidemia, unspecified: Secondary | ICD-10-CM

## 2018-11-09 MED ORDER — NYSTATIN 100000 UNIT/GM EX CREA: TOPICAL_CREAM | CUTANEOUS | 5 refills | Status: DC

## 2018-11-09 MED ORDER — LOVASTATIN 40 MG PO TABS: 40.0000 mg | ORAL_TABLET | Freq: Every day | ORAL | 1 refills | Status: DC

## 2018-11-09 MED ORDER — LEVOTHYROXINE SODIUM 50 MCG PO TABS: 50.0000 ug | ORAL_TABLET | Freq: Every day | ORAL | 1 refills | Status: DC

## 2018-11-09 MED ORDER — LORAZEPAM 0.5 MG PO TABS: ORAL_TABLET | ORAL | 2 refills | Status: DC

## 2018-11-09 MED ORDER — ESOMEPRAZOLE MAGNESIUM 40 MG PO CPDR: DELAYED_RELEASE_CAPSULE | ORAL | 1 refills | Status: DC

## 2018-11-09 MED ORDER — RISPERIDONE 0.25 MG PO TABS: ORAL_TABLET | ORAL | 1 refills | Status: DC

## 2018-11-09 MED ORDER — ESCITALOPRAM OXALATE 20 MG PO TABS: 20.0000 mg | ORAL_TABLET | Freq: Every day | ORAL | 1 refills | Status: DC

## 2018-11-09 NOTE — Progress Notes (Signed)
Patient ID: Renee Davidson, female   DOB: 1950-12-11, 68 y.o.   MRN: [id number]    Virtual Visit via telephone Note  I connected with Garvin Fila Lottman on 11/09/18 at 10:15 AM by telephone and verified that I am speaking with the correct person using two identifiers. Garvin Fila Well is currently located at home and *her sister Amy  is currently with her during visit. The provider, Elizzie-Margaret Hassell Done, FNP is located in their office at time of visit.  I discussed the limitations, risks, security and privacy concerns of performing an evaluation and management service by telephone and the availability of in person appointments. I also discussed with the patient that there may be a patient responsible charge related to this service. The patient expressed understanding and agreed to proceed.   History and Present Illness:   Chief Complaint: Medical Management of Chronic Issues   HPI:  1. Hyperlipidemia with target LDL less than 100  She eats whatever people fix for her. She is not able to do much cooking on her own. Does occasional exercise.  2. Hypothyroidism, unspecified type  No problems that family is aware of.  3. Gastroesophageal reflux disease, esophagitis presence not specified  she takes nexium daily and it works well to keep symptoms under control  4. GAD (generalized anxiety disorder)  She is a very anxious persons and  Worries all day. She is given an ativan 3x a day. Helps keep her somewhat calm  5. Obsessive-compulsive disorder, unspecified type  She obsessive over many things. She really cannot handle change of any kind in her life.  6. Primary insomnia  She is given riperdol nightly to sleep and sleeps well most nights  7. Encephalopathy  She had this as a child and is developmentally delayed and unable to care for herself.  8. Osteoporosis, post-menopausal  Her last dexascan was 06/30/16 with t score of -2.5.    Outpatient Encounter Medications as of 11/09/2018  Medication Sig  .  aspirin EC 81 MG tablet Take 81 mg by mouth daily.  . benzonatate (TESSALON PERLES) 100 MG capsule Take 1 capsule (100 mg total) by mouth 3 (three) times daily as needed for cough.  . Calcium-Vitamin D-Vitamin K 500-100-40 MG-UNT-MCG CHEW Chew 1 tablet by mouth 2 (two) times daily.   Marland Kitchen escitalopram (LEXAPRO) 20 MG tablet Take 1 tablet (20 mg total) by mouth daily.  Marland Kitchen esomeprazole (NEXIUM) 40 MG capsule TAKE (1) CAPSULE DAILY  . levothyroxine (SYNTHROID, LEVOTHROID) 50 MCG tablet Take 1 tablet (50 mcg total) by mouth daily.  Marland Kitchen LORazepam (ATIVAN) 0.5 MG tablet TAKE 1 TABLET EVERY 8 HOURS AS NEEDED FOR ANXIETY  . lovastatin (MEVACOR) 40 MG tablet TAKE 1 TABLET ONCE DAILY AT BEDTIME  . nystatin cream (MYCOSTATIN) Apply to affected area 2 times daily  . risperiDONE (RISPERDAL) 0.25 MG tablet TAKE 1 TABLET IN THE MORNING AND 2 TABLETS AT BEDTIME       New complaints: Has had yeast rash under her breasts and need some nystatin cream.  Social history: She lives in an assistive living facility which gives her some independence.   Review of Systems  Constitutional: Negative for diaphoresis and weight loss.  Eyes: Negative for blurred vision, double vision and pain.  Respiratory: Negative for shortness of breath.   Cardiovascular: Negative for chest pain, palpitations, orthopnea and leg swelling.  Gastrointestinal: Negative for abdominal pain.  Skin: Negative for rash.  Neurological: Negative for dizziness, sensory change, loss of consciousness, weakness and  headaches.  Endo/Heme/Allergies: Negative for polydipsia. Does not bruise/bleed easily.  Psychiatric/Behavioral: Negative for memory loss. The patient does not have insomnia.   All other systems reviewed and are negative.      Observations/Objective: Alert- answers all questions approrpiately  Assessment and Plan: Garvin Fila Niziolek comes in today with chief complaint of Medical Management of Chronic Issues   Diagnosis and orders  addressed:  1. Hyperlipidemia with target LDL less than 100 Low fat diet - lovastatin (MEVACOR) 40 MG tablet; Take 1 tablet (40 mg total) by mouth at bedtime.  Dispense: 90 tablet; Refill: 1  2. Hypothyroidism, unspecified type - levothyroxine (SYNTHROID, LEVOTHROID) 50 MCG tablet; Take 1 tablet (50 mcg total) by mouth daily.  Dispense: 90 tablet; Refill: 1  3. Gastroesophageal reflux disease, esophagitis presence not specified Avoid spicy foods Do not eat 2 hours prior to bedtime - esomeprazole (NEXIUM) 40 MG capsule; TAKE (1) CAPSULE DAILY  Dispense: 90 capsule; Refill: 1  4. GAD (generalized anxiety disorder) Stress management - LORazepam (ATIVAN) 0.5 MG tablet; TAKE 1 TABLET EVERY 8 HOURS AS NEEDED FOR ANXIETY  Dispense: 90 tablet; Refill: 2  5. Obsessive-compulsive disorder, unspecified type - escitalopram (LEXAPRO) 20 MG tablet; Take 1 tablet (20 mg total) by mouth daily.  Dispense: 90 tablet; Refill: 1  6. Primary insomnia Bedtime routine - risperiDONE (RISPERDAL) 0.25 MG tablet; TAKE 1 TABLET IN THE MORNING AND 2 TABLETS AT BEDTIME  Dispense: 270 tablet; Refill: 1  7. Encephalopathy  8. Osteoporosis, post-menopausal Weight bearing exercises encouraged  9. Cutaneous candidiasis Keep area clean and dry - nystatin cream (MYCOSTATIN); Apply to affected area 2 times daily  Dispense: 30 g; Refill: 5  Previous labs reviewed Health Maintenance reviewed Diet and exercise encouraged  Follow up plan: 3 months     I discussed the assessment and treatment plan with the patient. The patient was provided an opportunity to ask questions and all were answered. The patient agreed with the plan and demonstrated an understanding of the instructions.   The patient was advised to call back or seek an in-person evaluation if the symptoms worsen or if the condition fails to improve as anticipated.  The above assessment and management plan was discussed with the patient. The patient  verbalized understanding of and has agreed to the management plan. Patient is aware to call the clinic if symptoms persist or worsen. Patient is aware when to return to the clinic for a follow-up visit. Patient educated on when it is appropriate to go to the emergency department.    I provided 12 minutes of non-face-to-face time during this encounter.    Kebra-Margaret Hassell Done, FNP

## 2018-11-30 ENCOUNTER — Other Ambulatory Visit: Payer: Self-pay | Admitting: Family Medicine

## 2018-11-30 DIAGNOSIS — J069 Acute upper respiratory infection, unspecified: Secondary | ICD-10-CM

## 2018-12-22 ENCOUNTER — Telehealth: Payer: Self-pay

## 2018-12-22 MED ORDER — PANTOPRAZOLE SODIUM 40 MG PO TBEC: 40.0000 mg | DELAYED_RELEASE_TABLET | Freq: Every day | ORAL | 0 refills | Status: DC

## 2018-12-22 NOTE — Telephone Encounter (Signed)
Go ahead and send pantoprazole 40 mg daily for a month and have her try this

## 2018-12-22 NOTE — Telephone Encounter (Signed)
Patient aware.

## 2018-12-22 NOTE — Telephone Encounter (Signed)
Patient's insurance company has denied payment for Esomeprazole.  Patient must try and fail two of the following - Omeprazole capsules, Pantoprazole tablets, or Lansoprazole capsules.  She has already tried and failed Omeprazole so must try one more.

## 2019-01-11 DIAGNOSIS — B351 Tinea unguium: Secondary | ICD-10-CM | POA: Diagnosis not present

## 2019-01-11 DIAGNOSIS — M79676 Pain in unspecified toe(s): Secondary | ICD-10-CM | POA: Diagnosis not present

## 2019-01-22 ENCOUNTER — Other Ambulatory Visit: Payer: Self-pay | Admitting: Family Medicine

## 2019-02-21 ENCOUNTER — Other Ambulatory Visit: Payer: Self-pay | Admitting: Nurse Practitioner

## 2019-02-21 DIAGNOSIS — F411 Generalized anxiety disorder: Secondary | ICD-10-CM

## 2019-03-05 ENCOUNTER — Ambulatory Visit (INDEPENDENT_AMBULATORY_CARE_PROVIDER_SITE_OTHER): Payer: Medicare Other | Admitting: Nurse Practitioner

## 2019-03-05 ENCOUNTER — Encounter: Payer: Self-pay | Admitting: Nurse Practitioner

## 2019-03-05 DIAGNOSIS — F411 Generalized anxiety disorder: Secondary | ICD-10-CM

## 2019-03-05 DIAGNOSIS — E785 Hyperlipidemia, unspecified: Secondary | ICD-10-CM

## 2019-03-05 DIAGNOSIS — F5101 Primary insomnia: Secondary | ICD-10-CM

## 2019-03-05 DIAGNOSIS — G9340 Encephalopathy, unspecified: Secondary | ICD-10-CM

## 2019-03-05 DIAGNOSIS — E039 Hypothyroidism, unspecified: Secondary | ICD-10-CM

## 2019-03-05 DIAGNOSIS — M810 Age-related osteoporosis without current pathological fracture: Secondary | ICD-10-CM | POA: Diagnosis not present

## 2019-03-05 DIAGNOSIS — K219 Gastro-esophageal reflux disease without esophagitis: Secondary | ICD-10-CM | POA: Diagnosis not present

## 2019-03-05 DIAGNOSIS — F429 Obsessive-compulsive disorder, unspecified: Secondary | ICD-10-CM

## 2019-03-05 MED ORDER — LEVOTHYROXINE SODIUM 50 MCG PO TABS: 50.0000 ug | ORAL_TABLET | Freq: Every day | ORAL | 1 refills | Status: DC

## 2019-03-05 MED ORDER — ESOMEPRAZOLE MAGNESIUM 40 MG PO CPDR: DELAYED_RELEASE_CAPSULE | ORAL | 1 refills | Status: DC

## 2019-03-05 MED ORDER — RISPERIDONE 0.25 MG PO TABS: ORAL_TABLET | ORAL | 1 refills | Status: DC

## 2019-03-05 MED ORDER — LORAZEPAM 0.5 MG PO TABS: ORAL_TABLET | ORAL | 2 refills | Status: DC

## 2019-03-05 MED ORDER — ESCITALOPRAM OXALATE 20 MG PO TABS: 20.0000 mg | ORAL_TABLET | Freq: Every day | ORAL | 1 refills | Status: DC

## 2019-03-05 MED ORDER — LOVASTATIN 40 MG PO TABS: 40.0000 mg | ORAL_TABLET | Freq: Every day | ORAL | 1 refills | Status: DC

## 2019-03-05 MED ORDER — PANTOPRAZOLE SODIUM 40 MG PO TBEC: 40.0000 mg | DELAYED_RELEASE_TABLET | Freq: Every day | ORAL | 1 refills | Status: DC

## 2019-03-05 NOTE — Progress Notes (Signed)
 Virtual Visit via telephone Note Due to COVID-19 pandemic this visit was conducted virtually. This visit type was conducted due to national recommendations for restrictions regarding the COVID-19 Pandemic (e.g. social distancing, sheltering in place) in an effort to limit this patient's exposure and mitigate transmission in our community. All issues noted in this document were discussed and addressed.  A physical exam was not performed with this format.  I connected with Renee Davidson on 03/05/19 at 1:35 by telephone and verified that I am speaking with the correct person using two identifiers. Renee Davidson is currently located at home and her sister is currently with her during visit. The provider, Sarinah-Margaret , FNP is located in their office at time of visit.  I discussed the limitations, risks, security and privacy concerns of performing an evaluation and management service by telephone and the availability of in person appointments. I also discussed with the patient that there may be a patient responsible charge related to this service. The patient expressed understanding and agreed to proceed.   History and Present Illness:   Chief Complaint: medical management of chronic issues  * because of her developmental delay the appointment was done with her sisiter  HPI:  1. Hyperlipidemia with target LDL less than 100 Her family tries to limit her diet but she gets one meal a day at the facility she lives in. She is able to warm up foods for herself and can make sandwiches and stuff.  2. Hypothyroidism, unspecified type No problems that aware of.  3. Gastroesophageal reflux disease, esophagitis presence not specified She takes nexium daily and works well for her  4. GAD (generalized anxiety disorder) Takes ativan 3x a day and that keeps her calm and makes it able to live alone in assistive living apartment  5. Primary insomnia She is on respiradol  nightly to sleep. Sister says  she does not complain of not sleeping well.  6. Encephalopathy This has caused her to be developmentally delayed and unable to completely take care of herself  7. Osteoporosis, post-menopausal Last dexascan was done 2017. Will repeat at next visit. denies any back paoin  8. Obsessive-compulsive disorder, unspecified type Her sister said she has always had issues with this but is able to deal with it.    Outpatient Encounter Medications as of 03/05/2019  Medication Sig  . aspirin EC 81 MG tablet Take 81 mg by mouth daily.  . benzonatate (TESSALON PERLES) 100 MG capsule Take 1 capsule (100 mg total) by mouth 3 (three) times daily as needed for cough.  . Calcium-Vitamin D-Vitamin K 500-100-40 MG-UNT-MCG CHEW Chew 1 tablet by mouth 2 (two) times daily.   . escitalopram (LEXAPRO) 20 MG tablet Take 1 tablet (20 mg total) by mouth daily.  . esomeprazole (NEXIUM) 40 MG capsule TAKE (1) CAPSULE DAILY  . levothyroxine (SYNTHROID, LEVOTHROID) 50 MCG tablet Take 1 tablet (50 mcg total) by mouth daily.  . LORazepam (ATIVAN) 0.5 MG tablet TAKE 1 TABLET EVERY 8 HOURS AS NEEDED FOR ANXIETY  . lovastatin (MEVACOR) 40 MG tablet Take 1 tablet (40 mg total) by mouth at bedtime.  . nystatin cream (MYCOSTATIN) Apply to affected area 2 times daily  . pantoprazole (PROTONIX) 40 MG tablet TAKE 1 TABLET DAILY  . risperiDONE (RISPERDAL) 0.25 MG tablet TAKE 1 TABLET IN THE MORNING AND 2 TABLETS AT BEDTIME     Past Surgical History:  Procedure Laterality Date  . TONSILLECTOMY      Family History    Problem Relation Age of Onset  . Diabetes Father   . Hyperlipidemia Father   . Hypertension Father   . Atrial fibrillation Father   . Hyperlipidemia Sister   . Dysphagia Sister   . GI problems Mother   . Heart disease Maternal Grandfather   . Colon cancer Maternal Grandfather     New complaints: No complaints  Social history: Lives in an apartment in an assistive living facility. They feed her one time a  day and check on her in morning and evening.  Controlled substance contract: will have signed at next face to face visit.    Review of Systems  Constitutional: Negative for diaphoresis and weight loss.  Eyes: Negative for blurred vision, double vision and pain.  Respiratory: Negative for shortness of breath.   Cardiovascular: Negative for chest pain, palpitations, orthopnea and leg swelling.  Gastrointestinal: Negative for abdominal pain.  Skin: Negative for rash.  Neurological: Negative for dizziness, sensory change, loss of consciousness, weakness and headaches.  Endo/Heme/Allergies: Negative for polydipsia. Does not bruise/bleed easily.  Psychiatric/Behavioral: Negative for memory loss. The patient does not have insomnia.   All other systems reviewed and are negative.    Observations/Objective: Did not get to speak with patient due to her developemental delay  Assessment and Plan: [name] comes in today with chief complaint of medical management of chronic issues  Diagnosis and orders addressed:  1. Hyperlipidemia with target LDL less than 100 Low fat diet - CMP14+EGFR; Future - Lipid panel; Future - lovastatin (MEVACOR) 40 MG tablet; Take 1 tablet (40 mg total) by mouth at bedtime.  Dispense: 90 tablet; Refill: 1  2. Hypothyroidism, unspecified type - Thyroid Panel With TSH; Future - levothyroxine (SYNTHROID) 50 MCG tablet; Take 1 tablet (50 mcg total) by mouth daily.  Dispense: 90 tablet; Refill: 1  3. Gastroesophageal reflux disease, esophagitis presence not specified Avoid spicy foods Do not eat 2 hours prior to bedtime - esomeprazole (NEXIUM) 40 MG capsule; TAKE (1) CAPSULE DAILY  Dispense: 90 capsule; Refill: 1  4. GAD (generalized anxiety disorder) - LORazepam (ATIVAN) 0.5 MG tablet; TAKE 1 TABLET EVERY 8 HOURS AS NEEDED FOR ANXIETY  Dispense: 90 tablet; Refill: 2  5. Primary insomnia Bedtime routine - risperiDONE (RISPERDAL) 0.25 MG tablet; TAKE 1  TABLET IN THE MORNING AND 2 TABLETS AT BEDTIME  Dispense: 270 tablet; Refill: 1  6. Encephalopathy  7. Osteoporosis, post-menopausal Weight bearing exercises when possible  8. Obsessive-compulsive disorder, unspecified type - escitalopram (LEXAPRO) 20 MG tablet; Take 1 tablet (20 mg total) by mouth daily.  Dispense: 90 tablet; Refill: 1   Family will bring patient in for lab work Health Maintenance reviewed Diet and exercise encouraged  Follow up plan: 6 months      I discussed the assessment and treatment plan with the patients sister. The patient was provided an opportunity to ask questions and all were answered. The patient agreed with the plan and demonstrated an understanding of the instructions.   The patient was advised to call back or seek an in-person evaluation if the symptoms worsen or if the condition fails to improve as anticipated.  The above assessment and management plan was discussed with the patient. The patient verbalized understanding of and has agreed to the management plan. Patient is aware to call the clinic if symptoms persist or worsen. Patient is aware when to return to the clinic for a follow-up visit. Patient educated on when it is appropriate to go to the emergency  department.   Time call ended:  1:50  I provided 15 minutes of non-face-to-face time during this encounter.    Aime-Margaret , FNP   

## 2019-03-26 ENCOUNTER — Other Ambulatory Visit: Payer: Self-pay

## 2019-03-26 ENCOUNTER — Other Ambulatory Visit: Payer: Medicare Other

## 2019-03-26 DIAGNOSIS — E785 Hyperlipidemia, unspecified: Secondary | ICD-10-CM | POA: Diagnosis not present

## 2019-03-26 DIAGNOSIS — E039 Hypothyroidism, unspecified: Secondary | ICD-10-CM

## 2019-03-27 LAB — SPECIMEN STATUS

## 2019-03-27 LAB — LIPID PANEL
Chol/HDL Ratio: 3 ratio (ref 0.0–4.4)
Cholesterol, Total: 146 mg/dL (ref 100–199)
HDL: 48 mg/dL (ref >39)
LDL Calculated: 80 mg/dL (ref 0–99)
Triglycerides: 90 mg/dL (ref 0–149)
VLDL Cholesterol Cal: 18 mg/dL (ref 5–40)

## 2019-03-27 LAB — CMP14+EGFR
ALT: 14 IU/L (ref 0–32)
AST: 20 IU/L (ref 0–40)
Albumin/Globulin Ratio: 1.6 (ref 1.2–2.2)
Albumin: 4 g/dL (ref 3.8–4.8)
Alkaline Phosphatase: 147 IU/L — ABNORMAL HIGH (ref 39–117)
BUN/Creatinine Ratio: 11 — ABNORMAL LOW (ref 12–28)
BUN: 9 mg/dL (ref 8–27)
Bilirubin Total: 0.3 mg/dL (ref 0.0–1.2)
CO2: 23 mmol/L (ref 20–29)
Calcium: 9.2 mg/dL (ref 8.7–10.3)
Chloride: 106 mmol/L (ref 96–106)
Creatinine, Ser: 0.85 mg/dL (ref 0.57–1.00)
GFR calc Af Amer: 80 mL/min/{1.73_m2} (ref >59)
GFR calc non Af Amer: 70 mL/min/{1.73_m2} (ref >59)
Globulin, Total: 2.5 g/dL (ref 1.5–4.5)
Glucose: 88 mg/dL (ref 65–99)
Potassium: 3.9 mmol/L (ref 3.5–5.2)
Sodium: 143 mmol/L (ref 134–144)
Total Protein: 6.5 g/dL (ref 6.0–8.5)

## 2019-03-27 LAB — THYROID PANEL WITH TSH
Free Thyroxine Index: 2.9 (ref 1.2–4.9)
T3 Uptake Ratio: 31 % (ref 24–39)
T4, Total: 9.4 ug/dL (ref 4.5–12.0)
TSH: 2.13 u[IU]/mL (ref 0.450–4.500)

## 2019-05-01 DIAGNOSIS — Z23 Encounter for immunization: Secondary | ICD-10-CM | POA: Diagnosis not present

## 2019-05-30 ENCOUNTER — Other Ambulatory Visit: Payer: Self-pay | Admitting: Nurse Practitioner

## 2019-05-30 DIAGNOSIS — F411 Generalized anxiety disorder: Secondary | ICD-10-CM

## 2019-06-19 ENCOUNTER — Other Ambulatory Visit: Payer: Self-pay | Admitting: Nurse Practitioner

## 2019-06-19 DIAGNOSIS — F411 Generalized anxiety disorder: Secondary | ICD-10-CM

## 2019-06-21 DIAGNOSIS — B351 Tinea unguium: Secondary | ICD-10-CM | POA: Diagnosis not present

## 2019-06-21 DIAGNOSIS — M79676 Pain in unspecified toe(s): Secondary | ICD-10-CM | POA: Diagnosis not present

## 2019-06-21 MED ORDER — LORAZEPAM 0.5 MG PO TABS: 0.5000 mg | ORAL_TABLET | Freq: Three times a day (TID) | ORAL | 0 refills | Status: DC

## 2019-07-17 ENCOUNTER — Telehealth: Payer: Self-pay | Admitting: Family Medicine

## 2019-07-17 ENCOUNTER — Other Ambulatory Visit: Payer: Self-pay | Admitting: Family Medicine

## 2019-07-17 ENCOUNTER — Other Ambulatory Visit: Payer: Self-pay | Admitting: Nurse Practitioner

## 2019-07-17 DIAGNOSIS — F411 Generalized anxiety disorder: Secondary | ICD-10-CM

## 2019-07-17 NOTE — Telephone Encounter (Signed)
Patient has follow up scheduled with you for February you scheduled she did not get enough refills of the lorazepam at [facility]. Please advise

## 2019-07-18 ENCOUNTER — Telehealth: Payer: Self-pay | Admitting: Nurse Practitioner

## 2019-07-18 NOTE — Telephone Encounter (Signed)
Patietn aware MMM is off and we are waiting foe her to respond to message from 12/15

## 2019-07-19 MED ORDER — LORAZEPAM 0.5 MG PO TABS: 0.5000 mg | ORAL_TABLET | Freq: Three times a day (TID) | ORAL | 0 refills | Status: DC

## 2019-09-06 ENCOUNTER — Ambulatory Visit (INDEPENDENT_AMBULATORY_CARE_PROVIDER_SITE_OTHER): Payer: Medicare Other | Admitting: Nurse Practitioner

## 2019-09-06 ENCOUNTER — Other Ambulatory Visit: Payer: Self-pay | Admitting: Nurse Practitioner

## 2019-09-06 ENCOUNTER — Encounter: Payer: Self-pay | Admitting: Nurse Practitioner

## 2019-09-06 DIAGNOSIS — F429 Obsessive-compulsive disorder, unspecified: Secondary | ICD-10-CM | POA: Diagnosis not present

## 2019-09-06 DIAGNOSIS — F411 Generalized anxiety disorder: Secondary | ICD-10-CM

## 2019-09-06 DIAGNOSIS — F5101 Primary insomnia: Secondary | ICD-10-CM

## 2019-09-06 DIAGNOSIS — Z23 Encounter for immunization: Secondary | ICD-10-CM | POA: Diagnosis not present

## 2019-09-06 DIAGNOSIS — M810 Age-related osteoporosis without current pathological fracture: Secondary | ICD-10-CM | POA: Diagnosis not present

## 2019-09-06 DIAGNOSIS — E039 Hypothyroidism, unspecified: Secondary | ICD-10-CM

## 2019-09-06 DIAGNOSIS — E785 Hyperlipidemia, unspecified: Secondary | ICD-10-CM

## 2019-09-06 DIAGNOSIS — G9340 Encephalopathy, unspecified: Secondary | ICD-10-CM

## 2019-09-06 DIAGNOSIS — K219 Gastro-esophageal reflux disease without esophagitis: Secondary | ICD-10-CM | POA: Diagnosis not present

## 2019-09-06 MED ORDER — LORAZEPAM 0.5 MG PO TABS: 0.5000 mg | ORAL_TABLET | Freq: Three times a day (TID) | ORAL | 2 refills | Status: DC

## 2019-09-06 MED ORDER — LOVASTATIN 40 MG PO TABS: 40.0000 mg | ORAL_TABLET | Freq: Every day | ORAL | 1 refills | Status: DC

## 2019-09-06 MED ORDER — ESCITALOPRAM OXALATE 20 MG PO TABS: 20.0000 mg | ORAL_TABLET | Freq: Every day | ORAL | 1 refills | Status: DC

## 2019-09-06 MED ORDER — LEVOTHYROXINE SODIUM 50 MCG PO TABS: 50.0000 ug | ORAL_TABLET | Freq: Every day | ORAL | 1 refills | Status: DC

## 2019-09-06 MED ORDER — PANTOPRAZOLE SODIUM 40 MG PO TBEC: 40.0000 mg | DELAYED_RELEASE_TABLET | Freq: Every day | ORAL | 1 refills | Status: DC

## 2019-09-06 MED ORDER — ESOMEPRAZOLE MAGNESIUM 40 MG PO CPDR: DELAYED_RELEASE_CAPSULE | ORAL | 1 refills | Status: DC

## 2019-09-06 MED ORDER — RISPERIDONE 0.25 MG PO TABS: ORAL_TABLET | ORAL | 1 refills | Status: DC

## 2019-09-06 NOTE — Progress Notes (Signed)
Virtual Visit via telephone Note Due to COVID-19 pandemic this visit was conducted virtually. This visit type was conducted due to national recommendations for restrictions regarding the COVID-19 Pandemic (e.g. social distancing, sheltering in place) in an effort to limit this patient's exposure and mitigate transmission in our community. All issues noted in this document were discussed and addressed.  A physical exam was not performed with this format.  I connected with Renee Davidson on 09/06/19 at 1:55 by telephone and verified that I am speaking with the correct person using two identifiers. Renee Fila Oguin is currently located at home and her sister is currently with her during visit. The provider, Camary-Margaret Hassell Done, FNP is located in their office at time of visit.  I discussed the limitations, risks, security and privacy concerns of performing an evaluation and management service by telephone and the availability of in person appointments. I also discussed with the patient that there may be a patient responsible charge related to this service. The patient expressed understanding and agreed to proceed.   History and Present Illness:   Chief Complaint: Medical Management of Chronic Issues    HPI: * most info obtained from patients sister- due to patients mental handicap.  1. Hyperlipidemia with target LDL less than 100 Eats what ever her retirement home gives her. She does stay active. Lab Results  Component Value Date   CHOL 146 03/26/2019   HDL 48 03/26/2019   LDLCALC 80 03/26/2019   TRIG 90 03/26/2019   CHOLHDL 3.0 03/26/2019     2. Gastroesophageal reflux disease, unspecified whether esophagitis present Is on nexium daily and is doing well.  3. Hypothyroidism, unspecified type No complaints  4. GAD (generalized anxiety disorder) Stays anxious due to her mental disability/ takes ativan 3x a day. If she does not take she gets very anxious.  5. Obsessive-compulsive  disorder, unspecified type Takes lexapro and that helps her some.  6. Primary insomnia Sleeps late dring day and tehn doe snot go to bed at descent time.  7. Osteoporosis, post-menopausal Encouraged weight earing exercise  8. Encephalopathy Has developmental delay from this. Lives in a group home.    Outpatient Encounter Medications as of 09/06/2019  Medication Sig  . aspirin EC 81 MG tablet Take 81 mg by mouth daily.  . benzonatate (TESSALON PERLES) 100 MG capsule Take 1 capsule (100 mg total) by mouth 3 (three) times daily as needed for cough.  . Calcium-Vitamin D-Vitamin K 500-100-40 MG-UNT-MCG CHEW Chew 1 tablet by mouth 2 (two) times daily.   Marland Kitchen escitalopram (LEXAPRO) 20 MG tablet Take 1 tablet (20 mg total) by mouth daily.  Marland Kitchen esomeprazole (NEXIUM) 40 MG capsule TAKE (1) CAPSULE DAILY  . levothyroxine (SYNTHROID) 50 MCG tablet Take 1 tablet (50 mcg total) by mouth daily.  Marland Kitchen LORazepam (ATIVAN) 0.5 MG tablet Take 1 tablet (0.5 mg total) by mouth 3 (three) times daily.  Marland Kitchen lovastatin (MEVACOR) 40 MG tablet Take 1 tablet (40 mg total) by mouth at bedtime.  Marland Kitchen nystatin cream (MYCOSTATIN) Apply to affected area 2 times daily  . pantoprazole (PROTONIX) 40 MG tablet Take 1 tablet (40 mg total) by mouth daily.  . risperiDONE (RISPERDAL) 0.25 MG tablet TAKE 1 TABLET IN THE MORNING AND 2 TABLETS AT BEDTIME     Past Surgical History:  Procedure Laterality Date  . TONSILLECTOMY      Family History  Problem Relation Age of Onset  . Diabetes Father   . Hyperlipidemia Father   .  Hypertension Father   . Atrial fibrillation Father   . Hyperlipidemia Sister   . Dysphagia Sister   . GI problems Mother   . Heart disease Maternal Grandfather   . Colon cancer Maternal Grandfather     New complaints: None today  Social history: Lives in group home  Controlled substance contract: will have new one signed at next viist    Review of Systems  Constitutional: Negative for diaphoresis  and weight loss.  Eyes: Negative for blurred vision, double vision and pain.  Respiratory: Negative for shortness of breath.   Cardiovascular: Negative for chest pain, palpitations, orthopnea and leg swelling.  Gastrointestinal: Negative for abdominal pain.  Skin: Negative for rash.  Neurological: Negative for dizziness, sensory change, loss of consciousness, weakness and headaches.  Endo/Heme/Allergies: Negative for polydipsia. Does not bruise/bleed easily.  Psychiatric/Behavioral: Negative for memory loss. The patient does not have insomnia.   All other systems reviewed and are negative.    Observations/Objective: Alert and oriented- answers all questions appropriately No distress    Assessment and Plan: Renee Davidson in today with chief complaint of Medical Management of Chronic Issues   1. Hyperlipidemia with target LDL less than 100 Low fat diet - lovastatin (MEVACOR) 40 MG tablet; Take 1 tablet (40 mg total) by mouth at bedtime.  Dispense: 90 tablet; Refill: 1  2. Gastroesophageal reflux disease, unspecified whether esophagitis present Avoid spicy foods Do not eat 2 hours prior to bedtime - pantoprazole (PROTONIX) 40 MG tablet; Take 1 tablet (40 mg total) by mouth daily.  Dispense: 90 tablet; Refill: 1  3. Hypothyroidism, unspecified type - levothyroxine (SYNTHROID) 50 MCG tablet; Take 1 tablet (50 mcg total) by mouth daily.  Dispense: 90 tablet; Refill: 1  4. GAD (generalized anxiety disorder) Stress management - LORazepam (ATIVAN) 0.5 MG tablet; Take 1 tablet (0.5 mg total) by mouth 3 (three) times daily.  Dispense: 90 tablet; Refill: 2  5. Obsessive-compulsive disorder, unspecified type - escitalopram (LEXAPRO) 20 MG tablet; Take 1 tablet (20 mg total) by mouth daily.  Dispense: 90 tablet; Refill: 1  6. Primary insomnia Bedtime routiune - risperiDONE (RISPERDAL) 0.25 MG tablet; TAKE 1 TABLET IN THE MORNING AND 2 TABLETS AT BEDTIME  Dispense: 270 tablet; Refill:  1  7. Osteoporosis, post-menopausal Will do dexscan at next meeting  8. Encephalopathy  9. Gastroesophageal reflux disease Avoid spicy foods Do not eat 2 hours prior to bedtime - esomeprazole (NEXIUM) 40 MG capsule; TAKE (1) CAPSULE DAILY  Dispense: 90 capsule; Refill: 1    Follow Up Instructions: 3 months    I discussed the assessment and treatment plan with the patient. The patient was provided an opportunity to ask questions and all were answered. The patient agreed with the plan and demonstrated an understanding of the instructions.   The patient was advised to call back or seek an in-person evaluation if the symptoms worsen or if the condition fails to improve as anticipated.  The above assessment and management plan was discussed with the patient. The patient verbalized understanding of and has agreed to the management plan. Patient is aware to call the clinic if symptoms persist or worsen. Patient is aware when to return to the clinic for a follow-up visit. Patient educated on when it is appropriate to go to the emergency department.   Time call ended:  2:10 I provided 15 minutes of non-face-to-face time during this encounter.    Sharol-Margaret Hassell Done, FNP

## 2019-10-04 DIAGNOSIS — Z23 Encounter for immunization: Secondary | ICD-10-CM | POA: Diagnosis not present

## 2019-10-10 ENCOUNTER — Telehealth: Payer: Self-pay | Admitting: Nurse Practitioner

## 2019-10-10 NOTE — Telephone Encounter (Signed)
Spoke with patients sister that she doesn't have to do anything prior to virtual visit with Ronnald Collum, [place].

## 2019-10-31 ENCOUNTER — Emergency Department (HOSPITAL_BASED_OUTPATIENT_CLINIC_OR_DEPARTMENT_OTHER): Payer: Medicare Other

## 2019-10-31 ENCOUNTER — Encounter (HOSPITAL_BASED_OUTPATIENT_CLINIC_OR_DEPARTMENT_OTHER): Payer: Self-pay | Admitting: Emergency Medicine

## 2019-10-31 ENCOUNTER — Inpatient Hospital Stay (HOSPITAL_BASED_OUTPATIENT_CLINIC_OR_DEPARTMENT_OTHER)
Admission: EM | Admit: 2019-10-31 | Discharge: 2019-11-02 | DRG: 377 | Disposition: A | Discharge status: Home / Self Care | Payer: Medicare Other | Attending: Internal Medicine | Admitting: Internal Medicine

## 2019-10-31 ENCOUNTER — Other Ambulatory Visit: Payer: Self-pay

## 2019-10-31 DIAGNOSIS — M810 Age-related osteoporosis without current pathological fracture: Secondary | ICD-10-CM | POA: Diagnosis present

## 2019-10-31 DIAGNOSIS — Z8371 Family history of colonic polyps: Secondary | ICD-10-CM

## 2019-10-31 DIAGNOSIS — Z79890 Hormone replacement therapy (postmenopausal): Secondary | ICD-10-CM | POA: Diagnosis not present

## 2019-10-31 DIAGNOSIS — F429 Obsessive-compulsive disorder, unspecified: Secondary | ICD-10-CM | POA: Diagnosis present

## 2019-10-31 DIAGNOSIS — K633 Ulcer of intestine: Secondary | ICD-10-CM | POA: Diagnosis present

## 2019-10-31 DIAGNOSIS — R195 Other fecal abnormalities: Secondary | ICD-10-CM

## 2019-10-31 DIAGNOSIS — F329 Major depressive disorder, single episode, unspecified: Secondary | ICD-10-CM | POA: Diagnosis present

## 2019-10-31 DIAGNOSIS — K219 Gastro-esophageal reflux disease without esophagitis: Secondary | ICD-10-CM | POA: Diagnosis present

## 2019-10-31 DIAGNOSIS — E039 Hypothyroidism, unspecified: Secondary | ICD-10-CM | POA: Diagnosis not present

## 2019-10-31 DIAGNOSIS — K259 Gastric ulcer, unspecified as acute or chronic, without hemorrhage or perforation: Secondary | ICD-10-CM | POA: Diagnosis not present

## 2019-10-31 DIAGNOSIS — Z7189 Other specified counseling: Secondary | ICD-10-CM | POA: Diagnosis not present

## 2019-10-31 DIAGNOSIS — Z83438 Family history of other disorder of lipoprotein metabolism and other lipidemia: Secondary | ICD-10-CM

## 2019-10-31 DIAGNOSIS — K5730 Diverticulosis of large intestine without perforation or abscess without bleeding: Secondary | ICD-10-CM | POA: Diagnosis present

## 2019-10-31 DIAGNOSIS — K5790 Diverticulosis of intestine, part unspecified, without perforation or abscess without bleeding: Secondary | ICD-10-CM | POA: Diagnosis present

## 2019-10-31 DIAGNOSIS — G9341 Metabolic encephalopathy: Secondary | ICD-10-CM | POA: Diagnosis present

## 2019-10-31 DIAGNOSIS — E785 Hyperlipidemia, unspecified: Secondary | ICD-10-CM | POA: Diagnosis present

## 2019-10-31 DIAGNOSIS — D500 Iron deficiency anemia secondary to blood loss (chronic): Secondary | ICD-10-CM | POA: Diagnosis not present

## 2019-10-31 DIAGNOSIS — I10 Essential (primary) hypertension: Secondary | ICD-10-CM | POA: Diagnosis not present

## 2019-10-31 DIAGNOSIS — Z7982 Long term (current) use of aspirin: Secondary | ICD-10-CM | POA: Diagnosis not present

## 2019-10-31 DIAGNOSIS — K449 Diaphragmatic hernia without obstruction or gangrene: Secondary | ICD-10-CM | POA: Diagnosis not present

## 2019-10-31 DIAGNOSIS — Z6830 Body mass index (BMI) 30.0-30.9, adult: Secondary | ICD-10-CM | POA: Diagnosis not present

## 2019-10-31 DIAGNOSIS — R4182 Altered mental status, unspecified: Secondary | ICD-10-CM | POA: Diagnosis not present

## 2019-10-31 DIAGNOSIS — Z79899 Other long term (current) drug therapy: Secondary | ICD-10-CM | POA: Diagnosis not present

## 2019-10-31 DIAGNOSIS — K6289 Other specified diseases of anus and rectum: Secondary | ICD-10-CM | POA: Diagnosis not present

## 2019-10-31 DIAGNOSIS — R6250 Unspecified lack of expected normal physiological development in childhood: Secondary | ICD-10-CM | POA: Diagnosis present

## 2019-10-31 DIAGNOSIS — S0990XA Unspecified injury of head, initial encounter: Secondary | ICD-10-CM | POA: Diagnosis not present

## 2019-10-31 DIAGNOSIS — E038 Other specified hypothyroidism: Secondary | ICD-10-CM | POA: Diagnosis not present

## 2019-10-31 DIAGNOSIS — K922 Gastrointestinal hemorrhage, unspecified: Secondary | ICD-10-CM | POA: Diagnosis not present

## 2019-10-31 DIAGNOSIS — D509 Iron deficiency anemia, unspecified: Secondary | ICD-10-CM | POA: Diagnosis present

## 2019-10-31 DIAGNOSIS — F5101 Primary insomnia: Secondary | ICD-10-CM | POA: Diagnosis present

## 2019-10-31 DIAGNOSIS — Z888 Allergy status to other drugs, medicaments and biological substances status: Secondary | ICD-10-CM

## 2019-10-31 DIAGNOSIS — F428 Other obsessive-compulsive disorder: Secondary | ICD-10-CM | POA: Diagnosis not present

## 2019-10-31 DIAGNOSIS — K254 Chronic or unspecified gastric ulcer with hemorrhage: Principal | ICD-10-CM | POA: Diagnosis present

## 2019-10-31 DIAGNOSIS — K5190 Ulcerative colitis, unspecified, without complications: Secondary | ICD-10-CM | POA: Diagnosis not present

## 2019-10-31 DIAGNOSIS — Z20822 Contact with and (suspected) exposure to covid-19: Secondary | ICD-10-CM | POA: Diagnosis present

## 2019-10-31 DIAGNOSIS — E669 Obesity, unspecified: Secondary | ICD-10-CM | POA: Diagnosis present

## 2019-10-31 DIAGNOSIS — K2100 Gastro-esophageal reflux disease with esophagitis, without bleeding: Secondary | ICD-10-CM | POA: Diagnosis not present

## 2019-10-31 DIAGNOSIS — Z800 Family history of malignant neoplasm of digestive organs: Secondary | ICD-10-CM | POA: Diagnosis not present

## 2019-10-31 DIAGNOSIS — F411 Generalized anxiety disorder: Secondary | ICD-10-CM | POA: Diagnosis present

## 2019-10-31 DIAGNOSIS — D649 Anemia, unspecified: Secondary | ICD-10-CM | POA: Diagnosis not present

## 2019-10-31 DIAGNOSIS — K253 Acute gastric ulcer without hemorrhage or perforation: Secondary | ICD-10-CM | POA: Diagnosis not present

## 2019-10-31 DIAGNOSIS — F819 Developmental disorder of scholastic skills, unspecified: Secondary | ICD-10-CM | POA: Diagnosis present

## 2019-10-31 LAB — CBC WITH DIFFERENTIAL/PLATELET
Abs Immature Granulocytes: 0.03 10*3/uL (ref 0.00–0.07)
Abs Immature Granulocytes: 0.02 10*3/uL (ref 0.00–0.07)
Basophils Absolute: 0 10*3/uL (ref 0.0–0.1)
Basophils Absolute: 0 10*3/uL (ref 0.0–0.1)
Basophils Relative: 0 %
Basophils Relative: 1 %
Eosinophils Absolute: 0.1 10*3/uL (ref 0.0–0.5)
Eosinophils Absolute: 0.1 10*3/uL (ref 0.0–0.5)
Eosinophils Relative: 2 %
Eosinophils Relative: 2 %
HCT: 20.5 % — ABNORMAL LOW (ref 36.0–46.0)
HCT: 19.2 % — ABNORMAL LOW (ref 36.0–46.0)
Hemoglobin: 5.7 g/dL — CL (ref 12.0–15.0)
Hemoglobin: 5.2 g/dL — CL (ref 12.0–15.0)
Immature Granulocytes: 1 %
Immature Granulocytes: 0 %
Lymphocytes Relative: 22 %
Lymphocytes Relative: 19 %
Lymphs Abs: 1.2 10*3/uL (ref 0.7–4.0)
Lymphs Abs: 1.1 10*3/uL (ref 0.7–4.0)
MCH: 18.9 pg — ABNORMAL LOW (ref 26.0–34.0)
MCH: 18.6 pg — ABNORMAL LOW (ref 26.0–34.0)
MCHC: 27.8 g/dL — ABNORMAL LOW (ref 30.0–36.0)
MCHC: 27.1 g/dL — ABNORMAL LOW (ref 30.0–36.0)
MCV: 68.1 fL — ABNORMAL LOW (ref 80.0–100.0)
MCV: 68.6 fL — ABNORMAL LOW (ref 80.0–100.0)
Monocytes Absolute: 0.5 10*3/uL (ref 0.1–1.0)
Monocytes Absolute: 0.6 10*3/uL (ref 0.1–1.0)
Monocytes Relative: 10 %
Monocytes Relative: 9 %
Neutro Abs: 3.6 10*3/uL (ref 1.7–7.7)
Neutro Abs: 4.2 10*3/uL (ref 1.7–7.7)
Neutrophils Relative %: 65 %
Neutrophils Relative %: 69 %
Platelets: 278 10*3/uL (ref 150–400)
Platelets: 266 10*3/uL (ref 150–400)
RBC: 3.01 MIL/uL — ABNORMAL LOW (ref 3.87–5.11)
RBC: 2.8 MIL/uL — ABNORMAL LOW (ref 3.87–5.11)
RDW: 17.6 % — ABNORMAL HIGH (ref 11.5–15.5)
RDW: 17.2 % — ABNORMAL HIGH (ref 11.5–15.5)
Smear Review: ADEQUATE
WBC: 5.5 10*3/uL (ref 4.0–10.5)
WBC: 6 10*3/uL (ref 4.0–10.5)
nRBC: 0 % (ref 0.0–0.2)
nRBC: 0 % (ref 0.0–0.2)

## 2019-10-31 LAB — HIV ANTIBODY (ROUTINE TESTING W REFLEX): HIV Screen 4th Generation wRfx: NONREACTIVE

## 2019-10-31 LAB — COMPREHENSIVE METABOLIC PANEL
ALT: 21 U/L (ref 0–44)
ALT: 20 U/L (ref 0–44)
AST: 31 U/L (ref 15–41)
AST: 27 U/L (ref 15–41)
Albumin: 3.4 g/dL — ABNORMAL LOW (ref 3.5–5.0)
Albumin: 3.2 g/dL — ABNORMAL LOW (ref 3.5–5.0)
Alkaline Phosphatase: 127 U/L — ABNORMAL HIGH (ref 38–126)
Alkaline Phosphatase: 116 U/L (ref 38–126)
Anion gap: 7 (ref 5–15)
Anion gap: 7 (ref 5–15)
BUN: 11 mg/dL (ref 8–23)
BUN: 10 mg/dL (ref 8–23)
CO2: 22 mmol/L (ref 22–32)
CO2: 23 mmol/L (ref 22–32)
Calcium: 8.4 mg/dL — ABNORMAL LOW (ref 8.9–10.3)
Calcium: 8.2 mg/dL — ABNORMAL LOW (ref 8.9–10.3)
Chloride: 108 mmol/L (ref 98–111)
Chloride: 110 mmol/L (ref 98–111)
Creatinine, Ser: 0.78 mg/dL (ref 0.44–1.00)
Creatinine, Ser: 0.82 mg/dL (ref 0.44–1.00)
GFR calc Af Amer: >60 mL/min (ref >60)
GFR calc Af Amer: >60 mL/min (ref >60)
GFR calc non Af Amer: >60 mL/min (ref >60)
GFR calc non Af Amer: >60 mL/min (ref >60)
Glucose, Bld: 105 mg/dL — ABNORMAL HIGH (ref 70–99)
Glucose, Bld: 102 mg/dL — ABNORMAL HIGH (ref 70–99)
Potassium: 4 mmol/L (ref 3.5–5.1)
Potassium: 3.9 mmol/L (ref 3.5–5.1)
Sodium: 137 mmol/L (ref 135–145)
Sodium: 140 mmol/L (ref 135–145)
Total Bilirubin: 0.8 mg/dL (ref 0.3–1.2)
Total Bilirubin: 0.6 mg/dL (ref 0.3–1.2)
Total Protein: 6.7 g/dL (ref 6.5–8.1)
Total Protein: 6 g/dL — ABNORMAL LOW (ref 6.5–8.1)

## 2019-10-31 LAB — URINALYSIS, ROUTINE W REFLEX MICROSCOPIC
Bilirubin Urine: NEGATIVE
Glucose, UA: NEGATIVE mg/dL
Hgb urine dipstick: NEGATIVE
Ketones, ur: NEGATIVE mg/dL
Nitrite: NEGATIVE
Protein, ur: NEGATIVE mg/dL
Specific Gravity, Urine: 1.015 (ref 1.005–1.030)
pH: 7 (ref 5.0–8.0)

## 2019-10-31 LAB — PROTIME-INR
INR: 1 (ref 0.8–1.2)
Prothrombin Time: 12.7 seconds (ref 11.4–15.2)

## 2019-10-31 LAB — RETICULOCYTES
Immature Retic Fract: 30.7 % — ABNORMAL HIGH (ref 2.3–15.9)
RBC.: 2.72 MIL/uL — ABNORMAL LOW (ref 3.87–5.11)
Retic Count, Absolute: 59.3 10*3/uL (ref 19.0–186.0)
Retic Ct Pct: 2.2 % (ref 0.4–3.1)

## 2019-10-31 LAB — BRAIN NATRIURETIC PEPTIDE: B Natriuretic Peptide: 262.5 pg/mL — ABNORMAL HIGH (ref 0.0–100.0)

## 2019-10-31 LAB — PREPARE RBC (CROSSMATCH)

## 2019-10-31 LAB — HEMOGLOBIN AND HEMATOCRIT, BLOOD
HCT: 31.8 % — ABNORMAL LOW (ref 36.0–46.0)
Hemoglobin: 9.4 g/dL — ABNORMAL LOW (ref 12.0–15.0)

## 2019-10-31 LAB — URINALYSIS, MICROSCOPIC (REFLEX)

## 2019-10-31 LAB — OCCULT BLOOD X 1 CARD TO LAB, STOOL: Fecal Occult Bld: POSITIVE — AB

## 2019-10-31 LAB — IRON AND TIBC
Iron: 10 ug/dL — ABNORMAL LOW (ref 28–170)
Saturation Ratios: 2 % — ABNORMAL LOW (ref 10.4–31.8)
TIBC: 564 ug/dL — ABNORMAL HIGH (ref 250–450)
UIBC: 554 ug/dL

## 2019-10-31 LAB — APTT: aPTT: 28 seconds (ref 24–36)

## 2019-10-31 LAB — RESPIRATORY PANEL BY RT PCR (FLU A&B, COVID)
Influenza A by PCR: NEGATIVE
Influenza B by PCR: NEGATIVE
SARS Coronavirus 2 by RT PCR: NEGATIVE

## 2019-10-31 LAB — FERRITIN: Ferritin: 6 ng/mL — ABNORMAL LOW (ref 11–307)

## 2019-10-31 LAB — ABO/RH: ABO/RH(D): O NEG

## 2019-10-31 LAB — TSH: TSH: 2.407 u[IU]/mL (ref 0.350–4.500)

## 2019-10-31 LAB — FOLATE: Folate: 11.6 ng/mL (ref >5.9)

## 2019-10-31 LAB — VITAMIN B12: Vitamin B-12: 213 pg/mL (ref 180–914)

## 2019-10-31 LAB — GLUCOSE, CAPILLARY: Glucose-Capillary: 86 mg/dL (ref 70–99)

## 2019-10-31 MED ORDER — PANTOPRAZOLE SODIUM 40 MG IV SOLR: 40.0000 mg | Freq: Two times a day (BID) | INTRAVENOUS | Status: DC

## 2019-10-31 MED ORDER — RISPERIDONE 0.25 MG PO TABS: 0.2500 mg | ORAL_TABLET | ORAL | Status: DC

## 2019-10-31 MED ORDER — SODIUM CHLORIDE 0.9% IV SOLUTION: Freq: Once | INTRAVENOUS | Status: AC

## 2019-10-31 MED ORDER — PEG-KCL-NACL-NASULF-NA ASC-C 100 G PO SOLR: 1.0000 | Freq: Once | ORAL | Status: DC

## 2019-10-31 MED ORDER — ONDANSETRON HCL 4 MG/2ML IJ SOLN: 4.0000 mg | Freq: Four times a day (QID) | INTRAMUSCULAR | Status: DC | PRN

## 2019-10-31 MED ORDER — FUROSEMIDE 10 MG/ML IJ SOLN
20.0000 mg | Freq: Once | INTRAMUSCULAR | Status: DC
  Filled 2019-10-31: qty 2

## 2019-10-31 MED ORDER — SODIUM CHLORIDE 0.9% FLUSH
3.0000 mL | Freq: Two times a day (BID) | INTRAVENOUS | Status: DC
  Administered 2019-11-01: 3 mL via INTRAVENOUS

## 2019-10-31 MED ORDER — PEG-KCL-NACL-NASULF-NA ASC-C 100 G PO SOLR
0.5000 | Freq: Once | ORAL | Status: AC
  Administered 2019-10-31: 100 g via ORAL
  Filled 2019-10-31: qty 1

## 2019-10-31 MED ORDER — LORAZEPAM 0.5 MG PO TABS
0.5000 mg | ORAL_TABLET | Freq: Three times a day (TID) | ORAL | Status: DC
  Administered 2019-10-31 – 2019-11-02 (×5): 0.5 mg via ORAL
  Filled 2019-10-31 (×5): qty 1

## 2019-10-31 MED ORDER — ESCITALOPRAM OXALATE 20 MG PO TABS
20.0000 mg | ORAL_TABLET | Freq: Every day | ORAL | Status: DC
  Administered 2019-10-31 – 2019-11-02 (×2): 20 mg via ORAL
  Filled 2019-10-31 (×2): qty 1

## 2019-10-31 MED ORDER — RISPERIDONE 0.5 MG PO TABS
0.5000 mg | ORAL_TABLET | Freq: Every day | ORAL | Status: DC
  Administered 2019-10-31 – 2019-11-01 (×2): 0.5 mg via ORAL
  Filled 2019-10-31 (×2): qty 1

## 2019-10-31 MED ORDER — SODIUM CHLORIDE 0.9 % IV SOLN
80.0000 mg | Freq: Once | INTRAVENOUS | Status: AC
  Administered 2019-10-31: 80 mg via INTRAVENOUS
  Filled 2019-10-31: qty 80

## 2019-10-31 MED ORDER — ONDANSETRON HCL 4 MG PO TABS: 4.0000 mg | ORAL_TABLET | Freq: Four times a day (QID) | ORAL | Status: DC | PRN

## 2019-10-31 MED ORDER — RISPERIDONE 0.25 MG PO TABS
0.2500 mg | ORAL_TABLET | Freq: Every day | ORAL | Status: DC
  Administered 2019-10-31 – 2019-11-02 (×2): 0.25 mg via ORAL
  Filled 2019-10-31 (×2): qty 1

## 2019-10-31 MED ORDER — PEG-KCL-NACL-NASULF-NA ASC-C 100 G PO SOLR
0.5000 | Freq: Once | ORAL | Status: AC
  Administered 2019-11-01: 100 g via ORAL

## 2019-10-31 MED ORDER — LEVOTHYROXINE SODIUM 50 MCG PO TABS
50.0000 ug | ORAL_TABLET | Freq: Every day | ORAL | Status: DC
  Administered 2019-11-02: 50 ug via ORAL
  Filled 2019-10-31 (×2): qty 1

## 2019-10-31 MED ORDER — PANTOPRAZOLE SODIUM 40 MG IV SOLR
40.0000 mg | Freq: Once | INTRAVENOUS | Status: AC
  Administered 2019-10-31: 40 mg via INTRAVENOUS
  Filled 2019-10-31: qty 40

## 2019-10-31 MED ORDER — FUROSEMIDE 10 MG/ML IJ SOLN
20.0000 mg | Freq: Once | INTRAMUSCULAR | Status: AC
  Administered 2019-10-31: 20 mg via INTRAVENOUS
  Filled 2019-10-31: qty 2

## 2019-10-31 MED ORDER — SODIUM CHLORIDE 0.9 % IV SOLN
8.0000 mg/h | INTRAVENOUS | Status: DC
  Administered 2019-10-31 – 2019-11-01 (×3): 8 mg/h via INTRAVENOUS
  Filled 2019-10-31 (×4): qty 80

## 2019-10-31 MED ORDER — FUROSEMIDE 10 MG/ML IJ SOLN
20.0000 mg | Freq: Once | INTRAMUSCULAR | Status: AC
  Administered 2019-10-31: 20 mg via INTRAVENOUS

## 2019-10-31 NOTE — ED Triage Notes (Addendum)
Family reports pt called her tonight and was confused. Pt is normally oriented x4

## 2019-10-31 NOTE — ED Notes (Signed)
ED Provider at bedside. 

## 2019-10-31 NOTE — Progress Notes (Signed)
Pt arrived from [facility] @ 0550.  Pt A&O X 2-3 w/ some confusion. Doesn't know year and says her age is 79.

## 2019-10-31 NOTE — Consult Note (Signed)
Referring Provider:  [facility]         Primary Care Physician:  Chevis Pretty, FNP Primary Gastroenterologist:    Wilfrid Lund, MD          We were asked to see this patient for:   Iron deficiency anemia               ASSESSMENT /  PLAN    69 year old female with PMH significant for hypothyroidism, osteoporosis, anxiety, depression, hyperlipidemia, GERD  #Iron deficiency anemia/Hemoccult positive stool.  --Rule out colon neoplasm. Rule out chronic blood loss from PUD, intestinal AVM --Hemoglobin 5.7 down from 13 in 2017.   Sister says patient was not anemic one year ago based on labs by PCP. --Patient has developmental delay so history questionably reliable.. Sister (POA) recalls seeing dark stool last week.    -Chronic abdominal pain. No new abdominal pain. No vomiting or other focal GI symptoms.  --Needs EGD and colonoscopy for evaluation of IDA. Patient has never had a screening colonoscopy for fear of anesthesia. Initially she repeatedly declined but has no agreed.  The risks and benefits of EGD and colonoscopy with possible polypectomy / biopsies were discussed and the patient agrees to proceed.  --She is getting transfused PRBC now. Two units were ordered. Needs post transfusion H+H.   #Recent fall / confusion on admission --Head CT scan negative  HPI:    Chief Complaint:  none  Renee Davidson is a 69 y.o. female with developmental delay admitted with anemia. History mainly comes from chart and two sisters. Takiyah Drzewiecki resides in an assisted living facility and brought to ED for confusion. Found to have profound iron deficiency anemia. Sister noticed dark BM last week. Patient takes daily baby asa and just a few doses of Aleve or like med a month. Sisters says patient has chronic abdominal pain and chronic back pain and this is unchanged. Her BMs vary in consistency depending on diet. Grandfather had colon cancer in his 58's. Sister has history of colon polyps. Patient  had a bad experience with anesthesia once as a child and has been resistant to undergo procedures since. She was seen by [place] in 2017 for evaluation of dysphagia felt to be related largely to inattention to safe eating habits. Esophogram done and negative.    Past Medical History:  Diagnosis Date  . Anxiety   . Depression   . GERD (gastroesophageal reflux disease)   . Hyperlipidemia   . Mental developmental delay   . OCD (obsessive compulsive disorder)   . Osteoporosis   . Thyroid disease     Past Surgical History:  Procedure Laterality Date  . TONSILLECTOMY      Prior to Admission medications   Medication Sig Start Date End Date Taking? Authorizing Provider  escitalopram (LEXAPRO) 20 MG tablet Take 1 tablet (20 mg total) by mouth daily. 09/06/19  Yes Hassell Done, Embry-Margaret, FNP  levothyroxine (SYNTHROID) 50 MCG tablet Take 1 tablet (50 mcg total) by mouth daily. 09/06/19  Yes Martin, Chenise-Margaret, FNP  LORazepam (ATIVAN) 0.5 MG tablet Take 1 tablet (0.5 mg total) by mouth 3 (three) times daily. 09/06/19  Yes Martin, Raeya-Margaret, FNP  lovastatin (MEVACOR) 40 MG tablet Take 1 tablet (40 mg total) by mouth at bedtime. 09/06/19  Yes Martin, Kayah-Margaret, FNP  pantoprazole (PROTONIX) 40 MG tablet Take 1 tablet (40 mg total) by mouth daily. 09/06/19  Yes Martin, Jasmon-Margaret, FNP  risperiDONE (RISPERDAL) 0.25 MG tablet TAKE 1 TABLET IN THE MORNING AND  2 TABLETS AT BEDTIME Patient taking differently: Take 0.25-0.5 mg by mouth See admin instructions. TAKE 0.25mg  IN THE MORNING AND 0.50mg  AT BEDTIME 09/06/19  Yes Hassell Done, Misk-Margaret, FNP  aspirin EC 81 MG tablet Take 81 mg by mouth daily.    [provider]  benzonatate (TESSALON PERLES) 100 MG capsule Take 1 capsule (100 mg total) by mouth 3 (three) times daily as needed for cough. 07/07/18   Baruch Gouty, FNP  Calcium-Vitamin D-Vitamin K 500-100-40 MG-UNT-MCG CHEW Chew 1 tablet by mouth 2 (two) times daily.     [provider]    esomeprazole (NEXIUM) 40 MG capsule TAKE (1) CAPSULE DAILY 09/06/19   Chevis Pretty, FNP  nystatin cream (MYCOSTATIN) Apply to affected area 2 times daily 11/09/18   Chevis Pretty, FNP    Current Facility-Administered Medications  Medication Dose Route Frequency Provider Last Rate Last Admin  . 0.9 %  sodium chloride infusion (Manually program via Guardrails IV Fluids)   Intravenous Once Annita Brod, MD      . furosemide (LASIX) injection 20 mg  20 mg Intravenous Once Annita Brod, MD      . furosemide (LASIX) injection 20 mg  20 mg Intravenous Once Annita Brod, MD      . ondansetron [facility]) tablet 4 mg  4 mg Oral Q6H PRN Annita Brod, MD       Or  . ondansetron [facility]) injection 4 mg  4 mg Intravenous Q6H PRN Annita Brod, MD      . pantoprazole (PROTONIX) 80 mg in sodium chloride 0.9 % 100 mL (0.8 mg/mL) infusion  8 mg/hr Intravenous Continuous Rise Patience, MD      . Derrill Memo ON 11/03/2019] pantoprazole (PROTONIX) injection 40 mg  40 mg Intravenous Q12H Gean Birchwood N, MD      . sodium chloride flush (NS) 0.9 % injection 3 mL  3 mL Intravenous Q12H Annita Brod, MD        Allergies as of 10/31/2019 - Review Complete 10/31/2019  Allergen Reaction Noted  . Doxycycline Rash 03/01/2018    Family History  Problem Relation Age of Onset  . Diabetes Father   . Hyperlipidemia Father   . Hypertension Father   . Atrial fibrillation Father   . Hyperlipidemia Sister   . Dysphagia Sister   . GI problems Mother   . Heart disease Maternal Grandfather   . Colon cancer Maternal Grandfather     Social History   Socioeconomic History  . Marital status: Single    Spouse name: Not on file  . Number of children: 0  . Years of education: 69  . Highest education level: 12th grade  Occupational History  . Occupation: retired    Comment: Special educational needs teacher  Tobacco Use  . Smoking status: Never Smoker  . Smokeless tobacco: Never Used   Substance and Sexual Activity  . Alcohol use: No  . Drug use: No  . Sexual activity: Never  Other Topics Concern  . Not on file  Social History Narrative  . Not on file   Social Determinants of Health   Financial Resource Strain:   . Difficulty of Paying Living Expenses:   Food Insecurity:   . Worried About Charity fundraiser in the Last Year:   . Arboriculturist in the Last Year:   Transportation Needs:   . Film/video editor (Medical):   Marland Kitchen Lack of Transportation (Non-Medical):   Physical Activity:   .  Days of Exercise per Week:   . Minutes of Exercise per Session:   Stress:   . Feeling of Stress :   Social Connections:   . Frequency of Communication with Friends and Family:   . Frequency of Social Gatherings with Friends and Family:   . Attends Religious Services:   . Active Member of Clubs or Organizations:   . Attends Archivist Meetings:   Marland Kitchen Marital Status:   Intimate Partner Violence:   . Fear of Current or Ex-Partner:   . Emotionally Abused:   Marland Kitchen Physically Abused:   . Sexually Abused:     Review of Systems: Unobtainable. Patient admitted with confusion and she has developmental delay.   Physical Exam: Vital signs in last 24 hours: Temp:  [97.7 F (36.5 C)-98.9 F (37.2 C)] 98 F (36.7 C) (03/31 1015) Pulse Rate:  [84-95] 88 (03/31 0600) Resp:  [14-25] 20 (03/31 0600) BP: (115-147)/(56-67) 115/64 (03/31 1015) SpO2:  [97 %-100 %] 100 % (03/31 1015) Weight:  [90.3 kg-92.3 kg] 90.3 kg (03/31 0600) Last BM Date: 10/30/19 General:   Alert, obese female in NAD Psych:  Pleasant, cooperative. Normal mood and affect. Eyes:  Pupils equal, sclera clear, no icterus.   Conjunctiva pink. Ears:  Normal auditory acuity. Nose:  No deformity, discharge,  or lesions. Neck:  Supple; no masses Lungs:  Clear throughout to auscultation.   No wheezes, crackles, or rhonchi.  Heart:  Regular rate and rhythm;  no lower extremity edema Abdomen:  Soft,  non-distended, nontender, BS active, no palp mass   Rectal:  Deferred  Msk:  Symmetrical without gross deformities. . Neurologic:  Alert and  oriented x4;  grossly normal neurologically. Skin:  Intact without significant lesions or rashes.   Intake/Output from previous day: No intake/output data recorded. Intake/Output this shift: No intake/output data recorded.  Lab Results: Recent Labs    10/31/19 0134 10/31/19 0657  WBC 5.5 6.0  HGB 5.7* 5.2*  HCT 20.5* 19.2*  PLT 278 266   BMET Recent Labs    10/31/19 0134 10/31/19 0657  NA 137 140  K 4.0 3.9  CL 108 110  CO2 22 23  GLUCOSE 105* 102*  BUN 11 10  CREATININE 0.78 0.82  CALCIUM 8.4* 8.2*   LFT Recent Labs    10/31/19 0657  PROT 6.0*  ALBUMIN 3.2*  AST 27  ALT 20  ALKPHOS 116  BILITOT 0.6   PT/INR Recent Labs    10/31/19 0137  LABPROT 12.7  INR 1.0   Hepatitis Panel No results for input(s): HEPBSAG, HCVAB, HEPAIGM, HEPBIGM in the last 72 hours.   . CBC Latest Ref Rng & Units 10/31/2019 10/31/2019 03/26/2019  WBC 4.0 - 10.5 K/uL 6.0 5.5 WILL FOLLOW  Hemoglobin 12.0 - 15.0 g/dL 5.2(LL) 5.7(LL) WILL FOLLOW  Hematocrit 36.0 - 46.0 % 19.2(L) 20.5(L) WILL FOLLOW  Platelets 150 - 400 K/uL 266 278 WILL FOLLOW    . CMP Latest Ref Rng & Units 10/31/2019 10/31/2019 03/26/2019  Glucose 70 - 99 mg/dL 102(H) 105(H) 88  BUN 8 - 23 mg/dL 10 11 9   Creatinine 0.44 - 1.00 mg/dL 0.82 0.78 0.85  Sodium 135 - 145 mmol/L 140 137 143  Potassium 3.5 - 5.1 mmol/L 3.9 4.0 3.9  Chloride 98 - 111 mmol/L 110 108 106  CO2 22 - 32 mmol/L 23 22 23   Calcium 8.9 - 10.3 mg/dL 8.2(L) 8.4(L) 9.2  Total Protein 6.5 - 8.1 g/dL 6.0(L) 6.7 6.5  Total Bilirubin  0.3 - 1.2 mg/dL 0.6 0.8 0.3  Alkaline Phos 38 - 126 U/L 116 127(H) 147(H)  AST 15 - 41 U/L 27 31 20   ALT 0 - 44 U/L 20 21 14    Studies/Results: CT Head Wo Contrast  Result Date: 10/31/2019 CLINICAL DATA:  Fall several days ago with increased mental status change today EXAM:  CT HEAD WITHOUT CONTRAST TECHNIQUE: Contiguous axial images were obtained from the base of the skull through the vertex without intravenous contrast. COMPARISON:  03/27/2016 FINDINGS: Brain: Atrophic changes are noted. No findings to suggest acute hemorrhage, acute infarction or space-occupying mass lesion are noted. Vascular: No hyperdense vessel or unexpected calcification. Skull: Normal. Negative for fracture or focal lesion. Sinuses/Orbits: No acute finding. Other: None. IMPRESSION: Mild atrophic changes without acute abnormality. Electronically Signed   By: Inez Catalina M.D.   On: 10/31/2019 02:43    Principal Problem:   Acute GI bleeding Active Problems:   Hypothyroidism   Hyperlipidemia with target LDL less than 100   GAD (generalized anxiety disorder)   GERD (gastroesophageal reflux disease)   OCD (obsessive compulsive disorder)   Primary insomnia   Obesity (BMI 30-39.9)   Acute metabolic encephalopathy    Tye Savoy, NP-C @  10/31/2019, 10:20 AM

## 2019-10-31 NOTE — ED Notes (Signed)
Pt's sister is taking pt's purse home with her along with pt's shirt, bra, and jacket

## 2019-10-31 NOTE — ED Provider Notes (Signed)
[facility] EMERGENCY DEPARTMENT Provider Note   CSN: [id number] Arrival date & time: 10/31/19  0111     History Chief Complaint  Patient presents with  . Altered Mental Status    Renee Davidson is a 69 y.o. female.  HPI    Level 5 caveat for cognitive delay.  69 year old female comes in a chief complaint of altered mental status. Patient has history of anxiety, depression, GERD, hyperlipidemia and cognitive delay.   Patient's sister is at the bedside.  She reports that patient called around 1130 and appeared disoriented.  She called and asked " who was she calling", " what was her personal phone number", " where what she" etc.  Patient resides at an independent retirement community and patient's sister went to check on her.  The staff working at the facility informed that patient was wandering around, and that she decided to bring her into the ER.  Patient has no history of stroke or heart attack. Patient appears pale to me.  She does indicate that she has noticed some dark tarry stools.  She has no history of colonoscopy or anemia requiring transfusion.  Review of system is also positive for some urinary discomfort.  Past Medical History:  Diagnosis Date  . Anxiety   . Depression   . GERD (gastroesophageal reflux disease)   . Hyperlipidemia   . Mental developmental delay   . OCD (obsessive compulsive disorder)   . Osteoporosis   . Thyroid disease     Patient Active Problem List   Diagnosis Date Noted  . Symptomatic anemia 10/31/2019  . Primary insomnia 06/06/2017  . Encephalopathy 03/27/2016  . Hypothyroidism 01/17/2014  . Hyperlipidemia with target LDL less than 100 01/17/2014  . GAD (generalized anxiety disorder) 01/17/2014  . GERD (gastroesophageal reflux disease) 01/17/2014  . OCD (obsessive compulsive disorder) 01/17/2014  . Osteoporosis, post-menopausal 12/05/2013    Past Surgical History:  Procedure Laterality Date  . TONSILLECTOMY       OB  History   No obstetric history on file.     Family History  Problem Relation Age of Onset  . Diabetes Father   . Hyperlipidemia Father   . Hypertension Father   . Atrial fibrillation Father   . Hyperlipidemia Sister   . Dysphagia Sister   . GI problems Mother   . Heart disease Maternal Grandfather   . Colon cancer Maternal Grandfather     Social History   Tobacco Use  . Smoking status: Never Smoker  . Smokeless tobacco: Never Used  Substance Use Topics  . Alcohol use: No  . Drug use: No    Home Medications Prior to Admission medications   Medication Sig Start Date End Date Taking? Authorizing Provider  aspirin EC 81 MG tablet Take 81 mg by mouth daily.    [provider]  benzonatate (TESSALON PERLES) 100 MG capsule Take 1 capsule (100 mg total) by mouth 3 (three) times daily as needed for cough. 07/07/18   Baruch Gouty, FNP  Calcium-Vitamin D-Vitamin K 500-100-40 MG-UNT-MCG CHEW Chew 1 tablet by mouth 2 (two) times daily.     [provider]  escitalopram (LEXAPRO) 20 MG tablet Take 1 tablet (20 mg total) by mouth daily. 09/06/19   Hassell Done Cloyce-Margaret, FNP  esomeprazole (NEXIUM) 40 MG capsule TAKE (1) CAPSULE DAILY 09/06/19   Hassell Done, Illyria-Margaret, FNP  levothyroxine (SYNTHROID) 50 MCG tablet Take 1 tablet (50 mcg total) by mouth daily. 09/06/19   Chevis Pretty, FNP  LORazepam (  ATIVAN) 0.5 MG tablet Take 1 tablet (0.5 mg total) by mouth 3 (three) times daily. 09/06/19   Hassell Done, Tommie-Margaret, FNP  lovastatin (MEVACOR) 40 MG tablet Take 1 tablet (40 mg total) by mouth at bedtime. 09/06/19   Hassell Done Ryane-Margaret, FNP  nystatin cream (MYCOSTATIN) Apply to affected area 2 times daily 11/09/18   Hassell Done, Kahleah-Margaret, FNP  pantoprazole (PROTONIX) 40 MG tablet Take 1 tablet (40 mg total) by mouth daily. 09/06/19   Hassell Done Jeanene-Margaret, FNP  risperiDONE (RISPERDAL) 0.25 MG tablet TAKE 1 TABLET IN THE MORNING AND 2 TABLETS AT BEDTIME 09/06/19   Hassell Done Atonya-Margaret,  FNP    Allergies    Doxycycline  Review of Systems   Review of Systems  Constitutional: Positive for activity change.  Respiratory: Negative for shortness of breath.   Cardiovascular: Negative for chest pain.  Gastrointestinal: Negative for nausea and vomiting.  Neurological: Positive for dizziness.  Hematological: Does not bruise/bleed easily.    Physical Exam Updated Vital Signs BP (!) 147/67   Pulse 89   Temp 98.9 F (37.2 C) (Oral)   Resp (!) 25   Ht 5\' 6"  (1.676 m)   Wt 92.3 kg   SpO2 99%   BMI 32.84 kg/m   Physical Exam Vitals and nursing note reviewed.  Constitutional:      Appearance: She is well-developed.  HENT:     Head: Normocephalic and atraumatic.  Cardiovascular:     Rate and Rhythm: Normal rate.  Pulmonary:     Effort: Pulmonary effort is normal.  Abdominal:     General: Bowel sounds are normal.  Musculoskeletal:     Cervical back: Normal range of motion and neck supple.  Skin:    General: Skin is warm and dry.     Coloration: Skin is pale.  Neurological:     Mental Status: She is alert and oriented to person, place, and time.     ED Results / Procedures / Treatments   Labs (all labs ordered are listed, but only abnormal results are displayed) Labs Reviewed  COMPREHENSIVE METABOLIC PANEL - Abnormal; Notable for the following components:      Result Value   Glucose, Bld 105 (*)    Calcium 8.4 (*)    Albumin 3.4 (*)    Alkaline Phosphatase 127 (*)    All other components within normal limits  CBC WITH DIFFERENTIAL/PLATELET - Abnormal; Notable for the following components:   RBC 3.01 (*)    Hemoglobin 5.7 (*)    HCT 20.5 (*)    MCV 68.1 (*)    MCH 18.9 (*)    MCHC 27.8 (*)    RDW 17.6 (*)    All other components within normal limits  OCCULT BLOOD X 1 CARD TO LAB, STOOL - Abnormal; Notable for the following components:   Fecal Occult Bld POSITIVE (*)    All other components within normal limits  RESPIRATORY PANEL BY RT PCR (FLU  A&B, COVID)  PROTIME-INR  APTT  URINALYSIS, ROUTINE W REFLEX MICROSCOPIC    EKG EKG Interpretation  Date/Time:  Wednesday October 31 2019 01:31:27 EDT Ventricular Rate:  84 PR Interval:    QRS Duration: 94 QT Interval:  385 QTC Calculation: 456 R Axis:   55 Text Interpretation: Sinus rhythm Abnormal R-wave progression, early transition No acute changes No significant change since last tracing Confirmed by Varney Biles [id number]) on 10/31/2019 2:27:13 AM   Radiology CT Head Wo Contrast  Result Date: 10/31/2019 CLINICAL DATA:  Fall several days ago  with increased mental status change today EXAM: CT HEAD WITHOUT CONTRAST TECHNIQUE: Contiguous axial images were obtained from the base of the skull through the vertex without intravenous contrast. COMPARISON:  03/27/2016 FINDINGS: Brain: Atrophic changes are noted. No findings to suggest acute hemorrhage, acute infarction or space-occupying mass lesion are noted. Vascular: No hyperdense vessel or unexpected calcification. Skull: Normal. Negative for fracture or focal lesion. Sinuses/Orbits: No acute finding. Other: None. IMPRESSION: Mild atrophic changes without acute abnormality. Electronically Signed   By: Inez Catalina M.D.   On: 10/31/2019 02:43    Procedures Procedures (including critical care time)  Medications Ordered in ED Medications  pantoprazole (PROTONIX) injection 40 mg (40 mg Intravenous Given 10/31/19 0303)    ED Course  I have reviewed the triage vital signs and the nursing notes.  Pertinent labs & imaging results that were available during my care of the patient were reviewed by me and considered in my medical decision making (see chart for details).    MDM Rules/Calculators/A&P                      69 year old comes in a chief complaint of confusion. She has no significant medical history.  There is some cognitive delay and the sister provides the bulk of the meaningful history.  Differential diagnosis includes UTI,  TIA, new onset dementia, delirium, electrolyte abnormalities.  She also appears pale.  Anemia is possible.  Finally with the fall there could be CNS bleed.  Appropriate work-up initiated and patient is noted to have symptomatic anemia with hemoglobin of 5.7.  She will be transferred to [facility].  Patient has guaiac positive stools without melena.  GI service has not been consulted.  Patient has been kept n.p.o. here.  Admitting team can consult GI once patient arrives to the hospital.  Final Clinical Impression(s) / ED Diagnoses Final diagnoses:  Symptomatic anemia    Rx / DC Orders ED Discharge Orders    None       Varney Biles, MD 10/31/19 [phone]

## 2019-10-31 NOTE — ED Notes (Signed)
Pt transferred to [facility] via [facility]. 

## 2019-10-31 NOTE — H&P (Addendum)
History and Physical  Renee Davidson [id number] DOB: 1951/01/16 DOA: 10/31/2019  Referring physician: Varney Biles, ER physician PCP: Chevis Pretty, [place]  Outpatient Specialists: None Patient coming from: Independent living & is able to ambulate without assistance  Chief Complaint: Confusion  HPI: Renee Davidson is a 69 y.o. female with medical history significant for mild developmental delay, anxiety and hypothyroidism who was sent over from her independent living after reports of confusion for the past 24 hours.  Patient did not know exactly where she was or her age, which was different from her baseline.  ED Course: In the emergency room, patient was noted to have a stable blood pressure and heart rate, no fever.  Confusion was confirmed in interview.  Most concerning however was her lab work which noted a hemoglobin of 5.7 with an MCV of 68.  Further anemia panel noted iron deficiency anemia.  Patient did not give any history of bloody stools, hematemesis, change in bowel habits other than some increasing loose stools in the past week or so.  She also noted no weight loss in the last 6 months.  Hospitalist were called for further evaluation and patient was transferred to [facility] and following arrival, 2 units packed red blood cells were ordered.  GI was consulted.  Review of Systems: Patient seen after arrival to floor. Pt complains of feeling tired.  She is also noted increasing loose stool over the past week or so  Pt denies any headaches, vision changes, dysphagia, chest pain, palpitations, shortness of breath, wheeze, cough, abdominal pain, hematuria, dysuria, constipation, focal extremity numbness weakness or pain.  Review of systems are otherwise negative   Past Medical History:  Diagnosis Date  . Anxiety   . Depression   . GERD (gastroesophageal reflux disease)   . Hyperlipidemia   . Mental developmental delay   . OCD (obsessive compulsive disorder)   .  Osteoporosis   . Thyroid disease    Past Surgical History:  Procedure Laterality Date  . TONSILLECTOMY      Social History:  reports that she has never smoked. She has never used smokeless tobacco. She reports that she does not drink alcohol or use drugs.  Lives in independent living.  Ambulates without assistance   Allergies  Allergen Reactions  . Doxycycline Rash    Family History  Problem Relation Age of Onset  . Diabetes Father   . Hyperlipidemia Father   . Hypertension Father   . Atrial fibrillation Father   . Hyperlipidemia Sister   . Dysphagia Sister   . GI problems Mother   . Heart disease Maternal Grandfather   . Colon cancer Maternal Grandfather       Prior to Admission medications   Medication Sig Start Date End Date Taking? Authorizing Provider  escitalopram (LEXAPRO) 20 MG tablet Take 1 tablet (20 mg total) by mouth daily. 09/06/19  Yes Hassell Done, Mykaila-Margaret, FNP  levothyroxine (SYNTHROID) 50 MCG tablet Take 1 tablet (50 mcg total) by mouth daily. 09/06/19  Yes Martin, Jailyn-Margaret, FNP  LORazepam (ATIVAN) 0.5 MG tablet Take 1 tablet (0.5 mg total) by mouth 3 (three) times daily. 09/06/19  Yes Martin, Makailey-Margaret, FNP  lovastatin (MEVACOR) 40 MG tablet Take 1 tablet (40 mg total) by mouth at bedtime. 09/06/19  Yes Martin, Zienna-Margaret, FNP  pantoprazole (PROTONIX) 40 MG tablet Take 1 tablet (40 mg total) by mouth daily. 09/06/19  Yes Martin, Briann-Margaret, FNP  risperiDONE (RISPERDAL) 0.25 MG tablet TAKE 1 TABLET IN THE MORNING  AND 2 TABLETS AT BEDTIME Patient taking differently: Take 0.25-0.5 mg by mouth See admin instructions. TAKE 0.25mg  IN THE MORNING AND 0.50mg  AT BEDTIME 09/06/19  Yes Hassell Done, Ashleen-Margaret, FNP  aspirin EC 81 MG tablet Take 81 mg by mouth daily.    [provider]  benzonatate (TESSALON PERLES) 100 MG capsule Take 1 capsule (100 mg total) by mouth 3 (three) times daily as needed for cough. 07/07/18   Baruch Gouty, FNP  Calcium-Vitamin  D-Vitamin K 500-100-40 MG-UNT-MCG CHEW Chew 1 tablet by mouth 2 (two) times daily.     [provider]  esomeprazole (NEXIUM) 40 MG capsule TAKE (1) CAPSULE DAILY 09/06/19   Chevis Pretty, FNP  nystatin cream (MYCOSTATIN) Apply to affected area 2 times daily 11/09/18   Chevis Pretty, FNP    Physical Exam: BP (!) 144/74   Pulse 94   Temp (!) 97.5 F (36.4 C) (Axillary)   Resp 20   Ht 5\' 5"  (1.651 m)   Wt 90.3 kg   SpO2 100%   BMI 33.13 kg/m   General: Alert and oriented x2, no acute distress.  According to her sisters, she is at baseline Eyes: Sclera nonicteric, extraocular movements are intact ENT: Normocephalic and atraumatic, mucous membranes are slightly dry Neck: Supple, no JVD Cardiovascular: Regular rate and rhythm, S1-S2 Respiratory: Clear to auscultation bilaterally Abdomen: Soft, nontender, nondistended, hypoactive bowel sounds Skin: No skin breaks, tears or lesions Musculoskeletal: No clubbing or cyanosis or edema Psychiatric: Appropriate, no evidence of psychoses Neurologic: No focal deficits          Labs on Admission:  Basic Metabolic Panel: Recent Labs  Lab 10/31/19 0134 10/31/19 0657  NA 137 140  K 4.0 3.9  CL 108 110  CO2 22 23  GLUCOSE 105* 102*  BUN 11 10  CREATININE 0.78 0.82  CALCIUM 8.4* 8.2*   Liver Function Tests: Recent Labs  Lab 10/31/19 0134 10/31/19 0657  AST 31 27  ALT 21 20  ALKPHOS 127* 116  BILITOT 0.8 0.6  PROT 6.7 6.0*  ALBUMIN 3.4* 3.2*   No results for input(s): LIPASE, AMYLASE in the last 168 hours. No results for input(s): AMMONIA in the last 168 hours. CBC: Recent Labs  Lab 10/31/19 0134 10/31/19 0657  WBC 5.5 6.0  NEUTROABS 3.6 4.2  HGB 5.7* 5.2*  HCT 20.5* 19.2*  MCV 68.1* 68.6*  PLT 278 266   Cardiac Enzymes: No results for input(s): CKTOTAL, CKMB, CKMBINDEX, TROPONINI in the last 168 hours.  BNP (last 3 results) Recent Labs    10/31/19 0940  BNP 262.5*    ProBNP (last 3  results) No results for input(s): PROBNP in the last 8760 hours.  CBG: No results for input(s): GLUCAP in the last 168 hours.  Radiological Exams on Admission: CT Head Wo Contrast  Result Date: 10/31/2019 CLINICAL DATA:  Fall several days ago with increased mental status change today EXAM: CT HEAD WITHOUT CONTRAST TECHNIQUE: Contiguous axial images were obtained from the base of the skull through the vertex without intravenous contrast. COMPARISON:  03/27/2016 FINDINGS: Brain: Atrophic changes are noted. No findings to suggest acute hemorrhage, acute infarction or space-occupying mass lesion are noted. Vascular: No hyperdense vessel or unexpected calcification. Skull: Normal. Negative for fracture or focal lesion. Sinuses/Orbits: No acute finding. Other: None. IMPRESSION: Mild atrophic changes without acute abnormality. Electronically Signed   By: Inez Catalina M.D.   On: 10/31/2019 02:43    EKG: Independently reviewed.  Normal sinus rhythm with early  abnormal R wave progression, however this is unchanged from previous EKG  Assessment/Plan Present on Admission: . Suspected acute GI bleed with presence of symptomatic iron deficiency anemia causing acute metabolic encephalopathy: With fluids and blood, encephalopathy has since resolved.  No signs of immediate bleeding.  GI consulted and will plan to take patient for EGD and colonoscopy tomorrow.  Certainly need to rule out malignancy although no other systemic symptoms.  Patient has never had a previous EGD or scope  . Hypothyroidism: Continue Synthroid  . Hyperlipidemia with target LDL less than 100: Continue statin  . GAD (generalized anxiety disorder)/OCD: Continue SSRI  . GERD (gastroesophageal reflux disease): On IV PPI  . Primary insomnia: Stable  . Obesity (BMI 30-39.9): Patient meets criteria for BMI greater than 30  . Mental developmental delay: Appears to be at baseline  Principal Problem:   Acute GI bleeding Active  Problems:   Hypothyroidism   Hyperlipidemia with target LDL less than 100   GAD (generalized anxiety disorder)   GERD (gastroesophageal reflux disease)   OCD (obsessive compulsive disorder)   Primary insomnia   Obesity (BMI 30-39.9)   Acute metabolic encephalopathy   Iron deficiency anemia   Family history of colon cancer   Family history of colonic polyps   Symptomatic anemia   Mental developmental delay   DVT prophylaxis: SCDs  Code Status: Full code as confirmed by patient.  We spent 20 minutes discussing with and establishing that she did indeed want everything done  Family Communication: Sister is at the bedside  Disposition Plan: Potential discharge in the next few days following scope and ensuring blood levels are stable posttransfusion  Consults called: Gastroenterology  Admission status: Given expectation patient will be here past 2 midnights requiring acute hospital services, have admitted as inpatient    Annita Brod MD [facility] Pager [phone]  If 7PM-7AM, please contact night-coverage [email or web address] Password [facility]  10/31/2019, 12:29 PM

## 2019-10-31 NOTE — ED Triage Notes (Signed)
Family also states pt fell a few days ago with unknown injury.

## 2019-10-31 NOTE — ED Notes (Signed)
Date and time results received: 10/31/19 0230 (use smartphrase ".now" to insert current time)  Test: hgb Critical Value: 5.7  Name of Provider Notified: Dr. Kathrynn Humble  Orders Received? Or Actions Taken?: new orders received and initated

## 2019-10-31 NOTE — ED Notes (Signed)
Pt is in CT

## 2019-10-31 NOTE — H&P (View-Only) (Signed)
Referring Provider:  [facility]         Primary Care Physician:  Chevis Pretty, FNP Primary Gastroenterologist:    Wilfrid Lund, MD          We were asked to see this patient for:   Iron deficiency anemia               ASSESSMENT /  PLAN    69 year old female with PMH significant for hypothyroidism, osteoporosis, anxiety, depression, hyperlipidemia, GERD  #Iron deficiency anemia/Hemoccult positive stool.  --Rule out colon neoplasm. Rule out chronic blood loss from PUD, intestinal AVM --Hemoglobin 5.7 down from 13 in 2017.   Sister says patient was not anemic one year ago based on labs by PCP. --Patient has developmental delay so history questionably reliable.. Sister (POA) recalls seeing dark stool last week.    -Chronic abdominal pain. No new abdominal pain. No vomiting or other focal GI symptoms.  --Needs EGD and colonoscopy for evaluation of IDA. Patient has never had a screening colonoscopy for fear of anesthesia. Initially she repeatedly declined but has no agreed.  The risks and benefits of EGD and colonoscopy with possible polypectomy / biopsies were discussed and the patient agrees to proceed.  --She is getting transfused PRBC now. Two units were ordered. Needs post transfusion H+H.   #Recent fall / confusion on admission --Head CT scan negative  HPI:    Chief Complaint:  none  Renee Davidson is a 69 y.o. female with developmental delay admitted with anemia. History mainly comes from chart and two sisters. Terasa Mceuen resides in an assisted living facility and brought to ED for confusion. Found to have profound iron deficiency anemia. Sister noticed dark BM last week. Patient takes daily baby asa and just a few doses of Aleve or like med a month. Sisters says patient has chronic abdominal pain and chronic back pain and this is unchanged. Her BMs vary in consistency depending on diet. Grandfather had colon cancer in his 48's. Sister has history of colon polyps. Patient  had a bad experience with anesthesia once as a child and has been resistant to undergo procedures since. She was seen by [place] in 2017 for evaluation of dysphagia felt to be related largely to inattention to safe eating habits. Esophogram done and negative.    Past Medical History:  Diagnosis Date  . Anxiety   . Depression   . GERD (gastroesophageal reflux disease)   . Hyperlipidemia   . Mental developmental delay   . OCD (obsessive compulsive disorder)   . Osteoporosis   . Thyroid disease     Past Surgical History:  Procedure Laterality Date  . TONSILLECTOMY      Prior to Admission medications   Medication Sig Start Date End Date Taking? Authorizing Provider  escitalopram (LEXAPRO) 20 MG tablet Take 1 tablet (20 mg total) by mouth daily. 09/06/19  Yes Hassell Done, Shawntina-Margaret, FNP  levothyroxine (SYNTHROID) 50 MCG tablet Take 1 tablet (50 mcg total) by mouth daily. 09/06/19  Yes Martin, Robbye-Margaret, FNP  LORazepam (ATIVAN) 0.5 MG tablet Take 1 tablet (0.5 mg total) by mouth 3 (three) times daily. 09/06/19  Yes Martin, Minyon-Margaret, FNP  lovastatin (MEVACOR) 40 MG tablet Take 1 tablet (40 mg total) by mouth at bedtime. 09/06/19  Yes Martin, Nissa-Margaret, FNP  pantoprazole (PROTONIX) 40 MG tablet Take 1 tablet (40 mg total) by mouth daily. 09/06/19  Yes Martin, Kassie-Margaret, FNP  risperiDONE (RISPERDAL) 0.25 MG tablet TAKE 1 TABLET IN THE MORNING AND  2 TABLETS AT BEDTIME Patient taking differently: Take 0.25-0.5 mg by mouth See admin instructions. TAKE 0.25mg  IN THE MORNING AND 0.50mg  AT BEDTIME 09/06/19  Yes Hassell Done, Marylan-Margaret, FNP  aspirin EC 81 MG tablet Take 81 mg by mouth daily.    [provider]  benzonatate (TESSALON PERLES) 100 MG capsule Take 1 capsule (100 mg total) by mouth 3 (three) times daily as needed for cough. 07/07/18   Baruch Gouty, FNP  Calcium-Vitamin D-Vitamin K 500-100-40 MG-UNT-MCG CHEW Chew 1 tablet by mouth 2 (two) times daily.     [provider]    esomeprazole (NEXIUM) 40 MG capsule TAKE (1) CAPSULE DAILY 09/06/19   Chevis Pretty, FNP  nystatin cream (MYCOSTATIN) Apply to affected area 2 times daily 11/09/18   Chevis Pretty, FNP    Current Facility-Administered Medications  Medication Dose Route Frequency Provider Last Rate Last Admin  . 0.9 %  sodium chloride infusion (Manually program via Guardrails IV Fluids)   Intravenous Once Annita Brod, MD      . furosemide (LASIX) injection 20 mg  20 mg Intravenous Once Annita Brod, MD      . furosemide (LASIX) injection 20 mg  20 mg Intravenous Once Annita Brod, MD      . ondansetron [facility]) tablet 4 mg  4 mg Oral Q6H PRN Annita Brod, MD       Or  . ondansetron [facility]) injection 4 mg  4 mg Intravenous Q6H PRN Annita Brod, MD      . pantoprazole (PROTONIX) 80 mg in sodium chloride 0.9 % 100 mL (0.8 mg/mL) infusion  8 mg/hr Intravenous Continuous Rise Patience, MD      . Derrill Memo ON 11/03/2019] pantoprazole (PROTONIX) injection 40 mg  40 mg Intravenous Q12H Gean Birchwood N, MD      . sodium chloride flush (NS) 0.9 % injection 3 mL  3 mL Intravenous Q12H Annita Brod, MD        Allergies as of 10/31/2019 - Review Complete 10/31/2019  Allergen Reaction Noted  . Doxycycline Rash 03/01/2018    Family History  Problem Relation Age of Onset  . Diabetes Father   . Hyperlipidemia Father   . Hypertension Father   . Atrial fibrillation Father   . Hyperlipidemia Sister   . Dysphagia Sister   . GI problems Mother   . Heart disease Maternal Grandfather   . Colon cancer Maternal Grandfather     Social History   Socioeconomic History  . Marital status: Single    Spouse name: Not on file  . Number of children: 0  . Years of education: 15  . Highest education level: 12th grade  Occupational History  . Occupation: retired    Comment: Special educational needs teacher  Tobacco Use  . Smoking status: Never Smoker  . Smokeless tobacco: Never Used   Substance and Sexual Activity  . Alcohol use: No  . Drug use: No  . Sexual activity: Never  Other Topics Concern  . Not on file  Social History Narrative  . Not on file   Social Determinants of Health   Financial Resource Strain:   . Difficulty of Paying Living Expenses:   Food Insecurity:   . Worried About Charity fundraiser in the Last Year:   . Arboriculturist in the Last Year:   Transportation Needs:   . Film/video editor (Medical):   Marland Kitchen Lack of Transportation (Non-Medical):   Physical Activity:   .  Days of Exercise per Week:   . Minutes of Exercise per Session:   Stress:   . Feeling of Stress :   Social Connections:   . Frequency of Communication with Friends and Family:   . Frequency of Social Gatherings with Friends and Family:   . Attends Religious Services:   . Active Member of Clubs or Organizations:   . Attends Archivist Meetings:   Marland Kitchen Marital Status:   Intimate Partner Violence:   . Fear of Current or Ex-Partner:   . Emotionally Abused:   Marland Kitchen Physically Abused:   . Sexually Abused:     Review of Systems: Unobtainable. Patient admitted with confusion and she has developmental delay.   Physical Exam: Vital signs in last 24 hours: Temp:  [97.7 F (36.5 C)-98.9 F (37.2 C)] 98 F (36.7 C) (03/31 1015) Pulse Rate:  [84-95] 88 (03/31 0600) Resp:  [14-25] 20 (03/31 0600) BP: (115-147)/(56-67) 115/64 (03/31 1015) SpO2:  [97 %-100 %] 100 % (03/31 1015) Weight:  [90.3 kg-92.3 kg] 90.3 kg (03/31 0600) Last BM Date: 10/30/19 General:   Alert, obese female in NAD Psych:  Pleasant, cooperative. Normal mood and affect. Eyes:  Pupils equal, sclera clear, no icterus.   Conjunctiva pink. Ears:  Normal auditory acuity. Nose:  No deformity, discharge,  or lesions. Neck:  Supple; no masses Lungs:  Clear throughout to auscultation.   No wheezes, crackles, or rhonchi.  Heart:  Regular rate and rhythm;  no lower extremity edema Abdomen:  Soft,  non-distended, nontender, BS active, no palp mass   Rectal:  Deferred  Msk:  Symmetrical without gross deformities. . Neurologic:  Alert and  oriented x4;  grossly normal neurologically. Skin:  Intact without significant lesions or rashes.   Intake/Output from previous day: No intake/output data recorded. Intake/Output this shift: No intake/output data recorded.  Lab Results: Recent Labs    10/31/19 0134 10/31/19 0657  WBC 5.5 6.0  HGB 5.7* 5.2*  HCT 20.5* 19.2*  PLT 278 266   BMET Recent Labs    10/31/19 0134 10/31/19 0657  NA 137 140  K 4.0 3.9  CL 108 110  CO2 22 23  GLUCOSE 105* 102*  BUN 11 10  CREATININE 0.78 0.82  CALCIUM 8.4* 8.2*   LFT Recent Labs    10/31/19 0657  PROT 6.0*  ALBUMIN 3.2*  AST 27  ALT 20  ALKPHOS 116  BILITOT 0.6   PT/INR Recent Labs    10/31/19 0137  LABPROT 12.7  INR 1.0   Hepatitis Panel No results for input(s): HEPBSAG, HCVAB, HEPAIGM, HEPBIGM in the last 72 hours.   . CBC Latest Ref Rng & Units 10/31/2019 10/31/2019 03/26/2019  WBC 4.0 - 10.5 K/uL 6.0 5.5 WILL FOLLOW  Hemoglobin 12.0 - 15.0 g/dL 5.2(LL) 5.7(LL) WILL FOLLOW  Hematocrit 36.0 - 46.0 % 19.2(L) 20.5(L) WILL FOLLOW  Platelets 150 - 400 K/uL 266 278 WILL FOLLOW    . CMP Latest Ref Rng & Units 10/31/2019 10/31/2019 03/26/2019  Glucose 70 - 99 mg/dL 102(H) 105(H) 88  BUN 8 - 23 mg/dL 10 11 9   Creatinine 0.44 - 1.00 mg/dL 0.82 0.78 0.85  Sodium 135 - 145 mmol/L 140 137 143  Potassium 3.5 - 5.1 mmol/L 3.9 4.0 3.9  Chloride 98 - 111 mmol/L 110 108 106  CO2 22 - 32 mmol/L 23 22 23   Calcium 8.9 - 10.3 mg/dL 8.2(L) 8.4(L) 9.2  Total Protein 6.5 - 8.1 g/dL 6.0(L) 6.7 6.5  Total Bilirubin  0.3 - 1.2 mg/dL 0.6 0.8 0.3  Alkaline Phos 38 - 126 U/L 116 127(H) 147(H)  AST 15 - 41 U/L 27 31 20   ALT 0 - 44 U/L 20 21 14    Studies/Results: CT Head Wo Contrast  Result Date: 10/31/2019 CLINICAL DATA:  Fall several days ago with increased mental status change today EXAM:  CT HEAD WITHOUT CONTRAST TECHNIQUE: Contiguous axial images were obtained from the base of the skull through the vertex without intravenous contrast. COMPARISON:  03/27/2016 FINDINGS: Brain: Atrophic changes are noted. No findings to suggest acute hemorrhage, acute infarction or space-occupying mass lesion are noted. Vascular: No hyperdense vessel or unexpected calcification. Skull: Normal. Negative for fracture or focal lesion. Sinuses/Orbits: No acute finding. Other: None. IMPRESSION: Mild atrophic changes without acute abnormality. Electronically Signed   By: Inez Catalina M.D.   On: 10/31/2019 02:43    Principal Problem:   Acute GI bleeding Active Problems:   Hypothyroidism   Hyperlipidemia with target LDL less than 100   GAD (generalized anxiety disorder)   GERD (gastroesophageal reflux disease)   OCD (obsessive compulsive disorder)   Primary insomnia   Obesity (BMI 30-39.9)   Acute metabolic encephalopathy    Tye Savoy, NP-C @  10/31/2019, 10:20 AM

## 2019-11-01 ENCOUNTER — Inpatient Hospital Stay (HOSPITAL_COMMUNITY): Payer: Medicare Other | Admitting: Anesthesiology

## 2019-11-01 ENCOUNTER — Encounter (HOSPITAL_COMMUNITY)
Admission: EM | Disposition: A | Discharge status: Home / Self Care | Payer: Self-pay | Source: Home / Self Care | Attending: Internal Medicine

## 2019-11-01 ENCOUNTER — Encounter (HOSPITAL_COMMUNITY): Payer: Self-pay | Admitting: Internal Medicine

## 2019-11-01 DIAGNOSIS — K2100 Gastro-esophageal reflux disease with esophagitis, without bleeding: Secondary | ICD-10-CM

## 2019-11-01 DIAGNOSIS — F411 Generalized anxiety disorder: Secondary | ICD-10-CM

## 2019-11-01 DIAGNOSIS — K259 Gastric ulcer, unspecified as acute or chronic, without hemorrhage or perforation: Secondary | ICD-10-CM

## 2019-11-01 DIAGNOSIS — F428 Other obsessive-compulsive disorder: Secondary | ICD-10-CM

## 2019-11-01 DIAGNOSIS — E785 Hyperlipidemia, unspecified: Secondary | ICD-10-CM

## 2019-11-01 DIAGNOSIS — E669 Obesity, unspecified: Secondary | ICD-10-CM

## 2019-11-01 DIAGNOSIS — K253 Acute gastric ulcer without hemorrhage or perforation: Secondary | ICD-10-CM | POA: Diagnosis present

## 2019-11-01 DIAGNOSIS — R195 Other fecal abnormalities: Secondary | ICD-10-CM

## 2019-11-01 DIAGNOSIS — K449 Diaphragmatic hernia without obstruction or gangrene: Secondary | ICD-10-CM

## 2019-11-01 DIAGNOSIS — K922 Gastrointestinal hemorrhage, unspecified: Secondary | ICD-10-CM | POA: Diagnosis present

## 2019-11-01 DIAGNOSIS — F5101 Primary insomnia: Secondary | ICD-10-CM

## 2019-11-01 DIAGNOSIS — Z7189 Other specified counseling: Secondary | ICD-10-CM

## 2019-11-01 DIAGNOSIS — E038 Other specified hypothyroidism: Secondary | ICD-10-CM

## 2019-11-01 HISTORY — PX: ESOPHAGOGASTRODUODENOSCOPY (EGD) WITH PROPOFOL: SHX5813

## 2019-11-01 LAB — COMPREHENSIVE METABOLIC PANEL WITH GFR
ALT: 19 U/L (ref 0–44)
AST: 27 U/L (ref 15–41)
Albumin: 3.5 g/dL (ref 3.5–5.0)
Alkaline Phosphatase: 132 U/L — ABNORMAL HIGH (ref 38–126)
Anion gap: 11 (ref 5–15)
BUN: 7 mg/dL — ABNORMAL LOW (ref 8–23)
CO2: 21 mmol/L — ABNORMAL LOW (ref 22–32)
Calcium: 8.4 mg/dL — ABNORMAL LOW (ref 8.9–10.3)
Chloride: 109 mmol/L (ref 98–111)
Creatinine, Ser: 0.83 mg/dL (ref 0.44–1.00)
GFR calc Af Amer: >60 mL/min
GFR calc non Af Amer: >60 mL/min
Glucose, Bld: 92 mg/dL (ref 70–99)
Potassium: 3.5 mmol/L (ref 3.5–5.1)
Sodium: 141 mmol/L (ref 135–145)
Total Bilirubin: 1.7 mg/dL — ABNORMAL HIGH (ref 0.3–1.2)
Total Protein: 6.8 g/dL (ref 6.5–8.1)

## 2019-11-01 LAB — CBC
HCT: 31.1 % — ABNORMAL LOW (ref 36.0–46.0)
Hemoglobin: 9.1 g/dL — ABNORMAL LOW (ref 12.0–15.0)
MCH: 21.3 pg — ABNORMAL LOW (ref 26.0–34.0)
MCHC: 29.3 g/dL — ABNORMAL LOW (ref 30.0–36.0)
MCV: 72.8 fL — ABNORMAL LOW (ref 80.0–100.0)
Platelets: 287 10*3/uL (ref 150–400)
RBC: 4.27 MIL/uL (ref 3.87–5.11)
RDW: 20 % — ABNORMAL HIGH (ref 11.5–15.5)
WBC: 6.4 10*3/uL (ref 4.0–10.5)
nRBC: 0.3 % — ABNORMAL HIGH (ref 0.0–0.2)

## 2019-11-01 LAB — BPAM RBC
Blood Product Expiration Date: 202104182359
Blood Product Expiration Date: 202104202359
ISSUE DATE / TIME: 202103311021
ISSUE DATE / TIME: 202103311323
Unit Type and Rh: 9500
Unit Type and Rh: 9500

## 2019-11-01 LAB — TYPE AND SCREEN
ABO/RH(D): O NEG
Antibody Screen: NEGATIVE
Unit division: 0
Unit division: 0

## 2019-11-01 SURGERY — ESOPHAGOGASTRODUODENOSCOPY (EGD) WITH PROPOFOL
Anesthesia: Monitor Anesthesia Care

## 2019-11-01 SURGERY — EGD (ESOPHAGOGASTRODUODENOSCOPY)
Anesthesia: Moderate Sedation

## 2019-11-01 MED ORDER — PROPOFOL 500 MG/50ML IV EMUL
INTRAVENOUS | Status: DC | PRN
  Administered 2019-11-01: 130 ug/kg/min via INTRAVENOUS

## 2019-11-01 MED ORDER — METOCLOPRAMIDE HCL 5 MG/ML IJ SOLN
10.0000 mg | Freq: Once | INTRAMUSCULAR | Status: AC
  Administered 2019-11-01: 10 mg via INTRAVENOUS
  Filled 2019-11-01: qty 2

## 2019-11-01 MED ORDER — POLYETHYLENE GLYCOL 3350 17 GM/SCOOP PO POWD
0.5000 | Freq: Once | ORAL | Status: DC
  Filled 2019-11-01: qty 255

## 2019-11-01 MED ORDER — PROPOFOL 500 MG/50ML IV EMUL
INTRAVENOUS | Status: AC
  Filled 2019-11-01: qty 50

## 2019-11-01 MED ORDER — PROPOFOL 10 MG/ML IV BOLUS
INTRAVENOUS | Status: AC
  Filled 2019-11-01: qty 20

## 2019-11-01 MED ORDER — BISACODYL 5 MG PO TBEC
20.0000 mg | DELAYED_RELEASE_TABLET | Freq: Once | ORAL | Status: AC
  Administered 2019-11-01: 20 mg via ORAL
  Filled 2019-11-01: qty 4

## 2019-11-01 MED ORDER — SORBITOL 70 % SOLN
960.0000 mL | TOPICAL_OIL | Freq: Once | ORAL | Status: AC
  Administered 2019-11-01: 960 mL via RECTAL
  Filled 2019-11-01: qty 473

## 2019-11-01 MED ORDER — LACTATED RINGERS IV SOLN
INTRAVENOUS | Status: AC | PRN
  Administered 2019-11-01: 1000 mL via INTRAVENOUS

## 2019-11-01 MED ORDER — POLYETHYLENE GLYCOL 3350 17 GM/SCOOP PO POWD
0.5000 | Freq: Once | ORAL | Status: AC
  Administered 2019-11-01: 127.5 g via ORAL
  Filled 2019-11-01: qty 255

## 2019-11-01 MED ORDER — PANTOPRAZOLE SODIUM 40 MG PO TBEC
40.0000 mg | DELAYED_RELEASE_TABLET | Freq: Every day | ORAL | Status: DC
  Administered 2019-11-02: 40 mg via ORAL
  Filled 2019-11-01: qty 1

## 2019-11-01 MED ORDER — SODIUM CHLORIDE 0.9 % IV SOLN: INTRAVENOUS | Status: DC

## 2019-11-01 SURGICAL SUPPLY — 25 items

## 2019-11-01 NOTE — Progress Notes (Signed)
Pt post Enemas given and PO prep started has had several moderate to large incontinent bowel movements. Color is very dark brown to black. Stool remains with some form stool with liquid stool.

## 2019-11-01 NOTE — Interval H&P Note (Signed)
History and Physical Interval Note:  11/01/2019 10:33 AM  Renee Davidson  has presented today for surgery, with the diagnosis of iron deficiency anemia, hemocult positive stool.  The various methods of treatment have been discussed with the patient and family. After consideration of risks, benefits and other options for treatment, the patient has consented to  Procedure(s): COLONOSCOPY WITH PROPOFOL (N/A) ESOPHAGOGASTRODUODENOSCOPY (EGD) WITH PROPOFOL (N/A) as a surgical intervention.  The patient's history has been reviewed, patient examined, no change in status, stable for surgery.  I have reviewed the patient's chart and labs.  Questions were answered to the patient's satisfaction.     Silvano Rusk

## 2019-11-01 NOTE — Transfer of Care (Signed)
Immediate Anesthesia Transfer of Care Note  Patient: Renee Davidson  Procedure(s) Performed: ESOPHAGOGASTRODUODENOSCOPY (EGD) WITH PROPOFOL (N/A )  Patient Location: PACU and Endoscopy Unit  Anesthesia Type:MAC  Level of Consciousness: awake, alert , oriented and patient cooperative  Airway & Oxygen Therapy: Patient Spontanous Breathing and Patient connected to face mask oxygen  Post-op Assessment: Report given to RN, Post -op Vital signs reviewed and stable and Patient moving all extremities  Post vital signs: Reviewed and stable  Last Vitals:  Vitals Value Taken Time  BP 128/63 11/01/19 1103  Temp    Pulse    Resp 15 11/01/19 1107  SpO2    Vitals shown include unvalidated device data.  Last Pain:  Vitals:   11/01/19 1005  TempSrc: Axillary  PainSc:          Complications: No apparent anesthesia complications

## 2019-11-01 NOTE — Anesthesia Preprocedure Evaluation (Signed)
Anesthesia Evaluation  Patient identified by MRN, date of birth, ID band Patient awake    Reviewed: Allergy & Precautions, H&P , NPO status , Patient's Chart, lab work & pertinent test results  Airway Mallampati: II   Neck ROM: full    Dental   Pulmonary neg pulmonary ROS,    breath sounds clear to auscultation       Cardiovascular negative cardio ROS   Rhythm:regular Rate:Normal     Neuro/Psych PSYCHIATRIC DISORDERS Anxiety Depression    GI/Hepatic GERD  ,  Endo/Other  Hypothyroidism   Renal/GU      Musculoskeletal   Abdominal   Peds  Hematology  (+) Blood dyscrasia, anemia ,   Anesthesia Other Findings   Reproductive/Obstetrics                             Anesthesia Physical Anesthesia Plan  ASA: II  Anesthesia Plan: MAC   Post-op Pain Management:    Induction: Intravenous  PONV Risk Score and Plan: 2 and Propofol infusion and Treatment may vary due to age or medical condition  Airway Management Planned: Nasal Cannula  Additional Equipment:   Intra-op Plan:   Post-operative Plan:   Informed Consent: I have reviewed the patients History and Physical, chart, labs and discussed the procedure including the risks, benefits and alternatives for the proposed anesthesia with the patient or authorized representative who has indicated his/her understanding and acceptance.       Plan Discussed with: CRNA, Anesthesiologist and Surgeon  Anesthesia Plan Comments:         Anesthesia Quick Evaluation

## 2019-11-01 NOTE — Op Note (Signed)
[facility] Patient Name: Renee Davidson Procedure Date: 11/01/2019 MRN: [id number] Attending MD: Gatha Mayer , MD Date of Birth: Aug 12, 1950 CSN: [id number] Age: 69 Admit Type: Inpatient Procedure:                Upper GI endoscopy Indications:              Iron deficiency anemia, Heme positive stool Providers:                Gatha Mayer, MD, Cleda Daub, RN, Laverda Sorenson, Technician, Courtney Heys. [place], CRNA Referring MD:              Medicines:                Propofol per Anesthesia, Monitored Anesthesia Care Complications:            No immediate complications. Estimated Blood Loss:     Estimated blood loss: none. Procedure:                Pre-Anesthesia Assessment:                           - Prior to the procedure, a History and Physical                            was performed, and patient medications and                            allergies were reviewed. The patient's tolerance of                            previous anesthesia was also reviewed. The risks                            and benefits of the procedure and the sedation                            options and risks were discussed with the patient.                            All questions were answered, and informed consent                            was obtained. Prior Anticoagulants: The patient has                            taken no previous anticoagulant or antiplatelet                            agents. ASA Grade Assessment: III - A patient with                            severe systemic disease. After reviewing the risks  and benefits, the patient was deemed in                            satisfactory condition to undergo the procedure.                           After obtaining informed consent, the endoscope was                            passed under direct vision. Throughout the                            procedure, the patient's blood  pressure, pulse, and                            oxygen saturations were monitored continuously. The                            [id number] Olympus gastroscope was                            introduced through the mouth, and advanced to the                            second part of duodenum. The upper GI endoscopy was                            accomplished without difficulty. The patient                            tolerated the procedure well. Scope In: Scope Out: Findings:      A 10 cm hiatal hernia with a single Cameron ulcer was found. The hiatal       narrowing was 45 cm from the incisors. The Z-line was 35 cm from the       incisors.      The exam was otherwise without abnormality.      The cardia and gastric fundus were normal on retroflexion. Impression:               - 10 cm hiatal hernia with a single Cameron ulcer.                            this could be cause of iron deficiency anemia                           - The examination was otherwise normal.                           - No specimens collected. Moderate Sedation:      Not Applicable - Patient had care per Anesthesia. Recommendation:           - Return patient to hospital ward for ongoing care.                           -  Clear liquid diet.                           - She had formed stool in rectum so intended                            colonoscopy not performed.                           will repeat preparation and try again tomorrow                            (colonoscopy)                           sisters updated - she still has some                            dysphagia/regurgitation which PPi has greatly                            improved - may need to do an upper GI series                            (outpatient would be fine) to check for complicated                            hernia though I did not see any sign of                            paraesophageal component Procedure Code(s):        ---  Professional ---                           [phone], Esophagogastroduodenoscopy, flexible,                            transoral; diagnostic, including collection of                            specimen(s) by brushing or washing, when performed                            (separate procedure) Diagnosis Code(s):        --- Professional ---                           K44.9, Diaphragmatic hernia without obstruction or                            gangrene                           K25.9, Gastric ulcer, unspecified as acute or  chronic, without hemorrhage or perforation                           D50.9, Iron deficiency anemia, unspecified                           R19.5, Other fecal abnormalities CPT copyright 2019 [employer]. All rights reserved. The codes documented in this report are preliminary and upon coder review may  be revised to meet current compliance requirements. Gatha Mayer, MD 11/01/2019 11:08:16 AM This report has been signed electronically. Number of Addenda: 0

## 2019-11-01 NOTE — Progress Notes (Addendum)
PROGRESS NOTE    Renee Davidson  [id number] DOB: [birth year] DOA: 10/31/2019 PCP: Chevis Pretty, FNP     Brief Narrative:  69 y.o. WF PMHx mild developmental delay, Anxiety, Depression, OCD, Hypothyroidism, HLD,  Who was sent over from her independent living after reports of confusion for the past 24 hours.  Patient did not know exactly where she was or her age, which was different from her baseline.  ED Course: In the emergency room, patient was noted to have a stable blood pressure and heart rate, no fever.  Confusion was confirmed in interview.  Most concerning however was her lab work which noted a hemoglobin of 5.7 with an MCV of 68.  Further anemia panel noted iron deficiency anemia.  Patient did not give any history of bloody stools, hematemesis, change in bowel habits other than some increasing loose stools in the past week or so.  She also noted no weight loss in the last 6 months.  Hospitalist were called for further evaluation and patient was transferred to [facility] and following arrival, 2 units packed red blood cells were ordered.  GI was consulted.   Subjective: Sleepy but arousable.  A/O x2 (does not know where, why).  Negative abdominal pain.  Negative N/V, negative CP.   Assessment & Plan:   Principal Problem:   Acute GI bleeding Active Problems:   Hypothyroidism   Hyperlipidemia with target LDL less than 100   GAD (generalized anxiety disorder)   GERD (gastroesophageal reflux disease)   OCD (obsessive compulsive disorder)   Primary insomnia   Obesity (BMI 30-39.9)   Acute metabolic encephalopathy   Iron deficiency anemia   Family history of colon cancer   Family history of colonic polyps   Symptomatic anemia   Mental developmental delay   Advance directive discussed with patient   Heme + stool   Acute upper GI bleed   Hiatal hernia   Lysbeth Galas lesion, acute  Symptomatic GI bleed -4/1 transfuse 2 units PRBC -4/1 s/p EGD; ulcer with  hiatal hernia see results below -Unable to perform colonoscopy secondary to patient's poor prep -4/1 SMOG enema + H2O enema per GI + bowel prep per GI and noted to fully clean patient out for colonoscopy in a.m.  Hiatal hernia/Cameron ulcer -4/1 s/p EGD per GI could be source of iron deficiency anemia.  See results below.   GERD  Hypothyroidism  HLD  Primary insomnia  Obesity (BMI 30-39.9)  Mental developmental delay     DVT prophylaxis: SCD Code Status: Full Family Communication: 4/1 sister at bedside counseled on plan of care answered all questions Disposition Plan: Per GI 1.  Where the patient is from 2.  Anticipated d/c place. 3.  Barriers to d/c per GI   Consultants:  GI   Procedures/Significant Events:  4/1 Upper GI endoscopy; - 10 cm hiatal hernia with a single Cameron ulcer. this could be cause of iron deficiency anemia  - No specimens collected. 4/1 transfuse 2 units PRBC  I have personally reviewed and interpreted all radiology studies and my findings are as above.  VENTILATOR SETTINGS:    Cultures   Antimicrobials: Anti-infectives (From admission, onward)   None       Devices    LINES / TUBES:      Continuous Infusions: . sodium chloride 20 mL/hr at 11/01/19 1300     Objective: Vitals:   11/01/19 1303 11/01/19 1659 11/01/19 1757 11/01/19 2037  BP: 125/65 124/71 123/64 (!) 113/59  Pulse:  64 62 60 62  Resp: 16 14 16 17   Temp: 97.6 F (36.4 C) 98.2 F (36.8 C) 98.6 F (37 C) 97.9 F (36.6 C)  TempSrc: Oral Oral Oral Oral  SpO2: 100% 99% 99% 100%  Weight:      Height:        Intake/Output Summary (Last 24 hours) at 11/01/2019 2121 Last data filed at 11/01/2019 1700 Gross per 24 hour  Intake 1975.8 ml  Output 900 ml  Net 1075.8 ml   Filed Weights   10/31/19 0128 10/31/19 0600 11/01/19 1005  Weight: 92.3 kg 90.3 kg 90.3 kg    Examination:  General: A/O x2 (does not know where, why), no acute respiratory  distress Eyes: negative scleral hemorrhage, negative anisocoria, negative icterus, positive bilateral eyelid erythema mostly midline ENT: Negative Runny nose, negative gingival bleeding, poor dentition Neck:  Negative scars, masses, torticollis, lymphadenopathy, JVD Lungs: Clear to auscultation bilaterally without wheezes or crackles Cardiovascular: Regular rate and rhythm without murmur gallop or rub normal S1 and S2 Abdomen: OBESE, negative abdominal pain, nondistended, positive soft, bowel sounds, no rebound, no ascites, no appreciable mass Extremities: No significant cyanosis, clubbing, or edema bilateral lower extremities Skin: Negative rashes, lesions, ulcers Psychiatric: Unable to fully evaluate some developmental delay Central nervous system:  Cranial nerves II through XII intact, tongue/uvula midline, all extremities muscle strength 5/5, sensation intact throughout, negative dysarthria, negative expressive aphasia, negative receptive aphasia.  .     Data Reviewed: Care during the described time interval was provided by me .  I have reviewed this patient's available data, including medical history, events of note, physical examination, and all test results as part of my evaluation.  CBC: Recent Labs  Lab 10/31/19 0134 10/31/19 0657 10/31/19 1827 11/01/19 0306  WBC 5.5 6.0  --  6.4  NEUTROABS 3.6 4.2  --   --   HGB 5.7* 5.2* 9.4* 9.1*  HCT 20.5* 19.2* 31.8* 31.1*  MCV 68.1* 68.6*  --  72.8*  PLT 278 266  --  [id number]   Basic Metabolic Panel: Recent Labs  Lab 10/31/19 0134 10/31/19 0657 11/01/19 0306  NA 137 140 141  K 4.0 3.9 3.5  CL 108 110 109  CO2 22 23 21*  GLUCOSE 105* 102* 92  BUN 11 10 7*  CREATININE 0.78 0.82 0.83  CALCIUM 8.4* 8.2* 8.4*   GFR: Estimated Creatinine Clearance: 70 mL/min (by C-G formula based on SCr of 0.83 mg/dL). Liver Function Tests: Recent Labs  Lab 10/31/19 0134 10/31/19 0657 11/01/19 0306  AST 31 27 27   ALT 21 20 19   ALKPHOS 127*  116 132*  BILITOT 0.8 0.6 1.7*  PROT 6.7 6.0* 6.8  ALBUMIN 3.4* 3.2* 3.5   No results for input(s): LIPASE, AMYLASE in the last 168 hours. No results for input(s): AMMONIA in the last 168 hours. Coagulation Profile: Recent Labs  Lab 10/31/19 0137  INR 1.0   Cardiac Enzymes: No results for input(s): CKTOTAL, CKMB, CKMBINDEX, TROPONINI in the last 168 hours. BNP (last 3 results) No results for input(s): PROBNP in the last 8760 hours. HbA1C: No results for input(s): HGBA1C in the last 72 hours. CBG: Recent Labs  Lab 10/31/19 2130  GLUCAP 86   Lipid Profile: No results for input(s): CHOL, HDL, LDLCALC, TRIG, CHOLHDL, LDLDIRECT in the last 72 hours. Thyroid Function Tests: Recent Labs    10/31/19 0940  TSH 2.407   Anemia Panel: Recent Labs    10/31/19 0657  VITAMINB12 213  FOLATE 11.6  FERRITIN 6*  TIBC 564*  IRON 10*  RETICCTPCT 2.2   Sepsis Labs: No results for input(s): PROCALCITON, LATICACIDVEN in the last 168 hours.  Recent Results (from the past 240 hour(s))  Respiratory Panel by RT PCR (Flu A&B, Covid) - Nasopharyngeal Swab     Status: None   Collection Time: 10/31/19  2:37 AM   Specimen: Nasopharyngeal Swab  Result Value Ref Range Status   SARS Coronavirus 2 by RT PCR NEGATIVE NEGATIVE Final    Comment: (NOTE) SARS-CoV-2 target nucleic acids are NOT DETECTED. The SARS-CoV-2 RNA is generally detectable in upper respiratoy specimens during the acute phase of infection. The lowest concentration of SARS-CoV-2 viral copies this assay can detect is 131 copies/mL. A negative result does not preclude SARS-Cov-2 infection and should not be used as the sole basis for treatment or other patient management decisions. A negative result may occur with  improper specimen collection/handling, submission of specimen other than nasopharyngeal swab, presence of viral mutation(s) within the areas targeted by this assay, and inadequate number of viral copies (<131  copies/mL). A negative result must be combined with clinical observations, patient history, and epidemiological information. The expected result is Negative. Fact Sheet for Patients:  PinkCheek.be Fact Sheet for Healthcare Providers:  GravelBags.it This test is not yet ap proved or cleared by the [place] FDA and  has been authorized for detection and/or diagnosis of SARS-CoV-2 by FDA under an Emergency Use Authorization (EUA). This EUA will remain  in effect (meaning this test can be used) for the duration of the COVID-19 declaration under Section 564(b)(1) of the Act, 21 U.S.C. section 360bbb-3(b)(1), unless the authorization is terminated or revoked sooner.    Influenza A by PCR NEGATIVE NEGATIVE Final   Influenza B by PCR NEGATIVE NEGATIVE Final    Comment: (NOTE) The Xpert Xpress SARS-CoV-2/FLU/RSV assay is intended as an aid in  the diagnosis of influenza from Nasopharyngeal swab specimens and  should not be used as a sole basis for treatment. Nasal washings and  aspirates are unacceptable for Xpert Xpress SARS-CoV-2/FLU/RSV  testing. Fact Sheet for Patients: PinkCheek.be Fact Sheet for Healthcare Providers: GravelBags.it This test is not yet approved or cleared by the [place] FDA and  has been authorized for detection and/or diagnosis of SARS-CoV-2 by  FDA under an Emergency Use Authorization (EUA). This EUA will remain  in effect (meaning this test can be used) for the duration of the  Covid-19 declaration under Section 564(b)(1) of the Act, 21  U.S.C. section 360bbb-3(b)(1), unless the authorization is  terminated or revoked. Performed at [facility], [address] [place], [place] [address]          Radiology Studies: CT Head Wo Contrast  Result Date: 10/31/2019 CLINICAL DATA:  Fall several days ago with increased mental  status change today EXAM: CT HEAD WITHOUT CONTRAST TECHNIQUE: Contiguous axial images were obtained from the base of the skull through the vertex without intravenous contrast. COMPARISON:  03/27/2016 FINDINGS: Brain: Atrophic changes are noted. No findings to suggest acute hemorrhage, acute infarction or space-occupying mass lesion are noted. Vascular: No hyperdense vessel or unexpected calcification. Skull: Normal. Negative for fracture or focal lesion. Sinuses/Orbits: No acute finding. Other: None. IMPRESSION: Mild atrophic changes without acute abnormality. Electronically Signed   By: Inez Catalina M.D.   On: 10/31/2019 02:43        Scheduled Meds: . escitalopram  20 mg Oral Daily  . levothyroxine  50 mcg Oral QAC  breakfast  . LORazepam  0.5 mg Oral TID  . [START ON 11/02/2019] pantoprazole  40 mg Oral QAC breakfast  . polyethylene glycol powder  0.5 Container Oral Once  . risperiDONE  0.25 mg Oral Daily  . risperiDONE  0.5 mg Oral QHS  . sodium chloride flush  3 mL Intravenous Q12H   Continuous Infusions: . sodium chloride 20 mL/hr at 11/01/19 1300     LOS: 1 day    Time spent:40 min    Zyanna Leisinger, Geraldo Docker, MD [facility] Pager [phone]  If 7PM-7AM, please contact night-coverage [email or web address] Password [facility] 11/01/2019, 9:21 PM

## 2019-11-01 NOTE — Progress Notes (Signed)
Per orders Tap water ordered and RN attempted to give tap water enema but water would not go in. RN rectally checked Pt and moderate of formed black stool removed manually. RN then was able to administer tap water enema as ordered.

## 2019-11-02 ENCOUNTER — Inpatient Hospital Stay (HOSPITAL_COMMUNITY): Payer: Medicare Other | Admitting: Registered Nurse

## 2019-11-02 ENCOUNTER — Encounter (HOSPITAL_COMMUNITY): Payer: Self-pay | Admitting: Internal Medicine

## 2019-11-02 ENCOUNTER — Encounter (HOSPITAL_COMMUNITY)
Admission: EM | Disposition: A | Discharge status: Home / Self Care | Payer: Self-pay | Source: Home / Self Care | Attending: Internal Medicine

## 2019-11-02 DIAGNOSIS — R195 Other fecal abnormalities: Secondary | ICD-10-CM

## 2019-11-02 DIAGNOSIS — K5790 Diverticulosis of intestine, part unspecified, without perforation or abscess without bleeding: Secondary | ICD-10-CM

## 2019-11-02 DIAGNOSIS — D500 Iron deficiency anemia secondary to blood loss (chronic): Secondary | ICD-10-CM

## 2019-11-02 DIAGNOSIS — K633 Ulcer of intestine: Secondary | ICD-10-CM

## 2019-11-02 HISTORY — PX: COLONOSCOPY WITH PROPOFOL: SHX5780

## 2019-11-02 HISTORY — PX: BIOPSY: SHX5522

## 2019-11-02 LAB — CBC WITH DIFFERENTIAL/PLATELET
Abs Immature Granulocytes: 0.03 10*3/uL (ref 0.00–0.07)
Basophils Absolute: 0 10*3/uL (ref 0.0–0.1)
Basophils Relative: 1 %
Eosinophils Absolute: 0.2 10*3/uL (ref 0.0–0.5)
Eosinophils Relative: 4 %
HCT: 31.3 % — ABNORMAL LOW (ref 36.0–46.0)
Hemoglobin: 9.3 g/dL — ABNORMAL LOW (ref 12.0–15.0)
Immature Granulocytes: 1 %
Lymphocytes Relative: 18 %
Lymphs Abs: 1.2 10*3/uL (ref 0.7–4.0)
MCH: 21.7 pg — ABNORMAL LOW (ref 26.0–34.0)
MCHC: 29.7 g/dL — ABNORMAL LOW (ref 30.0–36.0)
MCV: 73.1 fL — ABNORMAL LOW (ref 80.0–100.0)
Monocytes Absolute: 0.6 10*3/uL (ref 0.1–1.0)
Monocytes Relative: 10 %
Neutro Abs: 4.5 10*3/uL (ref 1.7–7.7)
Neutrophils Relative %: 66 %
Platelets: 293 10*3/uL (ref 150–400)
RBC: 4.28 MIL/uL (ref 3.87–5.11)
RDW: 20.5 % — ABNORMAL HIGH (ref 11.5–15.5)
WBC: 6.6 10*3/uL (ref 4.0–10.5)
nRBC: 0 % (ref 0.0–0.2)

## 2019-11-02 LAB — COMPREHENSIVE METABOLIC PANEL
ALT: 19 U/L (ref 0–44)
AST: 30 U/L (ref 15–41)
Albumin: 3.2 g/dL — ABNORMAL LOW (ref 3.5–5.0)
Alkaline Phosphatase: 124 U/L (ref 38–126)
Anion gap: 10 (ref 5–15)
BUN: 6 mg/dL — ABNORMAL LOW (ref 8–23)
CO2: 21 mmol/L — ABNORMAL LOW (ref 22–32)
Calcium: 8.1 mg/dL — ABNORMAL LOW (ref 8.9–10.3)
Chloride: 110 mmol/L (ref 98–111)
Creatinine, Ser: 0.62 mg/dL (ref 0.44–1.00)
GFR calc Af Amer: >60 mL/min (ref >60)
GFR calc non Af Amer: >60 mL/min (ref >60)
Glucose, Bld: 99 mg/dL (ref 70–99)
Potassium: 3.2 mmol/L — ABNORMAL LOW (ref 3.5–5.1)
Sodium: 141 mmol/L (ref 135–145)
Total Bilirubin: 0.9 mg/dL (ref 0.3–1.2)
Total Protein: 6.2 g/dL — ABNORMAL LOW (ref 6.5–8.1)

## 2019-11-02 LAB — MAGNESIUM: Magnesium: 2.2 mg/dL (ref 1.7–2.4)

## 2019-11-02 LAB — PHOSPHORUS: Phosphorus: 3.1 mg/dL (ref 2.5–4.6)

## 2019-11-02 SURGERY — COLONOSCOPY WITH PROPOFOL
Anesthesia: Monitor Anesthesia Care

## 2019-11-02 MED ORDER — FERROUS SULFATE 325 (65 FE) MG PO TABS: 325.0000 mg | ORAL_TABLET | Freq: Every day | ORAL | Status: DC

## 2019-11-02 MED ORDER — LIDOCAINE 2% (20 MG/ML) 5 ML SYRINGE
INTRAMUSCULAR | Status: DC | PRN
  Administered 2019-11-02: 60 mg via INTRAVENOUS

## 2019-11-02 MED ORDER — LACTATED RINGERS IV SOLN: INTRAVENOUS | Status: DC | PRN

## 2019-11-02 MED ORDER — ONDANSETRON HCL 4 MG PO TABS: 4.0000 mg | ORAL_TABLET | Freq: Four times a day (QID) | ORAL | 0 refills | Status: DC | PRN

## 2019-11-02 MED ORDER — PANTOPRAZOLE SODIUM 40 MG PO TBEC: 40.0000 mg | DELAYED_RELEASE_TABLET | Freq: Every day | ORAL | 0 refills | Status: DC

## 2019-11-02 MED ORDER — FERROUS SULFATE 325 (65 FE) MG PO TABS: 325.0000 mg | ORAL_TABLET | Freq: Every day | ORAL | 0 refills | Status: DC

## 2019-11-02 MED ORDER — ASCORBIC ACID 500 MG PO TABS: 500.0000 mg | ORAL_TABLET | Freq: Every day | ORAL | Status: DC

## 2019-11-02 MED ORDER — PROPOFOL 10 MG/ML IV BOLUS
INTRAVENOUS | Status: DC | PRN
  Administered 2019-11-02: 10 mg via INTRAVENOUS
  Administered 2019-11-02: 20 mg via INTRAVENOUS

## 2019-11-02 MED ORDER — SODIUM CHLORIDE 0.9 % IV SOLN
510.0000 mg | Freq: Once | INTRAVENOUS | Status: AC
  Administered 2019-11-02: 510 mg via INTRAVENOUS
  Filled 2019-11-02: qty 510

## 2019-11-02 MED ORDER — PROPOFOL 500 MG/50ML IV EMUL
INTRAVENOUS | Status: DC | PRN
  Administered 2019-11-02: 125 ug/kg/min via INTRAVENOUS

## 2019-11-02 MED ORDER — ASCORBIC ACID 500 MG PO TABS: 500.0000 mg | ORAL_TABLET | Freq: Every day | ORAL | 0 refills | Status: DC

## 2019-11-02 MED ORDER — PROPOFOL 500 MG/50ML IV EMUL
INTRAVENOUS | Status: AC
  Filled 2019-11-02: qty 50

## 2019-11-02 SURGICAL SUPPLY — 21 items

## 2019-11-02 NOTE — Discharge Summary (Signed)
Physician Discharge Summary  Renee Davidson [id number] DOB: 1951-06-29 DOA: 10/31/2019  PCP: Chevis Pretty, FNP  Admit date: 10/31/2019 Discharge date: 11/06/2019  Time spent: 30 minutes  Recommendations for Outpatient Follow-up:    Symptomatic GI bleed -4/1 transfuse 2 units PRBC -4/1 s/p EGD; ulcer with hiatal hernia see results below -4/2 s/p colonoscopy; see results below -See diverticulosis  Hiatal hernia/Cameron ulcer -4/1 s/p EGD per GI could be source of iron deficiency anemia.  See results below. -Per GI anemia felt to be secondary to hiatal hernia and Lysbeth Galas lesion -Feraheme x1 dose prior to discharge -Ferrous sulfate 325 mg daily -Vitamin C 500 mg daily -Protonix 40 mg daily  Diverticulosis/ileocecal valve ulcer -4/2 s/p Colonoscopy; diagnosed with single ulcer, and diverticulosis see results below -Resume regular diet -Patient to follow-up 1 to 2 weeks ulcer biopsy with Dr. Loletha Carrow who is her primary GI physician   GERD -See hiatal hernia  Hypothyroidism -Synthroid 50 mcg daily  HLD -Lovastatin 40 mg daily  Primary insomnia -Continue home meds  Obesity (BMI 30-39.9)   Mental developmental delay  -Stable   Discharge Diagnoses:  Principal Problem:   Acute GI bleeding Active Problems:   Hypothyroidism   Hyperlipidemia with target LDL less than 100   GAD (generalized anxiety disorder)   GERD (gastroesophageal reflux disease)   OCD (obsessive compulsive disorder)   Primary insomnia   Obesity (BMI 30-39.9)   Acute metabolic encephalopathy   Iron deficiency anemia   Family history of colon cancer   Family history of colonic polyps   Symptomatic anemia   Mental developmental delay   Advance directive discussed with patient   Heme + stool   Acute upper GI bleed   Hiatal hernia   Cameron lesion, acute   Ileum ulcer   Diverticulosis   Discharge Condition: Stable  Diet recommendation: Regular  Filed Weights   10/31/19 0600  11/01/19 1005 11/02/19 1108  Weight: 90.3 kg 90.3 kg 90.3 kg    History of present illness:  69 y.o.WF PMHx mild developmental delay, Anxiety, Depression, OCD, Hypothyroidism, HLD,  Who was sent over from her independent living after reports of confusion for the past 24 hours. Patient did not know exactly where she was or her age, which was different from her baseline.  ED Course:In the emergency room, patient was noted to have a stable blood pressure and heart rate, no fever. Confusion was confirmed in interview. Most concerning however was her lab work which noted a hemoglobin of 5.7 with an MCV of 68. Further anemia panel noted iron deficiency anemia. Patient did not give any history of bloody stools, hematemesis, change in bowel habits other than some increasing loose stools in the past week or so. She also noted no weight loss in the last 6 months. Hospitalist were called for further evaluation and patient was transferred to [facility] and following arrival, 2 units packed red blood cells were ordered. GI was consulted.   Hospital Course:  Admitted for GI bleed patient found to have a Cameron ulcer, diverticulosis, ileocecal valve ulcer.  Currently no further bleeding episodes stable for discharge.   Procedures: 4/1 Upper GI endoscopy;- 10 cm hiatal hernia with a single Cameron ulcer.this could be cause of iron deficiency anemia - No specimens collected. 4/1 transfuse 2 units PRBC 4/2 Colonoscopy;- Decreased sphincter tone found on digital rectal exam. - A single (solitary) ulcer at the ileocecal valve.  Biopsied. - The examined portion of the ileum was normal. - Diverticulosis in  the sigmoid colon and in the descending colon. - The examination was otherwise normal on direct  and retroflexion views.    Consultations: GI  Culture -4/2 biopsy ileocecal valve ulcer pending   Discharge Exam: Vitals:   11/02/19 1240 11/02/19 1245 11/02/19 1305 11/02/19 1548   BP: (!) 121/48 93/65 (!) 106/58 (!) 101/44  Pulse: 78 73 60 66  Resp: 12 17 16 16   Temp:   97.7 F (36.5 C) 97.7 F (36.5 C)  TempSrc:   Axillary Axillary  SpO2: 97% 95% 96% 98%  Weight:      Height:        General: A/O x2 (does not know where, why), no acute respiratory distress Eyes: negative scleral hemorrhage, negative anisocoria, negative icterus, positive bilateral eyelid erythema mostly midline ENT: Negative Runny nose, negative gingival bleeding, poor dentition Neck:  Negative scars, masses, torticollis, lymphadenopathy, JVD Lungs: Clear to auscultation bilaterally without wheezes or crackles Cardiovascular: Regular rate and rhythm without murmur gallop or rub normal S1 and S2 Abdomen: OBESE, negative abdominal pain, nondistended, positive soft, bowel sounds, no rebound, no ascites, no appreciable mass   Discharge Instructions   Allergies as of 11/02/2019      Reactions   Doxycycline Rash      Medication List    STOP taking these medications   aspirin EC 81 MG tablet   esomeprazole 40 MG capsule Commonly known as: NEXIUM     TAKE these medications   ascorbic acid 500 MG tablet Commonly known as: VITAMIN C Take 1 tablet (500 mg total) by mouth daily.   benzonatate 100 MG capsule Commonly known as: Tessalon Perles Take 1 capsule (100 mg total) by mouth 3 (three) times daily as needed for cough.   Calcium-Vitamin D-Vitamin K 500-100-40 MG-UNT-MCG Chew Chew 1 tablet by mouth 2 (two) times daily.   escitalopram 20 MG tablet Commonly known as: LEXAPRO Take 1 tablet (20 mg total) by mouth daily.   ferrous sulfate 325 (65 FE) MG tablet Take 1 tablet (325 mg total) by mouth daily with breakfast.   levothyroxine 50 MCG tablet Commonly known as: SYNTHROID Take 1 tablet (50 mcg total) by mouth daily.   LORazepam 0.5 MG tablet Commonly known as: ATIVAN Take 1 tablet (0.5 mg total) by mouth 3 (three) times daily.   lovastatin 40 MG tablet Commonly known as:  MEVACOR Take 1 tablet (40 mg total) by mouth at bedtime.   nystatin cream Commonly known as: MYCOSTATIN Apply to affected area 2 times daily   ondansetron 4 MG tablet Commonly known as: ZOFRAN Take 1 tablet (4 mg total) by mouth every 6 (six) hours as needed for nausea.   pantoprazole 40 MG tablet Commonly known as: PROTONIX Take 1 tablet (40 mg total) by mouth daily before breakfast. What changed: when to take this   risperiDONE 0.25 MG tablet Commonly known as: RISPERDAL TAKE 1 TABLET IN THE MORNING AND 2 TABLETS AT BEDTIME What changed:   how much to take  how to take this  when to take this  additional instructions      Allergies  Allergen Reactions  . Doxycycline Rash      The results of significant diagnostics from this hospitalization (including imaging, microbiology, ancillary and laboratory) are listed below for reference.    Significant Diagnostic Studies: CT Head Wo Contrast  Result Date: 10/31/2019 CLINICAL DATA:  Fall several days ago with increased mental status change today EXAM: CT HEAD WITHOUT CONTRAST TECHNIQUE: Contiguous axial images were  obtained from the base of the skull through the vertex without intravenous contrast. COMPARISON:  03/27/2016 FINDINGS: Brain: Atrophic changes are noted. No findings to suggest acute hemorrhage, acute infarction or space-occupying mass lesion are noted. Vascular: No hyperdense vessel or unexpected calcification. Skull: Normal. Negative for fracture or focal lesion. Sinuses/Orbits: No acute finding. Other: None. IMPRESSION: Mild atrophic changes without acute abnormality. Electronically Signed   By: Inez Catalina M.D.   On: 10/31/2019 02:43    Microbiology: Recent Results (from the past 240 hour(s))  Respiratory Panel by RT PCR (Flu A&B, Covid) - Nasopharyngeal Swab     Status: None   Collection Time: 10/31/19  2:37 AM   Specimen: Nasopharyngeal Swab  Result Value Ref Range Status   SARS Coronavirus 2 by RT PCR  NEGATIVE NEGATIVE Final    Comment: (NOTE) SARS-CoV-2 target nucleic acids are NOT DETECTED. The SARS-CoV-2 RNA is generally detectable in upper respiratoy specimens during the acute phase of infection. The lowest concentration of SARS-CoV-2 viral copies this assay can detect is 131 copies/mL. A negative result does not preclude SARS-Cov-2 infection and should not be used as the sole basis for treatment or other patient management decisions. A negative result may occur with  improper specimen collection/handling, submission of specimen other than nasopharyngeal swab, presence of viral mutation(s) within the areas targeted by this assay, and inadequate number of viral copies (<131 copies/mL). A negative result must be combined with clinical observations, patient history, and epidemiological information. The expected result is Negative. Fact Sheet for Patients:  PinkCheek.be Fact Sheet for Healthcare Providers:  GravelBags.it This test is not yet ap proved or cleared by the [place] FDA and  has been authorized for detection and/or diagnosis of SARS-CoV-2 by FDA under an Emergency Use Authorization (EUA). This EUA will remain  in effect (meaning this test can be used) for the duration of the COVID-19 declaration under Section 564(b)(1) of the Act, 21 U.S.C. section 360bbb-3(b)(1), unless the authorization is terminated or revoked sooner.    Influenza A by PCR NEGATIVE NEGATIVE Final   Influenza B by PCR NEGATIVE NEGATIVE Final    Comment: (NOTE) The Xpert Xpress SARS-CoV-2/FLU/RSV assay is intended as an aid in  the diagnosis of influenza from Nasopharyngeal swab specimens and  should not be used as a sole basis for treatment. Nasal washings and  aspirates are unacceptable for Xpert Xpress SARS-CoV-2/FLU/RSV  testing. Fact Sheet for Patients: PinkCheek.be Fact Sheet for Healthcare  Providers: GravelBags.it This test is not yet approved or cleared by the [place] FDA and  has been authorized for detection and/or diagnosis of SARS-CoV-2 by  FDA under an Emergency Use Authorization (EUA). This EUA will remain  in effect (meaning this test can be used) for the duration of the  Covid-19 declaration under Section 564(b)(1) of the Act, 21  U.S.C. section 360bbb-3(b)(1), unless the authorization is  terminated or revoked. Performed at [facility], [address] [place], [place] [address]      Labs: Basic Metabolic Panel: Recent Labs  Lab 10/31/19 0134 10/31/19 0657 11/01/19 0306 11/02/19 0324  NA 137 140 141 141  K 4.0 3.9 3.5 3.2*  CL 108 110 109 110  CO2 22 23 21* 21*  GLUCOSE 105* 102* 92 99  BUN 11 10 7* 6*  CREATININE 0.78 0.82 0.83 0.62  CALCIUM 8.4* 8.2* 8.4* 8.1*  MG  --   --   --  2.2  PHOS  --   --   --  3.1   Liver Function Tests: Recent Labs  Lab 10/31/19 0134 10/31/19 0657 11/01/19 0306 11/02/19 0324  AST 31 27 27 30   ALT 21 20 19 19   ALKPHOS 127* 116 132* 124  BILITOT 0.8 0.6 1.7* 0.9  PROT 6.7 6.0* 6.8 6.2*  ALBUMIN 3.4* 3.2* 3.5 3.2*   No results for input(s): LIPASE, AMYLASE in the last 168 hours. No results for input(s): AMMONIA in the last 168 hours. CBC: Recent Labs  Lab 10/31/19 0134 10/31/19 0657 10/31/19 1827 11/01/19 0306 11/02/19 0324  WBC 5.5 6.0  --  6.4 6.6  NEUTROABS 3.6 4.2  --   --  4.5  HGB 5.7* 5.2* 9.4* 9.1* 9.3*  HCT 20.5* 19.2* 31.8* 31.1* 31.3*  MCV 68.1* 68.6*  --  72.8* 73.1*  PLT 278 266  --  287 293   Cardiac Enzymes: No results for input(s): CKTOTAL, CKMB, CKMBINDEX, TROPONINI in the last 168 hours. BNP: BNP (last 3 results) Recent Labs    10/31/19 0940  BNP 262.5*    ProBNP (last 3 results) No results for input(s): PROBNP in the last 8760 hours.  CBG: Recent Labs  Lab 10/31/19 2130  GLUCAP 86       Signed:  Dia Crawford,  MD [facility] [phone] pager

## 2019-11-02 NOTE — Anesthesia Preprocedure Evaluation (Signed)
Anesthesia Evaluation  Patient identified by MRN, date of birth, ID band Patient awake    Reviewed: Allergy & Precautions, NPO status , Patient's Chart, lab work & pertinent test results  Airway Mallampati: II  TM Distance: >3 FB Neck ROM: Full    Dental  (+) Teeth Intact, Dental Advisory Given   Pulmonary     + decreased breath sounds      Cardiovascular negative cardio ROS   Rhythm:Regular Rate:Normal     Neuro/Psych PSYCHIATRIC DISORDERS Anxiety Depression negative neurological ROS     GI/Hepatic Neg liver ROS, hiatal hernia, PUD, GERD  Medicated,  Endo/Other  Hypothyroidism   Renal/GU negative Renal ROS     Musculoskeletal negative musculoskeletal ROS (+)   Abdominal (+) + obese,   Peds  Hematology negative hematology ROS (+)   Anesthesia Other Findings   Reproductive/Obstetrics                             Anesthesia Physical Anesthesia Plan  ASA: II  Anesthesia Plan: MAC   Post-op Pain Management:    Induction: Intravenous  PONV Risk Score and Plan: 0 and Propofol infusion  Airway Management Planned: Natural Airway and Simple Face Mask  Additional Equipment: None  Intra-op Plan:   Post-operative Plan:   Informed Consent: I have reviewed the patients History and Physical, chart, labs and discussed the procedure including the risks, benefits and alternatives for the proposed anesthesia with the patient or authorized representative who has indicated his/her understanding and acceptance.       Plan Discussed with: CRNA  Anesthesia Plan Comments:         Anesthesia Quick Evaluation

## 2019-11-02 NOTE — Interval H&P Note (Signed)
History and Physical Interval Note:  11/02/2019 11:54 AM  Renee Davidson  has presented today for surgery, with the diagnosis of iron deficiency anemia, heme + stool.  The various methods of treatment have been discussed with the patient and family. After consideration of risks, benefits and other options for treatment, the patient has consented to  Procedure(s): COLONOSCOPY WITH PROPOFOL (N/A) as a surgical intervention.  The patient's history has been reviewed, patient examined, no change in status, stable for surgery.  I have reviewed the patient's chart and labs.  Questions were answered to the patient's satisfaction.     Silvano Rusk

## 2019-11-02 NOTE — Transfer of Care (Signed)
Immediate Anesthesia Transfer of Care Note  Patient: Renee Davidson  Procedure(s) Performed: Procedure(s): COLONOSCOPY WITH PROPOFOL (N/A) BIOPSY  Patient Location: PACU  Anesthesia Type:MAC  Level of Consciousness:  sedated, patient cooperative and responds to stimulation  Airway & Oxygen Therapy:Patient Spontanous Breathing and Patient connected to face mask oxgen  Post-op Assessment:  Report given to PACU RN and Post -op Vital signs reviewed and stable  Post vital signs:  Reviewed and stable  Last Vitals:  Vitals:   11/02/19 0423 11/02/19 1108  BP: 120/65 (!) 113/51  Pulse: 72 65  Resp: 18 13  Temp: 36.9 C 36.5 C  SpO2: 84% 41%    Complications: No apparent anesthesia complications

## 2019-11-02 NOTE — Op Note (Addendum)
[facility] Patient Name: Renee Davidson Procedure Date: 11/02/2019 MRN: [id number] Attending MD: Gatha Mayer , MD Date of Birth: May 01, 1951 CSN: [id number] Age: 69 Admit Type: Inpatient Procedure:                Colonoscopy Indications:              Heme positive stool, Iron deficiency anemia Providers:                Gatha Mayer, MD, Cleda Daub, RN, Elspeth Cho Tech., Technician, Caryl Pina CRNA Referring MD:              Medicines:                Propofol per Anesthesia, Monitored Anesthesia Care Complications:            No immediate complications. Estimated Blood Loss:     Estimated blood loss was minimal. Procedure:                Pre-Anesthesia Assessment:                           - Prior to the procedure, a History and Physical                            was performed, and patient medications and                            allergies were reviewed. The patient's tolerance of                            previous anesthesia was also reviewed. The risks                            and benefits of the procedure and the sedation                            options and risks were discussed with the patient.                            All questions were answered, and informed consent                            was obtained. Prior Anticoagulants: The patient has                            taken no previous anticoagulant or antiplatelet                            agents. ASA Grade Assessment: III - A patient with                            severe systemic disease. After reviewing the risks  and benefits, the patient was deemed in                            satisfactory condition to undergo the procedure.                           After obtaining informed consent, the colonoscope                            was passed under direct vision. Throughout the                            procedure, the patient's blood  pressure, pulse, and                            oxygen saturations were monitored continuously. The                            [id number] Olympus colonoscope was                            introduced through the anus and advanced to the the                            terminal ileum, with identification of the                            appendiceal orifice and IC valve. The colonoscopy                            was somewhat difficult due to significant looping.                            Successful completion of the procedure was aided by                            applying abdominal pressure. The patient tolerated                            the procedure well. The quality of the bowel                            preparation was good. The terminal ileum, ileocecal                            valve, appendiceal orifice, and rectum were                            photographed. The bowel preparation used was                            Miralax via split dose instruction. The bowel  preparation used was enemas also. She had taken a                            Moviprep prep the day before but had formed stool                            in rectum. Scope In: 12:04:46 PM Scope Out: 12:26:10 PM Scope Withdrawal Time: 0 hours 16 minutes 3 seconds  Total Procedure Duration: 0 hours 21 minutes 24 seconds  Findings:      The digital rectal exam findings include decreased sphincter tone.      A single (solitary) ulcer was found at the ileocecal valve. No bleeding       was present. Biopsies were taken with a cold forceps for histology.       Verification of patient identification for the specimen was done.       Estimated blood loss was minimal.      The terminal ileum appeared normal.      Multiple diverticula were found in the sigmoid colon and descending       colon.      The exam was otherwise without abnormality on direct and retroflexion       views. Impression:                - Decreased sphincter tone found on digital rectal                            exam.                           - A single (solitary) ulcer at the ileocecal valve.                            Biopsied.                           - The examined portion of the ileum was normal.                           - Diverticulosis in the sigmoid colon and in the                            descending colon.                           - The examination was otherwise normal on direct                            and retroflexion views. Moderate Sedation:      Not Applicable - Patient had care per Anesthesia. Recommendation:           - Return patient to hospital ward for possible                            discharge same day. If ok later can dc - taking off  my list call if ?                           - Resume regular diet.                           - I will follow-up ulcer biopsies and arrange                            office f/u w/ Dr. Loletha Carrow - primary GI MD                           I doubt this ulcer is of clinical consequence - is                            likely incidental and possibly related to prep                           I think iron deficiency is from large hiatus hernia                            and Lysbeth Galas erosions/ulcers leaking blood                            chronically                           1 dose feraheme today, feSO4 oral at home                           I called sisters Procedure Code(s):        --- Professional ---                           [phone], Colonoscopy, flexible; with biopsy, single                            or multiple Diagnosis Code(s):        --- Professional ---                           K62.89, Other specified diseases of anus and rectum                           K63.3, Ulcer of intestine                           R19.5, Other fecal abnormalities                           D50.9, Iron deficiency anemia, unspecified                            K57.30, Diverticulosis of large intestine without  perforation or abscess without bleeding CPT copyright 2019 American Medical Association. All rights reserved. The codes documented in this report are preliminary and upon coder review may  be revised to meet current compliance requirements. Gatha Mayer, MD 11/02/2019 12:44:29 PM This report has been signed electronically. Number of Addenda: 0

## 2019-11-02 NOTE — Care Management Important Message (Signed)
Important Message  Patient Details IM Letter given to Evette Cristal SW Case Manager to present to the Patient Name: Renee Davidson MRN: [id number] Date of Birth: [birth year]   Medicare Important Message Given:  Yes     Kerin Salen 11/02/2019, 10:03 AM

## 2019-11-02 NOTE — Anesthesia Postprocedure Evaluation (Signed)
Anesthesia Post Note  Patient: Renee Davidson  Procedure(s) Performed: COLONOSCOPY WITH PROPOFOL (N/A ) BIOPSY     Patient location during evaluation: PACU Anesthesia Type: MAC Level of consciousness: awake and alert Pain management: pain level controlled Vital Signs Assessment: post-procedure vital signs reviewed and stable Respiratory status: spontaneous breathing, nonlabored ventilation, respiratory function stable and patient connected to nasal cannula oxygen Cardiovascular status: stable and blood pressure returned to baseline Postop Assessment: no apparent nausea or vomiting Anesthetic complications: no    Last Vitals:  Vitals:   11/02/19 1245 11/02/19 1305  BP: 93/65 (!) 106/58  Pulse: 73 60  Resp: 17 16  Temp:  36.5 C  SpO2: 95% 96%    Last Pain:  Vitals:   11/02/19 1305  TempSrc: Axillary  PainSc:                  Effie Berkshire

## 2019-11-02 NOTE — Progress Notes (Signed)
Pt alert and oriented.  Tolerated iron infusion with no issues. Ambulated in hall before leaving. D/C instructions given to pt and her sister [name].  Pt d/cd home.

## 2019-11-05 LAB — SURGICAL PATHOLOGY

## 2019-11-05 NOTE — Anesthesia Postprocedure Evaluation (Signed)
Anesthesia Post Note  Patient: Renee Davidson  Procedure(s) Performed: ESOPHAGOGASTRODUODENOSCOPY (EGD) WITH PROPOFOL (N/A )     Patient location during evaluation: Endoscopy Anesthesia Type: MAC Level of consciousness: awake and alert Pain management: pain level controlled Vital Signs Assessment: post-procedure vital signs reviewed and stable Respiratory status: spontaneous breathing, nonlabored ventilation, respiratory function stable and patient connected to nasal cannula oxygen Cardiovascular status: stable and blood pressure returned to baseline Postop Assessment: no apparent nausea or vomiting Anesthetic complications: no    Last Vitals:  Vitals:   11/02/19 1305 11/02/19 1548  BP: (!) 106/58 (!) 101/44  Pulse: 60 66  Resp: 16 16  Temp: 36.5 C 36.5 C  SpO2: 96% 98%    Last Pain:  Vitals:   11/02/19 1548  TempSrc: Axillary  PainSc:                  [place]

## 2019-11-06 ENCOUNTER — Encounter: Payer: Self-pay | Admitting: Internal Medicine

## 2019-11-06 DIAGNOSIS — K5790 Diverticulosis of intestine, part unspecified, without perforation or abscess without bleeding: Secondary | ICD-10-CM | POA: Diagnosis present

## 2019-11-06 DIAGNOSIS — K633 Ulcer of intestine: Secondary | ICD-10-CM | POA: Diagnosis present

## 2019-11-07 ENCOUNTER — Telehealth: Payer: Self-pay | Admitting: Nurse Practitioner

## 2019-11-07 NOTE — Telephone Encounter (Signed)
According to hospital discharge paperwork, patient was discharged on 11/06/2019 - TOC

## 2019-11-08 NOTE — Telephone Encounter (Signed)
Appointment scheduled 11/20/2019 at 8:30 am.  Offered an appointment a week earlier but that was not a good time for them.

## 2019-11-08 NOTE — Telephone Encounter (Signed)
She is not in the [facility] work QUE = she may have a regular hosp follow up

## 2019-11-20 ENCOUNTER — Encounter: Payer: Self-pay | Admitting: Nurse Practitioner

## 2019-11-20 ENCOUNTER — Ambulatory Visit (INDEPENDENT_AMBULATORY_CARE_PROVIDER_SITE_OTHER): Payer: Medicare Other | Admitting: Nurse Practitioner

## 2019-11-20 DIAGNOSIS — Z09 Encounter for follow-up examination after completed treatment for conditions other than malignant neoplasm: Secondary | ICD-10-CM

## 2019-11-20 DIAGNOSIS — B372 Candidiasis of skin and nail: Secondary | ICD-10-CM | POA: Diagnosis not present

## 2019-11-20 DIAGNOSIS — D500 Iron deficiency anemia secondary to blood loss (chronic): Secondary | ICD-10-CM | POA: Diagnosis not present

## 2019-11-20 MED ORDER — NYSTATIN 100000 UNIT/GM EX CREA: TOPICAL_CREAM | CUTANEOUS | 5 refills | Status: DC

## 2019-11-20 NOTE — Progress Notes (Signed)
Virtual Visit via telephone Note Due to COVID-19 pandemic this visit was conducted virtually. This visit type was conducted due to national recommendations for restrictions regarding the COVID-19 Pandemic (e.g. social distancing, sheltering in place) in an effort to limit this patient's exposure and mitigate transmission in our community. All issues noted in this document were discussed and addressed.  A physical exam was not performed with this format.  I connected with Renee Davidson on 11/20/19 at 9:00 by telephone and verified that I am speaking with the correct person using two identifiers. Renee Davidson is currently located at home and her sister is currently with her during visit. The provider, Dasja-Margaret Hassell Done, FNP is located in their office at time of visit.  I discussed the limitations, risks, security and privacy concerns of performing an evaluation and management service by telephone and the availability of in person appointments. I also discussed with the patient that there may be a patient responsible charge related to this service. The patient expressed understanding and agreed to proceed.   History and Present Illness:   Chief Complaint: Hospitalization Follow-up   HPI Patient sister calls in for telephone visit because patient is not able to do visit on her own due developmentally delayed. Patients sister had to take her to the ER because patient called in middle of night with confusion. AT hospital was found that she had hgb of 5.5. she was admitted to hospital and was found to have a twisted hiatal hernia that was bleeding. She got several units of blood  Her last HGB in hospital was 9.3%. her sister says sheos feeling much better and ois back to her usual self.   Review of Systems  Constitutional: Negative for diaphoresis and weight loss.  Eyes: Negative for blurred vision, double vision and pain.  Respiratory: Negative for shortness of breath.   Cardiovascular:  Negative for chest pain, palpitations, orthopnea and leg swelling.  Gastrointestinal: Negative for abdominal pain.  Skin: Negative for rash.  Neurological: Negative for dizziness, sensory change, loss of consciousness, weakness and headaches.  Endo/Heme/Allergies: Negative for polydipsia. Does not bruise/bleed easily.  Psychiatric/Behavioral: Negative for memory loss. The patient does not have insomnia.   All other systems reviewed and are negative.    Observations/Objective: Alert and oriented- answers all questions appropriately No distress    Assessment and Plan: Renee Davidson in today with chief complaint of Hospitalization Follow-up   1. Iron deficiency anemia due to chronic blood loss  2. Hospital discharge follow-up Hospital records reviewed Continue to watch diet Keep follow up appointment in 2 weeks- will recheck labs then    Follow Up Instructions: 2 weeks    I discussed the assessment and treatment plan with the patient. The patient was provided an opportunity to ask questions and all were answered. The patient agreed with the plan and demonstrated an understanding of the instructions.   The patient was advised to call back or seek an in-person evaluation if the symptoms worsen or if the condition fails to improve as anticipated.  The above assessment and management plan was discussed with the patient. The patient verbalized understanding of and has agreed to the management plan. Patient is aware to call the clinic if symptoms persist or worsen. Patient is aware when to return to the clinic for a follow-up visit. Patient educated on when it is appropriate to go to the emergency department.   Time call ended:  9:15  I provided 15 minutes of non-face-to-face time  during this encounter.    Treanna-Margaret Hassell Done, FNP

## 2019-11-30 ENCOUNTER — Other Ambulatory Visit: Payer: Self-pay | Admitting: Nurse Practitioner

## 2019-11-30 DIAGNOSIS — F411 Generalized anxiety disorder: Secondary | ICD-10-CM

## 2019-12-03 ENCOUNTER — Other Ambulatory Visit: Payer: Self-pay | Admitting: *Deleted

## 2019-12-03 MED ORDER — ASCORBIC ACID 500 MG PO TABS: 500.0000 mg | ORAL_TABLET | Freq: Every day | ORAL | 5 refills | Status: DC

## 2019-12-03 MED ORDER — FERROUS SULFATE 325 (65 FE) MG PO TABS: 325.0000 mg | ORAL_TABLET | Freq: Every day | ORAL | 2 refills | Status: DC

## 2019-12-04 ENCOUNTER — Ambulatory Visit (INDEPENDENT_AMBULATORY_CARE_PROVIDER_SITE_OTHER): Payer: Medicare Other | Admitting: Nurse Practitioner

## 2019-12-04 ENCOUNTER — Encounter: Payer: Self-pay | Admitting: Nurse Practitioner

## 2019-12-04 DIAGNOSIS — G9341 Metabolic encephalopathy: Secondary | ICD-10-CM

## 2019-12-04 DIAGNOSIS — B351 Tinea unguium: Secondary | ICD-10-CM | POA: Diagnosis not present

## 2019-12-04 DIAGNOSIS — F5101 Primary insomnia: Secondary | ICD-10-CM | POA: Diagnosis not present

## 2019-12-04 DIAGNOSIS — E785 Hyperlipidemia, unspecified: Secondary | ICD-10-CM | POA: Diagnosis not present

## 2019-12-04 DIAGNOSIS — M79676 Pain in unspecified toe(s): Secondary | ICD-10-CM | POA: Diagnosis not present

## 2019-12-04 DIAGNOSIS — E669 Obesity, unspecified: Secondary | ICD-10-CM | POA: Diagnosis not present

## 2019-12-04 DIAGNOSIS — F411 Generalized anxiety disorder: Secondary | ICD-10-CM

## 2019-12-04 DIAGNOSIS — F428 Other obsessive-compulsive disorder: Secondary | ICD-10-CM | POA: Diagnosis not present

## 2019-12-04 DIAGNOSIS — F429 Obsessive-compulsive disorder, unspecified: Secondary | ICD-10-CM

## 2019-12-04 DIAGNOSIS — E039 Hypothyroidism, unspecified: Secondary | ICD-10-CM

## 2019-12-04 DIAGNOSIS — D649 Anemia, unspecified: Secondary | ICD-10-CM

## 2019-12-04 DIAGNOSIS — K2100 Gastro-esophageal reflux disease with esophagitis, without bleeding: Secondary | ICD-10-CM

## 2019-12-04 MED ORDER — ESCITALOPRAM OXALATE 20 MG PO TABS: 20.0000 mg | ORAL_TABLET | Freq: Every day | ORAL | 1 refills | Status: DC

## 2019-12-04 MED ORDER — LOVASTATIN 40 MG PO TABS: 40.0000 mg | ORAL_TABLET | Freq: Every day | ORAL | 1 refills | Status: AC

## 2019-12-04 MED ORDER — PANTOPRAZOLE SODIUM 40 MG PO TBEC: 40.0000 mg | DELAYED_RELEASE_TABLET | Freq: Every day | ORAL | 0 refills | Status: DC

## 2019-12-04 MED ORDER — RISPERIDONE 0.25 MG PO TABS: ORAL_TABLET | ORAL | 1 refills | Status: DC

## 2019-12-04 MED ORDER — LEVOTHYROXINE SODIUM 50 MCG PO TABS: 50.0000 ug | ORAL_TABLET | Freq: Every day | ORAL | 1 refills | Status: DC

## 2019-12-04 MED ORDER — FERROUS SULFATE 325 (65 FE) MG PO TABS: 325.0000 mg | ORAL_TABLET | Freq: Every day | ORAL | 2 refills | Status: DC

## 2019-12-04 MED ORDER — LORAZEPAM 0.5 MG PO TABS: 0.5000 mg | ORAL_TABLET | Freq: Three times a day (TID) | ORAL | 2 refills | Status: DC

## 2019-12-04 NOTE — Progress Notes (Signed)
Virtual Visit via telephone Note Due to COVID-19 pandemic this visit was conducted virtually. This visit type was conducted due to national recommendations for restrictions regarding the COVID-19 Pandemic (e.g. social distancing, sheltering in place) in an effort to limit this patient's exposure and mitigate transmission in our community. All issues noted in this document were discussed and addressed.  A physical exam was not performed with this format.  I connected with Renee Davidson on 12/04/19 at 1:50 by telephone and verified that I am speaking with the correct person using two identifiers. Renee Davidson is currently located at home and her ister is currently with her during visit. The provider, Ellanore-Margaret Hassell Done, FNP is located in their office at time of visit.  I discussed the limitations, risks, security and privacy concerns of performing an evaluation and management service by telephone and the availability of in person appointments. I also discussed with the patient that there may be a patient responsible charge related to this service. The patient expressed understanding and agreed to proceed.   History and Present Illness:   Chief Complaint: Medical Management of Chronic Issues    HPI:  1. Acquired hypothyroidism No problem that aware of .  2. Acute metabolic encephalopathy She has mild retardation and live in an assistive living facility.  3. GAD (generalized anxiety disorder) Patient has always been anxious. Her sister say she has been [place] [address] her entire life. He takes ativan TID.  4. Gastroesophageal reflux disease with esophagitis without hemorrhage I on protonix and is doing well.  5. Obesity (BMI 30-39.9) No recent weight change  6. Primary insomnia Seems to sleep well on risperidal.  7. Symptomatic anemia We will need to check her hgb when family can bring her to office for labs Lab Results  Component Value Date   HGB 9.3 (L) 11/02/2019     8. Other  obsessive-compulsive disorders He has been this way her whole life but it doe snot bother her.    Outpatient Encounter Medications as of 12/04/2019  Medication Sig  . ascorbic acid (VITAMIN C) 500 MG tablet Take 1 tablet (500 mg total) by mouth daily.  . benzonatate (TESSALON PERLES) 100 MG capsule Take 1 capsule (100 mg total) by mouth 3 (three) times daily as needed for cough.  . Calcium-Vitamin D-Vitamin K 500-100-40 MG-UNT-MCG CHEW Chew 1 tablet by mouth 2 (two) times daily.   Marland Kitchen escitalopram (LEXAPRO) 20 MG tablet Take 1 tablet (20 mg total) by mouth daily.  . ferrous sulfate 325 (65 FE) MG tablet Take 1 tablet (325 mg total) by mouth daily with breakfast.  . levothyroxine (SYNTHROID) 50 MCG tablet Take 1 tablet (50 mcg total) by mouth daily.  Marland Kitchen LORazepam (ATIVAN) 0.5 MG tablet Take 1 tablet (0.5 mg total) by mouth 3 (three) times daily.  Marland Kitchen lovastatin (MEVACOR) 40 MG tablet Take 1 tablet (40 mg total) by mouth at bedtime.  Marland Kitchen nystatin cream (MYCOSTATIN) Apply to affected area 2 times daily  . ondansetron (ZOFRAN) 4 MG tablet Take 1 tablet (4 mg total) by mouth every 6 (six) hours as needed for nausea.  . pantoprazole (PROTONIX) 40 MG tablet Take 1 tablet (40 mg total) by mouth daily before breakfast.  . risperiDONE (RISPERDAL) 0.25 MG tablet TAKE 1 TABLET IN THE MORNING AND 2 TABLETS AT BEDTIME (Patient taking differently: Take 0.25-0.5 mg by mouth See admin instructions. TAKE 0.63m IN THE MORNING AND 0.518mAT BEDTIME)    Past Surgical History:  Procedure Laterality  Date  . BIOPSY  11/02/2019   Procedure: BIOPSY;  Surgeon: Gatha Mayer, MD;  Location: [facility];  Service: Endoscopy;;  . COLONOSCOPY WITH PROPOFOL N/A 11/02/2019   Procedure: COLONOSCOPY WITH PROPOFOL;  Surgeon: Gatha Mayer, MD;  Location: [facility];  Service: Endoscopy;  Laterality: N/A;  . ESOPHAGOGASTRODUODENOSCOPY (EGD) WITH PROPOFOL N/A 11/01/2019   Procedure: ESOPHAGOGASTRODUODENOSCOPY (EGD) WITH PROPOFOL;   Surgeon: Gatha Mayer, MD;  Location: [facility];  Service: Endoscopy;  Laterality: N/A;  . TONSILLECTOMY      Family History  Problem Relation Age of Onset  . Diabetes Father   . Hyperlipidemia Father   . Hypertension Father   . Atrial fibrillation Father   . Hyperlipidemia Sister   . Dysphagia Sister   . GI problems Mother   . Heart disease Maternal Grandfather   . Colon cancer Maternal Grandfather     New complaints: Speaking with patient iter, patient wa in [facility]ital with a GI bleed. He had to be given several unit of blood while there. He ha been doing well ince he went home.  C/O feeling a little tired but other then taht they have heard no complaint.  Social history: Lives in assistive living  Controlled substance contract: n/a    Review of Systems  Constitutional: Negative for diaphoresis and weight loss.  Eyes: Negative for blurred vision, double vision and pain.  Respiratory: Negative for shortness of breath.   Cardiovascular: Negative for chest pain, palpitations, orthopnea and leg swelling.  Gastrointestinal: Negative for abdominal pain.  Skin: Negative for rash.  Neurological: Negative for dizziness, sensory change, loss of consciousness, weakness and headaches.  Endo/Heme/Allergies: Negative for polydipsia. Does not bruise/bleed easily.  Psychiatric/Behavioral: Negative for memory loss. The patient does not have insomnia.   All other systems reviewed and are negative.    Observations/Objective: Alert and oriented- answers all questions appropriately No distress    Assessment and Plan: Renee Davidson comes in today with chief complaint of Medical Management of Chronic Issues   Diagnosis and orders addressed: 1. Acquired hypothyroidism - Thyroid Panel With TSH  2. Acute metabolic encephalopathy  3. GAD (generalized anxiety disorder) stressmanagement - LORazepam (ATIVAN) 0.5 MG tablet; Take 1 tablet (0.5 mg total) by mouth 3 (three) times daily.   Dispense: 90 tablet; Refill: 2  4. Gastroesophageal reflux disease with esophagitis without hemorrhage Avoid spicy foods Do not eat 2 hours prior to bedtime - pantoprazole (PROTONIX) 40 MG tablet; Take 1 tablet (40 mg total) by mouth daily before breakfast.  Dispense: 30 tablet; Refill: 0  5. Obesity (BMI 30-39.9) Discussed diet and exercise for person with BMI >25 Will recheck weight in 3-6 months  6. Primary insomnia Bedtime routine - risperiDONE (RISPERDAL) 0.25 MG tablet; TAKE 1 TABLET IN THE MORNING AND 2 TABLETS AT BEDTIME  Dispense: 270 tablet; Refill: 1  7. Symptomatic anemia Lab pending - CBC with Differential/Platelet - CMP14+EGFR - Lipid panel - ferrous sulfate 325 (65 FE) MG tablet; Take 1 tablet (325 mg total) by mouth daily with breakfast.  Dispense: 30 tablet; Refill: 2  8. Other obsessive-compulsive disorders  9. Hypothyroidism, unspecified type - levothyroxine (SYNTHROID) 50 MCG tablet; Take 1 tablet (50 mcg total) by mouth daily.  Dispense: 90 tablet; Refill: 1  10. Hyperlipidemia with target LDL less than 100 - lovastatin (MEVACOR) 40 MG tablet; Take 1 tablet (40 mg total) by mouth at bedtime.  Dispense: 90 tablet; Refill: 1  11. Obsessive-compulsive disorder, unspecified type - escitalopram (LEXAPRO)  20 MG tablet; Take 1 tablet (20 mg total) by mouth daily.  Dispense: 90 tablet; Refill: 1   Labs pending Health Maintenance reviewed Diet and exercise encouraged  Follow up plan: 3 month    discussed the assessment and treatment plan with the patient. The patient was provided an opportunity to ask questions and all were answered. The patient agreed with the plan and demonstrated an understanding of the instructions.   The patient was advised to call back or seek an in-person evaluation if the symptoms worsen or if the condition fails to improve as anticipated.  The above assessment and management plan was discussed with the patient. The patient verbalized  understanding of and has agreed to the management plan. Patient is aware to call the clinic if symptoms persist or worsen. Patient is aware when to return to the clinic for a follow-up visit. Patient educated on when it is appropriate to go to the emergency department.   Time call ended:  2:12 I provided 22 minutes of non-face-to-face time during this encounter.    Kierstin-Margaret Hassell Done, FNP

## 2019-12-05 LAB — CMP14+EGFR
ALT: 13 IU/L (ref 0–32)
AST: 20 IU/L (ref 0–40)
Albumin/Globulin Ratio: 1.7 (ref 1.2–2.2)
Albumin: 3.9 g/dL (ref 3.7–4.7)
Alkaline Phosphatase: 141 IU/L — ABNORMAL HIGH (ref 39–117)
BUN/Creatinine Ratio: 15 (ref 12–28)
BUN: 11 mg/dL (ref 8–27)
Bilirubin Total: 0.2 mg/dL (ref 0.0–1.2)
CO2: 21 mmol/L (ref 20–29)
Calcium: 9.3 mg/dL (ref 8.7–10.3)
Chloride: 106 mmol/L (ref 96–106)
Creatinine, Ser: 0.75 mg/dL (ref 0.57–1.00)
GFR calc Af Amer: 93 mL/min/{1.73_m2} (ref >59)
GFR calc non Af Amer: 80 mL/min/{1.73_m2} (ref >59)
Globulin, Total: 2.3 g/dL (ref 1.5–4.5)
Glucose: 83 mg/dL (ref 65–99)
Potassium: 4.4 mmol/L (ref 3.5–5.2)
Sodium: 140 mmol/L (ref 134–144)
Total Protein: 6.2 g/dL (ref 6.0–8.5)

## 2019-12-05 LAB — THYROID PANEL WITH TSH
Free Thyroxine Index: 2.2 (ref 1.2–4.9)
T3 Uptake Ratio: 28 % (ref 24–39)
T4, Total: 7.7 ug/dL (ref 4.5–12.0)
TSH: 0.761 u[IU]/mL (ref 0.450–4.500)

## 2019-12-05 LAB — CBC WITH DIFFERENTIAL/PLATELET
Basophils Absolute: 0 10*3/uL (ref 0.0–0.2)
Basos: 1 %
EOS (ABSOLUTE): 0.1 10*3/uL (ref 0.0–0.4)
Eos: 1 %
Hematocrit: 42.2 % (ref 34.0–46.6)
Hemoglobin: 12.6 g/dL (ref 11.1–15.9)
Immature Grans (Abs): 0 10*3/uL (ref 0.0–0.1)
Immature Granulocytes: 0 %
Lymphocytes Absolute: 0.7 10*3/uL (ref 0.7–3.1)
Lymphs: 13 %
MCH: 25.3 pg — ABNORMAL LOW (ref 26.6–33.0)
MCHC: 29.9 g/dL — ABNORMAL LOW (ref 31.5–35.7)
MCV: 85 fL (ref 79–97)
Monocytes Absolute: 0.4 10*3/uL (ref 0.1–0.9)
Monocytes: 7 %
Neutrophils Absolute: 4.4 10*3/uL (ref 1.4–7.0)
Neutrophils: 78 %
Platelets: 206 10*3/uL (ref 150–450)
RBC: 4.98 x10E6/uL (ref 3.77–5.28)
WBC: 5.6 10*3/uL (ref 3.4–10.8)

## 2019-12-05 LAB — LIPID PANEL
Chol/HDL Ratio: 2.7 ratio (ref 0.0–4.4)
Cholesterol, Total: 143 mg/dL (ref 100–199)
HDL: 53 mg/dL (ref >39)
LDL Chol Calc (NIH): 76 mg/dL (ref 0–99)
Triglycerides: 70 mg/dL (ref 0–149)
VLDL Cholesterol Cal: 14 mg/dL (ref 5–40)

## 2020-02-08 ENCOUNTER — Telehealth: Payer: Self-pay | Admitting: Nurse Practitioner

## 2020-02-08 ENCOUNTER — Other Ambulatory Visit: Payer: Self-pay | Admitting: Nurse Practitioner

## 2020-02-08 DIAGNOSIS — K922 Gastrointestinal hemorrhage, unspecified: Secondary | ICD-10-CM

## 2020-02-08 NOTE — Telephone Encounter (Signed)
Patient aware.

## 2020-02-08 NOTE — Telephone Encounter (Signed)
Sure she can come in anytime nad have hgb drawn

## 2020-02-08 NOTE — Telephone Encounter (Signed)
Pts sister called back wanting to know if [facility]MM was able to put in order for pt to have lab work done to check hemoglobin. Says it takes her about 2 hrs to go get pt and bring her here but if MMM can put order in soon, she will try and have pt here before 4:30 today.

## 2020-02-08 NOTE — Telephone Encounter (Signed)
Can we order a hemoglobin check?

## 2020-02-11 ENCOUNTER — Other Ambulatory Visit: Payer: Medicare Other

## 2020-02-11 ENCOUNTER — Other Ambulatory Visit: Payer: Self-pay

## 2020-02-11 DIAGNOSIS — K922 Gastrointestinal hemorrhage, unspecified: Secondary | ICD-10-CM

## 2020-02-11 LAB — HEMOGLOBIN, FINGERSTICK: Hemoglobin: 14.1 g/dL (ref 11.1–15.9)

## 2020-02-22 ENCOUNTER — Encounter: Payer: Self-pay | Admitting: Nurse Practitioner

## 2020-02-22 ENCOUNTER — Ambulatory Visit (INDEPENDENT_AMBULATORY_CARE_PROVIDER_SITE_OTHER): Payer: Medicare Other | Admitting: Nurse Practitioner

## 2020-02-22 ENCOUNTER — Other Ambulatory Visit: Payer: Self-pay

## 2020-02-22 VITALS — BP 117/80 | HR 69 | Temp 98.7°F | Ht 65.0 in | Wt 209.8 lb

## 2020-02-22 DIAGNOSIS — E669 Obesity, unspecified: Secondary | ICD-10-CM

## 2020-02-22 DIAGNOSIS — K2100 Gastro-esophageal reflux disease with esophagitis, without bleeding: Secondary | ICD-10-CM | POA: Diagnosis not present

## 2020-02-22 DIAGNOSIS — K922 Gastrointestinal hemorrhage, unspecified: Secondary | ICD-10-CM | POA: Diagnosis not present

## 2020-02-22 DIAGNOSIS — M810 Age-related osteoporosis without current pathological fracture: Secondary | ICD-10-CM

## 2020-02-22 DIAGNOSIS — D649 Anemia, unspecified: Secondary | ICD-10-CM

## 2020-02-22 DIAGNOSIS — F429 Obsessive-compulsive disorder, unspecified: Secondary | ICD-10-CM

## 2020-02-22 DIAGNOSIS — F411 Generalized anxiety disorder: Secondary | ICD-10-CM | POA: Diagnosis not present

## 2020-02-22 DIAGNOSIS — E785 Hyperlipidemia, unspecified: Secondary | ICD-10-CM

## 2020-02-22 DIAGNOSIS — K633 Ulcer of intestine: Secondary | ICD-10-CM | POA: Diagnosis not present

## 2020-02-22 DIAGNOSIS — K5790 Diverticulosis of intestine, part unspecified, without perforation or abscess without bleeding: Secondary | ICD-10-CM

## 2020-02-22 DIAGNOSIS — F428 Other obsessive-compulsive disorder: Secondary | ICD-10-CM

## 2020-02-22 DIAGNOSIS — E039 Hypothyroidism, unspecified: Secondary | ICD-10-CM | POA: Diagnosis not present

## 2020-02-22 DIAGNOSIS — F5101 Primary insomnia: Secondary | ICD-10-CM | POA: Diagnosis not present

## 2020-02-22 DIAGNOSIS — K449 Diaphragmatic hernia without obstruction or gangrene: Secondary | ICD-10-CM | POA: Diagnosis not present

## 2020-02-22 MED ORDER — RISPERIDONE 0.25 MG PO TABS: ORAL_TABLET | ORAL | 1 refills | Status: DC

## 2020-02-22 MED ORDER — LEVOTHYROXINE SODIUM 50 MCG PO TABS: 50.0000 ug | ORAL_TABLET | Freq: Every day | ORAL | 1 refills | Status: DC

## 2020-02-22 MED ORDER — FERROUS SULFATE 325 (65 FE) MG PO TABS: 325.0000 mg | ORAL_TABLET | Freq: Every day | ORAL | 1 refills | Status: AC

## 2020-02-22 MED ORDER — LORAZEPAM 0.5 MG PO TABS: 0.5000 mg | ORAL_TABLET | Freq: Three times a day (TID) | ORAL | 5 refills | Status: DC

## 2020-02-22 MED ORDER — ESCITALOPRAM OXALATE 20 MG PO TABS: 20.0000 mg | ORAL_TABLET | Freq: Every day | ORAL | 1 refills | Status: AC

## 2020-02-22 MED ORDER — PANTOPRAZOLE SODIUM 40 MG PO TBEC: 40.0000 mg | DELAYED_RELEASE_TABLET | Freq: Every day | ORAL | 1 refills | Status: DC

## 2020-02-22 NOTE — Progress Notes (Signed)
Subjective:    Patient ID: Renee Davidson, female    DOB: [birth year], 69 y.o.   MRN: [id number]   Chief Complaint: Medical Management of Chronic Issues    HPI:  1. Hyperlipidemia with target LDL less than 100 Patient does partake in an exercise class regularly, does not limit fat intake.  Lab Results  Component Value Date   CHOL 143 12/04/2019   HDL 53 12/04/2019   LDLCALC 76 12/04/2019   TRIG 70 12/04/2019   CHOLHDL 2.7 12/04/2019     2. GAD (generalized anxiety disorder) Patient does not report any anxiety. Does take medication as prescribed.    3. Other obsessive-compulsive disorders Calls her caregivers multiple times per day, does suffer with compulsions often.   4. Primary insomnia Sleeps without problems, falls asleep and sometimes does not stay asleep.   5. Symptomatic anemia No reports of light-headedness or dizziness.  Lab Results  Component Value Date   HGB 12.6 12/04/2019     6. Hiatal hernia No symptoms at this time.   7. Gastroesophageal reflux disease with esophagitis without hemorrhage Nexium helps with this, no episodes recently.   8. Acute GI bleeding No blood or coffee-ground emesis.   9. Ileum ulcer No blood in her stool.  10. Diverticulosis Does not avoid seeded foods or nuts. No pain or episodes.   11. Acquired hypothyroidism Does take thyroid medication regularly, does not report fatigue.  Lab Results  Component Value Date   TSH 0.761 12/04/2019   T4TOTAL 7.7 12/04/2019     12. Osteoporosis, post-menopausal Does take calcium supplements daily.   13. Obesity (BMI 30-39.9) Does not limit fat intake, does exercise regularly.  BMI Readings from Last 3 Encounters:  11/02/19 33.13 kg/m  07/07/18 31.80 kg/m  05/09/18 31.31 kg/m   Wt Readings from Last 3 Encounters:  11/02/19 199 lb 1.2 oz (90.3 kg)  07/07/18 197 lb (89.4 kg)  05/09/18 194 lb (88 kg)     Outpatient Encounter Medications as of 02/22/2020  Medication Sig    . ascorbic acid (VITAMIN C) 500 MG tablet Take 1 tablet (500 mg total) by mouth daily.  . benzonatate (TESSALON PERLES) 100 MG capsule Take 1 capsule (100 mg total) by mouth 3 (three) times daily as needed for cough.  . Calcium-Vitamin D-Vitamin K 500-100-40 MG-UNT-MCG CHEW Chew 1 tablet by mouth 2 (two) times daily.   Marland Kitchen escitalopram (LEXAPRO) 20 MG tablet Take 1 tablet (20 mg total) by mouth daily.  . ferrous sulfate 325 (65 FE) MG tablet Take 1 tablet (325 mg total) by mouth daily with breakfast.  . levothyroxine (SYNTHROID) 50 MCG tablet Take 1 tablet (50 mcg total) by mouth daily.  Marland Kitchen LORazepam (ATIVAN) 0.5 MG tablet Take 1 tablet (0.5 mg total) by mouth 3 (three) times daily.  Marland Kitchen lovastatin (MEVACOR) 40 MG tablet Take 1 tablet (40 mg total) by mouth at bedtime.  Marland Kitchen nystatin cream (MYCOSTATIN) Apply to affected area 2 times daily  . ondansetron (ZOFRAN) 4 MG tablet Take 1 tablet (4 mg total) by mouth every 6 (six) hours as needed for nausea.  . pantoprazole (PROTONIX) 40 MG tablet Take 1 tablet (40 mg total) by mouth daily before breakfast.  . risperiDONE (RISPERDAL) 0.25 MG tablet TAKE 1 TABLET IN THE MORNING AND 2 TABLETS AT BEDTIME   No facility-administered encounter medications on file as of 02/22/2020.    Past Surgical History:  Procedure Laterality Date  . BIOPSY  11/02/2019   Procedure: BIOPSY;  Surgeon: Gatha Mayer, MD;  Location: [facility];  Service: Endoscopy;;  . COLONOSCOPY WITH PROPOFOL N/A 11/02/2019   Procedure: COLONOSCOPY WITH PROPOFOL;  Surgeon: Gatha Mayer, MD;  Location: [facility];  Service: Endoscopy;  Laterality: N/A;  . ESOPHAGOGASTRODUODENOSCOPY (EGD) WITH PROPOFOL N/A 11/01/2019   Procedure: ESOPHAGOGASTRODUODENOSCOPY (EGD) WITH PROPOFOL;  Surgeon: Gatha Mayer, MD;  Location: [facility];  Service: Endoscopy;  Laterality: N/A;  . TONSILLECTOMY      Family History  Problem Relation Age of Onset  . Diabetes Father   . Hyperlipidemia Father   .  Hypertension Father   . Atrial fibrillation Father   . Hyperlipidemia Sister   . Dysphagia Sister   . GI problems Mother   . Heart disease Maternal Grandfather   . Colon cancer Maternal Grandfather     New complaints: No complaints at this time.   Social history: Has a legal guardian.   Controlled substance contract: n/a     Review of Systems  Constitutional: Negative.   HENT: Negative.   Eyes: Negative.   Respiratory: Negative.   Cardiovascular: Negative.   Gastrointestinal: Negative.   Endocrine: Negative.   Genitourinary: Negative.   Musculoskeletal: Negative.   Skin: Negative.   Allergic/Immunologic: Negative.   Neurological: Negative.   Hematological: Negative.   Psychiatric/Behavioral: Negative.        Objective:   Physical Exam Vitals reviewed.  Constitutional:      Appearance: Normal appearance. Renee Davidson is obese.  HENT:     Head: Normocephalic and atraumatic.     Right Ear: Tympanic membrane, ear canal and external ear normal.     Left Ear: Tympanic membrane, ear canal and external ear normal.     Nose: Nose normal.     Mouth/Throat:     Mouth: Mucous membranes are moist.     Pharynx: Oropharynx is clear.  Eyes:     Extraocular Movements: Extraocular movements intact.     Conjunctiva/sclera: Conjunctivae normal.     Pupils: Pupils are equal, round, and reactive to light.  Cardiovascular:     Rate and Rhythm: Normal rate and regular rhythm.     Pulses: Normal pulses.     Heart sounds: Normal heart sounds.  Pulmonary:     Effort: Pulmonary effort is normal.     Breath sounds: Normal breath sounds.  Abdominal:     General: Bowel sounds are normal.     Palpations: Abdomen is soft.  Genitourinary:    General: Normal vulva.     Rectum: Normal.  Musculoskeletal:        General: Normal range of motion.     Cervical back: Normal range of motion and neck supple.  Skin:    General: Skin is warm and dry.     Capillary Refill: Capillary refill takes  less than 2 seconds.  Neurological:     General: No focal deficit present.     Mental Status: Renee Davidson is alert and oriented to person, place, and time. Mental status is at baseline.  Psychiatric:        Mood and Affect: Mood normal.        Behavior: Behavior normal.        Thought Content: Thought content normal.        Judgment: Judgment normal.     BP 117/80   Pulse 69   Temp 98.7 F (37.1 C) (Temporal)   Ht '5\' 5"'  (1.651 m)   Wt (!) 209 lb 12.8 oz (95.2 kg)  BMI 34.91 kg/m       Assessment & Plan:  Renee Davidson comes in today with chief complaint of Medical Management of Chronic Issues   Diagnosis and orders addressed:  1. Hyperlipidemia with target LDL less than 100 Patient and caregiver encouraged to be mindful of fat intake and exercise as tolerated.   2. GAD (generalized anxiety disorder) Patient encouraged to verbalize feelings of anxiety and self harm.   3. Other obsessive-compulsive disorders Patient encouraged to report feelings of discomfort and compulsions that are overwhelming.   4. Primary insomnia Patient encouraged to develop a bedtime routine and follow it daily. Avoid caffeine close to bedtime.   5. Symptomatic anemia Patient encouraged to maintain regular follow-ups for bloodwork and to report feelings of dizziness/lightheadedness.   6. Hiatal hernia Patient instructed to avoid heavy lifting.   7. Gastroesophageal reflux disease with esophagitis without hemorrhage Patient encouraged to avoid spicy/acidic foods and to postpone laying down after meals.   8. Acute GI bleeding Patient encouraged to report vomiting with bright-red blood and coffee grounds.   9. Ileum ulcer Patient encouraged to report epigastric pain and melena.   10. Diverticulosis Patient encouraged to avoid foods with seeds and nuts.   11. Acquired hypothyroidism Patient encouraged to maintain medication regimen at the same time each day.   12. Osteoporosis,  post-menopausal Patient encouraged to take calcium supplements.   13. Obesity (BMI 30-39.9) Patient encouraged to exercise as tolerated and to weigh self regularly. Restrict dietary fat intake.   Meds ordered this encounter  Medications  . LORazepam (ATIVAN) 0.5 MG tablet    Sig: Take 1 tablet (0.5 mg total) by mouth 3 (three) times daily.    Dispense:  90 tablet    Refill:  5    Order Specific Question:   Supervising Provider    Answer:   Caryl Pina A [id number]  . pantoprazole (PROTONIX) 40 MG tablet    Sig: Take 1 tablet (40 mg total) by mouth daily before breakfast.    Dispense:  90 tablet    Refill:  1    Order Specific Question:   Supervising Provider    Answer:   Caryl Pina A [id number]  . levothyroxine (SYNTHROID) 50 MCG tablet    Sig: Take 1 tablet (50 mcg total) by mouth daily.    Dispense:  90 tablet    Refill:  1    Note new dose    Order Specific Question:   Supervising Provider    Answer:   Caryl Pina A [id number]  . escitalopram (LEXAPRO) 20 MG tablet    Sig: Take 1 tablet (20 mg total) by mouth daily.    Dispense:  90 tablet    Refill:  1    Order Specific Question:   Supervising Provider    Answer:   Caryl Pina A [id number]  . risperiDONE (RISPERDAL) 0.25 MG tablet    Sig: TAKE 1 TABLET IN THE MORNING AND 2 TABLETS AT BEDTIME    Dispense:  270 tablet    Refill:  1    Order Specific Question:   Supervising Provider    Answer:   Caryl Pina A [id number]  . ferrous sulfate 325 (65 FE) MG tablet    Sig: Take 1 tablet (325 mg total) by mouth daily with breakfast.    Dispense:  90 tablet    Refill:  1    Order Specific Question:   Supervising Provider    Answer:   Caryl Pina  A [[id number]]   Orders Placed This Encounter  Procedures  . CBC with Differential/Platelet  . CMP14+EGFR  . Lipid panel  . Thyroid Panel With TSH     Labs pending Health Maintenance reviewed Diet and exercise encouraged  Follow up  plan: Follow-up in 6 months    Alisen-Margaret Hassell Done, [place], [place] Student

## 2020-02-23 LAB — CBC WITH DIFFERENTIAL/PLATELET
Basophils Absolute: 0 10*3/uL (ref 0.0–0.2)
Basos: 1 %
EOS (ABSOLUTE): 0.1 10*3/uL (ref 0.0–0.4)
Eos: 1 %
Hematocrit: 44.6 % (ref 34.0–46.6)
Hemoglobin: 14.2 g/dL (ref 11.1–15.9)
Immature Grans (Abs): 0 10*3/uL (ref 0.0–0.1)
Immature Granulocytes: 0 %
Lymphocytes Absolute: 1.3 10*3/uL (ref 0.7–3.1)
Lymphs: 19 %
MCH: 28.6 pg (ref 26.6–33.0)
MCHC: 31.8 g/dL (ref 31.5–35.7)
MCV: 90 fL (ref 79–97)
Monocytes Absolute: 0.6 10*3/uL (ref 0.1–0.9)
Monocytes: 8 %
Neutrophils Absolute: 4.8 10*3/uL (ref 1.4–7.0)
Neutrophils: 71 %
Platelets: 233 10*3/uL (ref 150–450)
RBC: 4.97 x10E6/uL (ref 3.77–5.28)
RDW: 12.6 % (ref 11.7–15.4)
WBC: 6.8 10*3/uL (ref 3.4–10.8)

## 2020-02-23 LAB — LIPID PANEL
Chol/HDL Ratio: 3.1 ratio (ref 0.0–4.4)
Cholesterol, Total: 163 mg/dL (ref 100–199)
HDL: 53 mg/dL (ref >39)
LDL Chol Calc (NIH): 96 mg/dL (ref 0–99)
Triglycerides: 74 mg/dL (ref 0–149)
VLDL Cholesterol Cal: 14 mg/dL (ref 5–40)

## 2020-02-23 LAB — CMP14+EGFR
ALT: 13 IU/L (ref 0–32)
AST: 18 IU/L (ref 0–40)
Albumin/Globulin Ratio: 1.5 (ref 1.2–2.2)
Albumin: 4 g/dL (ref 3.7–4.7)
Alkaline Phosphatase: 138 IU/L — ABNORMAL HIGH (ref 48–121)
BUN/Creatinine Ratio: 14 (ref 12–28)
BUN: 11 mg/dL (ref 8–27)
Bilirubin Total: 0.3 mg/dL (ref 0.0–1.2)
CO2: 22 mmol/L (ref 20–29)
Calcium: 9.1 mg/dL (ref 8.7–10.3)
Chloride: 103 mmol/L (ref 96–106)
Creatinine, Ser: 0.8 mg/dL (ref 0.57–1.00)
GFR calc Af Amer: 86 mL/min/{1.73_m2} (ref >59)
GFR calc non Af Amer: 74 mL/min/{1.73_m2} (ref >59)
Globulin, Total: 2.6 g/dL (ref 1.5–4.5)
Glucose: 80 mg/dL (ref 65–99)
Potassium: 4.6 mmol/L (ref 3.5–5.2)
Sodium: 139 mmol/L (ref 134–144)
Total Protein: 6.6 g/dL (ref 6.0–8.5)

## 2020-02-23 LAB — THYROID PANEL WITH TSH
Free Thyroxine Index: 2.4 (ref 1.2–4.9)
T3 Uptake Ratio: 29 % (ref 24–39)
T4, Total: 8.2 ug/dL (ref 4.5–12.0)
TSH: 0.893 u[IU]/mL (ref 0.450–4.500)

## 2020-03-03 ENCOUNTER — Ambulatory Visit: Payer: Self-pay | Admitting: Nurse Practitioner

## 2020-03-25 DIAGNOSIS — B351 Tinea unguium: Secondary | ICD-10-CM | POA: Diagnosis not present

## 2020-03-25 DIAGNOSIS — M79676 Pain in unspecified toe(s): Secondary | ICD-10-CM | POA: Diagnosis not present

## 2020-04-19 ENCOUNTER — Other Ambulatory Visit: Payer: Self-pay

## 2020-04-19 ENCOUNTER — Emergency Department (HOSPITAL_COMMUNITY): Payer: Medicare Other

## 2020-04-19 ENCOUNTER — Emergency Department (HOSPITAL_COMMUNITY)
Admission: EM | Admit: 2020-04-19 | Discharge: 2020-04-19 | Disposition: A | Discharge status: Home / Self Care | Payer: Medicare Other | Attending: Emergency Medicine | Admitting: Emergency Medicine

## 2020-04-19 ENCOUNTER — Encounter (HOSPITAL_COMMUNITY): Payer: Self-pay | Admitting: Emergency Medicine

## 2020-04-19 DIAGNOSIS — E039 Hypothyroidism, unspecified: Secondary | ICD-10-CM | POA: Diagnosis not present

## 2020-04-19 DIAGNOSIS — Z20822 Contact with and (suspected) exposure to covid-19: Secondary | ICD-10-CM | POA: Diagnosis not present

## 2020-04-19 DIAGNOSIS — R531 Weakness: Secondary | ICD-10-CM

## 2020-04-19 DIAGNOSIS — R05 Cough: Secondary | ICD-10-CM | POA: Diagnosis not present

## 2020-04-19 DIAGNOSIS — R9431 Abnormal electrocardiogram [ECG] [EKG]: Secondary | ICD-10-CM | POA: Diagnosis not present

## 2020-04-19 LAB — BASIC METABOLIC PANEL
Anion gap: 10 (ref 5–15)
BUN: 6 mg/dL — ABNORMAL LOW (ref 8–23)
CO2: 26 mmol/L (ref 22–32)
Calcium: 9.4 mg/dL (ref 8.9–10.3)
Chloride: 104 mmol/L (ref 98–111)
Creatinine, Ser: 0.77 mg/dL (ref 0.44–1.00)
GFR calc Af Amer: >60 mL/min (ref >60)
GFR calc non Af Amer: >60 mL/min (ref >60)
Glucose, Bld: 93 mg/dL (ref 70–99)
Potassium: 3.7 mmol/L (ref 3.5–5.1)
Sodium: 140 mmol/L (ref 135–145)

## 2020-04-19 LAB — HEPATIC FUNCTION PANEL
ALT: 18 U/L (ref 0–44)
AST: 23 U/L (ref 15–41)
Albumin: 3.8 g/dL (ref 3.5–5.0)
Alkaline Phosphatase: 110 U/L (ref 38–126)
Bilirubin, Direct: <0.1 mg/dL (ref 0.0–0.2)
Total Bilirubin: 0.9 mg/dL (ref 0.3–1.2)
Total Protein: 7.3 g/dL (ref 6.5–8.1)

## 2020-04-19 LAB — CBC
HCT: 43 % (ref 36.0–46.0)
Hemoglobin: 13.8 g/dL (ref 12.0–15.0)
MCH: 29.1 pg (ref 26.0–34.0)
MCHC: 32.1 g/dL (ref 30.0–36.0)
MCV: 90.7 fL (ref 80.0–100.0)
Platelets: 198 10*3/uL (ref 150–400)
RBC: 4.74 MIL/uL (ref 3.87–5.11)
RDW: 13.1 % (ref 11.5–15.5)
WBC: 5.3 10*3/uL (ref 4.0–10.5)
nRBC: 0 % (ref 0.0–0.2)

## 2020-04-19 LAB — URINALYSIS, ROUTINE W REFLEX MICROSCOPIC
Bilirubin Urine: NEGATIVE
Glucose, UA: NEGATIVE mg/dL
Hgb urine dipstick: NEGATIVE
Ketones, ur: NEGATIVE mg/dL
Leukocytes,Ua: NEGATIVE
Nitrite: NEGATIVE
Protein, ur: NEGATIVE mg/dL
Specific Gravity, Urine: 1.004 — ABNORMAL LOW (ref 1.005–1.030)
pH: 6 (ref 5.0–8.0)

## 2020-04-19 LAB — LIPASE, BLOOD: Lipase: 32 U/L (ref 11–51)

## 2020-04-19 LAB — CBG MONITORING, ED
Glucose-Capillary: 84 mg/dL (ref 70–99)
Glucose-Capillary: 86 mg/dL (ref 70–99)

## 2020-04-19 LAB — SARS CORONAVIRUS 2 BY RT PCR (HOSPITAL ORDER, PERFORMED IN ~~LOC~~ HOSPITAL LAB): SARS Coronavirus 2: NEGATIVE

## 2020-04-19 MED ORDER — SODIUM CHLORIDE 0.9 % IV BOLUS
1000.0000 mL | Freq: Once | INTRAVENOUS | Status: AC
  Administered 2020-04-19: 1000 mL via INTRAVENOUS

## 2020-04-19 MED ORDER — SODIUM CHLORIDE 0.9% FLUSH: 3.0000 mL | Freq: Once | INTRAVENOUS | Status: DC

## 2020-04-19 NOTE — Discharge Instructions (Addendum)
Follow up with primary care doctor.

## 2020-04-19 NOTE — ED Notes (Signed)
Pt discharged with all belongings. Discharge instructions reviewed with pt, verbalizes understanding.

## 2020-04-19 NOTE — ED Triage Notes (Signed)
Pt to triage with family member.  Reports dizziness x 1 week with falls.  No arm drift.  Pt reports upper abd pain while sitting in triage room that started since arrival and has already resolved.  Family member thinks it may be related to how she is sitting.  Sent from PCP office where BP was 80/60.

## 2020-04-19 NOTE — ED Provider Notes (Signed)
[facility] EMERGENCY DEPARTMENT Provider Note   CSN: [id number] Arrival date & time: 04/19/20  1126     History Chief Complaint  Patient presents with  . Weakness    Renee Davidson is a 69 y.o. female.  The history is provided by the patient and a caregiver.  Dizziness Quality:  Lightheadedness Severity:  Mild Onset quality:  Gradual Timing:  Intermittent Progression:  Waxing and waning Chronicity:  New Context: bending over and standing up   Relieved by:  Being still Associated symptoms: weakness (generalized)   Associated symptoms: no blood in stool, no chest pain, no diarrhea, no headaches, no hearing loss, no nausea, no palpitations, no shortness of breath, no syncope, no tinnitus, no vision changes and no vomiting   Risk factors: no hx of vertigo and no multiple medications        Past Medical History:  Diagnosis Date  . Anxiety   . Depression   . GERD (gastroesophageal reflux disease)   . Hyperlipidemia   . Mental developmental delay   . OCD (obsessive compulsive disorder)   . Osteoporosis   . Thyroid disease     Patient Active Problem List   Diagnosis Date Noted  . Ileum ulcer 11/06/2019  . Diverticulosis 11/06/2019  . Acute upper GI bleed 11/01/2019  . Hiatal hernia 11/01/2019  . Cameron lesion, acute 11/01/2019  . Heme + stool   . Acute GI bleeding 10/31/2019  . Obesity (BMI 30-39.9) 10/31/2019  . Acute metabolic encephalopathy 16/08/930  . Iron deficiency anemia   . Family history of colon cancer   . Family history of colonic polyps   . Symptomatic anemia   . Mental developmental delay   . Advance directive discussed with patient   . Primary insomnia 06/06/2017  . Hypothyroidism 01/17/2014  . Hyperlipidemia with target LDL less than 100 01/17/2014  . GAD (generalized anxiety disorder) 01/17/2014  . GERD (gastroesophageal reflux disease) 01/17/2014  . OCD (obsessive compulsive disorder) 01/17/2014  . Osteoporosis,  post-menopausal 12/05/2013    Past Surgical History:  Procedure Laterality Date  . BIOPSY  11/02/2019   Procedure: BIOPSY;  Surgeon: Gatha Mayer, MD;  Location: [facility];  Service: Endoscopy;;  . COLONOSCOPY WITH PROPOFOL N/A 11/02/2019   Procedure: COLONOSCOPY WITH PROPOFOL;  Surgeon: Gatha Mayer, MD;  Location: [facility];  Service: Endoscopy;  Laterality: N/A;  . ESOPHAGOGASTRODUODENOSCOPY (EGD) WITH PROPOFOL N/A 11/01/2019   Procedure: ESOPHAGOGASTRODUODENOSCOPY (EGD) WITH PROPOFOL;  Surgeon: Gatha Mayer, MD;  Location: [facility];  Service: Endoscopy;  Laterality: N/A;  . TONSILLECTOMY       OB History   No obstetric history on file.     Family History  Problem Relation Age of Onset  . Diabetes Father   . Hyperlipidemia Father   . Hypertension Father   . Atrial fibrillation Father   . Hyperlipidemia Sister   . Dysphagia Sister   . GI problems Mother   . Heart disease Maternal Grandfather   . Colon cancer Maternal Grandfather     Social History   Tobacco Use  . Smoking status: Never Smoker  . Smokeless tobacco: Never Used  Vaping Use  . Vaping Use: Never used  Substance Use Topics  . Alcohol use: No  . Drug use: No    Home Medications Prior to Admission medications   Medication Sig Start Date End Date Taking? Authorizing Provider  ascorbic acid (VITAMIN C) 500 MG tablet Take 1 tablet (500 mg total) by mouth  daily. 12/03/19   Hassell Done, Soren-Margaret, FNP  benzonatate (TESSALON PERLES) 100 MG capsule Take 1 capsule (100 mg total) by mouth 3 (three) times daily as needed for cough. 07/07/18   Baruch Gouty, FNP  Calcium-Vitamin D-Vitamin K 500-100-40 MG-UNT-MCG CHEW Chew 1 tablet by mouth 2 (two) times daily.     [provider]  escitalopram (LEXAPRO) 20 MG tablet Take 1 tablet (20 mg total) by mouth daily. 02/22/20   Hassell Done Maeley-Margaret, FNP  ferrous sulfate 325 (65 FE) MG tablet Take 1 tablet (325 mg total) by mouth daily with breakfast.  02/22/20   Hassell Done, Nyeli-Margaret, FNP  levothyroxine (SYNTHROID) 50 MCG tablet Take 1 tablet (50 mcg total) by mouth daily. 02/22/20   Hassell Done, Alee-Margaret, FNP  LORazepam (ATIVAN) 0.5 MG tablet Take 1 tablet (0.5 mg total) by mouth 3 (three) times daily. 02/22/20   Hassell Done, Lylee-Margaret, FNP  lovastatin (MEVACOR) 40 MG tablet Take 1 tablet (40 mg total) by mouth at bedtime. 12/04/19   Hassell Done, Jeffery-Margaret, FNP  nystatin cream (MYCOSTATIN) Apply to affected area 2 times daily 11/20/19   Hassell Done, Lylee-Margaret, FNP  ondansetron (ZOFRAN) 4 MG tablet Take 1 tablet (4 mg total) by mouth every 6 (six) hours as needed for nausea. 11/02/19   Allie Bossier, MD  pantoprazole (PROTONIX) 40 MG tablet Take 1 tablet (40 mg total) by mouth daily before breakfast. 02/22/20   Chevis Pretty, FNP  risperiDONE (RISPERDAL) 0.25 MG tablet TAKE 1 TABLET IN THE MORNING AND 2 TABLETS AT BEDTIME 02/22/20   Chevis Pretty, FNP    Allergies    Doxycycline  Review of Systems   Review of Systems  Constitutional: Negative for chills and fever.  HENT: Negative for ear pain, hearing loss, sore throat and tinnitus.   Eyes: Negative for pain and visual disturbance.  Respiratory: Negative for cough and shortness of breath.   Cardiovascular: Negative for chest pain, palpitations and syncope.  Gastrointestinal: Negative for abdominal pain, blood in stool, diarrhea, nausea and vomiting.  Genitourinary: Negative for dysuria and hematuria.  Musculoskeletal: Negative for arthralgias and back pain.  Skin: Negative for color change and rash.  Neurological: Positive for dizziness, weakness (generalized) and light-headedness. Negative for tremors, seizures, syncope, speech difficulty, numbness and headaches.  All other systems reviewed and are negative.   Physical Exam Updated Vital Signs  ED Triage Vitals  Enc Vitals Group     BP 04/19/20 1134 (!) 147/89     Pulse Rate 04/19/20 1134 67     Resp 04/19/20 1134 14      Temp 04/19/20 1134 98.3 F (36.8 C)     Temp Source 04/19/20 1134 Oral     SpO2 04/19/20 1134 98 %     Weight 04/19/20 1134 214 lb (97.1 kg)     Height 04/19/20 1134 5' 5.5" (1.664 m)     Head Circumference --      Peak Flow --      Pain Score 04/19/20 1158 0     Pain Loc --      Pain Edu? --      Excl. in [place]? --     Physical Exam Vitals and nursing note reviewed.  Constitutional:      General: She is not in acute distress.    Appearance: She is well-developed. She is not ill-appearing.  HENT:     Head: Normocephalic and atraumatic.     Nose: Nose normal.     Mouth/Throat:     Mouth: Mucous membranes  are dry.  Eyes:     Conjunctiva/sclera: Conjunctivae normal.     Pupils: Pupils are equal, round, and reactive to light.  Cardiovascular:     Rate and Rhythm: Normal rate and regular rhythm.     Pulses: Normal pulses.     Heart sounds: Normal heart sounds. No murmur heard.   Pulmonary:     Effort: Pulmonary effort is normal. No respiratory distress.     Breath sounds: Normal breath sounds.  Abdominal:     Palpations: Abdomen is soft.     Tenderness: There is no abdominal tenderness.  Musculoskeletal:        General: No tenderness. Normal range of motion.     Cervical back: Normal range of motion and neck supple.  Skin:    General: Skin is warm and dry.     Capillary Refill: Capillary refill takes less than 2 seconds.  Neurological:     General: No focal deficit present.     Mental Status: She is alert and oriented to person, place, and time.     Cranial Nerves: No cranial nerve deficit.     Sensory: No sensory deficit.     Motor: No weakness.     Comments: 5+ out of 5 strength all, normal sensation, no drift, normal finger-nose-finger, normal speech, no visual field deficit     ED Results / Procedures / Treatments   Labs (all labs ordered are listed, but only abnormal results are displayed) Labs Reviewed  BASIC METABOLIC PANEL - Abnormal; Notable for the  following components:      Result Value   BUN 6 (*)    All other components within normal limits  URINALYSIS, ROUTINE W REFLEX MICROSCOPIC - Abnormal; Notable for the following components:   Color, Urine STRAW (*)    Specific Gravity, Urine 1.004 (*)    All other components within normal limits  SARS CORONAVIRUS 2 BY RT PCR (HOSPITAL ORDER, [facility])  CBC  HEPATIC FUNCTION PANEL  LIPASE, BLOOD  CBG MONITORING, ED  CBG MONITORING, ED    EKG EKG Interpretation  Date/Time:  Saturday April 19 2020 12:05:59 EDT Ventricular Rate:  61 PR Interval:  166 QRS Duration: 80 QT Interval:  436 QTC Calculation: 438 R Axis:   10 Text Interpretation: Normal sinus rhythm Low voltage QRS Cannot rule out Anterior infarct , age undetermined Abnormal ECG Confirmed by Lennice Sites [phone]) on 04/19/2020 1:16:58 PM   Radiology DG Chest Portable 1 View  Result Date: 04/19/2020 CLINICAL DATA:  69 year old female with cough EXAM: PORTABLE CHEST 1 VIEW COMPARISON:  03/27/2016 FINDINGS: Cardiomediastinal silhouette unchanged in size and contour. No pneumothorax. No pleural effusion. No confluent airspace disease. Coarsened interstitial markings, similar to the prior. No displaced fracture IMPRESSION: Chronic changes without evidence of acute cardiopulmonary disease Electronically Signed   By: Corrie Mckusick D.O.   On: 04/19/2020 14:18    Procedures Procedures (including critical care time)  Medications Ordered in ED Medications  sodium chloride flush (NS) 0.9 % injection 3 mL (3 mLs Intravenous Not Given 04/19/20 1358)  sodium chloride 0.9 % bolus 1,000 mL (1,000 mLs Intravenous New Bag/Given 04/19/20 1351)    ED Course  I have reviewed the triage vital signs and the nursing notes.  Pertinent labs & imaging results that were available during my care of the patient were reviewed by me and considered in my medical decision making (see chart for details).    MDM  Rules/Calculators/A&P  MONIQUE HEFTY is a 69 year old female with history of depression, high cholesterol who presents to the ED with generalized weakness, intermittent dizziness. Normal vitals. No fever. Currently asymptomatic. Has felt lightheaded and dizzy intermittently over the past week. Not eating and drinking well. No chest pain, no shortness of breath. Normal neurological exam. No weakness or numbness or facial droop or speech changes. Has had a mild cough. Vaccinated against coronavirus. No significant anemia, electrolyte abnormality, kidney injury. No leukocytosis. No urinary tract infection. EKG shows sinus rhythm. No ischemic changes. Has not had loss of consciousness. Did not have any traumatic falls today or recently. She appears dry on exam. Will give IV fluids. Had low blood pressure when she went to urgent care. Suspect possibly vasovagal, orthostatic process. No concern for stroke. Awaiting hepatic function panel and lipase she has had some upper abdominal pain recently as well anticipate discharge to home.  Covid negative.  Hepatic function panel normal.  Lipase normal.  Overall lab work unremarkable.  Patient discharged in ED in good condition.  Understands return precautions.  This chart was dictated using voice recognition software.  Despite best efforts to proofread,  errors can occur which can change the documentation meaning.    Final Clinical Impression(s) / ED Diagnoses Final diagnoses:  Weakness    Rx / DC Orders ED Discharge Orders    None       Lennice Sites, DO 04/19/20 1527

## 2020-04-21 ENCOUNTER — Other Ambulatory Visit: Payer: Self-pay | Admitting: Nurse Practitioner

## 2020-04-21 DIAGNOSIS — K2100 Gastro-esophageal reflux disease with esophagitis, without bleeding: Secondary | ICD-10-CM

## 2020-05-11 ENCOUNTER — Other Ambulatory Visit: Payer: Self-pay

## 2020-05-11 ENCOUNTER — Emergency Department (HOSPITAL_COMMUNITY): Payer: Medicare Other

## 2020-05-11 ENCOUNTER — Encounter (HOSPITAL_COMMUNITY): Payer: Self-pay | Admitting: Emergency Medicine

## 2020-05-11 ENCOUNTER — Emergency Department (HOSPITAL_COMMUNITY)
Admission: EM | Admit: 2020-05-11 | Discharge: 2020-05-12 | Disposition: A | Discharge status: Home / Self Care | Payer: Medicare Other | Attending: Emergency Medicine | Admitting: Emergency Medicine

## 2020-05-11 DIAGNOSIS — S0990XA Unspecified injury of head, initial encounter: Secondary | ICD-10-CM | POA: Diagnosis present

## 2020-05-11 DIAGNOSIS — Z23 Encounter for immunization: Secondary | ICD-10-CM | POA: Insufficient documentation

## 2020-05-11 DIAGNOSIS — Y9301 Activity, walking, marching and hiking: Secondary | ICD-10-CM | POA: Diagnosis not present

## 2020-05-11 DIAGNOSIS — S0081XA Abrasion of other part of head, initial encounter: Secondary | ICD-10-CM | POA: Insufficient documentation

## 2020-05-11 DIAGNOSIS — R52 Pain, unspecified: Secondary | ICD-10-CM | POA: Diagnosis not present

## 2020-05-11 DIAGNOSIS — R220 Localized swelling, mass and lump, head: Secondary | ICD-10-CM | POA: Diagnosis not present

## 2020-05-11 DIAGNOSIS — S81011A Laceration without foreign body, right knee, initial encounter: Secondary | ICD-10-CM

## 2020-05-11 DIAGNOSIS — W19XXXA Unspecified fall, initial encounter: Secondary | ICD-10-CM | POA: Diagnosis not present

## 2020-05-11 DIAGNOSIS — Z79890 Hormone replacement therapy (postmenopausal): Secondary | ICD-10-CM | POA: Diagnosis not present

## 2020-05-11 DIAGNOSIS — Z041 Encounter for examination and observation following transport accident: Secondary | ICD-10-CM | POA: Diagnosis not present

## 2020-05-11 DIAGNOSIS — W108XXA Fall (on) (from) other stairs and steps, initial encounter: Secondary | ICD-10-CM | POA: Insufficient documentation

## 2020-05-11 DIAGNOSIS — S0181XA Laceration without foreign body of other part of head, initial encounter: Secondary | ICD-10-CM | POA: Diagnosis not present

## 2020-05-11 DIAGNOSIS — Y9289 Other specified places as the place of occurrence of the external cause: Secondary | ICD-10-CM | POA: Diagnosis not present

## 2020-05-11 DIAGNOSIS — Z043 Encounter for examination and observation following other accident: Secondary | ICD-10-CM | POA: Diagnosis not present

## 2020-05-11 DIAGNOSIS — R609 Edema, unspecified: Secondary | ICD-10-CM | POA: Diagnosis not present

## 2020-05-11 DIAGNOSIS — E039 Hypothyroidism, unspecified: Secondary | ICD-10-CM | POA: Diagnosis not present

## 2020-05-11 DIAGNOSIS — I959 Hypotension, unspecified: Secondary | ICD-10-CM | POA: Diagnosis not present

## 2020-05-11 DIAGNOSIS — S80211A Abrasion, right knee, initial encounter: Secondary | ICD-10-CM | POA: Diagnosis not present

## 2020-05-11 DIAGNOSIS — S82101A Unspecified fracture of upper end of right tibia, initial encounter for closed fracture: Secondary | ICD-10-CM

## 2020-05-11 DIAGNOSIS — M25461 Effusion, right knee: Secondary | ICD-10-CM | POA: Diagnosis not present

## 2020-05-11 MED ORDER — IBUPROFEN 400 MG PO TABS
400.0000 mg | ORAL_TABLET | Freq: Once | ORAL | Status: AC
  Administered 2020-05-11: 400 mg via ORAL
  Filled 2020-05-11: qty 1

## 2020-05-11 MED ORDER — LORAZEPAM 0.5 MG PO TABS
0.5000 mg | ORAL_TABLET | Freq: Once | ORAL | Status: AC
  Administered 2020-05-11: 0.5 mg via ORAL
  Filled 2020-05-11: qty 1

## 2020-05-11 MED ORDER — POVIDONE-IODINE 10 % EX SOLN
CUTANEOUS | Status: AC
  Filled 2020-05-11: qty 15

## 2020-05-11 MED ORDER — TETANUS-DIPHTH-ACELL PERTUSSIS 5-2.5-18.5 LF-MCG/0.5 IM SUSP
0.5000 mL | Freq: Once | INTRAMUSCULAR | Status: AC
  Administered 2020-05-11: 0.5 mL via INTRAMUSCULAR
  Filled 2020-05-11: qty 0.5

## 2020-05-11 MED ORDER — LIDOCAINE HCL (PF) 1 % IJ SOLN
30.0000 mL | Freq: Once | INTRAMUSCULAR | Status: AC
  Administered 2020-05-12: 30 mL
  Filled 2020-05-11: qty 30

## 2020-05-11 NOTE — ED Provider Notes (Signed)
[facility] EMERGENCY DEPARTMENT Provider Note   CSN: [id number] Arrival date & time: 05/11/20  1950     History Chief Complaint  Patient presents with  . Fall    Renee Davidson is a 69 y.o. female.  HPI She presents for evaluation of injury to knees, and face, when she fell, walking down some steps.  She reportedly tripped, because of a gait instability, on the last step, falling forward and striking her face on the ground.  She was able to ambulate afterwards with help from bystanders, family members.  On arrival she is alert and conversant.  She came by EMS.  There was no loss of consciousness.  There was no prodrome other than the gait instability.  Patient has no recent illnesses.  There are no other known modifying factors.    Past Medical History:  Diagnosis Date  . Anxiety   . Depression   . GERD (gastroesophageal reflux disease)   . Hyperlipidemia   . Mental developmental delay   . OCD (obsessive compulsive disorder)   . Osteoporosis   . Thyroid disease     Patient Active Problem List   Diagnosis Date Noted  . Ileum ulcer 11/06/2019  . Diverticulosis 11/06/2019  . Acute upper GI bleed 11/01/2019  . Hiatal hernia 11/01/2019  . Cameron lesion, acute 11/01/2019  . Heme + stool   . Acute GI bleeding 10/31/2019  . Obesity (BMI 30-39.9) 10/31/2019  . Acute metabolic encephalopathy 96/22/2979  . Iron deficiency anemia   . Family history of colon cancer   . Family history of colonic polyps   . Symptomatic anemia   . Mental developmental delay   . Advance directive discussed with patient   . Primary insomnia 06/06/2017  . Hypothyroidism 01/17/2014  . Hyperlipidemia with target LDL less than 100 01/17/2014  . GAD (generalized anxiety disorder) 01/17/2014  . GERD (gastroesophageal reflux disease) 01/17/2014  . OCD (obsessive compulsive disorder) 01/17/2014  . Osteoporosis, post-menopausal 12/05/2013    Past Surgical History:  Procedure Laterality Date  .  BIOPSY  11/02/2019   Procedure: BIOPSY;  Surgeon: Gatha Mayer, MD;  Location: [facility];  Service: Endoscopy;;  . COLONOSCOPY WITH PROPOFOL N/A 11/02/2019   Procedure: COLONOSCOPY WITH PROPOFOL;  Surgeon: Gatha Mayer, MD;  Location: [facility];  Service: Endoscopy;  Laterality: N/A;  . ESOPHAGOGASTRODUODENOSCOPY (EGD) WITH PROPOFOL N/A 11/01/2019   Procedure: ESOPHAGOGASTRODUODENOSCOPY (EGD) WITH PROPOFOL;  Surgeon: Gatha Mayer, MD;  Location: [facility];  Service: Endoscopy;  Laterality: N/A;  . TONSILLECTOMY       OB History   No obstetric history on file.     Family History  Problem Relation Age of Onset  . Diabetes Father   . Hyperlipidemia Father   . Hypertension Father   . Atrial fibrillation Father   . Hyperlipidemia Sister   . Dysphagia Sister   . GI problems Mother   . Heart disease Maternal Grandfather   . Colon cancer Maternal Grandfather     Social History   Tobacco Use  . Smoking status: Never Smoker  . Smokeless tobacco: Never Used  Vaping Use  . Vaping Use: Never used  Substance Use Topics  . Alcohol use: No  . Drug use: No    Home Medications Prior to Admission medications   Medication Sig Start Date End Date Taking? Authorizing Provider  ascorbic acid (VITAMIN C) 500 MG tablet Take 1 tablet (500 mg total) by mouth daily. 12/03/19  Yes Chevis Pretty, FNP  benzonatate (TESSALON PERLES) 100 MG capsule Take 1 capsule (100 mg total) by mouth 3 (three) times daily as needed for cough. 07/07/18  Yes Rakes, Connye Burkitt, FNP  escitalopram (LEXAPRO) 20 MG tablet Take 1 tablet (20 mg total) by mouth daily. 02/22/20  Yes Hassell Done, Ritamarie-Margaret, FNP  ferrous sulfate 325 (65 FE) MG tablet Take 1 tablet (325 mg total) by mouth daily with breakfast. 02/22/20  Yes Hassell Done, Saragrace-Margaret, FNP  levothyroxine (SYNTHROID) 50 MCG tablet Take 1 tablet (50 mcg total) by mouth daily. 02/22/20  Yes Martin, Consepcion-Margaret, FNP  LORazepam (ATIVAN) 0.5 MG tablet Take 1  tablet (0.5 mg total) by mouth 3 (three) times daily. 02/22/20  Yes Martin, Fama-Margaret, FNP  lovastatin (MEVACOR) 40 MG tablet Take 1 tablet (40 mg total) by mouth at bedtime. 12/04/19  Yes Hassell Done, Deetta-Margaret, FNP  nystatin cream (MYCOSTATIN) Apply to affected area 2 times daily 11/20/19  Yes Hassell Done, Anastassia-Margaret, FNP  ondansetron (ZOFRAN) 4 MG tablet Take 1 tablet (4 mg total) by mouth every 6 (six) hours as needed for nausea. 11/02/19  Yes Allie Bossier, MD  pantoprazole (PROTONIX) 40 MG tablet TAKE 1 TABLET DAILY 04/21/20  Yes Hassell Done, Tiwana-Margaret, FNP  risperiDONE (RISPERDAL) 0.25 MG tablet TAKE 1 TABLET IN THE MORNING AND 2 TABLETS AT BEDTIME 02/22/20  Yes Hassell Done, Nkechi-Margaret, FNP  Calcium-Vitamin D-Vitamin K 500-100-40 MG-UNT-MCG CHEW Chew 1 tablet by mouth 2 (two) times daily.     [provider]    Allergies    Doxycycline  Review of Systems   Review of Systems  All other systems reviewed and are negative.   Physical Exam Updated Vital Signs BP (!) 129/53 (BP Location: Right Arm)   Pulse 83   Temp 98.2 F (36.8 C) (Oral)   Resp 18   Ht 5\' 5"  (1.651 m)   Wt 98 kg   SpO2 98%   BMI 35.95 kg/m   Physical Exam Vitals and nursing note reviewed.  Constitutional:      General: She is not in acute distress.    Appearance: She is well-developed. She is not ill-appearing, toxic-appearing or diaphoretic.  HENT:     Head: Normocephalic.     Comments: Contusion face, left periorbital with abrasions, and mild swelling.  No active bleeding.  No visible injury to the eye.  No midface instability or trismus.    Right Ear: External ear normal.     Left Ear: External ear normal.     Mouth/Throat:     Mouth: Mucous membranes are moist.  Eyes:     General:        Right eye: No discharge.        Left eye: No discharge.     Conjunctiva/sclera: Conjunctivae normal.     Pupils: Pupils are equal, round, and reactive to light.  Neck:     Trachea: Phonation normal.    Cardiovascular:     Rate and Rhythm: Normal rate and regular rhythm.     Heart sounds: Normal heart sounds.  Pulmonary:     Effort: Pulmonary effort is normal.     Breath sounds: Normal breath sounds.  Chest:     Chest wall: No tenderness.  Abdominal:     General: There is no distension.     Palpations: Abdomen is soft.     Tenderness: There is no abdominal tenderness.  Musculoskeletal:     Cervical back: Normal range of motion and neck supple.     Comments: Large laceration right anterior  knee, intact right knee extension.  No deformity of the right leg.  Normal range of motion both arms and left knee.  Neurovascular intact distally in the right foot.  Skin:    General: Skin is warm and dry.  Neurological:     Mental Status: She is alert and oriented to person, place, and time.     Cranial Nerves: No cranial nerve deficit.     Sensory: No sensory deficit.     Motor: No abnormal muscle tone.     Coordination: Coordination normal.  Psychiatric:        Mood and Affect: Mood normal.        Behavior: Behavior normal.        Thought Content: Thought content normal.        Judgment: Judgment normal.     ED Results / Procedures / Treatments   Labs (all labs ordered are listed, but only abnormal results are displayed) Labs Reviewed - No data to display  EKG None  Radiology CT Head Wo Contrast  Result Date: 05/11/2020 CLINICAL DATA:  Fall with facial/head strike, left periorbital abrasion and swelling EXAM: CT HEAD WITHOUT CONTRAST CT CERVICAL SPINE WITHOUT CONTRAST TECHNIQUE: Multidetector CT imaging of the head and cervical spine was performed following the standard protocol without intravenous contrast. Multiplanar CT image reconstructions of the cervical spine were also generated. COMPARISON:  CT head 10/31/2019, soft tissue neck radiographs 01/16/2016 FINDINGS: CT HEAD FINDINGS Brain: No evidence of acute infarction, hemorrhage, hydrocephalus, extra-axial collection, visible  mass lesion or mass effect. Symmetric prominence of the ventricles, cisterns and sulci compatible with parenchymal volume loss and ex vacuo dilatation of the ventricles. Patchy areas of white matter hypoattenuation are most compatible with chronic microvascular angiopathy. Vascular: Atherosclerotic calcification of the carotid siphons and intradural vertebral arteries. No hyperdense vessel. Skull: Left supraorbital and periorbital soft tissue swelling with palpebral thickening. No acute calvarial fracture or worrisome calvarial lesion. No visible facial bone fracture within the included margins of imaging though portions of the orbits and remaining facial bones are excluded. Sinuses/Orbits: Paranasal sinuses and mastoid air cells are predominantly clear. Middle ear cavities are clear. Left periorbital and palpebral swelling. No retro septal gas, stranding or hemorrhage. Globes appear normal and symmetric. Other: Asymmetric anterior positioning of the right temporomandibular joint, nonspecific though could correlate with clinical findings exam. CT CERVICAL SPINE FINDINGS Imaging quality below the level of C4 is degraded due to photon starvation artifact which limits fine bone and soft-tissue detail. Alignment: Is cervical stabilization collar is absent at the time of examination. There is straightening and slight reversal the normal cervical lordosis possibly related to cervical flexion noted on the scout imaging. Additionally, there is slight rightward cranial rotation. No evidence of traumatic listhesis. No abnormally widened, perched or jumped facets. Bony fusion across the right C7-T1 articular facets is likely on a degenerative basis. Normal alignment of the craniocervical and atlantoaxial articulations. Skull base and vertebrae: No acute skull base fracture. No vertebral body fracture or height loss. Normal bone mineralization. No worrisome osseous lesions. Bony fusion across the right articular facet at C7-T1.  Mild atlantodental arthrosis. Multilevel spondylitic changes, as detailed below. Soft tissues and spinal canal: No pre or paravertebral fluid or swelling. No visible canal hematoma. Disc levels: Multilevel intervertebral disc height loss with spondylitic endplate changes. Multilevel disc desiccations and vacuum disc phenomenon are noted. Several scattered Schmorl's node formations are present. Several disc osteophyte complexes present throughout the cervical spine resulting in some partial effacement  of the ventral thecal sac but without severe canal stenosis. Mild uncinate spurring and facet hypertrophic changes are present throughout the cervical levels as well with mild to moderate multilevel foraminal narrowing and more moderate to severe narrowing bilaterally C4-5. Upper chest: Posterior bowing of the trachea most often related to imaging during exhalation though degree of tracheomalacia cannot be excluded. Some mild septal thickening in the apices could reflect early interstitial edema. Minimal biapical pleuroparenchymal scarring. Cervical carotid atherosclerosis. Other: Diminutive left lobe thyroid gland. Partially calcified, heterogeneous 2 cm nodule in the right lobe thyroid gland. IMPRESSION: CT head 1. No acute intracranial abnormality. 2. Left supraorbital and periorbital soft tissue swelling with palpebral thickening. No calvarial fracture. No globe injury. No visible facial bone fracture within the included margins of imaging though portions of the orbits and remaining facial bones are excluded on this exam. If there is persisting clinical concern, maxillofacial CT should be obtained. 3. Asymmetric anterior positioning of the right temporomandibular joint, nonspecific. Correlate with clinical exam features and for tenderness at the right temporomandibular joint. 4. Mild parenchymal volume loss and chronic microvascular angiopathy. CT cervical spine 1. No evidence of acute fracture or traumatic  listhesis of the cervical spine. 2. Multilevel spondylitic changes of the cervical spine, as described above. 3. Partially calcified, heterogeneous 2 cm nodule in the right lobe thyroid gland. Recommend further evaluation with thyroid ultrasound on a nonemergent basis. (ref: J Am Coll Radiol. 2015 Feb;12(2): 143-50). 4. Posterior bowing of the trachea most often related to imaging during exhalation though degree of tracheomalacia cannot be excluded. 5. Some mild septal thickening in the apices could reflect early interstitial edema. Electronically Signed   By: Lovena Le M.D.   On: 05/11/2020 22:54   CT Cervical Spine Wo Contrast  Result Date: 05/11/2020 CLINICAL DATA:  Fall with facial/head strike, left periorbital abrasion and swelling EXAM: CT HEAD WITHOUT CONTRAST CT CERVICAL SPINE WITHOUT CONTRAST TECHNIQUE: Multidetector CT imaging of the head and cervical spine was performed following the standard protocol without intravenous contrast. Multiplanar CT image reconstructions of the cervical spine were also generated. COMPARISON:  CT head 10/31/2019, soft tissue neck radiographs 01/16/2016 FINDINGS: CT HEAD FINDINGS Brain: No evidence of acute infarction, hemorrhage, hydrocephalus, extra-axial collection, visible mass lesion or mass effect. Symmetric prominence of the ventricles, cisterns and sulci compatible with parenchymal volume loss and ex vacuo dilatation of the ventricles. Patchy areas of white matter hypoattenuation are most compatible with chronic microvascular angiopathy. Vascular: Atherosclerotic calcification of the carotid siphons and intradural vertebral arteries. No hyperdense vessel. Skull: Left supraorbital and periorbital soft tissue swelling with palpebral thickening. No acute calvarial fracture or worrisome calvarial lesion. No visible facial bone fracture within the included margins of imaging though portions of the orbits and remaining facial bones are excluded. Sinuses/Orbits:  Paranasal sinuses and mastoid air cells are predominantly clear. Middle ear cavities are clear. Left periorbital and palpebral swelling. No retro septal gas, stranding or hemorrhage. Globes appear normal and symmetric. Other: Asymmetric anterior positioning of the right temporomandibular joint, nonspecific though could correlate with clinical findings exam. CT CERVICAL SPINE FINDINGS Imaging quality below the level of C4 is degraded due to photon starvation artifact which limits fine bone and soft-tissue detail. Alignment: Is cervical stabilization collar is absent at the time of examination. There is straightening and slight reversal the normal cervical lordosis possibly related to cervical flexion noted on the scout imaging. Additionally, there is slight rightward cranial rotation. No evidence of traumatic listhesis. No abnormally widened,  perched or jumped facets. Bony fusion across the right C7-T1 articular facets is likely on a degenerative basis. Normal alignment of the craniocervical and atlantoaxial articulations. Skull base and vertebrae: No acute skull base fracture. No vertebral body fracture or height loss. Normal bone mineralization. No worrisome osseous lesions. Bony fusion across the right articular facet at C7-T1. Mild atlantodental arthrosis. Multilevel spondylitic changes, as detailed below. Soft tissues and spinal canal: No pre or paravertebral fluid or swelling. No visible canal hematoma. Disc levels: Multilevel intervertebral disc height loss with spondylitic endplate changes. Multilevel disc desiccations and vacuum disc phenomenon are noted. Several scattered Schmorl's node formations are present. Several disc osteophyte complexes present throughout the cervical spine resulting in some partial effacement of the ventral thecal sac but without severe canal stenosis. Mild uncinate spurring and facet hypertrophic changes are present throughout the cervical levels as well with mild to moderate  multilevel foraminal narrowing and more moderate to severe narrowing bilaterally C4-5. Upper chest: Posterior bowing of the trachea most often related to imaging during exhalation though degree of tracheomalacia cannot be excluded. Some mild septal thickening in the apices could reflect early interstitial edema. Minimal biapical pleuroparenchymal scarring. Cervical carotid atherosclerosis. Other: Diminutive left lobe thyroid gland. Partially calcified, heterogeneous 2 cm nodule in the right lobe thyroid gland. IMPRESSION: CT head 1. No acute intracranial abnormality. 2. Left supraorbital and periorbital soft tissue swelling with palpebral thickening. No calvarial fracture. No globe injury. No visible facial bone fracture within the included margins of imaging though portions of the orbits and remaining facial bones are excluded on this exam. If there is persisting clinical concern, maxillofacial CT should be obtained. 3. Asymmetric anterior positioning of the right temporomandibular joint, nonspecific. Correlate with clinical exam features and for tenderness at the right temporomandibular joint. 4. Mild parenchymal volume loss and chronic microvascular angiopathy. CT cervical spine 1. No evidence of acute fracture or traumatic listhesis of the cervical spine. 2. Multilevel spondylitic changes of the cervical spine, as described above. 3. Partially calcified, heterogeneous 2 cm nodule in the right lobe thyroid gland. Recommend further evaluation with thyroid ultrasound on a nonemergent basis. (ref: J Am Coll Radiol. 2015 Feb;12(2): 143-50). 4. Posterior bowing of the trachea most often related to imaging during exhalation though degree of tracheomalacia cannot be excluded. 5. Some mild septal thickening in the apices could reflect early interstitial edema. Electronically Signed   By: Lovena Le M.D.   On: 05/11/2020 22:54   DG Knee Complete 4 Views Left  Result Date: 05/11/2020 CLINICAL DATA:  Fall EXAM: LEFT  KNEE - COMPLETE 4+ VIEW COMPARISON:  None. FINDINGS: Minimal soft tissue thickening anteriorly, possibly contusive. Soft tissue mineralization in the distal quadriceps tendon adjacent the anterosuperior patella, could reflect a fragmented enthesophyte or incomplete mineralization of enthesophytic spur. Should correlate for point tenderness. No other acute fracture or worrisome osseous abnormality is seen. No sizeable joint effusion. IMPRESSION: 1. Discontiguous mineralization in the distal quadriceps tendon adjacent the anterosuperior patella, could reflect a fragmented enthesophyte or incomplete mineralization of enthesophytic spur. Correlate for point tenderness. 2. No other acute osseous abnormality. 3. Minimal soft tissue thickening anteriorly, possibly contusive. Electronically Signed   By: Lovena Le M.D.   On: 05/11/2020 21:39   DG Knee Complete 4 Views Right  Result Date: 05/11/2020 CLINICAL DATA:  Fall EXAM: RIGHT KNEE - COMPLETE 4+ VIEW COMPARISON:  None. FINDINGS: Soft tissue swelling and thickening anteriorly with few foci of soft tissue gas and likely some overlying laceration. No  evident discontinuity of the distal quadriceps or patellar tendon. Small suprapatellar joint effusion is noted. There is a questionable cortical step-off along the posterolateral corner of the tibia best seen on lateral radiograph. No other acute fracture or traumatic malalignment. Mild tricompartmental degenerative changes. Corticated fabella posteriorly. Surgical clips in the posterior soft tissues as well. IMPRESSION: 1. Soft tissue swelling and thickening anteriorly with few foci of soft tissue gas and likely some overlying laceration. No evident discontinuity of the distal quadriceps or patellar tendon. 2. Possible cortical step-off along the posterolateral corner of the tibia best seen on lateral radiograph, suspicious for a minimally displaced fracture. 3. Small suprapatellar joint effusion. Electronically Signed    By: Lovena Le M.D.   On: 05/11/2020 21:26    Procedures .Marland KitchenLaceration Repair  Date/Time: 05/11/2020 11:48 PM Performed by: Daleen Bo, MD Authorized by: Daleen Bo, MD   Consent:    Consent obtained:  Verbal   Consent given by:  Patient   Risks discussed:  Infection, pain, poor cosmetic result, poor wound healing and need for additional repair   Alternatives discussed:  No treatment Anesthesia (see MAR for exact dosages):    Anesthesia method:  Local infiltration   Local anesthetic:  Lidocaine 1% w/o epi Laceration details:    Location:  Leg   Leg location:  R knee   Length (cm):  13.5   Depth (mm):  9 Repair type:    Repair type:  Intermediate Pre-procedure details:    Preparation:  Patient was prepped and draped in usual sterile fashion and imaging obtained to evaluate for foreign bodies Exploration:    Hemostasis achieved with:  Direct pressure   Wound exploration: wound explored through full range of motion     Wound extent: no areolar tissue violation noted, no fascia violation noted, no foreign bodies/material noted, no muscle damage noted, no nerve damage noted, no tendon damage noted, no underlying fracture noted and no vascular damage noted     Contaminated: no   Treatment:    Area cleansed with:  Betadine   Amount of cleaning:  Standard   Irrigation solution:  Sterile saline   Irrigation volume:  75 cc   Irrigation method:  Syringe   Visualized foreign bodies/material removed: no   Subcutaneous repair:    Suture size:  3-0   Suture material:  Vicryl   Suture technique:  Vertical mattress   Number of sutures:  9 Skin repair:    Repair method:  Staples   Number of staples:  15 Approximation:    Approximation:  Loose Post-procedure details:    Dressing:  Antibiotic ointment, non-adherent dressing, sterile dressing, bulky dressing and splint for protection   Patient tolerance of procedure:  Tolerated well, no immediate complications   (including  critical care time)  Medications Ordered in ED Medications  lidocaine (PF) (XYLOCAINE) 1 % injection 30 mL (has no administration in time range)  povidone-iodine (BETADINE) 10 % external solution (has no administration in time range)  povidone-iodine (BETADINE) 10 % external solution (has no administration in time range)  Tdap (BOOSTRIX) injection 0.5 mL (0.5 mLs Intramuscular Given 05/11/20 2203)  LORazepam (ATIVAN) tablet 0.5 mg (0.5 mg Oral Given 05/11/20 2203)  ibuprofen (ADVIL) tablet 400 mg (400 mg Oral Given 05/11/20 2203)    ED Course  I have reviewed the triage vital signs and the nursing notes.  Pertinent labs & imaging results that were available during my care of the patient were reviewed by me and considered in  my medical decision making (see chart for details).    MDM Rules/Calculators/A&P                           Patient Vitals for the past 24 hrs:  BP Temp Temp src Pulse Resp SpO2 Height Weight  05/11/20 2001 (!) 129/53 98.2 F (36.8 C) Oral 83 18 98 % -- --  05/11/20 1958 -- -- -- -- -- -- 5\' 5"  (1.651 m) 98 kg    12:00 AM Reevaluation with update and discussion. After initial assessment and treatment, an updated evaluation reveals she remains comfortable has no further complaints, findings discussed with patient and daughter, all questions were answered. Daleen Bo   Medical Decision Making:  This patient is presenting for evaluation of injuries from fall, which does require a range of treatment options, and is a complaint that involves a high risk of morbidity and mortality. The differential diagnoses include intracranial injury, neck injury, knee fracture, wound care. I decided to review old records, and in summary elderly female walking, visualized mechanical fall, presenting with normal mental status.  I obtained additional historical information from family member with her.   Radiologic Tests Ordered, included CT head, CT cervical spine, bilateral knee  images.  I independently Visualized: Radiographic images, which show no acute injuries to head or neck.  Possible chip fracture posterior lateral right tibia.  No fracture left knee.    Critical Interventions-clinical evaluation, radiography, observation, laceration repair, right knee splint/immobilizer  After These Interventions, the Patient was reevaluated and was found stable for discharge.  Right knee wound closed by me, 2 layer required; immobilizer placed to stabilize knee.  Follow-up treatment plan discussed with family member at bedside  CRITICAL CARE-no Performed by: Daleen Bo  Nursing Notes Reviewed/ Care Coordinated Applicable Imaging Reviewed Interpretation of Laboratory Data incorporated into ED treatment  The patient appears reasonably screened and/or stabilized for discharge and I doubt any other medical condition or other [facility] requiring further screening, evaluation, or treatment in the ED at this time prior to discharge.  Plan: Home Medications-continue usual medications, Tylenol for pain; Home Treatments-wound care at home, knee immobilizer when up; return here if the recommended treatment, does not improve the symptoms; Recommended follow up-orthopedic follow-up 1 week, sutures out 10 to 14 days     Final Clinical Impression(s) / ED Diagnoses Final diagnoses:  Knee laceration, right, initial encounter  Closed fracture of proximal end of right tibia, unspecified fracture morphology, initial encounter  Injury of head, initial encounter  Abrasion of face, initial encounter    Rx / DC Orders ED Discharge Orders    None       Daleen Bo, MD 05/12/20 0000

## 2020-05-11 NOTE — Discharge Instructions (Signed)
Your injuries are primarily consisting of contusions and laceration.  The x-ray of the right knee showed a possible chip fracture on the large bone that will need follow-up with an orthopedic doctor.  It is okay to bear weight, on your right foot while wearing the knee immobilizer.  Remove the knee immobilizer at least twice a day, to clean the wound with soap and water.  Watch for signs of infection including pain, drainage, swelling.  Return here, if needed.  For pain Tylenol should work.

## 2020-05-11 NOTE — ED Triage Notes (Signed)
Pt visiting sister and was leaving to go back to her residence when she fell down a step hitting landing face-forward on concrete. No LOC. Pt with abrasion to L eye and swelling, abrasion to bridge of nose, and a large laceration to R knee.

## 2020-05-12 DIAGNOSIS — S81011A Laceration without foreign body, right knee, initial encounter: Secondary | ICD-10-CM | POA: Diagnosis not present

## 2020-05-12 NOTE — ED Notes (Signed)
Pt wheeled to waiting room. Pt verbalized understanding of discharge instructions.   

## 2020-05-14 ENCOUNTER — Telehealth: Payer: Self-pay

## 2020-05-14 NOTE — Telephone Encounter (Signed)
Sister states she has appointment and no other appointment needed.

## 2020-05-16 ENCOUNTER — Encounter: Payer: Self-pay | Admitting: Orthopedic Surgery

## 2020-05-16 ENCOUNTER — Other Ambulatory Visit: Payer: Self-pay

## 2020-05-16 ENCOUNTER — Ambulatory Visit (INDEPENDENT_AMBULATORY_CARE_PROVIDER_SITE_OTHER): Payer: Medicare Other | Admitting: Orthopedic Surgery

## 2020-05-16 VITALS — BP 120/70 | HR 65 | Ht 65.0 in

## 2020-05-16 DIAGNOSIS — S82141A Displaced bicondylar fracture of right tibia, initial encounter for closed fracture: Secondary | ICD-10-CM | POA: Diagnosis not present

## 2020-05-16 DIAGNOSIS — Z23 Encounter for immunization: Secondary | ICD-10-CM | POA: Diagnosis not present

## 2020-05-16 NOTE — Patient Instructions (Signed)
1.  We removed your staples and placed steri-strips over the injury.  The steri-strips can fall off whenever.  They do not need to be replaced.  2.  Please keep your knee straight at all times to allow your cut to continue to heal 3.  Follow up in 1 week for repeat evaluation

## 2020-05-16 NOTE — Progress Notes (Signed)
New Patient Visit  Assessment: Renee Davidson is a 69 y.o. female with the following: 1.  Right tibial plateau fracture  2.  Anterior right knee laceration   Plan: Renee Davidson sustained a nondisplaced fracture of the posterior aspect of the right tibial plateau. We will continue to treat this without surgery. My biggest concern at this time is the anterior knee laceration that was closed with staples in the emergency department. Upon evaluation today, it is healing very well. The staples were removed in clinic today, and Steri-Strips were placed. I do want to continue to monitor this laceration to ensure that it heals well, prior to allowing her to start working on range of motion of her right knee. We will see her in 1 week for repeat evaluation. If the laceration continues to heal well and look appropriate, we will allow her to initiate range of motion without advancing her weightbearing.  According to family in the room, she is also having some issues with her breathing, as well as balance issues. I have urged them to contact her primary care provider to discuss these in more detail.  Follow-up: Return in about 1 week (around 05/23/2020).  Subjective:  Chief Complaint  Patient presents with  . Leg Pain    right knee pain had a fall 05/11/20.     History of Present Illness: Renee Davidson is a 69 y.o. female who presents for evaluation of her right knee. She sustained a fall approximately 1 week ago, and was evaluated in the emergency department. She was noted to have a minimally displaced tibial plateau fracture in the posterior aspect of her right knee, as well as an overlying right knee laceration. The laceration was closed with staples. She has been using a knee immobilizer since this visit. Overall, she has done well. Her pain continues to be well controlled. She has not been ambulating on the right leg. She also hit her head, but continues to recover without obvious injury.  Review of  Systems: No numbness or tingling No fevers or chills Shortness of breath No chest pain  Medical History:  Past Medical History:  Diagnosis Date  . Anxiety   . Depression   . GERD (gastroesophageal reflux disease)   . Hyperlipidemia   . Mental developmental delay   . OCD (obsessive compulsive disorder)   . Osteoporosis   . Thyroid disease     Past Surgical History:  Procedure Laterality Date  . BIOPSY  11/02/2019   Procedure: BIOPSY;  Surgeon: Gatha Mayer, MD;  Location: [facility];  Service: Endoscopy;;  . COLONOSCOPY WITH PROPOFOL N/A 11/02/2019   Procedure: COLONOSCOPY WITH PROPOFOL;  Surgeon: Gatha Mayer, MD;  Location: [facility];  Service: Endoscopy;  Laterality: N/A;  . ESOPHAGOGASTRODUODENOSCOPY (EGD) WITH PROPOFOL N/A 11/01/2019   Procedure: ESOPHAGOGASTRODUODENOSCOPY (EGD) WITH PROPOFOL;  Surgeon: Gatha Mayer, MD;  Location: [facility];  Service: Endoscopy;  Laterality: N/A;  . TONSILLECTOMY      Family History  Problem Relation Age of Onset  . Diabetes Father   . Hyperlipidemia Father   . Hypertension Father   . Atrial fibrillation Father   . Hyperlipidemia Sister   . Dysphagia Sister   . GI problems Mother   . Heart disease Maternal Grandfather   . Colon cancer Maternal Grandfather    Social History   Tobacco Use  . Smoking status: Never Smoker  . Smokeless tobacco: Never Used  Vaping Use  . Vaping Use: Never used  Substance Use Topics  . Alcohol use: No  . Drug use: No    Allergies  Allergen Reactions  . Doxycycline Rash    Current Meds  Medication Sig  . ascorbic acid (VITAMIN C) 500 MG tablet Take 1 tablet (500 mg total) by mouth daily.  . benzonatate (TESSALON PERLES) 100 MG capsule Take 1 capsule (100 mg total) by mouth 3 (three) times daily as needed for cough.  . Calcium-Vitamin D-Vitamin K 500-100-40 MG-UNT-MCG CHEW Chew 1 tablet by mouth 2 (two) times daily.   Marland Kitchen escitalopram (LEXAPRO) 20 MG tablet Take 1 tablet (20 mg  total) by mouth daily.  . ferrous sulfate 325 (65 FE) MG tablet Take 1 tablet (325 mg total) by mouth daily with breakfast.  . levothyroxine (SYNTHROID) 50 MCG tablet Take 1 tablet (50 mcg total) by mouth daily.  Marland Kitchen LORazepam (ATIVAN) 0.5 MG tablet Take 1 tablet (0.5 mg total) by mouth 3 (three) times daily.  Marland Kitchen lovastatin (MEVACOR) 40 MG tablet Take 1 tablet (40 mg total) by mouth at bedtime.  Marland Kitchen nystatin cream (MYCOSTATIN) Apply to affected area 2 times daily  . ondansetron (ZOFRAN) 4 MG tablet Take 1 tablet (4 mg total) by mouth every 6 (six) hours as needed for nausea.  . pantoprazole (PROTONIX) 40 MG tablet TAKE 1 TABLET DAILY  . risperiDONE (RISPERDAL) 0.25 MG tablet TAKE 1 TABLET IN THE MORNING AND 2 TABLETS AT BEDTIME    Objective: BP 120/70   Pulse 65   Ht 5\' 5"  (1.651 m)   BMI 35.95 kg/m   Physical Exam:  General: Alert and oriented. No acute distress. Seated in a wheelchair Gait: Unable to ambulate  Restricted cervical ROM.  Negative Hoffmann's bilaterally.   Evaluation of the right knee demonstrates an oblique laceration over the anterior aspect of the knee. There is no surrounding redness or erythema. No active drainage. Minimal tenderness to palpation. She does have some tenderness palpation of the posterior aspect of her knee. Given the presence of the laceration, range of motion testing was deferred. Her sensation is intact distally. She is able to dorsiflex her ankle and great toe. Toes are warm and well perfused.  The left knee has small superficial abrasions with no tenderness.    IMAGING: I personally reviewed the following images:  XR of the left knee demonstrates a minimally displaced fracture at the posterior aspect of the plateau.  Minimally displaced.  CT scan of the neck demonstrates significant degenerative changes, most severe C5-6.  New Medications:  No orders of the defined types were placed in this encounter.     Mordecai Rasmussen,  MD  05/16/2020 11:17 AM

## 2020-05-22 ENCOUNTER — Ambulatory Visit: Payer: Medicare Other | Admitting: Family Medicine

## 2020-05-23 ENCOUNTER — Other Ambulatory Visit: Payer: Self-pay

## 2020-05-23 ENCOUNTER — Ambulatory Visit (INDEPENDENT_AMBULATORY_CARE_PROVIDER_SITE_OTHER): Payer: Medicare Other | Admitting: Orthopedic Surgery

## 2020-05-23 ENCOUNTER — Ambulatory Visit: Payer: Medicare Other | Admitting: Nurse Practitioner

## 2020-05-23 ENCOUNTER — Encounter: Payer: Self-pay | Admitting: Orthopedic Surgery

## 2020-05-23 VITALS — BP 131/68 | HR 80 | Ht 65.0 in | Wt 216.0 lb

## 2020-05-23 DIAGNOSIS — S82141D Displaced bicondylar fracture of right tibia, subsequent encounter for closed fracture with routine healing: Secondary | ICD-10-CM

## 2020-05-23 DIAGNOSIS — M545 Low back pain, unspecified: Secondary | ICD-10-CM

## 2020-05-23 NOTE — Progress Notes (Signed)
Orthopaedic Clinic Return  Assessment: Renee Davidson is a 69 y.o. female with the following: 1.  Right tibial plateau fracture  2.  Anterior right knee laceration   Plan: Renee Davidson continues to improve.  Her pain is significantly improved.  Overall, she is doing much better.  She started to resume some of her usual activities.  She is just recently returned to her assisted living facility, and has stayed there on her own.  Her laceration continues to improve.  At this point in time, I see no point in limiting her function.  I have advised them to continue to use the rolling walker, to assist with ambulation.  I do not feel that we have to limit her weightbearing at this time.  We have provided her with a prescription for rolling walker with a seat.  In addition, they have asked for a referral to a spine surgeon as she has fair amount of stiffness in her neck, and the CT scan demonstrated some signs of stenosis after the fall.  We will plan to see them back in approximately 2 weeks for repeat evaluation.  We will obtain updated x-rays at that time.  If they have any issues in the interim, I have asked him to contact clinic.   Body mass index is 35.94 kg/m.  Follow-up: Return in about 2 weeks (around 06/06/2020).  Please obtain right knee x-rays at the next visit  Subjective:  Chief Complaint  Patient presents with  . Knee Pain    some knee pain, took tylonel,     History of Present Illness: Renee Davidson is a 69 y.o. female who returns to clinic today for repeat evaluation of her right knee.  She sustained a minimally displaced fracture of the tibial plateau with an overlying laceration.  Very well since the last visit.  She states that removing the staples at last visit improved her pain significantly.  She has started to walk using a rolling walker.  She reports that she has minimal pain when she is walking.  She does occasionally take Tylenol.  She has been working on range of motion, and  can easily bend beyond 90 degrees.  She continues to have some pain and stiffness in her neck, and is interested in a referral to a spine surgeon.  Review of Systems: No fevers or chills No numbness or tingling No chest pain No shortness of breath  Objective: BP 131/68   Pulse 80   Ht 5\' 5"  (1.651 m)   Wt 216 lb (98 kg)   BMI 35.94 kg/m   Physical Exam:  Alert and oriented, no acute distress Walking with the assistance of a walker  The laceration over the anterior knee is healing well.  There is no surrounding erythema or drainage.  The Steri-Strips remain in place, with some residual scabbing.  She is able to achieve full extension.  She comfortably flexes her knee beyond 110 degrees.  Minimal tenderness in the posterior aspect of her knee.  She does have some pain and tenderness to palpation over the anterior lateral aspect of her knee.  Otherwise the knee is stable to varus and valgus stress.  Negative Lachman.  Sensation is intact distally.  Active motion intact in the TA/EHL.  IMAGING: I personally ordered and reviewed the following images:   No new imaging obtained.  Renee Rasmussen, MD 05/23/2020 8:46 AM

## 2020-05-23 NOTE — Patient Instructions (Addendum)
1.  Referral to spine surgeon  2. Prescription for a rolling walker with a seat 3. Follow up 2 weeks, repeat XR at that time

## 2020-06-03 ENCOUNTER — Other Ambulatory Visit: Payer: Self-pay

## 2020-06-03 ENCOUNTER — Encounter: Payer: Self-pay | Admitting: Orthopedic Surgery

## 2020-06-03 ENCOUNTER — Ambulatory Visit (INDEPENDENT_AMBULATORY_CARE_PROVIDER_SITE_OTHER): Payer: Medicare Other | Admitting: Orthopedic Surgery

## 2020-06-03 ENCOUNTER — Ambulatory Visit: Payer: Medicare Other

## 2020-06-03 DIAGNOSIS — S82141D Displaced bicondylar fracture of right tibia, subsequent encounter for closed fracture with routine healing: Secondary | ICD-10-CM

## 2020-06-03 DIAGNOSIS — L03115 Cellulitis of right lower limb: Secondary | ICD-10-CM

## 2020-06-03 MED ORDER — CEPHALEXIN 500 MG PO CAPS: 500.0000 mg | ORAL_CAPSULE | Freq: Two times a day (BID) | ORAL | 0 refills | Status: AC

## 2020-06-03 NOTE — Progress Notes (Signed)
Orthopaedic Clinic Return  Assessment: Renee Davidson is a 69 y.o. female with the following: 1.  Right knee cellulitis; previous laceration after a fall 2.  Right tibial plateau fracture, healing well   Plan: Renee Davidson return to clinic today earlier than previously scheduled due to some redness around the previous laceration.  She also had some associated increase in pain.  Based on the appearance, not concerned for an associated abscess, however I am concerned that she is developing some cellulitis.  As result, I provided her with a prescription for Keflex.  In addition, I have asked her to wear the knee immobilizer for the rest of the week, to minimize any irritation in this area.  If they have any issues with medication, or the redness does not improve, have asked him to contact the clinic for repeat evaluation.  Otherwise, her underlying fracture is healing well, and we do not need to schedule follow-up appointment at this time.  Meds ordered this encounter  Medications  . cephALEXin (KEFLEX) 500 MG capsule    Sig: Take 1 capsule (500 mg total) by mouth 2 (two) times daily for 10 days.    Dispense:  20 capsule    Refill:  0    There is no height or weight on file to calculate BMI.  Follow-up: Return if symptoms worsen or fail to improve.   Subjective:  Chief Complaint  Patient presents with  . Post-op Problem    right knee redness     History of Present Illness: Renee Davidson is a 69 y.o. female who returns to clinic today for repeat evaluation.  Briefly, she sustained a minimally displaced right tibial plateau fracture with an overlying laceration several weeks ago.  She has done well to this point.  Over the last 2 to 3 days she has noticed increased redness and some associated pain overlying the right knee.  No drainage was noted.  She has taken some ibuprofen, which did provide some relief.  She has not had any fevers or chills.  Review of Systems: No fevers or chills No  numbness or tingling No chest pain No SOB  Objective: There were no vitals taken for this visit.  Physical Exam:  Alert and oriented, no acute distress Redness over the anterior knee surrounding healing laceration.  Mild TTP over the lateral aspect of laceration.  No areas of induration or fluctuance.  Tolerates full ROM without discomfort.   IMAGING: I personally ordered and reviewed the following images:   X-ray of the right knee was obtained in clinic today and demonstrates good overall alignment. Well maintained joint space.  There has been no further displacement of the posterior tibial plateau fracture.  Impression: healing right posterior tibial plateau fracture    Mordecai Rasmussen, MD 06/03/2020 9:07 AM

## 2020-06-04 ENCOUNTER — Telehealth: Payer: Self-pay | Admitting: Orthopedic Surgery

## 2020-06-04 MED ORDER — SULFAMETHOXAZOLE-TRIMETHOPRIM 800-160 MG PO TABS: 1.0000 | ORAL_TABLET | Freq: Two times a day (BID) | ORAL | 0 refills | Status: DC

## 2020-06-04 NOTE — Telephone Encounter (Signed)
Thank you for the update.  I will send another antibiotic to the pharmacy listed on file.  Please continue to update [place] with any further issues.     Callan Yontz A. Amedeo Kinsman, MD [facility] [address] [place],  [place]  [address] Phone: [phone] Fax: [phone]

## 2020-06-04 NOTE — Telephone Encounter (Signed)
Patient sister was updated and did not have any questions at this time. Dr. Amedeo Kinsman did advised for the patient to stop the Keflex and start the Bactrim. It was repeated back to me. No other concerns.

## 2020-06-04 NOTE — Telephone Encounter (Signed)
Renee Davidson did not tolerate Keflex for her right knee cellulitis.  She has requested an updated prescription.  Bactrim has been sent to her pharmacy.  If she has any issues, she can contact the clinic again.

## 2020-06-04 NOTE — Telephone Encounter (Signed)
Ms. Kilts and her sister called this morning stating they think she is allergic to the Keflex that she was given yesterday.  She said that overnight and this morning Ms. Atteberry has had a really bad upset stomach and they think the Keflex is the reason why.  They are asking for Dr. Amedeo Kinsman to send in a different antibiotic to [facility]

## 2020-06-04 NOTE — Telephone Encounter (Signed)
I called the patient sister who was on the DPR and reports that she had fallen and the facility and fell on her bottom. She is not having any other symptoms at this time. The patient is not complaining of pain but the sister thinks she may need something else for the antibiotic. Please advise.

## 2020-06-05 ENCOUNTER — Ambulatory Visit (INDEPENDENT_AMBULATORY_CARE_PROVIDER_SITE_OTHER): Payer: Medicare Other | Admitting: Nurse Practitioner

## 2020-06-05 ENCOUNTER — Ambulatory Visit: Payer: Medicare Other

## 2020-06-05 ENCOUNTER — Encounter: Payer: Self-pay | Admitting: Nurse Practitioner

## 2020-06-05 ENCOUNTER — Other Ambulatory Visit: Payer: Self-pay

## 2020-06-05 VITALS — BP 106/65 | HR 62 | Temp 97.5°F | Resp 20 | Ht 65.0 in | Wt 222.0 lb

## 2020-06-05 DIAGNOSIS — L03115 Cellulitis of right lower limb: Secondary | ICD-10-CM | POA: Diagnosis not present

## 2020-06-05 DIAGNOSIS — E041 Nontoxic single thyroid nodule: Secondary | ICD-10-CM

## 2020-06-05 DIAGNOSIS — R350 Frequency of micturition: Secondary | ICD-10-CM

## 2020-06-05 LAB — URINALYSIS
Bilirubin, UA: NEGATIVE
Glucose, UA: NEGATIVE
Ketones, UA: NEGATIVE
Nitrite, UA: NEGATIVE
Protein,UA: NEGATIVE
RBC, UA: NEGATIVE
Specific Gravity, UA: 1.01 (ref 1.005–1.030)
Urobilinogen, Ur: 0.2 mg/dL (ref 0.2–1.0)
pH, UA: 6 (ref 5.0–7.5)

## 2020-06-05 NOTE — Patient Instructions (Signed)

## 2020-06-05 NOTE — Progress Notes (Addendum)
Subjective:    Patient ID: Renee Davidson, female    DOB: August 30, 1950, 69 y.o.   MRN: [id number]   Chief Complaint: Urinary Tract Infection   HPI Fall several weeks ago on 10 October, was seen in ER for right knee injury. Ortho f/u since, Took 2 Keflex & fell again the next morning. Family believed abx was too strong and was changed to Bactrim, which she is currently taking as prescribed. Today was 2nd dose of Bactrim. 10/10 pain in right knee, redness and swelling noted. Ambulatory. Last seen at ortho on Monday, Nov 1. Urinary frequency & urgency x 2 weeks. No burning, pain, discharge, or bleeding. Family would like to rule out UTI.     Review of Systems  Constitutional: Negative.   HENT: Negative.   Eyes: Negative.   Respiratory: Negative.   Cardiovascular: Negative.   Gastrointestinal: Negative.   Endocrine: Negative.   Genitourinary: Positive for difficulty urinating, frequency and urgency.  Musculoskeletal: Positive for arthralgias.       Right knee pain due to injury  Allergic/Immunologic: Negative.   Hematological: Negative.   Psychiatric/Behavioral: Negative.   All other systems reviewed and are negative.      Objective:   Physical Exam Vitals and nursing note reviewed.  Constitutional:      Appearance: Normal appearance.  HENT:     Head: Normocephalic and atraumatic.     Right Ear: External ear normal.     Left Ear: External ear normal.     Nose: Nose normal.     Mouth/Throat:     Mouth: Mucous membranes are moist.     Pharynx: Oropharynx is clear.  Eyes:     Pupils: Pupils are equal, round, and reactive to light.  Cardiovascular:     Rate and Rhythm: Normal rate.     Pulses: Normal pulses.          Posterior tibial pulses are 2+ on the right side and 2+ on the left side.  Pulmonary:     Effort: Pulmonary effort is normal.  Abdominal:     General: Abdomen is flat.  Musculoskeletal:        General: Swelling present. Normal range of motion.     Cervical  back: Normal range of motion.     Right knee: Swelling and erythema present. Tenderness present over the lateral joint line.     Left knee: Normal.     Right lower leg: 1+ Edema present.     Left ankle: Normal.       Legs:     Comments: Healing scar, previous 14 staples to right knee. Slight redness & swelling noted to right knee. Warm to touch.  Skin:    General: Skin is warm.     Capillary Refill: Capillary refill takes less than 2 seconds.  Neurological:     General: No focal deficit present.     Mental Status: She is alert and oriented to person, place, and time. Mental status is at baseline.  Psychiatric:        Mood and Affect: Mood normal.        Behavior: Behavior normal.        Thought Content: Thought content normal.        Judgment: Judgment normal.    BP 106/65   Pulse 62   Temp (!) 97.5 F (36.4 C) (Temporal)   Resp 20   Ht '5\' 5"'  (1.651 m)   Wt 222 lb (100.7 kg)   SpO2  95%   BMI 36.94 kg/m        Assessment & Plan:  Garvin Fila Groom in today with chief complaint of Urinary Frequency, ? cellulitis in right knee (Currently taking antibiotics from ortho), and Fever yesterday   1. Frequent urination Will do urine culture Is already on bactrim - Urinalysis  2. Cellulitis of right knee Continue bactrim as rx Ret Ice BID  elevate  * ER found incidental thyroid nodule when seen for her fall. They are recommending thyroid U/S  The above assessment and management plan was discussed with the patient. The patient verbalized understanding of and has agreed to the management plan. Patient is aware to call the clinic if symptoms persist or worsen. Patient is aware when to return to the clinic for a follow-up visit. Patient educated on when it is appropriate to go to the emergency department.   Michaelina-Margaret Hassell Done, FNP

## 2020-06-05 NOTE — Addendum Note (Signed)
Addended by: Chevis Pretty on: 06/05/2020 10:18 AM   Modules accepted: Orders

## 2020-06-06 ENCOUNTER — Ambulatory Visit: Payer: Medicare Other | Admitting: Orthopedic Surgery

## 2020-06-07 LAB — URINE CULTURE: Organism ID, Bacteria: NO GROWTH

## 2020-06-13 ENCOUNTER — Ambulatory Visit (INDEPENDENT_AMBULATORY_CARE_PROVIDER_SITE_OTHER): Payer: Medicare Other | Admitting: Orthopedic Surgery

## 2020-06-13 ENCOUNTER — Ambulatory Visit: Payer: Medicare Other

## 2020-06-13 ENCOUNTER — Encounter: Payer: Self-pay | Admitting: Orthopedic Surgery

## 2020-06-13 ENCOUNTER — Ambulatory Visit (HOSPITAL_COMMUNITY)
Admission: RE | Admit: 2020-06-13 | Discharge: 2020-06-13 | Disposition: A | Discharge status: Home / Self Care | Payer: Medicare Other | Source: Ambulatory Visit | Attending: Nurse Practitioner | Admitting: Nurse Practitioner

## 2020-06-13 ENCOUNTER — Other Ambulatory Visit: Payer: Self-pay

## 2020-06-13 VITALS — BP 137/64 | HR 69

## 2020-06-13 DIAGNOSIS — S82141D Displaced bicondylar fracture of right tibia, subsequent encounter for closed fracture with routine healing: Secondary | ICD-10-CM

## 2020-06-13 DIAGNOSIS — L03115 Cellulitis of right lower limb: Secondary | ICD-10-CM

## 2020-06-13 DIAGNOSIS — E041 Nontoxic single thyroid nodule: Secondary | ICD-10-CM | POA: Diagnosis not present

## 2020-06-13 NOTE — Progress Notes (Signed)
Orthopaedic Clinic Return  Assessment: Renee Davidson is a 69 y.o. female with the following: 1.  Right knee cellulitis; previous laceration after a fall 2.  Right tibial plateau fracture, healing well   Plan: Renee Davidson return to clinic for a wound check of her right anterior knee laceration, which demonstrated cellulitis at the last visit.  The surrounding redness is almost completely resolved at this time.  Her pain continues to improve.  She has near full range of motion of the right knee without discomfort.  No signs of infection.  As a result, she will continue her current course of antibiotics, and continue with her activities as tolerated.  I recommend she continue to use the rolling walker to prevent falls in the future.  She stated her understanding.  No follow-up is needed at this time.  If her symptoms worsen, I have asked her to return to clinic for follow-up appointment.  Follow-up: No follow-ups on file.   Subjective:  Chief Complaint  Patient presents with  . Knee Injury    much better, about to finish antibiotics     History of Present Illness: Renee Davidson is a 69 y.o. female who returns to clinic today for repeat evaluation.  She was last seen in clinic approximately 2 weeks ago, at which time she had some anterior knee redness, surrounding the previous laceration.  She was initially prescribed Keflex, but did not tolerate this medication.  She was subsequently switched to Bactrim, and has Davidson well.  She is on was Davidson with a prescription.  She feels that the redness has significantly improved.  Her pain has also improved.    Review of Systems: No fevers or chills No numbness or tingling No chest pain No SOB  Objective: BP 137/64   Pulse 69   Physical Exam:  Alert and oriented, no acute distress  Right knee laceration continues to heal.  Previous surrounding erythema has almost completely resolved.  There is no tenderness to palpation.  She is able to achieve  greater than 110 degrees of flexion without discomfort.   IMAGING: I personally ordered and reviewed the following images:   No new imaging obtained today.  Mordecai Rasmussen, MD 06/13/2020 12:39 PM

## 2020-06-16 ENCOUNTER — Other Ambulatory Visit (HOSPITAL_COMMUNITY): Payer: Medicare Other

## 2020-06-16 ENCOUNTER — Telehealth: Payer: Self-pay | Admitting: Nurse Practitioner

## 2020-06-16 ENCOUNTER — Other Ambulatory Visit: Payer: Self-pay | Admitting: Nurse Practitioner

## 2020-06-16 DIAGNOSIS — E041 Nontoxic single thyroid nodule: Secondary | ICD-10-CM

## 2020-06-16 NOTE — Telephone Encounter (Signed)
calling about getting a fine needle aspiration set up, there is an ultrasound report on Friday that states that she needs more tests done and they would like a call back on how to set this up for the pt

## 2020-06-20 DIAGNOSIS — S3210XA Unspecified fracture of sacrum, initial encounter for closed fracture: Secondary | ICD-10-CM | POA: Diagnosis not present

## 2020-06-20 DIAGNOSIS — W19XXXA Unspecified fall, initial encounter: Secondary | ICD-10-CM | POA: Diagnosis not present

## 2020-06-20 DIAGNOSIS — M545 Low back pain, unspecified: Secondary | ICD-10-CM | POA: Diagnosis not present

## 2020-06-20 DIAGNOSIS — E079 Disorder of thyroid, unspecified: Secondary | ICD-10-CM | POA: Diagnosis not present

## 2020-06-20 DIAGNOSIS — Z79899 Other long term (current) drug therapy: Secondary | ICD-10-CM | POA: Diagnosis not present

## 2020-06-20 DIAGNOSIS — S322XXA Fracture of coccyx, initial encounter for closed fracture: Secondary | ICD-10-CM | POA: Diagnosis not present

## 2020-06-20 DIAGNOSIS — R918 Other nonspecific abnormal finding of lung field: Secondary | ICD-10-CM | POA: Diagnosis not present

## 2020-06-20 DIAGNOSIS — R0600 Dyspnea, unspecified: Secondary | ICD-10-CM | POA: Diagnosis not present

## 2020-06-20 DIAGNOSIS — Z9181 History of falling: Secondary | ICD-10-CM | POA: Diagnosis not present

## 2020-06-20 DIAGNOSIS — G8911 Acute pain due to trauma: Secondary | ICD-10-CM | POA: Diagnosis not present

## 2020-06-20 DIAGNOSIS — R52 Pain, unspecified: Secondary | ICD-10-CM | POA: Diagnosis not present

## 2020-06-20 DIAGNOSIS — I959 Hypotension, unspecified: Secondary | ICD-10-CM | POA: Diagnosis not present

## 2020-06-20 DIAGNOSIS — R0602 Shortness of breath: Secondary | ICD-10-CM | POA: Diagnosis not present

## 2020-06-23 ENCOUNTER — Telehealth: Payer: Self-pay

## 2020-06-23 NOTE — Telephone Encounter (Signed)
Pt scheduled for ED follow up 07/02/20 at 2:00 with Marjorie Smolder.

## 2020-06-25 ENCOUNTER — Encounter (HOSPITAL_COMMUNITY): Payer: Self-pay

## 2020-06-25 ENCOUNTER — Encounter: Payer: Self-pay | Admitting: Nurse Practitioner

## 2020-06-25 ENCOUNTER — Other Ambulatory Visit: Payer: Self-pay

## 2020-06-25 ENCOUNTER — Ambulatory Visit (HOSPITAL_COMMUNITY)
Admission: RE | Admit: 2020-06-25 | Discharge: 2020-06-25 | Disposition: A | Discharge status: Home / Self Care | Payer: Medicare Other | Source: Ambulatory Visit | Attending: Nurse Practitioner | Admitting: Nurse Practitioner

## 2020-06-25 DIAGNOSIS — D440 Neoplasm of uncertain behavior of thyroid gland: Secondary | ICD-10-CM | POA: Insufficient documentation

## 2020-06-25 DIAGNOSIS — R896 Abnormal cytological findings in specimens from other organs, systems and tissues: Secondary | ICD-10-CM | POA: Diagnosis not present

## 2020-06-25 DIAGNOSIS — E041 Nontoxic single thyroid nodule: Secondary | ICD-10-CM | POA: Diagnosis not present

## 2020-06-25 MED ORDER — LIDOCAINE HCL (PF) 2 % IJ SOLN
INTRAMUSCULAR | Status: AC
  Administered 2020-06-25: 10 mL
  Filled 2020-06-25: qty 10

## 2020-06-25 NOTE — Sedation Documentation (Signed)
PT tolerated thyroid biopsy procedure well today. Labs obtained and sent for pathology. PT ambulatory at discharge with no acute distress noted, given ice pack and verbalized understanding of discharge instructions.

## 2020-06-25 NOTE — Procedures (Signed)
PreOperative Dx: RIGHT thyroid nodule Postoperative Dx: RIGHT thyroid nodule Procedure:   US guided FNA of RIGHT thyroid nodule Radiologist:  Thornton Papas Anesthesia:  7 ml of 2% lidocaine Specimen:  FNA x 5  EBL:   < 1 ml Complications: None

## 2020-06-25 NOTE — Discharge Instructions (Signed)
Thyroid Needle Biopsy, Care After This sheet gives you information about how to care for yourself after your procedure. Your health care provider may also give you more specific instructions. If you have problems or questions, contact your health care provider. What can I expect after the procedure? After the procedure, it is common to have:  Soreness and tenderness that lasts for a few days.  Bruising where the needle was inserted (puncture site). Follow these instructions at home:   Take over-the-counter and prescription medicines only as told by your health care provider.  To help ease discomfort, keep your head raised (elevated) when you are lying down. When you move from lying down to sitting up, use both hands to support the back of your head and neck.  Check your puncture site every day for signs of infection. Check for: ? Redness, swelling, or pain. ? Fluid or blood. ? Warmth. ? Pus or a bad smell.  Return to your normal activities as told by your health care provider. Ask your health care provider what activities are safe for you.  Keep all follow-up visits as told by your health care provider. This is important. Contact a health care provider if:  You have redness, swelling, or pain around your puncture site.  You have fluid or blood coming from your puncture site.  Your puncture site feels warm to the touch.  You have pus or a bad smell coming from your puncture site.  You have a fever. Get help right away if:  You have severe bleeding from the puncture site.  You have difficulty swallowing.  You have swollen glands (lymph nodes) in your neck. Summary  It is common to have some bruising and soreness where the needle was inserted in your lower front neck area (puncture site).  Check your puncture site every day for signs of infection, such as redness, swelling, or pain.  Get help right away if you have severe bleeding from your puncture site. This  information is not intended to replace advice given to you by your health care provider. Make sure you discuss any questions you have with your health care provider. Document Revised: 07/01/2017 Document Reviewed: 05/02/2017 Elsevier Patient Education  2020 [employer]  

## 2020-06-27 LAB — CYTOLOGY - NON PAP

## 2020-07-02 ENCOUNTER — Ambulatory Visit: Payer: Medicare Other | Admitting: Family Medicine

## 2020-07-03 ENCOUNTER — Telehealth: Payer: Self-pay | Admitting: Nurse Practitioner

## 2020-07-03 NOTE — Telephone Encounter (Signed)
Calling about results of biopsy of thyroid completed at [facility]. Can see results on my chart but wants to discuss next steps

## 2020-07-03 NOTE — Telephone Encounter (Signed)
Will need referral to endocrinology- where is closest and easiest for the family to get to?

## 2020-07-03 NOTE — Telephone Encounter (Signed)
Renee Davidson states that she would like to go to Dr. Dorris Fetch

## 2020-07-07 ENCOUNTER — Other Ambulatory Visit: Payer: Self-pay | Admitting: Nurse Practitioner

## 2020-07-07 DIAGNOSIS — E041 Nontoxic single thyroid nodule: Secondary | ICD-10-CM

## 2020-07-07 NOTE — Progress Notes (Signed)
Ref endo

## 2020-07-09 DIAGNOSIS — M47812 Spondylosis without myelopathy or radiculopathy, cervical region: Secondary | ICD-10-CM | POA: Diagnosis not present

## 2020-07-09 DIAGNOSIS — R200 Anesthesia of skin: Secondary | ICD-10-CM | POA: Diagnosis not present

## 2020-07-09 DIAGNOSIS — R296 Repeated falls: Secondary | ICD-10-CM | POA: Diagnosis not present

## 2020-07-09 DIAGNOSIS — R350 Frequency of micturition: Secondary | ICD-10-CM | POA: Diagnosis not present

## 2020-07-10 ENCOUNTER — Encounter (HOSPITAL_COMMUNITY): Payer: Self-pay

## 2020-07-10 ENCOUNTER — Emergency Department (HOSPITAL_COMMUNITY)
Admission: EM | Admit: 2020-07-10 | Discharge: 2020-07-11 | Disposition: A | Discharge status: Home / Self Care | Payer: Medicare Other | Attending: Emergency Medicine | Admitting: Emergency Medicine

## 2020-07-10 ENCOUNTER — Other Ambulatory Visit: Payer: Self-pay

## 2020-07-10 DIAGNOSIS — E039 Hypothyroidism, unspecified: Secondary | ICD-10-CM | POA: Diagnosis not present

## 2020-07-10 DIAGNOSIS — T50901A Poisoning by unspecified drugs, medicaments and biological substances, accidental (unintentional), initial encounter: Secondary | ICD-10-CM | POA: Diagnosis not present

## 2020-07-10 DIAGNOSIS — R609 Edema, unspecified: Secondary | ICD-10-CM | POA: Insufficient documentation

## 2020-07-10 DIAGNOSIS — R531 Weakness: Secondary | ICD-10-CM | POA: Diagnosis not present

## 2020-07-10 DIAGNOSIS — Z79899 Other long term (current) drug therapy: Secondary | ICD-10-CM | POA: Insufficient documentation

## 2020-07-10 DIAGNOSIS — T424X1A Poisoning by benzodiazepines, accidental (unintentional), initial encounter: Secondary | ICD-10-CM | POA: Diagnosis not present

## 2020-07-10 LAB — COMPREHENSIVE METABOLIC PANEL
ALT: 16 U/L (ref 0–44)
AST: 20 U/L (ref 15–41)
Albumin: 4 g/dL (ref 3.5–5.0)
Alkaline Phosphatase: 109 U/L (ref 38–126)
Anion gap: 11 (ref 5–15)
BUN: 14 mg/dL (ref 8–23)
CO2: 23 mmol/L (ref 22–32)
Calcium: 9.1 mg/dL (ref 8.9–10.3)
Chloride: 105 mmol/L (ref 98–111)
Creatinine, Ser: 0.9 mg/dL (ref 0.44–1.00)
GFR, Estimated: >60 mL/min (ref >60)
Glucose, Bld: 92 mg/dL (ref 70–99)
Potassium: 4.3 mmol/L (ref 3.5–5.1)
Sodium: 139 mmol/L (ref 135–145)
Total Bilirubin: 0.6 mg/dL (ref 0.3–1.2)
Total Protein: 7.6 g/dL (ref 6.5–8.1)

## 2020-07-10 LAB — URINALYSIS, ROUTINE W REFLEX MICROSCOPIC
Bacteria, UA: NONE SEEN
Bilirubin Urine: NEGATIVE
Glucose, UA: NEGATIVE mg/dL
Hgb urine dipstick: NEGATIVE
Ketones, ur: NEGATIVE mg/dL
Nitrite: NEGATIVE
Protein, ur: NEGATIVE mg/dL
Specific Gravity, Urine: 1.008 (ref 1.005–1.030)
pH: 6 (ref 5.0–8.0)

## 2020-07-10 LAB — RAPID URINE DRUG SCREEN, HOSP PERFORMED
Amphetamines: NOT DETECTED
Barbiturates: NOT DETECTED
Benzodiazepines: POSITIVE — AB
Cocaine: NOT DETECTED
Opiates: NOT DETECTED
Tetrahydrocannabinol: NOT DETECTED

## 2020-07-10 LAB — CBC WITH DIFFERENTIAL/PLATELET
Abs Immature Granulocytes: 0.04 10*3/uL (ref 0.00–0.07)
Basophils Absolute: 0 10*3/uL (ref 0.0–0.1)
Basophils Relative: 1 %
Eosinophils Absolute: 0.1 10*3/uL (ref 0.0–0.5)
Eosinophils Relative: 1 %
HCT: 42.2 % (ref 36.0–46.0)
Hemoglobin: 13.7 g/dL (ref 12.0–15.0)
Immature Granulocytes: 1 %
Lymphocytes Relative: 16 %
Lymphs Abs: 1.2 10*3/uL (ref 0.7–4.0)
MCH: 30.7 pg (ref 26.0–34.0)
MCHC: 32.5 g/dL (ref 30.0–36.0)
MCV: 94.6 fL (ref 80.0–100.0)
Monocytes Absolute: 0.9 10*3/uL (ref 0.1–1.0)
Monocytes Relative: 11 %
Neutro Abs: 5.4 10*3/uL (ref 1.7–7.7)
Neutrophils Relative %: 70 %
Platelets: 246 10*3/uL (ref 150–400)
RBC: 4.46 MIL/uL (ref 3.87–5.11)
RDW: 13.1 % (ref 11.5–15.5)
WBC: 7.5 10*3/uL (ref 4.0–10.5)
nRBC: 0 % (ref 0.0–0.2)

## 2020-07-10 LAB — SALICYLATE LEVEL: Salicylate Lvl: <7 mg/dL — ABNORMAL LOW (ref 7.0–30.0)

## 2020-07-10 LAB — ETHANOL: Alcohol, Ethyl (B): <10 mg/dL (ref <10)

## 2020-07-10 LAB — ACETAMINOPHEN LEVEL: Acetaminophen (Tylenol), Serum: <10 ug/mL — ABNORMAL LOW (ref 10–30)

## 2020-07-10 LAB — LACTIC ACID, PLASMA: Lactic Acid, Venous: 1 mmol/L (ref 0.5–1.9)

## 2020-07-10 LAB — LIPASE, BLOOD: Lipase: 28 U/L (ref 11–51)

## 2020-07-10 MED ORDER — SODIUM CHLORIDE 0.9 % IV BOLUS
1000.0000 mL | Freq: Once | INTRAVENOUS | Status: AC
  Administered 2020-07-10: 1000 mL via INTRAVENOUS

## 2020-07-10 NOTE — ED Notes (Signed)
ED Provider at bedside to  Go over d/c instructions.

## 2020-07-10 NOTE — ED Triage Notes (Signed)
Pt to the ER with the c/o a possible drug overdose per family. Pt is said to have taken an unknown amount of Lorazepam and Risperidone.Pt is poor historian an unable to communicate effectively, no family at bedside at this time.

## 2020-07-10 NOTE — ED Notes (Signed)
Pt sister has joined pt at bedside for support. Pt's sister has asked that pt's medications be re-evaluated d/t pt increased falls and memory issues. Sister specifically wants to address Risperidone. Dr. Vanita Panda has been made aware of request.

## 2020-07-10 NOTE — Discharge Instructions (Addendum)
As discussed, your evaluation today has been largely reassuring.  But, it is important that you monitor your condition carefully, and do not hesitate to return to the ED if you develop new, or concerning changes in your condition.  Otherwise, please follow-up with your physician and our behavioral health colleagues for appropriate ongoing care.

## 2020-07-10 NOTE — ED Provider Notes (Signed)
[facility] DEPT Provider Note   CSN: [id number] Arrival date & time: 07/10/20  1840     History Chief Complaint  Patient presents with  . Drug Overdose    Pt to the ER with the c/o a possible drug overdose per family. Pt is said to have taken an unknown amount of Lorazepam and Risperidone.Pt is poor historian an unable to communicate effectively, no family at bedside at this time.   . Altered Mental Status    Renee Davidson is a 68 y.o. female.  HPI Patient presents via EMS with concern of overdose. Patient is eventually joined by her sister who assists with history. The patient lives in independent living, but has known developmental delay and psychiatric conditions. Patient takes lorazepam and Risperdal several times daily. Seemingly to the patient took 3 rather than 1 lorazepam and 2 rather than 1 Risperdal.  No report of fall, trauma, nausea, vomiting, patient denies any pain or lightheadedness. Sister notes that the patient has had increasing difficulty with performing ADLs over the past weeks and months, has had falls as well, though none today.     Past Medical History:  Diagnosis Date  . Anxiety   . Depression   . GERD (gastroesophageal reflux disease)   . Hyperlipidemia   . Mental developmental delay   . OCD (obsessive compulsive disorder)   . Osteoporosis   . Thyroid disease     Patient Active Problem List   Diagnosis Date Noted  . Ileum ulcer 11/06/2019  . Diverticulosis 11/06/2019  . Acute upper GI bleed 11/01/2019  . Hiatal hernia 11/01/2019  . Cameron lesion, acute 11/01/2019  . Heme + stool   . Acute GI bleeding 10/31/2019  . Obesity (BMI 30-39.9) 10/31/2019  . Acute metabolic encephalopathy 12/75/1700  . Iron deficiency anemia   . Family history of colon cancer   . Family history of colonic polyps   . Symptomatic anemia   . Mental developmental delay   . Advance directive discussed with patient   . Primary insomnia  06/06/2017  . Hypothyroidism 01/17/2014  . Hyperlipidemia with target LDL less than 100 01/17/2014  . GAD (generalized anxiety disorder) 01/17/2014  . GERD (gastroesophageal reflux disease) 01/17/2014  . OCD (obsessive compulsive disorder) 01/17/2014  . Osteoporosis, post-menopausal 12/05/2013    Past Surgical History:  Procedure Laterality Date  . BIOPSY  11/02/2019   Procedure: BIOPSY;  Surgeon: Gatha Mayer, MD;  Location: [facility];  Service: Endoscopy;;  . COLONOSCOPY WITH PROPOFOL N/A 11/02/2019   Procedure: COLONOSCOPY WITH PROPOFOL;  Surgeon: Gatha Mayer, MD;  Location: [facility];  Service: Endoscopy;  Laterality: N/A;  . ESOPHAGOGASTRODUODENOSCOPY (EGD) WITH PROPOFOL N/A 11/01/2019   Procedure: ESOPHAGOGASTRODUODENOSCOPY (EGD) WITH PROPOFOL;  Surgeon: Gatha Mayer, MD;  Location: [facility];  Service: Endoscopy;  Laterality: N/A;  . TONSILLECTOMY       OB History   No obstetric history on file.     Family History  Problem Relation Age of Onset  . Diabetes Father   . Hyperlipidemia Father   . Hypertension Father   . Atrial fibrillation Father   . Hyperlipidemia Sister   . Dysphagia Sister   . GI problems Mother   . Heart disease Maternal Grandfather   . Colon cancer Maternal Grandfather     Social History   Tobacco Use  . Smoking status: Never Smoker  . Smokeless tobacco: Never Used  Vaping Use  . Vaping Use: Never used  Substance Use  Topics  . Alcohol use: No  . Drug use: No    Home Medications Prior to Admission medications   Medication Sig Start Date End Date Taking? Authorizing Provider  ascorbic acid (VITAMIN C) 500 MG tablet Take 1 tablet (500 mg total) by mouth daily. 12/03/19   Hassell Done, Sherin-Margaret, FNP  benzonatate (TESSALON PERLES) 100 MG capsule Take 1 capsule (100 mg total) by mouth 3 (three) times daily as needed for cough. 07/07/18   Baruch Gouty, FNP  Calcium-Vitamin D-Vitamin K 500-100-40 MG-UNT-MCG CHEW Chew 1 tablet by mouth  2 (two) times daily.     [provider]  escitalopram (LEXAPRO) 20 MG tablet Take 1 tablet (20 mg total) by mouth daily. 02/22/20   Hassell Done Tashea-Margaret, FNP  ferrous sulfate 325 (65 FE) MG tablet Take 1 tablet (325 mg total) by mouth daily with breakfast. 02/22/20   Hassell Done, Evolet-Margaret, FNP  levothyroxine (SYNTHROID) 50 MCG tablet Take 1 tablet (50 mcg total) by mouth daily. 02/22/20   Hassell Done, Jaia-Margaret, FNP  LORazepam (ATIVAN) 0.5 MG tablet Take 1 tablet (0.5 mg total) by mouth 3 (three) times daily. 02/22/20   Hassell Done, Brittish-Margaret, FNP  lovastatin (MEVACOR) 40 MG tablet Take 1 tablet (40 mg total) by mouth at bedtime. 12/04/19   Hassell Done, Marcel-Margaret, FNP  nystatin cream (MYCOSTATIN) Apply to affected area 2 times daily 11/20/19   Hassell Done, Jaidence-Margaret, FNP  ondansetron (ZOFRAN) 4 MG tablet Take 1 tablet (4 mg total) by mouth every 6 (six) hours as needed for nausea. 11/02/19   Allie Bossier, MD  pantoprazole (PROTONIX) 40 MG tablet TAKE 1 TABLET DAILY 04/21/20   Chevis Pretty, FNP  risperiDONE (RISPERDAL) 0.25 MG tablet TAKE 1 TABLET IN THE MORNING AND 2 TABLETS AT BEDTIME 02/22/20   Chevis Pretty, FNP    Allergies    Doxycycline  Review of Systems   Review of Systems  Constitutional:       Per HPI, otherwise negative  HENT:       Per HPI, otherwise negative  Respiratory:       Per HPI, otherwise negative  Cardiovascular:       Per HPI, otherwise negative  Gastrointestinal: Negative for vomiting.  Endocrine:       Negative aside from HPI  Genitourinary:       Neg aside from HPI   Musculoskeletal:       Per HPI, otherwise negative  Skin: Negative.   Neurological: Positive for weakness. Negative for syncope.  Psychiatric/Behavioral:       Per HPI otherwise negative    Physical Exam Updated Vital Signs BP (!) 115/59 (BP Location: Right Arm)   Pulse 79   Temp 98.8 F (37.1 C) (Oral)   Resp 20   SpO2 99%   Physical Exam Vitals and nursing note  reviewed.  Constitutional:      General: She is not in acute distress.    Appearance: She is well-developed and well-nourished. She is obese. She is not ill-appearing or diaphoretic.  HENT:     Head: Normocephalic and atraumatic.  Eyes:     Extraocular Movements: EOM normal.     Conjunctiva/sclera: Conjunctivae normal.  Cardiovascular:     Rate and Rhythm: Normal rate and regular rhythm.  Pulmonary:     Effort: Pulmonary effort is normal. No respiratory distress.     Breath sounds: Normal breath sounds. No stridor.  Abdominal:     General: There is no distension.     Tenderness: There is no abdominal  tenderness. There is no guarding.  Musculoskeletal:        General: No deformity.     Right lower leg: Edema present.     Left lower leg: Edema present.  Skin:    General: Skin is warm and dry.  Neurological:     Mental Status: She is alert.     Cranial Nerves: No cranial nerve deficit.     Motor: Atrophy present. No tremor.  Psychiatric:        Mood and Affect: Mood and affect normal.        Behavior: Behavior is slowed.        Cognition and Memory: Cognition is impaired. Memory is impaired.     ED Results / Procedures / Treatments   Labs (all labs ordered are listed, but only abnormal results are displayed) Labs Reviewed  SALICYLATE LEVEL - Abnormal; Notable for the following components:      Result Value   Salicylate Lvl <6.3 (*)    All other components within normal limits  ACETAMINOPHEN LEVEL - Abnormal; Notable for the following components:   Acetaminophen (Tylenol), Serum <10 (*)    All other components within normal limits  URINALYSIS, ROUTINE W REFLEX MICROSCOPIC - Abnormal; Notable for the following components:   Leukocytes,Ua TRACE (*)    All other components within normal limits  RAPID URINE DRUG SCREEN, HOSP PERFORMED - Abnormal; Notable for the following components:   Benzodiazepines POSITIVE (*)    All other components within normal limits  ETHANOL   COMPREHENSIVE METABOLIC PANEL  LIPASE, BLOOD  CBC WITH DIFFERENTIAL/PLATELET  LACTIC ACID, PLASMA    EKG EKG Interpretation  Date/Time:  Thursday July 10 2020 19:29:33 EST Ventricular Rate:  78 PR Interval:    QRS Duration: 88 QT Interval:  405 QTC Calculation: 462 R Axis:   22 Text Interpretation: Sinus rhythm Low voltage, precordial leads Abnormal R-wave progression, early transition Artifact Abnormal ECG Confirmed by Carmin Muskrat [phone]) on 07/10/2020 8:08:20 PM   Procedures Procedures (including critical care time)  Medications Ordered in ED Medications  sodium chloride 0.9 % bolus 1,000 mL (0 mLs Intravenous Stopped 07/10/20 2312)    ED Course  I have reviewed the triage vital signs and the nursing notes.  Pertinent labs & imaging results that were available during my care of the patient were reviewed by me and considered in my medical decision making (see chart for details).  update:, Patient in no distress.  11:22 PM Patient has ambulated with a walker, is in no distress.  Now, with her sister, nursing staff we discussed all findings again, no evidence for consequences of earlier unintentional overdose, no evidence for new infection, electrolyte abnormalities or other substantial changes.  We had a lengthy conversation about the patient's need for follow-up with outpatient mental health providers for consideration of medication adjustments as needed and to consider living in a situation with assisted living, switching from independent where she has now. Patient is in favor of this latter suggestion, which has seemed to be a point of contention until recently. With reassuring physiologic exam here, absence of hemodynamic stability, no evidence for infection, no evidence for consequences of her overdose, the patient is discharged in stable condition, in the company of her sister with anticipated close outpatient follow-up.   MDM Rules/Calculators/A&P MDM Number  of Diagnoses or Management Options Acute drug overdose, accidental or unintentional, initial encounter: new, needed workup   Amount and/or Complexity of Data Reviewed Clinical lab tests: reviewed Tests in  the medicine section of CPT: reviewed Decide to obtain previous medical records or to obtain history from someone other than the patient: yes Obtain history from someone other than the patient: yes Review and summarize past medical records: yes  Risk of Complications, Morbidity, and/or Mortality Presenting problems: high Diagnostic procedures: high Management options: high  Critical Care Total time providing critical care: < 30 minutes  Patient Progress Patient progress: stable  Final Clinical Impression(s) / ED Diagnoses Final diagnoses:  Acute drug overdose, accidental or unintentional, initial encounter     Carmin Muskrat, MD 07/10/20 2326

## 2020-07-10 NOTE — ED Notes (Signed)
ED Provider at bedside to assess.

## 2020-07-10 NOTE — ED Notes (Signed)
Initial contact with pt, pt to the ER with the c/o a possible drug overdose per family. Pt is said to have taken an unknown amount of Lorazepam and Risperidone.Pt is poor historian an unable to communicate effectively, no family at bedside at this time.

## 2020-07-10 NOTE — ED Notes (Signed)
Pt ambulatory via walker. Pt walked approx. 20 feet with min. Assistance. MD made aware.

## 2020-07-11 ENCOUNTER — Encounter: Payer: Self-pay | Admitting: Family Medicine

## 2020-07-11 ENCOUNTER — Ambulatory Visit (INDEPENDENT_AMBULATORY_CARE_PROVIDER_SITE_OTHER): Payer: Medicare Other | Admitting: Family Medicine

## 2020-07-11 DIAGNOSIS — B372 Candidiasis of skin and nail: Secondary | ICD-10-CM

## 2020-07-11 DIAGNOSIS — T50901D Poisoning by unspecified drugs, medicaments and biological substances, accidental (unintentional), subsequent encounter: Secondary | ICD-10-CM | POA: Diagnosis not present

## 2020-07-11 MED ORDER — FLUCONAZOLE 150 MG PO TABS: 150.0000 mg | ORAL_TABLET | ORAL | 0 refills | Status: DC

## 2020-07-11 MED ORDER — NYSTATIN 100000 UNIT/GM EX POWD: 1.0000 "application " | Freq: Three times a day (TID) | CUTANEOUS | 3 refills | Status: DC

## 2020-07-11 NOTE — Progress Notes (Addendum)
Virtual Visit via telephone Note  I connected with Renee Davidson on 07/11/20 at 1330 by telephone and verified that I am speaking with the correct person using two identifiers. Renee Davidson is currently located at home and sisters are currently with her during visit. The provider, Fransisca Kaufmann Naija Troost, MD is located in their office at time of visit.  Call ended at 1341  I discussed the limitations, risks, security and privacy concerns of performing an evaluation and management service by telephone and the availability of in person appointments. I also discussed with the patient that there may be a patient responsible charge related to this service. The patient expressed understanding and agreed to proceed.   History and Present Illness: Patient is calling in for a rash under her breasts. The rash has a yeast appearance and she fights this regularly.  She is using a yeast cream and it is very painful.  The right breast is worse than the rest. She says it is very red and raw.   Patient has had accidental overdose with her medications including Risperdal and benzodiazepine, will refer to Dr. Casimiro Needle  No diagnosis found.  Outpatient Encounter Medications as of 07/11/2020  Medication Sig  . ascorbic acid (VITAMIN C) 500 MG tablet Take 1 tablet (500 mg total) by mouth daily.  . benzonatate (TESSALON PERLES) 100 MG capsule Take 1 capsule (100 mg total) by mouth 3 (three) times daily as needed for cough.  . Calcium-Vitamin D-Vitamin K 500-100-40 MG-UNT-MCG CHEW Chew 1 tablet by mouth 2 (two) times daily.   Marland Kitchen escitalopram (LEXAPRO) 20 MG tablet Take 1 tablet (20 mg total) by mouth daily.  . ferrous sulfate 325 (65 FE) MG tablet Take 1 tablet (325 mg total) by mouth daily with breakfast.  . levothyroxine (SYNTHROID) 50 MCG tablet Take 1 tablet (50 mcg total) by mouth daily.  Marland Kitchen LORazepam (ATIVAN) 0.5 MG tablet Take 1 tablet (0.5 mg total) by mouth 3 (three) times daily.  Marland Kitchen lovastatin (MEVACOR) 40 MG  tablet Take 1 tablet (40 mg total) by mouth at bedtime.  Marland Kitchen nystatin cream (MYCOSTATIN) Apply to affected area 2 times daily  . ondansetron (ZOFRAN) 4 MG tablet Take 1 tablet (4 mg total) by mouth every 6 (six) hours as needed for nausea.  . pantoprazole (PROTONIX) 40 MG tablet TAKE 1 TABLET DAILY  . risperiDONE (RISPERDAL) 0.25 MG tablet TAKE 1 TABLET IN THE MORNING AND 2 TABLETS AT BEDTIME   No facility-administered encounter medications on file as of 07/11/2020.    Review of Systems  Constitutional: Negative for chills and fever.  Eyes: Negative for visual disturbance.  Respiratory: Negative for chest tightness and shortness of breath.   Cardiovascular: Negative for chest pain and leg swelling.  Musculoskeletal: Negative for back pain and gait problem.  Skin: Positive for color change, rash and wound.  Neurological: Negative for light-headedness and headaches.  Psychiatric/Behavioral: Negative for agitation and behavioral problems.  All other systems reviewed and are negative.   Observations/Objective: Patient sounds comfortable and in no acute distress  Assessment and Plan: Problem List Items Addressed This Visit   None   Visit Diagnoses    Yeast dermatitis    -  Primary   Relevant Medications   fluconazole (DIFLUCAN) 150 MG tablet   nystatin (MYCOSTATIN/NYSTOP) powder   Accidental overdose, subsequent encounter       Relevant Orders   Ambulatory referral to Psychiatry       Follow up plan: Return if symptoms  worsen or fail to improve.     I discussed the assessment and treatment plan with the patient. The patient was provided an opportunity to ask questions and all were answered. The patient agreed with the plan and demonstrated an understanding of the instructions.   The patient was advised to call back or seek an in-person evaluation if the symptoms worsen or if the condition fails to improve as anticipated.  The above assessment and management plan was discussed  with the patient. The patient verbalized understanding of and has agreed to the management plan. Patient is aware to call the clinic if symptoms persist or worsen. Patient is aware when to return to the clinic for a follow-up visit. Patient educated on when it is appropriate to go to the emergency department.    I provided 11 minutes of non-face-to-face time during this encounter.    Worthy Rancher, MD

## 2020-07-11 NOTE — Addendum Note (Signed)
Addended by: Caryl Pina on: 07/11/2020 01:53 PM   Modules accepted: Orders

## 2020-07-14 ENCOUNTER — Ambulatory Visit: Payer: Medicare Other | Admitting: Family Medicine

## 2020-07-15 ENCOUNTER — Ambulatory Visit (INDEPENDENT_AMBULATORY_CARE_PROVIDER_SITE_OTHER): Payer: Medicare Other | Admitting: "Endocrinology

## 2020-07-15 ENCOUNTER — Other Ambulatory Visit: Payer: Self-pay | Admitting: Nurse Practitioner

## 2020-07-15 ENCOUNTER — Other Ambulatory Visit (HOSPITAL_COMMUNITY): Payer: Self-pay | Admitting: Neurological Surgery

## 2020-07-15 ENCOUNTER — Other Ambulatory Visit: Payer: Self-pay

## 2020-07-15 ENCOUNTER — Other Ambulatory Visit: Payer: Self-pay | Admitting: Neurological Surgery

## 2020-07-15 ENCOUNTER — Encounter: Payer: Self-pay | Admitting: "Endocrinology

## 2020-07-15 VITALS — BP 120/52 | HR 64 | Ht 65.0 in | Wt 217.0 lb

## 2020-07-15 DIAGNOSIS — E049 Nontoxic goiter, unspecified: Secondary | ICD-10-CM

## 2020-07-15 DIAGNOSIS — M47812 Spondylosis without myelopathy or radiculopathy, cervical region: Secondary | ICD-10-CM

## 2020-07-15 DIAGNOSIS — E039 Hypothyroidism, unspecified: Secondary | ICD-10-CM

## 2020-07-15 DIAGNOSIS — R296 Repeated falls: Secondary | ICD-10-CM

## 2020-07-15 NOTE — Progress Notes (Signed)
Endocrinology Consult Note                                            07/15/2020, 12:50 PM   Subjective:    Patient ID: Renee Davidson, female    DOB: [birth year], PCP Renee Pretty, FNP   Past Medical History:  Diagnosis Date  . Anxiety   . Depression   . GERD (gastroesophageal reflux disease)   . Hyperlipidemia   . Mental developmental delay   . OCD (obsessive compulsive disorder)   . Osteoporosis   . Thyroid disease    Past Surgical History:  Procedure Laterality Date  . BIOPSY  11/02/2019   Procedure: BIOPSY;  Surgeon: Gatha Mayer, MD;  Location: [facility];  Service: Endoscopy;;  . COLONOSCOPY WITH PROPOFOL N/A 11/02/2019   Procedure: COLONOSCOPY WITH PROPOFOL;  Surgeon: Gatha Mayer, MD;  Location: [facility];  Service: Endoscopy;  Laterality: N/A;  . ESOPHAGOGASTRODUODENOSCOPY (EGD) WITH PROPOFOL N/A 11/01/2019   Procedure: ESOPHAGOGASTRODUODENOSCOPY (EGD) WITH PROPOFOL;  Surgeon: Gatha Mayer, MD;  Location: [facility];  Service: Endoscopy;  Laterality: N/A;  . TONSILLECTOMY     Social History   Socioeconomic History  . Marital status: Single    Spouse name: Not on file  . Number of children: 0  . Years of education: 16  . Highest education level: 12th grade  Occupational History  . Occupation: retired    Comment: Special educational needs teacher  Tobacco Use  . Smoking status: Never Smoker  . Smokeless tobacco: Never Used  Vaping Use  . Vaping Use: Never used  Substance and Sexual Activity  . Alcohol use: No  . Drug use: No  . Sexual activity: Never  Other Topics Concern  . Not on file  Social History Narrative  . Not on file   Social Determinants of Health   Financial Resource Strain: Not on file  Food Insecurity: Not on file  Transportation Needs: Not on file  Physical Activity: Not on file  Stress: Not on file  Social Connections: Not on file   Family History  Problem Relation Age of Onset  . Diabetes Father   . Hyperlipidemia Father    . Hypertension Father   . Atrial fibrillation Father   . Heart attack Father   . Osteoporosis Father   . Hyperlipidemia Sister   . Dysphagia Sister   . GI problems Mother   . Hypertension Mother   . Hyperlipidemia Mother   . Heart disease Maternal Grandfather   . Colon cancer Maternal Grandfather    Outpatient Encounter Medications as of 07/15/2020  Medication Sig  . ascorbic acid (VITAMIN C) 500 MG tablet Take 1 tablet (500 mg total) by mouth daily.  . benzonatate (TESSALON PERLES) 100 MG capsule Take 1 capsule (100 mg total) by mouth 3 (three) times daily as needed for cough.  . Calcium-Vitamin D-Vitamin K 500-100-40 MG-UNT-MCG CHEW Chew 1 tablet by mouth 2 (two) times daily.   Marland Kitchen escitalopram (LEXAPRO) 20 MG tablet Take 1 tablet (20 mg total) by mouth daily.  . ferrous sulfate 325 (65 FE) MG tablet Take 1 tablet (325 mg total) by mouth daily with breakfast.  . fluconazole (DIFLUCAN) 150 MG tablet Take 1 tablet (150 mg total) by mouth once a week.  . levothyroxine (SYNTHROID) 50 MCG tablet Take 1 tablet (50 mcg total) by  mouth daily.  Marland Kitchen LORazepam (ATIVAN) 0.5 MG tablet Take 1 tablet (0.5 mg total) by mouth 3 (three) times daily.  Marland Kitchen lovastatin (MEVACOR) 40 MG tablet Take 1 tablet (40 mg total) by mouth at bedtime.  Marland Kitchen nystatin (MYCOSTATIN/NYSTOP) powder Apply 1 application topically 3 (three) times daily.  Marland Kitchen nystatin cream (MYCOSTATIN) Apply to affected area 2 times daily  . ondansetron (ZOFRAN) 4 MG tablet Take 1 tablet (4 mg total) by mouth every 6 (six) hours as needed for nausea. (Patient not taking: Reported on 07/15/2020)  . pantoprazole (PROTONIX) 40 MG tablet TAKE 1 TABLET DAILY  . risperiDONE (RISPERDAL) 0.25 MG tablet TAKE 1 TABLET IN THE MORNING AND 2 TABLETS AT BEDTIME   No facility-administered encounter medications on file as of 07/15/2020.   ALLERGIES: Allergies  Allergen Reactions  . Doxycycline Rash    VACCINATION STATUS: Immunization History  Administered  Date(s) Administered  . Influenza, High Dose Seasonal PF 05/14/2016, 05/06/2017, 05/09/2018  . Influenza,inj,Quad PF,6+ Mos 05/18/2013, 05/07/2014, 05/06/2015  . Pneumococcal Conjugate-13 01/17/2014  . Pneumococcal Polysaccharide-23 07/01/2015  . Tdap 05/31/2011, 05/11/2020  . Zoster 03/04/2014  . Zoster Recombinat (Shingrix) 07/04/2017, 10/18/2017    HPI Renee Davidson is 69 y.o. female who presents today with a medical history as above. she is being seen in consultation for nodular goiter, hypothyroidism requested by Renee Pretty, FNP. She is accompanied by her Renee Davidson to the clinic.  History is obtained from the patient as well as chart review.  She is known to have hypothyroidism for at least 10 years, currently on Synthroid 50 mcg p.o. daily before breakfast.  Her recent thyroid function tests are consistent with appropriate replacement.  In November 2021 she was found to have incidental thyroid nodule of around the right lobe of her thyroid on CT scan of the chest.  Subsequent ultrasound confirmed 3 cm nodule on the right lobe with suspicious features.  She underwent fine-needle aspiration on November 24 which revealed atypia of undetermined significance.  A sample was sent for Afirma, results are pending.  She denies dysphagia, shortness of breath, no voice change.  She has no family history of thyroid malignancy.  She reports minimally fluctuating body weight.  She uses a walker to get around due to disequilibrium.     Review of Systems  Constitutional: no recent weight gain/loss, no fatigue, no subjective hyperthermia, no subjective hypothermia Eyes: no blurry vision, no xerophthalmia ENT: no sore throat, no nodules palpated in throat, no dysphagia/odynophagia, no hoarseness Cardiovascular: no Chest Pain, no Shortness of Breath, no palpitations, no leg swelling Respiratory: no cough, no shortness of breath Gastrointestinal: no Nausea/Vomiting/Diarhhea Musculoskeletal:  Walks with a walker due to disequilibrium, no muscle/joint aches Skin: no rashes Neurological: no tremors, no numbness, no tingling, no dizziness Psychiatric: no depression, no anxiety  Objective:    Vitals with BMI 07/15/2020 07/10/2020 07/10/2020  Height 5\' 5"  - -  Weight 217 lbs - -  BMI 24.58 - -  Systolic 099 833 825  Diastolic 52 59 59  Pulse 64 79 -    BP (!) 120/52   Pulse 64   Ht 5\' 5"  (1.651 m)   Wt 217 lb (98.4 kg)   BMI 36.11 kg/m   Wt Readings from Last 3 Encounters:  07/15/20 217 lb (98.4 kg)  06/05/20 222 lb (100.7 kg)  05/23/20 216 lb (98 kg)    Physical Exam  Constitutional:  Body mass index is 36.11 kg/m.,  not in acute distress, normal state  of mind Eyes: PERRLA, EOMI, no exophthalmos ENT: moist mucous membranes, + palpable thyroid,  gross cervical lymphadenopathy Cardiovascular: normal precordial activity, Regular Rate and Rhythm, no Murmur/Rubs/Gallops Respiratory:  adequate breathing efforts, no gross chest deformity, Clear to auscultation bilaterally Gastrointestinal: abdomen soft, Non -tender, No distension, Bowel Sounds present, no gross organomegaly Musculoskeletal: + Ambulates with a walker, no gross deformities, strength intact in all four extremities Skin: moist, warm, no rashes Neurological: no tremor with outstretched hands, Deep tendon reflexes normal in bilateral lower extremities.  CMP ( most recent) CMP     Component Value Date/Time   NA 139 07/10/2020 1942   NA 139 02/22/2020 1252   K 4.3 07/10/2020 1942   CL 105 07/10/2020 1942   CO2 23 07/10/2020 1942   GLUCOSE 92 07/10/2020 1942   BUN 14 07/10/2020 1942   BUN 11 02/22/2020 1252   CREATININE 0.90 07/10/2020 1942   CALCIUM 9.1 07/10/2020 1942   PROT 7.6 07/10/2020 1942   PROT 6.6 02/22/2020 1252   ALBUMIN 4.0 07/10/2020 1942   ALBUMIN 4.0 02/22/2020 1252   AST 20 07/10/2020 1942   ALT 16 07/10/2020 1942   ALKPHOS 109 07/10/2020 1942   BILITOT 0.6 07/10/2020 1942    BILITOT 0.3 02/22/2020 1252   GFRNONAA >60 07/10/2020 1942   GFRAA >60 04/19/2020 1219     Diabetic Labs (most recent): Lab Results  Component Value Date   HGBA1C 5.7 06/08/2017     Lipid Panel ( most recent) Lipid Panel     Component Value Date/Time   CHOL 163 02/22/2020 1252   TRIG 74 02/22/2020 1252   TRIG 49 12/18/2014 1209   HDL 53 02/22/2020 1252   HDL 55 12/18/2014 1209   CHOLHDL 3.1 02/22/2020 1252   LDLCALC 96 02/22/2020 1252   LDLCALC 89 01/17/2014 0946   LABVLDL 14 02/22/2020 1252      Lab Results  Component Value Date   TSH 0.893 02/22/2020   TSH 0.761 12/04/2019   TSH 2.407 10/31/2019   TSH 2.130 03/26/2019   TSH 1.360 07/07/2018   TSH 0.282 (L) 05/09/2018   TSH 0.428 (L) 11/04/2017   TSH 0.727 06/06/2017   TSH 1.420 12/06/2016   TSH 0.927 04/23/2016    Fine-needle aspiration of right lobe thyroid nodule on June 25, 2020 FINAL MICROSCOPIC DIAGNOSIS:  - Atypia of undetermined significance (Bethesda category III)   SPECIMEN ADEQUACY:  Satisfactory for evaluation   DIAGNOSTIC COMMENTS:  This specimen will be sent for Afirma testing.    Assessment & Plan:   1. Nodular goiter 2. Acquired hypothyroidism   - AFTIN LYE  is being seen at a kind request of Hassell Done, Camreigh-Margaret, FNP. - I have reviewed her available thyroid records and clinically evaluated the patient. - Based on these reviews, she has right lobe thyroid nodule with suspicious features, status post fine-needle aspiration which revealed atypia of undetermined significance, Afirma molecular testing in progress-results pending.  The next course of action was discussed with the patient.  If she is identified to have malignancy of the thyroid, she is willing and planning to have surgery. She will return in 5-6 weeks to discuss her results. Regarding her longstanding hypothyroidism: She is currently on Synthroid 50 mcg p.o. daily before breakfast.  Her recent thyroid function tests  are consistent with appropriate replacement.   - We discussed about the correct intake of her thyroid hormone, on empty stomach at fasting, with water, separated by at least 30 minutes from breakfast and  other medications,  and separated by more than 4 hours from calcium, iron, multivitamins, acid reflux medications (PPIs). -Patient is made aware of the fact that thyroid hormone replacement is needed for life, dose to be adjusted by periodic monitoring of thyroid function tests.  - I did not initiate any new prescriptions today. - she is advised to maintain close follow up with Renee Pretty, FNP for primary care needs.   - Time spent with the patient: 45 minutes, of which >50% was spent in  counseling her about her nodular goiter, hypothyroidism and the rest in obtaining information about her symptoms, reviewing her previous labs/studies ( including abstractions from other facilities),  evaluations, and treatments,  and developing a plan to confirm diagnosis and long term treatment based on the latest standards of care/guidelines; and documenting her care.  Garvin Fila Mendez participated in the discussions, expressed understanding, and voiced agreement with the above plans.  All questions were answered to her satisfaction. she is encouraged to contact clinic should she have any questions or concerns prior to her return visit.  Follow up plan: Return in about 6 weeks (around 08/26/2020), or to discuss her Afirma Test Results.   Glade Lloyd, MD [facility] [address] [place], [place] [address] Phone: [phone]  Fax: [phone]     07/15/2020, 12:50 PM  This note was partially dictated with voice recognition software. Similar sounding words can be transcribed inadequately or may not  be corrected upon review.

## 2020-07-21 ENCOUNTER — Other Ambulatory Visit: Payer: Self-pay | Admitting: Nurse Practitioner

## 2020-07-21 DIAGNOSIS — M47812 Spondylosis without myelopathy or radiculopathy, cervical region: Secondary | ICD-10-CM | POA: Diagnosis not present

## 2020-07-21 DIAGNOSIS — R200 Anesthesia of skin: Secondary | ICD-10-CM | POA: Diagnosis not present

## 2020-07-21 DIAGNOSIS — M542 Cervicalgia: Secondary | ICD-10-CM | POA: Diagnosis not present

## 2020-07-21 DIAGNOSIS — R350 Frequency of micturition: Secondary | ICD-10-CM | POA: Diagnosis not present

## 2020-07-21 DIAGNOSIS — K2100 Gastro-esophageal reflux disease with esophagitis, without bleeding: Secondary | ICD-10-CM

## 2020-07-21 DIAGNOSIS — R296 Repeated falls: Secondary | ICD-10-CM | POA: Diagnosis not present

## 2020-07-22 ENCOUNTER — Encounter (HOSPITAL_COMMUNITY): Payer: Self-pay

## 2020-07-24 ENCOUNTER — Ambulatory Visit (HOSPITAL_COMMUNITY): Payer: Medicare Other

## 2020-07-24 ENCOUNTER — Other Ambulatory Visit (HOSPITAL_COMMUNITY): Payer: Medicare Other

## 2020-07-29 DIAGNOSIS — M542 Cervicalgia: Secondary | ICD-10-CM | POA: Diagnosis not present

## 2020-07-29 DIAGNOSIS — R350 Frequency of micturition: Secondary | ICD-10-CM | POA: Diagnosis not present

## 2020-07-29 DIAGNOSIS — M47812 Spondylosis without myelopathy or radiculopathy, cervical region: Secondary | ICD-10-CM | POA: Diagnosis not present

## 2020-07-29 DIAGNOSIS — R296 Repeated falls: Secondary | ICD-10-CM | POA: Diagnosis not present

## 2020-07-29 DIAGNOSIS — R200 Anesthesia of skin: Secondary | ICD-10-CM | POA: Diagnosis not present

## 2020-08-08 DIAGNOSIS — R350 Frequency of micturition: Secondary | ICD-10-CM | POA: Diagnosis not present

## 2020-08-08 DIAGNOSIS — R200 Anesthesia of skin: Secondary | ICD-10-CM | POA: Diagnosis not present

## 2020-08-08 DIAGNOSIS — M47812 Spondylosis without myelopathy or radiculopathy, cervical region: Secondary | ICD-10-CM | POA: Diagnosis not present

## 2020-08-08 DIAGNOSIS — M542 Cervicalgia: Secondary | ICD-10-CM | POA: Diagnosis not present

## 2020-08-08 DIAGNOSIS — R296 Repeated falls: Secondary | ICD-10-CM | POA: Diagnosis not present

## 2020-08-11 DIAGNOSIS — M6281 Muscle weakness (generalized): Secondary | ICD-10-CM | POA: Diagnosis not present

## 2020-08-11 DIAGNOSIS — F419 Anxiety disorder, unspecified: Secondary | ICD-10-CM | POA: Diagnosis not present

## 2020-08-11 DIAGNOSIS — R296 Repeated falls: Secondary | ICD-10-CM | POA: Diagnosis present

## 2020-08-11 DIAGNOSIS — R55 Syncope and collapse: Secondary | ICD-10-CM | POA: Diagnosis not present

## 2020-08-11 DIAGNOSIS — F429 Obsessive-compulsive disorder, unspecified: Secondary | ICD-10-CM | POA: Diagnosis not present

## 2020-08-11 DIAGNOSIS — S42351A Displaced comminuted fracture of shaft of humerus, right arm, initial encounter for closed fracture: Secondary | ICD-10-CM | POA: Diagnosis not present

## 2020-08-11 DIAGNOSIS — Z9181 History of falling: Secondary | ICD-10-CM | POA: Diagnosis not present

## 2020-08-11 DIAGNOSIS — Z20822 Contact with and (suspected) exposure to covid-19: Secondary | ICD-10-CM | POA: Diagnosis not present

## 2020-08-11 DIAGNOSIS — E785 Hyperlipidemia, unspecified: Secondary | ICD-10-CM | POA: Diagnosis not present

## 2020-08-11 DIAGNOSIS — W19XXXA Unspecified fall, initial encounter: Secondary | ICD-10-CM | POA: Diagnosis not present

## 2020-08-11 DIAGNOSIS — I517 Cardiomegaly: Secondary | ICD-10-CM | POA: Diagnosis not present

## 2020-08-11 DIAGNOSIS — Z8781 Personal history of (healed) traumatic fracture: Secondary | ICD-10-CM | POA: Diagnosis not present

## 2020-08-11 DIAGNOSIS — R41841 Cognitive communication deficit: Secondary | ICD-10-CM | POA: Diagnosis not present

## 2020-08-11 DIAGNOSIS — S42354D Nondisplaced comminuted fracture of shaft of humerus, right arm, subsequent encounter for fracture with routine healing: Secondary | ICD-10-CM | POA: Diagnosis not present

## 2020-08-11 DIAGNOSIS — E7800 Pure hypercholesterolemia, unspecified: Secondary | ICD-10-CM | POA: Diagnosis not present

## 2020-08-11 DIAGNOSIS — G8911 Acute pain due to trauma: Secondary | ICD-10-CM | POA: Diagnosis not present

## 2020-08-11 DIAGNOSIS — K219 Gastro-esophageal reflux disease without esophagitis: Secondary | ICD-10-CM | POA: Diagnosis not present

## 2020-08-11 DIAGNOSIS — I10 Essential (primary) hypertension: Secondary | ICD-10-CM | POA: Diagnosis not present

## 2020-08-11 DIAGNOSIS — R5381 Other malaise: Secondary | ICD-10-CM | POA: Diagnosis not present

## 2020-08-11 DIAGNOSIS — R001 Bradycardia, unspecified: Secondary | ICD-10-CM | POA: Diagnosis not present

## 2020-08-11 DIAGNOSIS — Z9889 Other specified postprocedural states: Secondary | ICD-10-CM | POA: Diagnosis not present

## 2020-08-11 DIAGNOSIS — E041 Nontoxic single thyroid nodule: Secondary | ICD-10-CM | POA: Diagnosis not present

## 2020-08-11 DIAGNOSIS — R52 Pain, unspecified: Secondary | ICD-10-CM | POA: Diagnosis not present

## 2020-08-11 DIAGNOSIS — I6612 Occlusion and stenosis of left anterior cerebral artery: Secondary | ICD-10-CM | POA: Diagnosis not present

## 2020-08-11 DIAGNOSIS — M810 Age-related osteoporosis without current pathological fracture: Secondary | ICD-10-CM | POA: Diagnosis present

## 2020-08-11 DIAGNOSIS — E039 Hypothyroidism, unspecified: Secondary | ICD-10-CM | POA: Diagnosis not present

## 2020-08-11 DIAGNOSIS — S42354A Nondisplaced comminuted fracture of shaft of humerus, right arm, initial encounter for closed fracture: Secondary | ICD-10-CM | POA: Diagnosis not present

## 2020-08-11 DIAGNOSIS — R262 Difficulty in walking, not elsewhere classified: Secondary | ICD-10-CM | POA: Diagnosis not present

## 2020-08-11 DIAGNOSIS — S42301A Unspecified fracture of shaft of humerus, right arm, initial encounter for closed fracture: Secondary | ICD-10-CM | POA: Diagnosis not present

## 2020-08-11 DIAGNOSIS — S42291A Other displaced fracture of upper end of right humerus, initial encounter for closed fracture: Secondary | ICD-10-CM | POA: Diagnosis not present

## 2020-08-11 DIAGNOSIS — I6521 Occlusion and stenosis of right carotid artery: Secondary | ICD-10-CM | POA: Diagnosis not present

## 2020-08-12 DIAGNOSIS — K219 Gastro-esophageal reflux disease without esophagitis: Secondary | ICD-10-CM | POA: Diagnosis present

## 2020-08-12 DIAGNOSIS — R296 Repeated falls: Secondary | ICD-10-CM | POA: Diagnosis present

## 2020-08-12 DIAGNOSIS — R41841 Cognitive communication deficit: Secondary | ICD-10-CM | POA: Diagnosis not present

## 2020-08-12 DIAGNOSIS — G8929 Other chronic pain: Secondary | ICD-10-CM | POA: Diagnosis not present

## 2020-08-12 DIAGNOSIS — R262 Difficulty in walking, not elsewhere classified: Secondary | ICD-10-CM | POA: Diagnosis not present

## 2020-08-12 DIAGNOSIS — E7800 Pure hypercholesterolemia, unspecified: Secondary | ICD-10-CM | POA: Diagnosis present

## 2020-08-12 DIAGNOSIS — Z8781 Personal history of (healed) traumatic fracture: Secondary | ICD-10-CM | POA: Diagnosis not present

## 2020-08-12 DIAGNOSIS — F321 Major depressive disorder, single episode, moderate: Secondary | ICD-10-CM | POA: Diagnosis not present

## 2020-08-12 DIAGNOSIS — S42301A Unspecified fracture of shaft of humerus, right arm, initial encounter for closed fracture: Secondary | ICD-10-CM | POA: Diagnosis not present

## 2020-08-12 DIAGNOSIS — S42354D Nondisplaced comminuted fracture of shaft of humerus, right arm, subsequent encounter for fracture with routine healing: Secondary | ICD-10-CM | POA: Diagnosis not present

## 2020-08-12 DIAGNOSIS — R55 Syncope and collapse: Secondary | ICD-10-CM | POA: Diagnosis not present

## 2020-08-12 DIAGNOSIS — E785 Hyperlipidemia, unspecified: Secondary | ICD-10-CM | POA: Diagnosis not present

## 2020-08-12 DIAGNOSIS — Z9181 History of falling: Secondary | ICD-10-CM | POA: Diagnosis not present

## 2020-08-12 DIAGNOSIS — W19XXXA Unspecified fall, initial encounter: Secondary | ICD-10-CM | POA: Diagnosis not present

## 2020-08-12 DIAGNOSIS — E039 Hypothyroidism, unspecified: Secondary | ICD-10-CM | POA: Diagnosis present

## 2020-08-12 DIAGNOSIS — G20 Parkinson's disease: Secondary | ICD-10-CM | POA: Diagnosis not present

## 2020-08-12 DIAGNOSIS — S42354A Nondisplaced comminuted fracture of shaft of humerus, right arm, initial encounter for closed fracture: Secondary | ICD-10-CM | POA: Diagnosis not present

## 2020-08-12 DIAGNOSIS — I6521 Occlusion and stenosis of right carotid artery: Secondary | ICD-10-CM | POA: Diagnosis not present

## 2020-08-12 DIAGNOSIS — I6612 Occlusion and stenosis of left anterior cerebral artery: Secondary | ICD-10-CM | POA: Diagnosis not present

## 2020-08-12 DIAGNOSIS — S42291A Other displaced fracture of upper end of right humerus, initial encounter for closed fracture: Secondary | ICD-10-CM | POA: Diagnosis not present

## 2020-08-12 DIAGNOSIS — I517 Cardiomegaly: Secondary | ICD-10-CM | POA: Diagnosis not present

## 2020-08-12 DIAGNOSIS — S42351A Displaced comminuted fracture of shaft of humerus, right arm, initial encounter for closed fracture: Secondary | ICD-10-CM | POA: Diagnosis present

## 2020-08-12 DIAGNOSIS — E041 Nontoxic single thyroid nodule: Secondary | ICD-10-CM | POA: Diagnosis not present

## 2020-08-12 DIAGNOSIS — M6281 Muscle weakness (generalized): Secondary | ICD-10-CM | POA: Diagnosis not present

## 2020-08-12 DIAGNOSIS — Z9889 Other specified postprocedural states: Secondary | ICD-10-CM | POA: Diagnosis not present

## 2020-08-12 DIAGNOSIS — J189 Pneumonia, unspecified organism: Secondary | ICD-10-CM | POA: Diagnosis not present

## 2020-08-12 DIAGNOSIS — F419 Anxiety disorder, unspecified: Secondary | ICD-10-CM | POA: Diagnosis not present

## 2020-08-12 DIAGNOSIS — R5381 Other malaise: Secondary | ICD-10-CM | POA: Diagnosis not present

## 2020-08-12 DIAGNOSIS — Z20822 Contact with and (suspected) exposure to covid-19: Secondary | ICD-10-CM | POA: Diagnosis present

## 2020-08-12 DIAGNOSIS — M810 Age-related osteoporosis without current pathological fracture: Secondary | ICD-10-CM | POA: Diagnosis present

## 2020-08-12 DIAGNOSIS — F429 Obsessive-compulsive disorder, unspecified: Secondary | ICD-10-CM | POA: Diagnosis present

## 2020-08-12 DIAGNOSIS — F445 Conversion disorder with seizures or convulsions: Secondary | ICD-10-CM | POA: Diagnosis not present

## 2020-08-13 HISTORY — PX: HUMERUS FRACTURE SURGERY: SHX670

## 2020-08-14 ENCOUNTER — Ambulatory Visit (HOSPITAL_COMMUNITY): Payer: Medicare Other

## 2020-08-14 ENCOUNTER — Encounter (HOSPITAL_COMMUNITY): Payer: Self-pay

## 2020-08-14 DIAGNOSIS — E785 Hyperlipidemia, unspecified: Secondary | ICD-10-CM | POA: Diagnosis not present

## 2020-08-14 DIAGNOSIS — G20 Parkinson's disease: Secondary | ICD-10-CM | POA: Diagnosis not present

## 2020-08-14 DIAGNOSIS — F419 Anxiety disorder, unspecified: Secondary | ICD-10-CM | POA: Diagnosis not present

## 2020-08-14 DIAGNOSIS — F321 Major depressive disorder, single episode, moderate: Secondary | ICD-10-CM | POA: Diagnosis not present

## 2020-08-14 DIAGNOSIS — K219 Gastro-esophageal reflux disease without esophagitis: Secondary | ICD-10-CM | POA: Diagnosis not present

## 2020-08-14 DIAGNOSIS — S42291A Other displaced fracture of upper end of right humerus, initial encounter for closed fracture: Secondary | ICD-10-CM | POA: Diagnosis not present

## 2020-08-14 DIAGNOSIS — E039 Hypothyroidism, unspecified: Secondary | ICD-10-CM | POA: Diagnosis not present

## 2020-08-14 DIAGNOSIS — Z9889 Other specified postprocedural states: Secondary | ICD-10-CM | POA: Diagnosis not present

## 2020-08-14 DIAGNOSIS — S42354D Nondisplaced comminuted fracture of shaft of humerus, right arm, subsequent encounter for fracture with routine healing: Secondary | ICD-10-CM | POA: Diagnosis not present

## 2020-08-14 DIAGNOSIS — F445 Conversion disorder with seizures or convulsions: Secondary | ICD-10-CM | POA: Diagnosis not present

## 2020-08-14 DIAGNOSIS — R262 Difficulty in walking, not elsewhere classified: Secondary | ICD-10-CM | POA: Diagnosis not present

## 2020-08-14 DIAGNOSIS — S42301A Unspecified fracture of shaft of humerus, right arm, initial encounter for closed fracture: Secondary | ICD-10-CM | POA: Diagnosis not present

## 2020-08-14 DIAGNOSIS — C73 Malignant neoplasm of thyroid gland: Secondary | ICD-10-CM | POA: Diagnosis not present

## 2020-08-14 DIAGNOSIS — J189 Pneumonia, unspecified organism: Secondary | ICD-10-CM | POA: Diagnosis not present

## 2020-08-14 DIAGNOSIS — Z8781 Personal history of (healed) traumatic fracture: Secondary | ICD-10-CM | POA: Diagnosis not present

## 2020-08-14 DIAGNOSIS — G8929 Other chronic pain: Secondary | ICD-10-CM | POA: Diagnosis not present

## 2020-08-14 DIAGNOSIS — M6281 Muscle weakness (generalized): Secondary | ICD-10-CM | POA: Diagnosis not present

## 2020-08-14 DIAGNOSIS — D508 Other iron deficiency anemias: Secondary | ICD-10-CM | POA: Diagnosis not present

## 2020-08-14 DIAGNOSIS — R5381 Other malaise: Secondary | ICD-10-CM | POA: Diagnosis not present

## 2020-08-14 DIAGNOSIS — F429 Obsessive-compulsive disorder, unspecified: Secondary | ICD-10-CM | POA: Diagnosis not present

## 2020-08-14 DIAGNOSIS — I517 Cardiomegaly: Secondary | ICD-10-CM | POA: Diagnosis not present

## 2020-08-14 DIAGNOSIS — R059 Cough, unspecified: Secondary | ICD-10-CM | POA: Diagnosis not present

## 2020-08-14 DIAGNOSIS — R41841 Cognitive communication deficit: Secondary | ICD-10-CM | POA: Diagnosis not present

## 2020-08-14 DIAGNOSIS — Z9181 History of falling: Secondary | ICD-10-CM | POA: Diagnosis not present

## 2020-08-14 DIAGNOSIS — S42354A Nondisplaced comminuted fracture of shaft of humerus, right arm, initial encounter for closed fracture: Secondary | ICD-10-CM | POA: Diagnosis not present

## 2020-08-14 DIAGNOSIS — I10 Essential (primary) hypertension: Secondary | ICD-10-CM | POA: Diagnosis not present

## 2020-08-14 DIAGNOSIS — E041 Nontoxic single thyroid nodule: Secondary | ICD-10-CM | POA: Diagnosis not present

## 2020-08-25 DIAGNOSIS — E785 Hyperlipidemia, unspecified: Secondary | ICD-10-CM | POA: Diagnosis not present

## 2020-08-25 DIAGNOSIS — E039 Hypothyroidism, unspecified: Secondary | ICD-10-CM | POA: Diagnosis not present

## 2020-08-25 DIAGNOSIS — F429 Obsessive-compulsive disorder, unspecified: Secondary | ICD-10-CM | POA: Diagnosis not present

## 2020-08-25 DIAGNOSIS — D508 Other iron deficiency anemias: Secondary | ICD-10-CM | POA: Diagnosis not present

## 2020-08-25 DIAGNOSIS — R5381 Other malaise: Secondary | ICD-10-CM | POA: Diagnosis not present

## 2020-08-25 DIAGNOSIS — K219 Gastro-esophageal reflux disease without esophagitis: Secondary | ICD-10-CM | POA: Diagnosis not present

## 2020-08-26 ENCOUNTER — Ambulatory Visit: Payer: Medicare Other | Admitting: "Endocrinology

## 2020-08-26 ENCOUNTER — Ambulatory Visit: Payer: Self-pay | Admitting: Nurse Practitioner

## 2020-08-27 DIAGNOSIS — Z8781 Personal history of (healed) traumatic fracture: Secondary | ICD-10-CM | POA: Diagnosis not present

## 2020-08-27 DIAGNOSIS — Z9889 Other specified postprocedural states: Secondary | ICD-10-CM | POA: Diagnosis not present

## 2020-09-01 DIAGNOSIS — K219 Gastro-esophageal reflux disease without esophagitis: Secondary | ICD-10-CM | POA: Diagnosis not present

## 2020-09-01 DIAGNOSIS — D508 Other iron deficiency anemias: Secondary | ICD-10-CM | POA: Diagnosis not present

## 2020-09-01 DIAGNOSIS — R5381 Other malaise: Secondary | ICD-10-CM | POA: Diagnosis not present

## 2020-09-01 DIAGNOSIS — E039 Hypothyroidism, unspecified: Secondary | ICD-10-CM | POA: Diagnosis not present

## 2020-09-01 DIAGNOSIS — E785 Hyperlipidemia, unspecified: Secondary | ICD-10-CM | POA: Diagnosis not present

## 2020-09-08 DIAGNOSIS — R5381 Other malaise: Secondary | ICD-10-CM | POA: Diagnosis not present

## 2020-09-08 DIAGNOSIS — K219 Gastro-esophageal reflux disease without esophagitis: Secondary | ICD-10-CM | POA: Diagnosis not present

## 2020-09-08 DIAGNOSIS — F429 Obsessive-compulsive disorder, unspecified: Secondary | ICD-10-CM | POA: Diagnosis not present

## 2020-09-08 DIAGNOSIS — E039 Hypothyroidism, unspecified: Secondary | ICD-10-CM | POA: Diagnosis not present

## 2020-09-08 DIAGNOSIS — D508 Other iron deficiency anemias: Secondary | ICD-10-CM | POA: Diagnosis not present

## 2020-09-08 DIAGNOSIS — I10 Essential (primary) hypertension: Secondary | ICD-10-CM | POA: Diagnosis not present

## 2020-09-08 DIAGNOSIS — E785 Hyperlipidemia, unspecified: Secondary | ICD-10-CM | POA: Diagnosis not present

## 2020-09-09 ENCOUNTER — Other Ambulatory Visit: Payer: Self-pay | Admitting: Nurse Practitioner

## 2020-09-09 DIAGNOSIS — F411 Generalized anxiety disorder: Secondary | ICD-10-CM

## 2020-09-11 DIAGNOSIS — I10 Essential (primary) hypertension: Secondary | ICD-10-CM | POA: Diagnosis not present

## 2020-09-11 DIAGNOSIS — K219 Gastro-esophageal reflux disease without esophagitis: Secondary | ICD-10-CM | POA: Diagnosis not present

## 2020-09-11 DIAGNOSIS — E039 Hypothyroidism, unspecified: Secondary | ICD-10-CM | POA: Diagnosis not present

## 2020-09-11 DIAGNOSIS — R5381 Other malaise: Secondary | ICD-10-CM | POA: Diagnosis not present

## 2020-09-15 DIAGNOSIS — E039 Hypothyroidism, unspecified: Secondary | ICD-10-CM | POA: Diagnosis not present

## 2020-09-15 DIAGNOSIS — K219 Gastro-esophageal reflux disease without esophagitis: Secondary | ICD-10-CM | POA: Diagnosis not present

## 2020-09-15 DIAGNOSIS — F429 Obsessive-compulsive disorder, unspecified: Secondary | ICD-10-CM | POA: Diagnosis not present

## 2020-09-15 DIAGNOSIS — E785 Hyperlipidemia, unspecified: Secondary | ICD-10-CM | POA: Diagnosis not present

## 2020-09-15 DIAGNOSIS — R5381 Other malaise: Secondary | ICD-10-CM | POA: Diagnosis not present

## 2020-09-15 DIAGNOSIS — J189 Pneumonia, unspecified organism: Secondary | ICD-10-CM | POA: Diagnosis not present

## 2020-09-15 DIAGNOSIS — D508 Other iron deficiency anemias: Secondary | ICD-10-CM | POA: Diagnosis not present

## 2020-09-15 DIAGNOSIS — I10 Essential (primary) hypertension: Secondary | ICD-10-CM | POA: Diagnosis not present

## 2020-09-22 ENCOUNTER — Other Ambulatory Visit: Payer: Self-pay

## 2020-09-22 ENCOUNTER — Ambulatory Visit (INDEPENDENT_AMBULATORY_CARE_PROVIDER_SITE_OTHER): Payer: Medicare Other | Admitting: "Endocrinology

## 2020-09-22 ENCOUNTER — Encounter: Payer: Self-pay | Admitting: "Endocrinology

## 2020-09-22 VITALS — BP 120/72 | HR 92 | Ht 65.0 in | Wt 222.2 lb

## 2020-09-22 DIAGNOSIS — E039 Hypothyroidism, unspecified: Secondary | ICD-10-CM

## 2020-09-22 DIAGNOSIS — C73 Malignant neoplasm of thyroid gland: Secondary | ICD-10-CM | POA: Diagnosis not present

## 2020-09-22 MED ORDER — LEVOTHYROXINE SODIUM 50 MCG PO TABS: 50.0000 ug | ORAL_TABLET | Freq: Every day | ORAL | 1 refills | Status: DC

## 2020-09-22 NOTE — Progress Notes (Signed)
09/22/2020, 1:17 PM  Endocrinology follow-up note   Subjective:    Patient ID: Jinny Sanders, female    DOB: [birth year], PCP Patient, No Pcp Per   Past Medical History:  Diagnosis Date  . Anxiety   . Depression   . GERD (gastroesophageal reflux disease)   . Hyperlipidemia   . Mental developmental delay   . OCD (obsessive compulsive disorder)   . Osteoporosis   . Thyroid disease    Past Surgical History:  Procedure Laterality Date  . BIOPSY  11/02/2019   Procedure: BIOPSY;  Surgeon: Gatha Mayer, MD;  Location: [facility];  Service: Endoscopy;;  . COLONOSCOPY WITH PROPOFOL N/A 11/02/2019   Procedure: COLONOSCOPY WITH PROPOFOL;  Surgeon: Gatha Mayer, MD;  Location: [facility];  Service: Endoscopy;  Laterality: N/A;  . ESOPHAGOGASTRODUODENOSCOPY (EGD) WITH PROPOFOL N/A 11/01/2019   Procedure: ESOPHAGOGASTRODUODENOSCOPY (EGD) WITH PROPOFOL;  Surgeon: Gatha Mayer, MD;  Location: [facility];  Service: Endoscopy;  Laterality: N/A;  . TONSILLECTOMY     Social History   Socioeconomic History  . Marital status: Single    Spouse name: Not on file  . Number of children: 0  . Years of education: 61  . Highest education level: 12th grade  Occupational History  . Occupation: retired    Comment: Special educational needs teacher  Tobacco Use  . Smoking status: Never Smoker  . Smokeless tobacco: Never Used  Vaping Use  . Vaping Use: Never used  Substance and Sexual Activity  . Alcohol use: No  . Drug use: No  . Sexual activity: Never  Other Topics Concern  . Not on file  Social History Narrative  . Not on file   Social Determinants of Health   Financial Resource Strain: Not on file  Food Insecurity: Not on file  Transportation Needs: Not on file  Physical Activity: Not on file  Stress: Not on file  Social Connections: Not on file   Family History  Problem Relation Age of Onset  . Diabetes Father   . Hyperlipidemia Father    . Hypertension Father   . Atrial fibrillation Father   . Heart attack Father   . Osteoporosis Father   . Hyperlipidemia Sister   . Dysphagia Sister   . GI problems Mother   . Hypertension Mother   . Hyperlipidemia Mother   . Heart disease Maternal Grandfather   . Colon cancer Maternal Grandfather    Outpatient Encounter Medications as of 09/22/2020  Medication Sig  . ascorbic acid (VITAMIN C) 500 MG tablet Take 1 tablet (500 mg total) by mouth daily.  . benzonatate (TESSALON PERLES) 100 MG capsule Take 1 capsule (100 mg total) by mouth 3 (three) times daily as needed for cough.  . Calcium-Vitamin D-Vitamin K 500-100-40 MG-UNT-MCG CHEW Chew 1 tablet by mouth 2 (two) times daily.   Marland Kitchen escitalopram (LEXAPRO) 20 MG tablet Take 1 tablet (20 mg total) by mouth daily.  . ferrous sulfate 325 (65 FE) MG tablet Take 1 tablet (325 mg total) by mouth daily with breakfast.  . fluconazole (DIFLUCAN) 150 MG tablet Take 1 tablet (150 mg total) by mouth once a week. (Patient not taking: Reported on 09/22/2020)  . levothyroxine (SYNTHROID) 50 MCG  tablet Take 1 tablet (50 mcg total) by mouth daily before breakfast.  . LORazepam (ATIVAN) 0.5 MG tablet Take 1 tablet (0.5 mg total) by mouth 3 (three) times daily.  Marland Kitchen lovastatin (MEVACOR) 40 MG tablet Take 1 tablet (40 mg total) by mouth at bedtime.  Marland Kitchen nystatin (MYCOSTATIN/NYSTOP) powder Apply 1 application topically 3 (three) times daily.  Marland Kitchen nystatin cream (MYCOSTATIN) Apply to affected area 2 times daily  . ondansetron (ZOFRAN) 4 MG tablet Take 1 tablet (4 mg total) by mouth every 6 (six) hours as needed for nausea. (Patient not taking: No sig reported)  . pantoprazole (PROTONIX) 40 MG tablet TAKE 1 TABLET DAILY  . risperiDONE (RISPERDAL) 0.25 MG tablet TAKE 1 TABLET IN THE MORNING AND 2 TABLETS AT BEDTIME (Patient taking differently: TAKE 1 TABLET IN THE MORNING AND 1 TABLETS AT BEDTIME)  . [DISCONTINUED] levothyroxine (SYNTHROID) 50 MCG tablet Take 1 tablet  (50 mcg total) by mouth daily.   No facility-administered encounter medications on file as of 09/22/2020.   ALLERGIES: Allergies  Allergen Reactions  . Keflex [Cephalexin] Other (See Comments)    Dizzy, lightheaded, falls  . Doxycycline Rash    VACCINATION STATUS: Immunization History  Administered Date(s) Administered  . Influenza, High Dose Seasonal PF 05/14/2016, 05/06/2017, 05/09/2018  . Influenza,inj,Quad PF,6+ Mos 05/18/2013, 05/07/2014, 05/06/2015  . Pneumococcal Conjugate-13 01/17/2014  . Pneumococcal Polysaccharide-23 07/01/2015  . Tdap 05/31/2011, 05/11/2020  . Zoster 03/04/2014  . Zoster Recombinat (Shingrix) 07/04/2017, 10/18/2017    HPI Renee Davidson is 70 y.o. female who presents today with a medical history as above. she is being seen in follow-up after she was seen in consultation for nodular goiter, hypothyroidism requested by her PCP.    She is accompanied by her [name] to the clinic.  She is known to have hypothyroidism for at least 10 years, currently on Synthroid 50 mcg p.o. daily before breakfast.  Her recent thyroid function tests are consistent with appropriate replacement.  In November 2021 she was found to have incidental thyroid nodule of around the right lobe of her thyroid on CT scan of the chest.  Subsequent ultrasound confirmed 3 cm nodule on the right lobe with suspicious features.  She underwent fine-needle aspiration on November 24 which revealed atypia of undetermined significance.  A sample was sent for Afirma, results are consistent with approximately 50% risk of malignancy.  She was hospitalized in the interval for right humeral fracture which needed open reduction and internal fixation.  She is recovering from the surgery.  Currently planning to settle in the nursing home.  She denies dysphagia, shortness of breath, no voice change.  She has no family history of thyroid malignancy.  She reports minimally fluctuating body weight.  She uses a  walker to get around due to disequilibrium.  She wishes to proceed with appropriate intervention for thyroid malignancy.   Review of Systems  Constitutional: no recent weight gain/loss, no fatigue, no subjective hyperthermia, no subjective hypothermia   Objective:    Vitals with BMI 09/22/2020 07/15/2020 07/10/2020  Height 5\' 5"  5\' 5"  -  Weight 222 lbs 3 oz 217 lbs -  BMI 47.65 46.50 -  Systolic 354 656 812  Diastolic 72 52 59  Pulse 92 64 79    BP 120/72   Pulse 92   Ht 5\' 5"  (1.651 m)   Wt 222 lb 3.2 oz (100.8 kg)   BMI 36.98 kg/m   Wt Readings from Last 3 Encounters:  09/22/20 222 lb  3.2 oz (100.8 kg)  07/15/20 217 lb (98.4 kg)  06/05/20 222 lb (100.7 kg)    Physical Exam  Constitutional:  Body mass index is 36.98 kg/m.,  not in acute distress, normal state of mind Eyes: PERRLA, EOMI, no exophthalmos ENT: moist mucous membranes, + palpable thyroid,  gross cervical lymphadenopathy   CMP ( most recent) CMP     Component Value Date/Time   NA 139 07/10/2020 1942   NA 139 02/22/2020 1252   K 4.3 07/10/2020 1942   CL 105 07/10/2020 1942   CO2 23 07/10/2020 1942   GLUCOSE 92 07/10/2020 1942   BUN 14 07/10/2020 1942   BUN 11 02/22/2020 1252   CREATININE 0.90 07/10/2020 1942   CALCIUM 9.1 07/10/2020 1942   PROT 7.6 07/10/2020 1942   PROT 6.6 02/22/2020 1252   ALBUMIN 4.0 07/10/2020 1942   ALBUMIN 4.0 02/22/2020 1252   AST 20 07/10/2020 1942   ALT 16 07/10/2020 1942   ALKPHOS 109 07/10/2020 1942   BILITOT 0.6 07/10/2020 1942   BILITOT 0.3 02/22/2020 1252   GFRNONAA >60 07/10/2020 1942   GFRAA >60 04/19/2020 1219     Diabetic Labs (most recent): Lab Results  Component Value Date   HGBA1C 5.7 06/08/2017     Lipid Panel ( most recent) Lipid Panel     Component Value Date/Time   CHOL 163 02/22/2020 1252   TRIG 74 02/22/2020 1252   TRIG 49 12/18/2014 1209   HDL 53 02/22/2020 1252   HDL 55 12/18/2014 1209   CHOLHDL 3.1 02/22/2020 1252   LDLCALC 96  02/22/2020 1252   LDLCALC 89 01/17/2014 0946   LABVLDL 14 02/22/2020 1252      Lab Results  Component Value Date   TSH 0.893 02/22/2020   TSH 0.761 12/04/2019   TSH 2.407 10/31/2019   TSH 2.130 03/26/2019   TSH 1.360 07/07/2018   TSH 0.282 (L) 05/09/2018   TSH 0.428 (L) 11/04/2017   TSH 0.727 06/06/2017   TSH 1.420 12/06/2016   TSH 0.927 04/23/2016    Fine-needle aspiration of right lobe thyroid nodule on June 25, 2020 FINAL MICROSCOPIC DIAGNOSIS:  - Atypia of undetermined significance (Bethesda category III)   SPECIMEN ADEQUACY:  Satisfactory for evaluation   DIAGNOSTIC COMMENTS:  This specimen will be sent for Afirma testing.   Molecular pathology results were received, indicating approximately 50% risk of malignancy.   Assessment & Plan:   1.  Malignant neoplasm of the thyroid 2. Acquired hypothyroidism   I discussed molecular pathology results with her and her sister.  Based option of treatment will be parathyroidectomy.  Patient wishes to proceed with same.  I discussed and arrange referral to Dr. Armandina Gemma in [place] [facility].  In light of her longstanding hypothyroidism, recent thyroid function tests are consistent with appropriate replacement with Synthroid 50 mcg p.o. daily before breakfast.  - We discussed about the correct intake of her thyroid hormone, on empty stomach at fasting, with water, separated by at least 30 minutes from breakfast and other medications,  and separated by more than 4 hours from calcium, iron, multivitamins, acid reflux medications (PPIs). -Patient is made aware of the fact that thyroid hormone replacement is needed for life, dose to be adjusted by periodic monitoring of thyroid function tests.  - I did not initiate any new prescriptions today. - she is advised to maintain close follow up with Patient, No Pcp Per for primary care needs.     -  Time spent on this patient care encounter:  30  minutes of which 50% was spent in  counseling and the rest reviewing  her current and  previous labs / studies and medications  doses and developing a plan for long term care, and documenting this care. Garvin Fila Fuente  participated in the discussions, expressed understanding, and voiced agreement with the above plans.  All questions were answered to her satisfaction. she is encouraged to contact clinic should she have any questions or concerns prior to her return visit.   Follow up plan: Return in about 6 weeks (around 11/03/2020) for F/U with Labs after Surgery.   Glade Lloyd, MD [facility] [address] [place], [place] [address] Phone: [phone]  Fax: [phone]     09/22/2020, 1:17 PM  This note was partially dictated with voice recognition software. Similar sounding words can be transcribed inadequately or may not  be corrected upon review.

## 2020-09-24 DIAGNOSIS — R5381 Other malaise: Secondary | ICD-10-CM | POA: Diagnosis not present

## 2020-09-24 DIAGNOSIS — E785 Hyperlipidemia, unspecified: Secondary | ICD-10-CM | POA: Diagnosis not present

## 2020-09-24 DIAGNOSIS — I10 Essential (primary) hypertension: Secondary | ICD-10-CM | POA: Diagnosis not present

## 2020-09-24 DIAGNOSIS — K219 Gastro-esophageal reflux disease without esophagitis: Secondary | ICD-10-CM | POA: Diagnosis not present

## 2020-09-24 DIAGNOSIS — Z4789 Encounter for other orthopedic aftercare: Secondary | ICD-10-CM | POA: Diagnosis not present

## 2020-09-24 DIAGNOSIS — F429 Obsessive-compulsive disorder, unspecified: Secondary | ICD-10-CM | POA: Diagnosis not present

## 2020-09-24 DIAGNOSIS — Z8781 Personal history of (healed) traumatic fracture: Secondary | ICD-10-CM | POA: Diagnosis not present

## 2020-09-24 DIAGNOSIS — E039 Hypothyroidism, unspecified: Secondary | ICD-10-CM | POA: Diagnosis not present

## 2020-09-24 DIAGNOSIS — M6281 Muscle weakness (generalized): Secondary | ICD-10-CM | POA: Diagnosis not present

## 2020-09-24 DIAGNOSIS — D508 Other iron deficiency anemias: Secondary | ICD-10-CM | POA: Diagnosis not present

## 2020-09-24 DIAGNOSIS — J189 Pneumonia, unspecified organism: Secondary | ICD-10-CM | POA: Diagnosis not present

## 2020-09-25 DIAGNOSIS — J189 Pneumonia, unspecified organism: Secondary | ICD-10-CM | POA: Diagnosis not present

## 2020-09-25 DIAGNOSIS — I10 Essential (primary) hypertension: Secondary | ICD-10-CM | POA: Diagnosis not present

## 2020-09-25 DIAGNOSIS — R5381 Other malaise: Secondary | ICD-10-CM | POA: Diagnosis not present

## 2020-09-25 DIAGNOSIS — E039 Hypothyroidism, unspecified: Secondary | ICD-10-CM | POA: Diagnosis not present

## 2020-09-25 DIAGNOSIS — E785 Hyperlipidemia, unspecified: Secondary | ICD-10-CM | POA: Diagnosis not present

## 2020-09-25 DIAGNOSIS — F429 Obsessive-compulsive disorder, unspecified: Secondary | ICD-10-CM | POA: Diagnosis not present

## 2020-09-25 DIAGNOSIS — K219 Gastro-esophageal reflux disease without esophagitis: Secondary | ICD-10-CM | POA: Diagnosis not present

## 2020-09-25 DIAGNOSIS — D508 Other iron deficiency anemias: Secondary | ICD-10-CM | POA: Diagnosis not present

## 2020-09-25 DIAGNOSIS — M6281 Muscle weakness (generalized): Secondary | ICD-10-CM | POA: Diagnosis not present

## 2020-09-26 DIAGNOSIS — M6281 Muscle weakness (generalized): Secondary | ICD-10-CM | POA: Diagnosis not present

## 2020-09-27 DIAGNOSIS — M6281 Muscle weakness (generalized): Secondary | ICD-10-CM | POA: Diagnosis not present

## 2020-09-29 DIAGNOSIS — E785 Hyperlipidemia, unspecified: Secondary | ICD-10-CM | POA: Diagnosis not present

## 2020-09-29 DIAGNOSIS — I10 Essential (primary) hypertension: Secondary | ICD-10-CM | POA: Diagnosis not present

## 2020-09-29 DIAGNOSIS — R059 Cough, unspecified: Secondary | ICD-10-CM | POA: Diagnosis not present

## 2020-09-29 DIAGNOSIS — R5381 Other malaise: Secondary | ICD-10-CM | POA: Diagnosis not present

## 2020-09-29 DIAGNOSIS — E039 Hypothyroidism, unspecified: Secondary | ICD-10-CM | POA: Diagnosis not present

## 2020-09-29 DIAGNOSIS — M6281 Muscle weakness (generalized): Secondary | ICD-10-CM | POA: Diagnosis not present

## 2020-09-29 DIAGNOSIS — J189 Pneumonia, unspecified organism: Secondary | ICD-10-CM | POA: Diagnosis not present

## 2020-09-29 DIAGNOSIS — F429 Obsessive-compulsive disorder, unspecified: Secondary | ICD-10-CM | POA: Diagnosis not present

## 2020-09-29 DIAGNOSIS — K219 Gastro-esophageal reflux disease without esophagitis: Secondary | ICD-10-CM | POA: Diagnosis not present

## 2020-09-29 DIAGNOSIS — D508 Other iron deficiency anemias: Secondary | ICD-10-CM | POA: Diagnosis not present

## 2020-09-30 DIAGNOSIS — R059 Cough, unspecified: Secondary | ICD-10-CM | POA: Diagnosis not present

## 2020-09-30 DIAGNOSIS — R41841 Cognitive communication deficit: Secondary | ICD-10-CM | POA: Diagnosis not present

## 2020-09-30 DIAGNOSIS — M6281 Muscle weakness (generalized): Secondary | ICD-10-CM | POA: Diagnosis not present

## 2020-10-01 DIAGNOSIS — E039 Hypothyroidism, unspecified: Secondary | ICD-10-CM | POA: Diagnosis not present

## 2020-10-01 DIAGNOSIS — M6281 Muscle weakness (generalized): Secondary | ICD-10-CM | POA: Diagnosis not present

## 2020-10-01 DIAGNOSIS — R41841 Cognitive communication deficit: Secondary | ICD-10-CM | POA: Diagnosis not present

## 2020-10-02 DIAGNOSIS — R41841 Cognitive communication deficit: Secondary | ICD-10-CM | POA: Diagnosis not present

## 2020-10-02 DIAGNOSIS — M6281 Muscle weakness (generalized): Secondary | ICD-10-CM | POA: Diagnosis not present

## 2020-10-02 DIAGNOSIS — E785 Hyperlipidemia, unspecified: Secondary | ICD-10-CM | POA: Diagnosis not present

## 2020-10-03 DIAGNOSIS — M6281 Muscle weakness (generalized): Secondary | ICD-10-CM | POA: Diagnosis not present

## 2020-10-03 DIAGNOSIS — R41841 Cognitive communication deficit: Secondary | ICD-10-CM | POA: Diagnosis not present

## 2020-10-04 DIAGNOSIS — R41841 Cognitive communication deficit: Secondary | ICD-10-CM | POA: Diagnosis not present

## 2020-10-04 DIAGNOSIS — M6281 Muscle weakness (generalized): Secondary | ICD-10-CM | POA: Diagnosis not present

## 2020-10-06 DIAGNOSIS — I10 Essential (primary) hypertension: Secondary | ICD-10-CM | POA: Diagnosis not present

## 2020-10-06 DIAGNOSIS — D508 Other iron deficiency anemias: Secondary | ICD-10-CM | POA: Diagnosis not present

## 2020-10-06 DIAGNOSIS — R41841 Cognitive communication deficit: Secondary | ICD-10-CM | POA: Diagnosis not present

## 2020-10-06 DIAGNOSIS — M6281 Muscle weakness (generalized): Secondary | ICD-10-CM | POA: Diagnosis not present

## 2020-10-06 DIAGNOSIS — K219 Gastro-esophageal reflux disease without esophagitis: Secondary | ICD-10-CM | POA: Diagnosis not present

## 2020-10-06 DIAGNOSIS — R5381 Other malaise: Secondary | ICD-10-CM | POA: Diagnosis not present

## 2020-10-06 DIAGNOSIS — C73 Malignant neoplasm of thyroid gland: Secondary | ICD-10-CM | POA: Diagnosis not present

## 2020-10-06 DIAGNOSIS — R0989 Other specified symptoms and signs involving the circulatory and respiratory systems: Secondary | ICD-10-CM | POA: Diagnosis not present

## 2020-10-06 DIAGNOSIS — E039 Hypothyroidism, unspecified: Secondary | ICD-10-CM | POA: Diagnosis not present

## 2020-10-06 DIAGNOSIS — E785 Hyperlipidemia, unspecified: Secondary | ICD-10-CM | POA: Diagnosis not present

## 2020-10-07 DIAGNOSIS — R059 Cough, unspecified: Secondary | ICD-10-CM | POA: Diagnosis not present

## 2020-10-07 DIAGNOSIS — M6281 Muscle weakness (generalized): Secondary | ICD-10-CM | POA: Diagnosis not present

## 2020-10-07 DIAGNOSIS — R41841 Cognitive communication deficit: Secondary | ICD-10-CM | POA: Diagnosis not present

## 2020-10-08 DIAGNOSIS — M6281 Muscle weakness (generalized): Secondary | ICD-10-CM | POA: Diagnosis not present

## 2020-10-08 DIAGNOSIS — R41841 Cognitive communication deficit: Secondary | ICD-10-CM | POA: Diagnosis not present

## 2020-10-09 DIAGNOSIS — M6281 Muscle weakness (generalized): Secondary | ICD-10-CM | POA: Diagnosis not present

## 2020-10-09 DIAGNOSIS — R41841 Cognitive communication deficit: Secondary | ICD-10-CM | POA: Diagnosis not present

## 2020-10-10 DIAGNOSIS — M6281 Muscle weakness (generalized): Secondary | ICD-10-CM | POA: Diagnosis not present

## 2020-10-10 DIAGNOSIS — R41841 Cognitive communication deficit: Secondary | ICD-10-CM | POA: Diagnosis not present

## 2020-10-11 DIAGNOSIS — R41841 Cognitive communication deficit: Secondary | ICD-10-CM | POA: Diagnosis not present

## 2020-10-11 DIAGNOSIS — M6281 Muscle weakness (generalized): Secondary | ICD-10-CM | POA: Diagnosis not present

## 2020-10-13 DIAGNOSIS — G8929 Other chronic pain: Secondary | ICD-10-CM | POA: Diagnosis not present

## 2020-10-13 DIAGNOSIS — F445 Conversion disorder with seizures or convulsions: Secondary | ICD-10-CM | POA: Diagnosis not present

## 2020-10-13 DIAGNOSIS — G20 Parkinson's disease: Secondary | ICD-10-CM | POA: Diagnosis not present

## 2020-10-13 DIAGNOSIS — R41841 Cognitive communication deficit: Secondary | ICD-10-CM | POA: Diagnosis not present

## 2020-10-13 DIAGNOSIS — F321 Major depressive disorder, single episode, moderate: Secondary | ICD-10-CM | POA: Diagnosis not present

## 2020-10-13 DIAGNOSIS — M6281 Muscle weakness (generalized): Secondary | ICD-10-CM | POA: Diagnosis not present

## 2020-10-14 DIAGNOSIS — I509 Heart failure, unspecified: Secondary | ICD-10-CM | POA: Diagnosis not present

## 2020-10-14 DIAGNOSIS — R0600 Dyspnea, unspecified: Secondary | ICD-10-CM | POA: Diagnosis not present

## 2020-10-14 DIAGNOSIS — R41841 Cognitive communication deficit: Secondary | ICD-10-CM | POA: Diagnosis not present

## 2020-10-14 DIAGNOSIS — W19XXXD Unspecified fall, subsequent encounter: Secondary | ICD-10-CM | POA: Diagnosis not present

## 2020-10-14 DIAGNOSIS — M6281 Muscle weakness (generalized): Secondary | ICD-10-CM | POA: Diagnosis not present

## 2020-10-15 DIAGNOSIS — I10 Essential (primary) hypertension: Secondary | ICD-10-CM | POA: Diagnosis not present

## 2020-10-15 DIAGNOSIS — D508 Other iron deficiency anemias: Secondary | ICD-10-CM | POA: Diagnosis not present

## 2020-10-15 DIAGNOSIS — C73 Malignant neoplasm of thyroid gland: Secondary | ICD-10-CM | POA: Diagnosis not present

## 2020-10-15 DIAGNOSIS — R5381 Other malaise: Secondary | ICD-10-CM | POA: Diagnosis not present

## 2020-10-15 DIAGNOSIS — K219 Gastro-esophageal reflux disease without esophagitis: Secondary | ICD-10-CM | POA: Diagnosis not present

## 2020-10-15 DIAGNOSIS — R059 Cough, unspecified: Secondary | ICD-10-CM | POA: Diagnosis not present

## 2020-10-15 DIAGNOSIS — F429 Obsessive-compulsive disorder, unspecified: Secondary | ICD-10-CM | POA: Diagnosis not present

## 2020-10-15 DIAGNOSIS — E039 Hypothyroidism, unspecified: Secondary | ICD-10-CM | POA: Diagnosis not present

## 2020-10-15 DIAGNOSIS — R0602 Shortness of breath: Secondary | ICD-10-CM | POA: Diagnosis not present

## 2020-10-15 DIAGNOSIS — E785 Hyperlipidemia, unspecified: Secondary | ICD-10-CM | POA: Diagnosis not present

## 2020-10-16 DIAGNOSIS — E039 Hypothyroidism, unspecified: Secondary | ICD-10-CM | POA: Diagnosis not present

## 2020-10-16 DIAGNOSIS — R41841 Cognitive communication deficit: Secondary | ICD-10-CM | POA: Diagnosis not present

## 2020-10-16 DIAGNOSIS — M6281 Muscle weakness (generalized): Secondary | ICD-10-CM | POA: Diagnosis not present

## 2020-10-16 DIAGNOSIS — R0602 Shortness of breath: Secondary | ICD-10-CM | POA: Diagnosis not present

## 2020-10-16 DIAGNOSIS — I219 Acute myocardial infarction, unspecified: Secondary | ICD-10-CM | POA: Diagnosis not present

## 2020-10-17 DIAGNOSIS — M6281 Muscle weakness (generalized): Secondary | ICD-10-CM | POA: Diagnosis not present

## 2020-10-17 DIAGNOSIS — R41841 Cognitive communication deficit: Secondary | ICD-10-CM | POA: Diagnosis not present

## 2020-10-18 DIAGNOSIS — M6281 Muscle weakness (generalized): Secondary | ICD-10-CM | POA: Diagnosis not present

## 2020-10-18 DIAGNOSIS — R41841 Cognitive communication deficit: Secondary | ICD-10-CM | POA: Diagnosis not present

## 2020-10-20 DIAGNOSIS — M6281 Muscle weakness (generalized): Secondary | ICD-10-CM | POA: Diagnosis not present

## 2020-10-20 DIAGNOSIS — R41841 Cognitive communication deficit: Secondary | ICD-10-CM | POA: Diagnosis not present

## 2020-10-21 DIAGNOSIS — M6281 Muscle weakness (generalized): Secondary | ICD-10-CM | POA: Diagnosis not present

## 2020-10-21 DIAGNOSIS — R41841 Cognitive communication deficit: Secondary | ICD-10-CM | POA: Diagnosis not present

## 2020-10-22 DIAGNOSIS — H524 Presbyopia: Secondary | ICD-10-CM | POA: Diagnosis not present

## 2020-10-22 DIAGNOSIS — R41841 Cognitive communication deficit: Secondary | ICD-10-CM | POA: Diagnosis not present

## 2020-10-22 DIAGNOSIS — H5213 Myopia, bilateral: Secondary | ICD-10-CM | POA: Diagnosis not present

## 2020-10-22 DIAGNOSIS — H52223 Regular astigmatism, bilateral: Secondary | ICD-10-CM | POA: Diagnosis not present

## 2020-10-22 DIAGNOSIS — H43813 Vitreous degeneration, bilateral: Secondary | ICD-10-CM | POA: Diagnosis not present

## 2020-10-22 DIAGNOSIS — M6281 Muscle weakness (generalized): Secondary | ICD-10-CM | POA: Diagnosis not present

## 2020-10-22 DIAGNOSIS — H2513 Age-related nuclear cataract, bilateral: Secondary | ICD-10-CM | POA: Diagnosis not present

## 2020-10-22 DIAGNOSIS — H53021 Refractive amblyopia, right eye: Secondary | ICD-10-CM | POA: Diagnosis not present

## 2020-10-23 DIAGNOSIS — M6281 Muscle weakness (generalized): Secondary | ICD-10-CM | POA: Diagnosis not present

## 2020-10-23 DIAGNOSIS — R41841 Cognitive communication deficit: Secondary | ICD-10-CM | POA: Diagnosis not present

## 2020-10-24 DIAGNOSIS — R41841 Cognitive communication deficit: Secondary | ICD-10-CM | POA: Diagnosis not present

## 2020-10-24 DIAGNOSIS — M6281 Muscle weakness (generalized): Secondary | ICD-10-CM | POA: Diagnosis not present

## 2020-10-25 DIAGNOSIS — R41841 Cognitive communication deficit: Secondary | ICD-10-CM | POA: Diagnosis not present

## 2020-10-25 DIAGNOSIS — M6281 Muscle weakness (generalized): Secondary | ICD-10-CM | POA: Diagnosis not present

## 2020-10-27 DIAGNOSIS — M6281 Muscle weakness (generalized): Secondary | ICD-10-CM | POA: Diagnosis not present

## 2020-10-27 DIAGNOSIS — E041 Nontoxic single thyroid nodule: Secondary | ICD-10-CM | POA: Diagnosis not present

## 2020-10-27 DIAGNOSIS — D440 Neoplasm of uncertain behavior of thyroid gland: Secondary | ICD-10-CM | POA: Diagnosis not present

## 2020-10-27 DIAGNOSIS — R41841 Cognitive communication deficit: Secondary | ICD-10-CM | POA: Diagnosis not present

## 2020-10-28 DIAGNOSIS — R41841 Cognitive communication deficit: Secondary | ICD-10-CM | POA: Diagnosis not present

## 2020-10-28 DIAGNOSIS — M6281 Muscle weakness (generalized): Secondary | ICD-10-CM | POA: Diagnosis not present

## 2020-10-29 DIAGNOSIS — R5381 Other malaise: Secondary | ICD-10-CM | POA: Diagnosis not present

## 2020-10-29 DIAGNOSIS — E785 Hyperlipidemia, unspecified: Secondary | ICD-10-CM | POA: Diagnosis not present

## 2020-10-29 DIAGNOSIS — C73 Malignant neoplasm of thyroid gland: Secondary | ICD-10-CM | POA: Diagnosis not present

## 2020-10-29 DIAGNOSIS — K219 Gastro-esophageal reflux disease without esophagitis: Secondary | ICD-10-CM | POA: Diagnosis not present

## 2020-10-29 DIAGNOSIS — R0989 Other specified symptoms and signs involving the circulatory and respiratory systems: Secondary | ICD-10-CM | POA: Diagnosis not present

## 2020-10-29 DIAGNOSIS — R41841 Cognitive communication deficit: Secondary | ICD-10-CM | POA: Diagnosis not present

## 2020-10-29 DIAGNOSIS — M6281 Muscle weakness (generalized): Secondary | ICD-10-CM | POA: Diagnosis not present

## 2020-10-29 DIAGNOSIS — R600 Localized edema: Secondary | ICD-10-CM | POA: Diagnosis not present

## 2020-10-29 DIAGNOSIS — D508 Other iron deficiency anemias: Secondary | ICD-10-CM | POA: Diagnosis not present

## 2020-10-29 DIAGNOSIS — I10 Essential (primary) hypertension: Secondary | ICD-10-CM | POA: Diagnosis not present

## 2020-10-30 DIAGNOSIS — R41841 Cognitive communication deficit: Secondary | ICD-10-CM | POA: Diagnosis not present

## 2020-10-30 DIAGNOSIS — M6281 Muscle weakness (generalized): Secondary | ICD-10-CM | POA: Diagnosis not present

## 2020-10-31 DIAGNOSIS — M6281 Muscle weakness (generalized): Secondary | ICD-10-CM | POA: Diagnosis not present

## 2020-10-31 DIAGNOSIS — R41841 Cognitive communication deficit: Secondary | ICD-10-CM | POA: Diagnosis not present

## 2020-11-01 DIAGNOSIS — M6281 Muscle weakness (generalized): Secondary | ICD-10-CM | POA: Diagnosis not present

## 2020-11-01 DIAGNOSIS — R41841 Cognitive communication deficit: Secondary | ICD-10-CM | POA: Diagnosis not present

## 2020-11-03 DIAGNOSIS — R41841 Cognitive communication deficit: Secondary | ICD-10-CM | POA: Diagnosis not present

## 2020-11-03 DIAGNOSIS — M6281 Muscle weakness (generalized): Secondary | ICD-10-CM | POA: Diagnosis not present

## 2020-11-04 DIAGNOSIS — R41841 Cognitive communication deficit: Secondary | ICD-10-CM | POA: Diagnosis not present

## 2020-11-04 DIAGNOSIS — M6281 Muscle weakness (generalized): Secondary | ICD-10-CM | POA: Diagnosis not present

## 2020-11-05 ENCOUNTER — Ambulatory Visit: Payer: Medicare Other | Admitting: "Endocrinology

## 2020-11-05 DIAGNOSIS — R41841 Cognitive communication deficit: Secondary | ICD-10-CM | POA: Diagnosis not present

## 2020-11-05 DIAGNOSIS — M6281 Muscle weakness (generalized): Secondary | ICD-10-CM | POA: Diagnosis not present

## 2020-11-06 DIAGNOSIS — R0600 Dyspnea, unspecified: Secondary | ICD-10-CM | POA: Diagnosis not present

## 2020-11-06 DIAGNOSIS — R41841 Cognitive communication deficit: Secondary | ICD-10-CM | POA: Diagnosis not present

## 2020-11-06 DIAGNOSIS — M6281 Muscle weakness (generalized): Secondary | ICD-10-CM | POA: Diagnosis not present

## 2020-11-06 DIAGNOSIS — W19XXXD Unspecified fall, subsequent encounter: Secondary | ICD-10-CM | POA: Diagnosis not present

## 2020-11-06 DIAGNOSIS — I509 Heart failure, unspecified: Secondary | ICD-10-CM | POA: Diagnosis not present

## 2020-11-07 DIAGNOSIS — R41841 Cognitive communication deficit: Secondary | ICD-10-CM | POA: Diagnosis not present

## 2020-11-07 DIAGNOSIS — M6281 Muscle weakness (generalized): Secondary | ICD-10-CM | POA: Diagnosis not present

## 2020-11-10 ENCOUNTER — Ambulatory Visit (INDEPENDENT_AMBULATORY_CARE_PROVIDER_SITE_OTHER): Payer: Medicare Other

## 2020-11-10 ENCOUNTER — Other Ambulatory Visit: Payer: Self-pay

## 2020-11-10 ENCOUNTER — Encounter: Payer: Self-pay | Admitting: Pulmonary Disease

## 2020-11-10 ENCOUNTER — Ambulatory Visit (INDEPENDENT_AMBULATORY_CARE_PROVIDER_SITE_OTHER): Payer: Medicare Other | Admitting: Pulmonary Disease

## 2020-11-10 VITALS — BP 130/80 | HR 70 | Temp 98.0°F | Ht 65.0 in | Wt 224.0 lb

## 2020-11-10 DIAGNOSIS — F321 Major depressive disorder, single episode, moderate: Secondary | ICD-10-CM | POA: Diagnosis not present

## 2020-11-10 DIAGNOSIS — F445 Conversion disorder with seizures or convulsions: Secondary | ICD-10-CM | POA: Diagnosis not present

## 2020-11-10 DIAGNOSIS — R0602 Shortness of breath: Secondary | ICD-10-CM | POA: Diagnosis not present

## 2020-11-10 DIAGNOSIS — G20 Parkinson's disease: Secondary | ICD-10-CM | POA: Diagnosis not present

## 2020-11-10 DIAGNOSIS — G8929 Other chronic pain: Secondary | ICD-10-CM | POA: Diagnosis not present

## 2020-11-10 DIAGNOSIS — F29 Unspecified psychosis not due to a substance or known physiological condition: Secondary | ICD-10-CM | POA: Diagnosis not present

## 2020-11-10 DIAGNOSIS — M6281 Muscle weakness (generalized): Secondary | ICD-10-CM | POA: Diagnosis not present

## 2020-11-10 DIAGNOSIS — R41841 Cognitive communication deficit: Secondary | ICD-10-CM | POA: Diagnosis not present

## 2020-11-10 NOTE — Progress Notes (Signed)
Renee Davidson    [id number]    11/28/1950  Primary Care Physician:Patient, No Pcp Per (Inactive)  Referring Physician: Verl Blalock, NP [address]  [place] [address]  Chief complaint:   Patient with cough, shortness of breath  HPI:  Evaluation for thyroid surgery  Has had a chronic cough Does have reflux for which she is on Protonix Denies any significant nasal stuffiness or congestion Has no past history of asthma, no history of obstructive lung disease Never smoker, no occupational predisposition  Shortness of breath is chronic  Has had multiple falls recently, has gained over 30 pounds in the last couple years  Uses a walker for ambulation  She is not acutely ill  Recently had echocardiogram showing normal ejection fraction  There is a report of abnormal chest x-ray in the past-unclear when this was done or where according to daughter  At some point was on oxygen supplementation Was given albuterol at some point  Resided in a nursing facility and currently assisted living following falls  Outpatient Encounter Medications as of 11/10/2020  Medication Sig  . acetaminophen (TYLENOL) 325 MG tablet Take 650 mg by mouth every 6 (six) hours as needed.  Marland Kitchen albuterol (VENTOLIN HFA) 108 (90 Base) MCG/ACT inhaler Inhale into the lungs every 6 (six) hours as needed for wheezing or shortness of breath.  Marland Kitchen ascorbic acid (VITAMIN C) 500 MG tablet Take 1 tablet (500 mg total) by mouth daily.  . benzonatate (TESSALON PERLES) 100 MG capsule Take 1 capsule (100 mg total) by mouth 3 (three) times daily as needed for cough.  . Calcium-Vitamin D-Vitamin K 500-100-40 MG-UNT-MCG CHEW Chew 1 tablet by mouth 2 (two) times daily.   Marland Kitchen escitalopram (LEXAPRO) 20 MG tablet Take 1 tablet (20 mg total) by mouth daily.  . ferrous sulfate 325 (65 FE) MG tablet Take 1 tablet (325 mg total) by mouth daily with breakfast.  . fluconazole (DIFLUCAN) 150 MG tablet Take  1 tablet (150 mg total) by mouth once a week. (Patient not taking: Reported on 09/22/2020)  . levothyroxine (SYNTHROID) 50 MCG tablet Take 1 tablet (50 mcg total) by mouth daily before breakfast.  . LORazepam (ATIVAN) 0.5 MG tablet Take 1 tablet (0.5 mg total) by mouth 3 (three) times daily.  Marland Kitchen lovastatin (MEVACOR) 40 MG tablet Take 1 tablet (40 mg total) by mouth at bedtime.  Marland Kitchen nystatin (MYCOSTATIN/NYSTOP) powder Apply 1 application topically 3 (three) times daily.  Marland Kitchen nystatin cream (MYCOSTATIN) Apply to affected area 2 times daily  . ondansetron (ZOFRAN) 4 MG tablet Take 1 tablet (4 mg total) by mouth every 6 (six) hours as needed for nausea. (Patient not taking: No sig reported)  . pantoprazole (PROTONIX) 40 MG tablet TAKE 1 TABLET DAILY  . risperiDONE (RISPERDAL) 0.25 MG tablet TAKE 1 TABLET IN THE MORNING AND 2 TABLETS AT BEDTIME (Patient taking differently: TAKE 1 TABLET IN THE MORNING AND 1 TABLETS AT BEDTIME)   No facility-administered encounter medications on file as of 11/10/2020.    Allergies as of 11/10/2020 - Review Complete 11/10/2020  Allergen Reaction Noted  . Keflex [cephalexin] Other (See Comments) 08/12/2020  . Doxycycline Rash 03/01/2018    Past Medical History:  Diagnosis Date  . Anxiety   . Depression   . GERD (gastroesophageal reflux disease)   . Hyperlipidemia   . Mental developmental delay   . OCD (obsessive compulsive disorder)   . Osteoporosis   .  Thyroid disease     Past Surgical History:  Procedure Laterality Date  . BIOPSY  11/02/2019   Procedure: BIOPSY;  Surgeon: Gatha Mayer, MD;  Location: [facility];  Service: Endoscopy;;  . COLONOSCOPY WITH PROPOFOL N/A 11/02/2019   Procedure: COLONOSCOPY WITH PROPOFOL;  Surgeon: Gatha Mayer, MD;  Location: [facility];  Service: Endoscopy;  Laterality: N/A;  . ESOPHAGOGASTRODUODENOSCOPY (EGD) WITH PROPOFOL N/A 11/01/2019   Procedure: ESOPHAGOGASTRODUODENOSCOPY (EGD) WITH PROPOFOL;  Surgeon: Gatha Mayer,  MD;  Location: [facility];  Service: Endoscopy;  Laterality: N/A;  . TONSILLECTOMY      Family History  Problem Relation Age of Onset  . Diabetes Father   . Hyperlipidemia Father   . Hypertension Father   . Atrial fibrillation Father   . Heart attack Father   . Osteoporosis Father   . Hyperlipidemia Sister   . Dysphagia Sister   . GI problems Mother   . Hypertension Mother   . Hyperlipidemia Mother   . Heart disease Maternal Grandfather   . Colon cancer Maternal Grandfather     Social History   Socioeconomic History  . Marital status: Single    Spouse name: Not on file  . Number of children: 0  . Years of education: 66  . Highest education level: 12th grade  Occupational History  . Occupation: retired    Comment: Special educational needs teacher  Tobacco Use  . Smoking status: Never Smoker  . Smokeless tobacco: Never Used  Vaping Use  . Vaping Use: Never used  Substance and Sexual Activity  . Alcohol use: No  . Drug use: No  . Sexual activity: Never  Other Topics Concern  . Not on file  Social History Narrative  . Not on file   Social Determinants of Health   Financial Resource Strain: Not on file  Food Insecurity: Not on file  Transportation Needs: Not on file  Physical Activity: Not on file  Stress: Not on file  Social Connections: Not on file  Intimate Partner Violence: Not on file    Review of Systems  Respiratory: Positive for cough and shortness of breath.     Vitals:   11/10/20 1119  BP: 130/80  Pulse: 70  Temp: 98 F (36.7 C)  SpO2: 97%     Physical Exam Constitutional:      Appearance: She is obese.  HENT:     Head: Normocephalic.     Nose: No congestion.     Mouth/Throat:     Mouth: Mucous membranes are moist.  Eyes:     General: No scleral icterus.    Pupils: Pupils are equal, round, and reactive to light.  Cardiovascular:     Rate and Rhythm: Normal rate and regular rhythm.     Heart sounds: No murmur heard. No friction rub.  Pulmonary:      Effort: No respiratory distress.     Breath sounds: No stridor. No wheezing or rhonchi.  Musculoskeletal:     Cervical back: No rigidity or tenderness.  Neurological:     Mental Status: She is alert.  Psychiatric:        Mood and Affect: Mood normal.    Data Reviewed: Echocardiogram 11/06/2020 shows normal ejection fraction, no significant valvular disease, right side of the heart was described as normal as well  Last chest x-ray on record was from 04/19/20-reviewed by myself showing no acute problems  Assessment:  .  Shortness of breath -Likely related to deconditioning, weight gain  .  Cough -Background history of reflux for which he is on Protonix -Usually sleeps with the head of bed elevated  Being evaluated for thyroid surgery   Plan/Recommendations: Obtain pulmonary function test  Obtain chest x-ray  Above testing do not preclude surgery  Graded exercise as tolerated  Weight loss efforts as able  Tentative follow-up in about 6 to 8 weeks     Sherrilyn Rist MD [facility] 11/10/2020, 11:27 AM  CC: Verl Blalock, NP

## 2020-11-10 NOTE — Patient Instructions (Signed)
Shortness of breath  Reason for shortness of breath is likely multifactorial -Deconditioning, weight gain  Cough likely related to reflux  No underlying history of lung disease, no underlying predisposition to lung disease  Obtain chest x-ray Obtain pulmonary function test  The above tests do not preclude going ahead with thyroid surgery as long as she is not having any acute problems

## 2020-11-11 DIAGNOSIS — R41841 Cognitive communication deficit: Secondary | ICD-10-CM | POA: Diagnosis not present

## 2020-11-11 DIAGNOSIS — M6281 Muscle weakness (generalized): Secondary | ICD-10-CM | POA: Diagnosis not present

## 2020-11-12 DIAGNOSIS — W19XXXD Unspecified fall, subsequent encounter: Secondary | ICD-10-CM | POA: Diagnosis not present

## 2020-11-12 DIAGNOSIS — Z01811 Encounter for preprocedural respiratory examination: Secondary | ICD-10-CM | POA: Diagnosis not present

## 2020-11-12 DIAGNOSIS — R0602 Shortness of breath: Secondary | ICD-10-CM | POA: Diagnosis not present

## 2020-11-12 DIAGNOSIS — Z8781 Personal history of (healed) traumatic fracture: Secondary | ICD-10-CM | POA: Diagnosis not present

## 2020-11-12 DIAGNOSIS — Z4789 Encounter for other orthopedic aftercare: Secondary | ICD-10-CM | POA: Diagnosis not present

## 2020-11-12 DIAGNOSIS — I509 Heart failure, unspecified: Secondary | ICD-10-CM | POA: Diagnosis not present

## 2020-11-12 DIAGNOSIS — R0600 Dyspnea, unspecified: Secondary | ICD-10-CM | POA: Diagnosis not present

## 2020-11-13 DIAGNOSIS — R41841 Cognitive communication deficit: Secondary | ICD-10-CM | POA: Diagnosis not present

## 2020-11-13 DIAGNOSIS — M6281 Muscle weakness (generalized): Secondary | ICD-10-CM | POA: Diagnosis not present

## 2020-11-14 DIAGNOSIS — M6281 Muscle weakness (generalized): Secondary | ICD-10-CM | POA: Diagnosis not present

## 2020-11-14 DIAGNOSIS — R41841 Cognitive communication deficit: Secondary | ICD-10-CM | POA: Diagnosis not present

## 2020-11-17 DIAGNOSIS — R41841 Cognitive communication deficit: Secondary | ICD-10-CM | POA: Diagnosis not present

## 2020-11-17 DIAGNOSIS — M6281 Muscle weakness (generalized): Secondary | ICD-10-CM | POA: Diagnosis not present

## 2020-11-18 DIAGNOSIS — M6281 Muscle weakness (generalized): Secondary | ICD-10-CM | POA: Diagnosis not present

## 2020-11-18 DIAGNOSIS — R41841 Cognitive communication deficit: Secondary | ICD-10-CM | POA: Diagnosis not present

## 2020-11-19 DIAGNOSIS — E039 Hypothyroidism, unspecified: Secondary | ICD-10-CM | POA: Diagnosis not present

## 2020-11-19 DIAGNOSIS — R0602 Shortness of breath: Secondary | ICD-10-CM | POA: Diagnosis not present

## 2020-11-19 DIAGNOSIS — R41841 Cognitive communication deficit: Secondary | ICD-10-CM | POA: Diagnosis not present

## 2020-11-19 DIAGNOSIS — K219 Gastro-esophageal reflux disease without esophagitis: Secondary | ICD-10-CM | POA: Diagnosis not present

## 2020-11-19 DIAGNOSIS — R0989 Other specified symptoms and signs involving the circulatory and respiratory systems: Secondary | ICD-10-CM | POA: Diagnosis not present

## 2020-11-19 DIAGNOSIS — I10 Essential (primary) hypertension: Secondary | ICD-10-CM | POA: Diagnosis not present

## 2020-11-19 DIAGNOSIS — M6281 Muscle weakness (generalized): Secondary | ICD-10-CM | POA: Diagnosis not present

## 2020-11-20 DIAGNOSIS — M6281 Muscle weakness (generalized): Secondary | ICD-10-CM | POA: Diagnosis not present

## 2020-11-20 DIAGNOSIS — R41841 Cognitive communication deficit: Secondary | ICD-10-CM | POA: Diagnosis not present

## 2020-11-25 DIAGNOSIS — Z23 Encounter for immunization: Secondary | ICD-10-CM | POA: Diagnosis not present

## 2020-11-26 DIAGNOSIS — R600 Localized edema: Secondary | ICD-10-CM | POA: Diagnosis not present

## 2020-11-26 DIAGNOSIS — I10 Essential (primary) hypertension: Secondary | ICD-10-CM | POA: Diagnosis not present

## 2020-11-26 DIAGNOSIS — E039 Hypothyroidism, unspecified: Secondary | ICD-10-CM | POA: Diagnosis not present

## 2020-11-26 DIAGNOSIS — C73 Malignant neoplasm of thyroid gland: Secondary | ICD-10-CM | POA: Diagnosis not present

## 2020-11-26 DIAGNOSIS — R5381 Other malaise: Secondary | ICD-10-CM | POA: Diagnosis not present

## 2020-11-26 DIAGNOSIS — D508 Other iron deficiency anemias: Secondary | ICD-10-CM | POA: Diagnosis not present

## 2020-11-26 DIAGNOSIS — K219 Gastro-esophageal reflux disease without esophagitis: Secondary | ICD-10-CM | POA: Diagnosis not present

## 2020-11-26 DIAGNOSIS — E785 Hyperlipidemia, unspecified: Secondary | ICD-10-CM | POA: Diagnosis not present

## 2020-11-26 DIAGNOSIS — F429 Obsessive-compulsive disorder, unspecified: Secondary | ICD-10-CM | POA: Diagnosis not present

## 2020-12-02 DIAGNOSIS — G20 Parkinson's disease: Secondary | ICD-10-CM | POA: Diagnosis not present

## 2020-12-02 DIAGNOSIS — S42354D Nondisplaced comminuted fracture of shaft of humerus, right arm, subsequent encounter for fracture with routine healing: Secondary | ICD-10-CM | POA: Diagnosis not present

## 2020-12-02 DIAGNOSIS — R1310 Dysphagia, unspecified: Secondary | ICD-10-CM | POA: Diagnosis not present

## 2020-12-02 DIAGNOSIS — R41841 Cognitive communication deficit: Secondary | ICD-10-CM | POA: Diagnosis not present

## 2020-12-03 DIAGNOSIS — G8929 Other chronic pain: Secondary | ICD-10-CM | POA: Diagnosis not present

## 2020-12-03 DIAGNOSIS — R1310 Dysphagia, unspecified: Secondary | ICD-10-CM | POA: Diagnosis not present

## 2020-12-03 DIAGNOSIS — E785 Hyperlipidemia, unspecified: Secondary | ICD-10-CM | POA: Diagnosis not present

## 2020-12-03 DIAGNOSIS — G20 Parkinson's disease: Secondary | ICD-10-CM | POA: Diagnosis not present

## 2020-12-03 DIAGNOSIS — E039 Hypothyroidism, unspecified: Secondary | ICD-10-CM | POA: Diagnosis not present

## 2020-12-03 DIAGNOSIS — R5381 Other malaise: Secondary | ICD-10-CM | POA: Diagnosis not present

## 2020-12-03 DIAGNOSIS — I10 Essential (primary) hypertension: Secondary | ICD-10-CM | POA: Diagnosis not present

## 2020-12-03 DIAGNOSIS — R41841 Cognitive communication deficit: Secondary | ICD-10-CM | POA: Diagnosis not present

## 2020-12-03 DIAGNOSIS — M25511 Pain in right shoulder: Secondary | ICD-10-CM | POA: Diagnosis not present

## 2020-12-03 DIAGNOSIS — K219 Gastro-esophageal reflux disease without esophagitis: Secondary | ICD-10-CM | POA: Diagnosis not present

## 2020-12-03 DIAGNOSIS — C73 Malignant neoplasm of thyroid gland: Secondary | ICD-10-CM | POA: Diagnosis not present

## 2020-12-03 DIAGNOSIS — R600 Localized edema: Secondary | ICD-10-CM | POA: Diagnosis not present

## 2020-12-03 DIAGNOSIS — D508 Other iron deficiency anemias: Secondary | ICD-10-CM | POA: Diagnosis not present

## 2020-12-03 DIAGNOSIS — S42354D Nondisplaced comminuted fracture of shaft of humerus, right arm, subsequent encounter for fracture with routine healing: Secondary | ICD-10-CM | POA: Diagnosis not present

## 2020-12-04 DIAGNOSIS — S42354D Nondisplaced comminuted fracture of shaft of humerus, right arm, subsequent encounter for fracture with routine healing: Secondary | ICD-10-CM | POA: Diagnosis not present

## 2020-12-04 DIAGNOSIS — R1310 Dysphagia, unspecified: Secondary | ICD-10-CM | POA: Diagnosis not present

## 2020-12-04 DIAGNOSIS — G20 Parkinson's disease: Secondary | ICD-10-CM | POA: Diagnosis not present

## 2020-12-04 DIAGNOSIS — R41841 Cognitive communication deficit: Secondary | ICD-10-CM | POA: Diagnosis not present

## 2020-12-05 DIAGNOSIS — R1310 Dysphagia, unspecified: Secondary | ICD-10-CM | POA: Diagnosis not present

## 2020-12-05 DIAGNOSIS — R41841 Cognitive communication deficit: Secondary | ICD-10-CM | POA: Diagnosis not present

## 2020-12-05 DIAGNOSIS — S42354D Nondisplaced comminuted fracture of shaft of humerus, right arm, subsequent encounter for fracture with routine healing: Secondary | ICD-10-CM | POA: Diagnosis not present

## 2020-12-05 DIAGNOSIS — G20 Parkinson's disease: Secondary | ICD-10-CM | POA: Diagnosis not present

## 2020-12-08 ENCOUNTER — Telehealth: Payer: Self-pay | Admitting: Pulmonary Disease

## 2020-12-08 DIAGNOSIS — R1310 Dysphagia, unspecified: Secondary | ICD-10-CM | POA: Diagnosis not present

## 2020-12-08 DIAGNOSIS — F445 Conversion disorder with seizures or convulsions: Secondary | ICD-10-CM | POA: Diagnosis not present

## 2020-12-08 DIAGNOSIS — G20 Parkinson's disease: Secondary | ICD-10-CM | POA: Diagnosis not present

## 2020-12-08 DIAGNOSIS — W19XXXD Unspecified fall, subsequent encounter: Secondary | ICD-10-CM | POA: Diagnosis not present

## 2020-12-08 DIAGNOSIS — F29 Unspecified psychosis not due to a substance or known physiological condition: Secondary | ICD-10-CM | POA: Diagnosis not present

## 2020-12-08 DIAGNOSIS — I509 Heart failure, unspecified: Secondary | ICD-10-CM | POA: Diagnosis not present

## 2020-12-08 DIAGNOSIS — R0600 Dyspnea, unspecified: Secondary | ICD-10-CM | POA: Diagnosis not present

## 2020-12-08 DIAGNOSIS — R41841 Cognitive communication deficit: Secondary | ICD-10-CM | POA: Diagnosis not present

## 2020-12-08 DIAGNOSIS — G8929 Other chronic pain: Secondary | ICD-10-CM | POA: Diagnosis not present

## 2020-12-08 DIAGNOSIS — S42354D Nondisplaced comminuted fracture of shaft of humerus, right arm, subsequent encounter for fracture with routine healing: Secondary | ICD-10-CM | POA: Diagnosis not present

## 2020-12-08 DIAGNOSIS — F321 Major depressive disorder, single episode, moderate: Secondary | ICD-10-CM | POA: Diagnosis not present

## 2020-12-08 MED ORDER — ALBUTEROL SULFATE HFA 108 (90 BASE) MCG/ACT IN AERS: 1.0000 | INHALATION_SPRAY | Freq: Four times a day (QID) | RESPIRATORY_TRACT | 5 refills | Status: DC | PRN

## 2020-12-08 NOTE — Telephone Encounter (Signed)
No   Can cancel

## 2020-12-08 NOTE — Telephone Encounter (Signed)
Spoke with Amy, aware of recs. Cancelled PFT. Also asked for albuterol hfa refill. This has been sent to pharmacy. Nothing further needed at this time- will close encounter.

## 2020-12-08 NOTE — Telephone Encounter (Signed)
Dr. Ander Slade please advise. Pt had Spirometry(PFT) completed on 11/12/2020 at a [place] office which can be seen in [place]. Does pt need to complete PFT ordered during last OV?

## 2020-12-09 DIAGNOSIS — R41841 Cognitive communication deficit: Secondary | ICD-10-CM | POA: Diagnosis not present

## 2020-12-09 DIAGNOSIS — G20 Parkinson's disease: Secondary | ICD-10-CM | POA: Diagnosis not present

## 2020-12-09 DIAGNOSIS — R1310 Dysphagia, unspecified: Secondary | ICD-10-CM | POA: Diagnosis not present

## 2020-12-09 DIAGNOSIS — S42354D Nondisplaced comminuted fracture of shaft of humerus, right arm, subsequent encounter for fracture with routine healing: Secondary | ICD-10-CM | POA: Diagnosis not present

## 2020-12-10 DIAGNOSIS — R1310 Dysphagia, unspecified: Secondary | ICD-10-CM | POA: Diagnosis not present

## 2020-12-10 DIAGNOSIS — S42354D Nondisplaced comminuted fracture of shaft of humerus, right arm, subsequent encounter for fracture with routine healing: Secondary | ICD-10-CM | POA: Diagnosis not present

## 2020-12-10 DIAGNOSIS — R41841 Cognitive communication deficit: Secondary | ICD-10-CM | POA: Diagnosis not present

## 2020-12-10 DIAGNOSIS — G20 Parkinson's disease: Secondary | ICD-10-CM | POA: Diagnosis not present

## 2020-12-11 DIAGNOSIS — G20 Parkinson's disease: Secondary | ICD-10-CM | POA: Diagnosis not present

## 2020-12-11 DIAGNOSIS — R1310 Dysphagia, unspecified: Secondary | ICD-10-CM | POA: Diagnosis not present

## 2020-12-11 DIAGNOSIS — R41841 Cognitive communication deficit: Secondary | ICD-10-CM | POA: Diagnosis not present

## 2020-12-11 DIAGNOSIS — S42354D Nondisplaced comminuted fracture of shaft of humerus, right arm, subsequent encounter for fracture with routine healing: Secondary | ICD-10-CM | POA: Diagnosis not present

## 2020-12-15 DIAGNOSIS — R1310 Dysphagia, unspecified: Secondary | ICD-10-CM | POA: Diagnosis not present

## 2020-12-15 DIAGNOSIS — R41841 Cognitive communication deficit: Secondary | ICD-10-CM | POA: Diagnosis not present

## 2020-12-15 DIAGNOSIS — G20 Parkinson's disease: Secondary | ICD-10-CM | POA: Diagnosis not present

## 2020-12-15 DIAGNOSIS — S42354D Nondisplaced comminuted fracture of shaft of humerus, right arm, subsequent encounter for fracture with routine healing: Secondary | ICD-10-CM | POA: Diagnosis not present

## 2020-12-17 DIAGNOSIS — R1310 Dysphagia, unspecified: Secondary | ICD-10-CM | POA: Diagnosis not present

## 2020-12-17 DIAGNOSIS — M25511 Pain in right shoulder: Secondary | ICD-10-CM | POA: Diagnosis not present

## 2020-12-17 DIAGNOSIS — R41841 Cognitive communication deficit: Secondary | ICD-10-CM | POA: Diagnosis not present

## 2020-12-17 DIAGNOSIS — G20 Parkinson's disease: Secondary | ICD-10-CM | POA: Diagnosis not present

## 2020-12-17 DIAGNOSIS — S42354D Nondisplaced comminuted fracture of shaft of humerus, right arm, subsequent encounter for fracture with routine healing: Secondary | ICD-10-CM | POA: Diagnosis not present

## 2020-12-18 DIAGNOSIS — G8929 Other chronic pain: Secondary | ICD-10-CM | POA: Diagnosis not present

## 2020-12-18 DIAGNOSIS — E785 Hyperlipidemia, unspecified: Secondary | ICD-10-CM | POA: Diagnosis not present

## 2020-12-18 DIAGNOSIS — K219 Gastro-esophageal reflux disease without esophagitis: Secondary | ICD-10-CM | POA: Diagnosis not present

## 2020-12-18 DIAGNOSIS — E039 Hypothyroidism, unspecified: Secondary | ICD-10-CM | POA: Diagnosis not present

## 2020-12-18 DIAGNOSIS — R5381 Other malaise: Secondary | ICD-10-CM | POA: Diagnosis not present

## 2020-12-18 DIAGNOSIS — R600 Localized edema: Secondary | ICD-10-CM | POA: Diagnosis not present

## 2020-12-18 DIAGNOSIS — I10 Essential (primary) hypertension: Secondary | ICD-10-CM | POA: Diagnosis not present

## 2020-12-18 DIAGNOSIS — R1310 Dysphagia, unspecified: Secondary | ICD-10-CM | POA: Diagnosis not present

## 2020-12-19 DIAGNOSIS — S42301A Unspecified fracture of shaft of humerus, right arm, initial encounter for closed fracture: Secondary | ICD-10-CM | POA: Diagnosis not present

## 2020-12-19 DIAGNOSIS — Z4789 Encounter for other orthopedic aftercare: Secondary | ICD-10-CM | POA: Diagnosis not present

## 2020-12-19 DIAGNOSIS — Z9889 Other specified postprocedural states: Secondary | ICD-10-CM | POA: Diagnosis not present

## 2020-12-22 ENCOUNTER — Ambulatory Visit: Payer: Medicare Other | Admitting: Neurology

## 2020-12-22 DIAGNOSIS — S42354D Nondisplaced comminuted fracture of shaft of humerus, right arm, subsequent encounter for fracture with routine healing: Secondary | ICD-10-CM | POA: Diagnosis not present

## 2020-12-22 DIAGNOSIS — G20 Parkinson's disease: Secondary | ICD-10-CM | POA: Diagnosis not present

## 2020-12-22 DIAGNOSIS — R1310 Dysphagia, unspecified: Secondary | ICD-10-CM | POA: Diagnosis not present

## 2020-12-22 DIAGNOSIS — R41841 Cognitive communication deficit: Secondary | ICD-10-CM | POA: Diagnosis not present

## 2020-12-23 DIAGNOSIS — G20 Parkinson's disease: Secondary | ICD-10-CM | POA: Diagnosis not present

## 2020-12-23 DIAGNOSIS — R1310 Dysphagia, unspecified: Secondary | ICD-10-CM | POA: Diagnosis not present

## 2020-12-23 DIAGNOSIS — R41841 Cognitive communication deficit: Secondary | ICD-10-CM | POA: Diagnosis not present

## 2020-12-23 DIAGNOSIS — S42354D Nondisplaced comminuted fracture of shaft of humerus, right arm, subsequent encounter for fracture with routine healing: Secondary | ICD-10-CM | POA: Diagnosis not present

## 2020-12-24 ENCOUNTER — Encounter: Payer: Self-pay | Admitting: Pulmonary Disease

## 2020-12-24 ENCOUNTER — Other Ambulatory Visit: Payer: Self-pay

## 2020-12-24 ENCOUNTER — Ambulatory Visit (INDEPENDENT_AMBULATORY_CARE_PROVIDER_SITE_OTHER): Payer: Medicare Other | Admitting: Pulmonary Disease

## 2020-12-24 VITALS — BP 122/74 | HR 61 | Temp 97.8°F | Ht 65.5 in | Wt 237.7 lb

## 2020-12-24 DIAGNOSIS — S42354D Nondisplaced comminuted fracture of shaft of humerus, right arm, subsequent encounter for fracture with routine healing: Secondary | ICD-10-CM | POA: Diagnosis not present

## 2020-12-24 DIAGNOSIS — R1310 Dysphagia, unspecified: Secondary | ICD-10-CM | POA: Diagnosis not present

## 2020-12-24 DIAGNOSIS — R059 Cough, unspecified: Secondary | ICD-10-CM

## 2020-12-24 DIAGNOSIS — R0602 Shortness of breath: Secondary | ICD-10-CM | POA: Diagnosis not present

## 2020-12-24 DIAGNOSIS — K219 Gastro-esophageal reflux disease without esophagitis: Secondary | ICD-10-CM | POA: Diagnosis not present

## 2020-12-24 DIAGNOSIS — G8929 Other chronic pain: Secondary | ICD-10-CM | POA: Diagnosis not present

## 2020-12-24 DIAGNOSIS — R600 Localized edema: Secondary | ICD-10-CM | POA: Diagnosis not present

## 2020-12-24 DIAGNOSIS — E039 Hypothyroidism, unspecified: Secondary | ICD-10-CM | POA: Diagnosis not present

## 2020-12-24 DIAGNOSIS — M25511 Pain in right shoulder: Secondary | ICD-10-CM | POA: Diagnosis not present

## 2020-12-24 DIAGNOSIS — I10 Essential (primary) hypertension: Secondary | ICD-10-CM | POA: Diagnosis not present

## 2020-12-24 DIAGNOSIS — C73 Malignant neoplasm of thyroid gland: Secondary | ICD-10-CM | POA: Diagnosis not present

## 2020-12-24 DIAGNOSIS — G20 Parkinson's disease: Secondary | ICD-10-CM | POA: Diagnosis not present

## 2020-12-24 DIAGNOSIS — Z4789 Encounter for other orthopedic aftercare: Secondary | ICD-10-CM | POA: Diagnosis not present

## 2020-12-24 DIAGNOSIS — R41841 Cognitive communication deficit: Secondary | ICD-10-CM | POA: Diagnosis not present

## 2020-12-24 NOTE — Progress Notes (Signed)
Renee Davidson    [id number]    24-Aug-1950  Primary Care Physician:Patient, No Pcp Per (Inactive)  Referring Physician: No referring provider defined for this encounter.  Chief complaint:   Patient with cough, shortness of breath   HPI:  Recently evaluated for thyroid surgery Have any issues with for which she has follow-up  Has had a chronic cough Does have reflux for which she is on Protonix Cough sounded more congested today Has heard some wheezing as well  Spirometry performed recently shows no obstructive defect Chest x-ray from April-no acute infiltrative process  She feels relatively fine  Shortness of breath is chronic  Has had multiple falls recently, has gained over 30 pounds in the last couple years  Uses a walker for ambulation  She is not acutely ill  Recently had echocardiogram showing normal ejection fraction  At some point was on oxygen supplementation Was given albuterol at some point  Resided in a nursing facility and currently assisted living following falls  Outpatient Encounter Medications as of 12/24/2020  Medication Sig  . acetaminophen (TYLENOL) 325 MG tablet Take 650 mg by mouth every 6 (six) hours as needed.  Marland Kitchen albuterol (VENTOLIN HFA) 108 (90 Base) MCG/ACT inhaler Inhale 1-2 puffs into the lungs every 6 (six) hours as needed for wheezing or shortness of breath.  Marland Kitchen ascorbic acid (VITAMIN C) 500 MG tablet Take 1 tablet (500 mg total) by mouth daily.  Marland Kitchen aspirin 325 MG tablet Take 325 mg by mouth daily.  . benzonatate (TESSALON PERLES) 100 MG capsule Take 1 capsule (100 mg total) by mouth 3 (three) times daily as needed for cough. (Patient not taking: Reported on 11/10/2020)  . Calcium-Vitamin D-Vitamin K 500-100-40 MG-UNT-MCG CHEW Chew 1 tablet by mouth 2 (two) times daily.  (Patient not taking: Reported on 11/10/2020)  . dextromethorphan-guaiFENesin (MUCINEX DM) 30-600 MG 12hr tablet Take 1 tablet by mouth 2 (two) times daily.  Marland Kitchen  escitalopram (LEXAPRO) 20 MG tablet Take 1 tablet (20 mg total) by mouth daily.  . ferrous sulfate 325 (65 FE) MG tablet Take 1 tablet (325 mg total) by mouth daily with breakfast.  . fluconazole (DIFLUCAN) 150 MG tablet Take 1 tablet (150 mg total) by mouth once a week. (Patient not taking: Reported on 11/10/2020)  . levothyroxine (SYNTHROID) 50 MCG tablet Take 1 tablet (50 mcg total) by mouth daily before breakfast.  . LORazepam (ATIVAN) 0.5 MG tablet Take 1 tablet (0.5 mg total) by mouth 3 (three) times daily. (Patient taking differently: Take 0.5 mg by mouth 2 (two) times daily.)  . LORazepam (ATIVAN) 2 MG/ML injection   . lovastatin (MEVACOR) 40 MG tablet Take 1 tablet (40 mg total) by mouth at bedtime.  Marland Kitchen nystatin (MYCOSTATIN/NYSTOP) powder Apply 1 application topically 3 (three) times daily.  Marland Kitchen nystatin cream (MYCOSTATIN) Apply to affected area 2 times daily (Patient not taking: Reported on 11/10/2020)  . OLANZapine (ZYPREXA) 2.5 MG tablet Take 2.5 mg by mouth at bedtime.  . ondansetron (ZOFRAN) 4 MG tablet Take 1 tablet (4 mg total) by mouth every 6 (six) hours as needed for nausea. (Patient not taking: Reported on 11/10/2020)  . pantoprazole (PROTONIX) 40 MG tablet TAKE 1 TABLET DAILY  . polyvinyl alcohol (LIQUIFILM TEARS) 1.4 % ophthalmic solution 1 drop as needed for dry eyes.  Marland Kitchen risperiDONE (RISPERDAL) 0.25 MG tablet TAKE 1 TABLET IN THE MORNING AND 2 TABLETS AT BEDTIME (Patient taking differently: TAKE 1 TABLET IN THE  MORNING AND 1 TABLETS AT BEDTIME)   No facility-administered encounter medications on file as of 12/24/2020.    Allergies as of 12/24/2020 - Review Complete 12/24/2020  Allergen Reaction Noted  . Other Other (See Comments) 08/12/2020  . Keflex [cephalexin] Other (See Comments) 08/12/2020  . Doxycycline Rash 03/01/2018    Past Medical History:  Diagnosis Date  . Anxiety   . Depression   . GERD (gastroesophageal reflux disease)   . Hyperlipidemia   . Mental  developmental delay   . OCD (obsessive compulsive disorder)   . Osteoporosis   . Thyroid disease     Past Surgical History:  Procedure Laterality Date  . BIOPSY  11/02/2019   Procedure: BIOPSY;  Surgeon: Gatha Mayer, MD;  Location: [facility];  Service: Endoscopy;;  . COLONOSCOPY WITH PROPOFOL N/A 11/02/2019   Procedure: COLONOSCOPY WITH PROPOFOL;  Surgeon: Gatha Mayer, MD;  Location: [facility];  Service: Endoscopy;  Laterality: N/A;  . ESOPHAGOGASTRODUODENOSCOPY (EGD) WITH PROPOFOL N/A 11/01/2019   Procedure: ESOPHAGOGASTRODUODENOSCOPY (EGD) WITH PROPOFOL;  Surgeon: Gatha Mayer, MD;  Location: [facility];  Service: Endoscopy;  Laterality: N/A;  . TONSILLECTOMY      Family History  Problem Relation Age of Onset  . Diabetes Father   . Hyperlipidemia Father   . Hypertension Father   . Atrial fibrillation Father   . Heart attack Father   . Osteoporosis Father   . Hyperlipidemia Sister   . Dysphagia Sister   . GI problems Mother   . Hypertension Mother   . Hyperlipidemia Mother   . Heart disease Maternal Grandfather   . Colon cancer Maternal Grandfather     Social History   Socioeconomic History  . Marital status: Single    Spouse name: Not on file  . Number of children: 0  . Years of education: 30  . Highest education level: 12th grade  Occupational History  . Occupation: retired    Comment: Special educational needs teacher  Tobacco Use  . Smoking status: Never Smoker  . Smokeless tobacco: Never Used  Vaping Use  . Vaping Use: Never used  Substance and Sexual Activity  . Alcohol use: No  . Drug use: No  . Sexual activity: Never  Other Topics Concern  . Not on file  Social History Narrative  . Not on file   Social Determinants of Health   Financial Resource Strain: Not on file  Food Insecurity: Not on file  Transportation Needs: Not on file  Physical Activity: Not on file  Stress: Not on file  Social Connections: Not on file  Intimate Partner Violence: Not on  file    Review of Systems  Constitutional: Negative for fatigue.  Respiratory: Positive for cough, shortness of breath and wheezing.     Vitals:   12/24/20 1153  BP: 122/74  Pulse: 61  Temp: 97.8 F (36.6 C)  SpO2: 95%     Physical Exam Constitutional:      Appearance: She is obese.  HENT:     Head: Normocephalic.  Eyes:     General: No scleral icterus. Cardiovascular:     Rate and Rhythm: Normal rate and regular rhythm.     Heart sounds: No murmur heard. No friction rub.  Pulmonary:     Effort: No respiratory distress.     Breath sounds: No stridor. No wheezing or rhonchi.  Musculoskeletal:     Cervical back: No rigidity or tenderness.  Neurological:     Mental Status: She is alert.  Psychiatric:        Mood and Affect: Mood normal.    Data Reviewed: Echocardiogram 11/06/2020 shows normal ejection fraction, no significant valvular disease, right side of the heart was described as normal as well  Chest x-ray 11/10/2020 shows normal infiltrative process  Spirometry reviewed from care everywhere 11/12/2020 shows no obstruction  Assessment:  .  Shortness of breath -Multifactorial -Deconditioning, weight gain  .  Cough -Likely multifactorial -Related to reflux -Sounded wet today and I did inquire whether she feels under the weather which she does not   Plan/Recommendations:  Encouraged to use albuterol as needed  Possibility of using other inhaler that might help with wheezing and shortness of breath discussed-can be considered to use Symbicort as needed  Encourage graded exercises as tolerated  I will see her as needed  Encouraged to call with any significant concerns    Sherrilyn Rist MD [facility] 12/24/2020, 12:03 PM  CC: No ref. provider found

## 2020-12-24 NOTE — Patient Instructions (Signed)
Your last chest x-ray was within normal limits  Breathing study that was on the computer was relatively normal  Your last echocardiogram also noted to be within normal limits  If any significant changes with your cough and wheezing and you want to try a different inhaler, to give [place] a call   Request for albuterol whenever you are having shortness of breath or wheezing  I will see you as needed

## 2020-12-25 DIAGNOSIS — R41841 Cognitive communication deficit: Secondary | ICD-10-CM | POA: Diagnosis not present

## 2020-12-25 DIAGNOSIS — G20 Parkinson's disease: Secondary | ICD-10-CM | POA: Diagnosis not present

## 2020-12-25 DIAGNOSIS — S42354D Nondisplaced comminuted fracture of shaft of humerus, right arm, subsequent encounter for fracture with routine healing: Secondary | ICD-10-CM | POA: Diagnosis not present

## 2020-12-25 DIAGNOSIS — R1310 Dysphagia, unspecified: Secondary | ICD-10-CM | POA: Diagnosis not present

## 2020-12-26 DIAGNOSIS — G20 Parkinson's disease: Secondary | ICD-10-CM | POA: Diagnosis not present

## 2020-12-26 DIAGNOSIS — R41841 Cognitive communication deficit: Secondary | ICD-10-CM | POA: Diagnosis not present

## 2020-12-26 DIAGNOSIS — R1310 Dysphagia, unspecified: Secondary | ICD-10-CM | POA: Diagnosis not present

## 2020-12-26 DIAGNOSIS — S42354D Nondisplaced comminuted fracture of shaft of humerus, right arm, subsequent encounter for fracture with routine healing: Secondary | ICD-10-CM | POA: Diagnosis not present

## 2020-12-29 DIAGNOSIS — S42354D Nondisplaced comminuted fracture of shaft of humerus, right arm, subsequent encounter for fracture with routine healing: Secondary | ICD-10-CM | POA: Diagnosis not present

## 2020-12-29 DIAGNOSIS — G20 Parkinson's disease: Secondary | ICD-10-CM | POA: Diagnosis not present

## 2020-12-29 DIAGNOSIS — R41841 Cognitive communication deficit: Secondary | ICD-10-CM | POA: Diagnosis not present

## 2020-12-29 DIAGNOSIS — R1310 Dysphagia, unspecified: Secondary | ICD-10-CM | POA: Diagnosis not present

## 2020-12-30 DIAGNOSIS — R41841 Cognitive communication deficit: Secondary | ICD-10-CM | POA: Diagnosis not present

## 2020-12-30 DIAGNOSIS — S42354D Nondisplaced comminuted fracture of shaft of humerus, right arm, subsequent encounter for fracture with routine healing: Secondary | ICD-10-CM | POA: Diagnosis not present

## 2020-12-30 DIAGNOSIS — R1310 Dysphagia, unspecified: Secondary | ICD-10-CM | POA: Diagnosis not present

## 2020-12-30 DIAGNOSIS — G20 Parkinson's disease: Secondary | ICD-10-CM | POA: Diagnosis not present

## 2021-01-07 DIAGNOSIS — Z4789 Encounter for other orthopedic aftercare: Secondary | ICD-10-CM | POA: Diagnosis not present

## 2021-01-07 DIAGNOSIS — Z8781 Personal history of (healed) traumatic fracture: Secondary | ICD-10-CM | POA: Diagnosis not present

## 2021-01-07 DIAGNOSIS — S42301D Unspecified fracture of shaft of humerus, right arm, subsequent encounter for fracture with routine healing: Secondary | ICD-10-CM | POA: Diagnosis not present

## 2021-01-09 DIAGNOSIS — M79621 Pain in right upper arm: Secondary | ICD-10-CM | POA: Diagnosis not present

## 2021-01-12 DIAGNOSIS — K219 Gastro-esophageal reflux disease without esophagitis: Secondary | ICD-10-CM | POA: Diagnosis not present

## 2021-01-12 DIAGNOSIS — M25511 Pain in right shoulder: Secondary | ICD-10-CM | POA: Diagnosis not present

## 2021-01-12 DIAGNOSIS — R600 Localized edema: Secondary | ICD-10-CM | POA: Diagnosis not present

## 2021-01-12 DIAGNOSIS — I10 Essential (primary) hypertension: Secondary | ICD-10-CM | POA: Diagnosis not present

## 2021-01-12 DIAGNOSIS — R059 Cough, unspecified: Secondary | ICD-10-CM | POA: Diagnosis not present

## 2021-01-12 DIAGNOSIS — R5381 Other malaise: Secondary | ICD-10-CM | POA: Diagnosis not present

## 2021-01-12 DIAGNOSIS — R1310 Dysphagia, unspecified: Secondary | ICD-10-CM | POA: Diagnosis not present

## 2021-01-12 DIAGNOSIS — E039 Hypothyroidism, unspecified: Secondary | ICD-10-CM | POA: Diagnosis not present

## 2021-01-13 DIAGNOSIS — R601 Generalized edema: Secondary | ICD-10-CM | POA: Diagnosis not present

## 2021-01-14 DIAGNOSIS — D508 Other iron deficiency anemias: Secondary | ICD-10-CM | POA: Diagnosis not present

## 2021-01-14 DIAGNOSIS — G8929 Other chronic pain: Secondary | ICD-10-CM | POA: Diagnosis not present

## 2021-01-14 DIAGNOSIS — R233 Spontaneous ecchymoses: Secondary | ICD-10-CM | POA: Diagnosis not present

## 2021-01-14 DIAGNOSIS — C73 Malignant neoplasm of thyroid gland: Secondary | ICD-10-CM | POA: Diagnosis not present

## 2021-01-14 DIAGNOSIS — E785 Hyperlipidemia, unspecified: Secondary | ICD-10-CM | POA: Diagnosis not present

## 2021-01-14 DIAGNOSIS — K219 Gastro-esophageal reflux disease without esophagitis: Secondary | ICD-10-CM | POA: Diagnosis not present

## 2021-01-14 DIAGNOSIS — L0390 Cellulitis, unspecified: Secondary | ICD-10-CM | POA: Diagnosis not present

## 2021-01-14 DIAGNOSIS — R600 Localized edema: Secondary | ICD-10-CM | POA: Diagnosis not present

## 2021-01-14 DIAGNOSIS — R0600 Dyspnea, unspecified: Secondary | ICD-10-CM | POA: Diagnosis not present

## 2021-01-14 DIAGNOSIS — M25511 Pain in right shoulder: Secondary | ICD-10-CM | POA: Diagnosis not present

## 2021-01-14 DIAGNOSIS — R1310 Dysphagia, unspecified: Secondary | ICD-10-CM | POA: Diagnosis not present

## 2021-01-14 DIAGNOSIS — I471 Supraventricular tachycardia: Secondary | ICD-10-CM | POA: Diagnosis not present

## 2021-01-15 DIAGNOSIS — K219 Gastro-esophageal reflux disease without esophagitis: Secondary | ICD-10-CM | POA: Diagnosis not present

## 2021-01-15 DIAGNOSIS — G20 Parkinson's disease: Secondary | ICD-10-CM | POA: Diagnosis not present

## 2021-01-15 DIAGNOSIS — S42354D Nondisplaced comminuted fracture of shaft of humerus, right arm, subsequent encounter for fracture with routine healing: Secondary | ICD-10-CM | POA: Diagnosis not present

## 2021-01-15 DIAGNOSIS — E785 Hyperlipidemia, unspecified: Secondary | ICD-10-CM | POA: Diagnosis not present

## 2021-01-15 DIAGNOSIS — R600 Localized edema: Secondary | ICD-10-CM | POA: Diagnosis not present

## 2021-01-15 DIAGNOSIS — D508 Other iron deficiency anemias: Secondary | ICD-10-CM | POA: Diagnosis not present

## 2021-01-15 DIAGNOSIS — I10 Essential (primary) hypertension: Secondary | ICD-10-CM | POA: Diagnosis not present

## 2021-01-15 DIAGNOSIS — R233 Spontaneous ecchymoses: Secondary | ICD-10-CM | POA: Diagnosis not present

## 2021-01-15 DIAGNOSIS — R41841 Cognitive communication deficit: Secondary | ICD-10-CM | POA: Diagnosis not present

## 2021-01-15 DIAGNOSIS — G8929 Other chronic pain: Secondary | ICD-10-CM | POA: Diagnosis not present

## 2021-01-15 DIAGNOSIS — E039 Hypothyroidism, unspecified: Secondary | ICD-10-CM | POA: Diagnosis not present

## 2021-01-15 DIAGNOSIS — R5381 Other malaise: Secondary | ICD-10-CM | POA: Diagnosis not present

## 2021-01-15 DIAGNOSIS — R1310 Dysphagia, unspecified: Secondary | ICD-10-CM | POA: Diagnosis not present

## 2021-01-15 DIAGNOSIS — L0390 Cellulitis, unspecified: Secondary | ICD-10-CM | POA: Diagnosis not present

## 2021-01-15 DIAGNOSIS — F321 Major depressive disorder, single episode, moderate: Secondary | ICD-10-CM | POA: Diagnosis not present

## 2021-01-15 DIAGNOSIS — F29 Unspecified psychosis not due to a substance or known physiological condition: Secondary | ICD-10-CM | POA: Diagnosis not present

## 2021-01-15 DIAGNOSIS — F445 Conversion disorder with seizures or convulsions: Secondary | ICD-10-CM | POA: Diagnosis not present

## 2021-01-16 DIAGNOSIS — R1310 Dysphagia, unspecified: Secondary | ICD-10-CM | POA: Diagnosis not present

## 2021-01-16 DIAGNOSIS — G20 Parkinson's disease: Secondary | ICD-10-CM | POA: Diagnosis not present

## 2021-01-16 DIAGNOSIS — R41841 Cognitive communication deficit: Secondary | ICD-10-CM | POA: Diagnosis not present

## 2021-01-16 DIAGNOSIS — S42354D Nondisplaced comminuted fracture of shaft of humerus, right arm, subsequent encounter for fracture with routine healing: Secondary | ICD-10-CM | POA: Diagnosis not present

## 2021-01-16 DIAGNOSIS — E039 Hypothyroidism, unspecified: Secondary | ICD-10-CM | POA: Diagnosis not present

## 2021-01-17 DIAGNOSIS — E039 Hypothyroidism, unspecified: Secondary | ICD-10-CM | POA: Diagnosis not present

## 2021-01-19 DIAGNOSIS — R41841 Cognitive communication deficit: Secondary | ICD-10-CM | POA: Diagnosis not present

## 2021-01-19 DIAGNOSIS — K219 Gastro-esophageal reflux disease without esophagitis: Secondary | ICD-10-CM | POA: Diagnosis not present

## 2021-01-19 DIAGNOSIS — R600 Localized edema: Secondary | ICD-10-CM | POA: Diagnosis not present

## 2021-01-19 DIAGNOSIS — R233 Spontaneous ecchymoses: Secondary | ICD-10-CM | POA: Diagnosis not present

## 2021-01-19 DIAGNOSIS — R5381 Other malaise: Secondary | ICD-10-CM | POA: Diagnosis not present

## 2021-01-19 DIAGNOSIS — E785 Hyperlipidemia, unspecified: Secondary | ICD-10-CM | POA: Diagnosis not present

## 2021-01-19 DIAGNOSIS — S42354D Nondisplaced comminuted fracture of shaft of humerus, right arm, subsequent encounter for fracture with routine healing: Secondary | ICD-10-CM | POA: Diagnosis not present

## 2021-01-19 DIAGNOSIS — D508 Other iron deficiency anemias: Secondary | ICD-10-CM | POA: Diagnosis not present

## 2021-01-19 DIAGNOSIS — I10 Essential (primary) hypertension: Secondary | ICD-10-CM | POA: Diagnosis not present

## 2021-01-19 DIAGNOSIS — G20 Parkinson's disease: Secondary | ICD-10-CM | POA: Diagnosis not present

## 2021-01-19 DIAGNOSIS — C73 Malignant neoplasm of thyroid gland: Secondary | ICD-10-CM | POA: Diagnosis not present

## 2021-01-19 DIAGNOSIS — E039 Hypothyroidism, unspecified: Secondary | ICD-10-CM | POA: Diagnosis not present

## 2021-01-19 DIAGNOSIS — M109 Gout, unspecified: Secondary | ICD-10-CM | POA: Diagnosis not present

## 2021-01-19 DIAGNOSIS — L0390 Cellulitis, unspecified: Secondary | ICD-10-CM | POA: Diagnosis not present

## 2021-01-19 DIAGNOSIS — R1310 Dysphagia, unspecified: Secondary | ICD-10-CM | POA: Diagnosis not present

## 2021-01-19 DIAGNOSIS — G8929 Other chronic pain: Secondary | ICD-10-CM | POA: Diagnosis not present

## 2021-01-20 DIAGNOSIS — R41841 Cognitive communication deficit: Secondary | ICD-10-CM | POA: Diagnosis not present

## 2021-01-20 DIAGNOSIS — R1310 Dysphagia, unspecified: Secondary | ICD-10-CM | POA: Diagnosis not present

## 2021-01-20 DIAGNOSIS — G20 Parkinson's disease: Secondary | ICD-10-CM | POA: Diagnosis not present

## 2021-01-20 DIAGNOSIS — S42354D Nondisplaced comminuted fracture of shaft of humerus, right arm, subsequent encounter for fracture with routine healing: Secondary | ICD-10-CM | POA: Diagnosis not present

## 2021-01-21 ENCOUNTER — Other Ambulatory Visit (HOSPITAL_COMMUNITY): Payer: Self-pay

## 2021-01-21 DIAGNOSIS — R1310 Dysphagia, unspecified: Secondary | ICD-10-CM | POA: Diagnosis not present

## 2021-01-21 DIAGNOSIS — S42354D Nondisplaced comminuted fracture of shaft of humerus, right arm, subsequent encounter for fracture with routine healing: Secondary | ICD-10-CM | POA: Diagnosis not present

## 2021-01-21 DIAGNOSIS — R233 Spontaneous ecchymoses: Secondary | ICD-10-CM | POA: Diagnosis not present

## 2021-01-21 DIAGNOSIS — D508 Other iron deficiency anemias: Secondary | ICD-10-CM | POA: Diagnosis not present

## 2021-01-21 DIAGNOSIS — C73 Malignant neoplasm of thyroid gland: Secondary | ICD-10-CM | POA: Diagnosis not present

## 2021-01-21 DIAGNOSIS — F429 Obsessive-compulsive disorder, unspecified: Secondary | ICD-10-CM | POA: Diagnosis not present

## 2021-01-21 DIAGNOSIS — E039 Hypothyroidism, unspecified: Secondary | ICD-10-CM | POA: Diagnosis not present

## 2021-01-21 DIAGNOSIS — E785 Hyperlipidemia, unspecified: Secondary | ICD-10-CM | POA: Diagnosis not present

## 2021-01-21 DIAGNOSIS — R059 Cough, unspecified: Secondary | ICD-10-CM

## 2021-01-21 DIAGNOSIS — M109 Gout, unspecified: Secondary | ICD-10-CM | POA: Diagnosis not present

## 2021-01-21 DIAGNOSIS — I10 Essential (primary) hypertension: Secondary | ICD-10-CM | POA: Diagnosis not present

## 2021-01-21 DIAGNOSIS — L0390 Cellulitis, unspecified: Secondary | ICD-10-CM | POA: Diagnosis not present

## 2021-01-21 DIAGNOSIS — M25511 Pain in right shoulder: Secondary | ICD-10-CM | POA: Diagnosis not present

## 2021-01-21 DIAGNOSIS — R41841 Cognitive communication deficit: Secondary | ICD-10-CM | POA: Diagnosis not present

## 2021-01-21 DIAGNOSIS — G20 Parkinson's disease: Secondary | ICD-10-CM | POA: Diagnosis not present

## 2021-01-21 DIAGNOSIS — K219 Gastro-esophageal reflux disease without esophagitis: Secondary | ICD-10-CM | POA: Diagnosis not present

## 2021-01-21 DIAGNOSIS — R5381 Other malaise: Secondary | ICD-10-CM | POA: Diagnosis not present

## 2021-01-22 DIAGNOSIS — S42354D Nondisplaced comminuted fracture of shaft of humerus, right arm, subsequent encounter for fracture with routine healing: Secondary | ICD-10-CM | POA: Diagnosis not present

## 2021-01-22 DIAGNOSIS — R41841 Cognitive communication deficit: Secondary | ICD-10-CM | POA: Diagnosis not present

## 2021-01-22 DIAGNOSIS — G20 Parkinson's disease: Secondary | ICD-10-CM | POA: Diagnosis not present

## 2021-01-22 DIAGNOSIS — R1310 Dysphagia, unspecified: Secondary | ICD-10-CM | POA: Diagnosis not present

## 2021-01-26 DIAGNOSIS — R233 Spontaneous ecchymoses: Secondary | ICD-10-CM | POA: Diagnosis not present

## 2021-01-26 DIAGNOSIS — R1310 Dysphagia, unspecified: Secondary | ICD-10-CM | POA: Diagnosis not present

## 2021-01-26 DIAGNOSIS — M109 Gout, unspecified: Secondary | ICD-10-CM | POA: Diagnosis not present

## 2021-01-26 DIAGNOSIS — E785 Hyperlipidemia, unspecified: Secondary | ICD-10-CM | POA: Diagnosis not present

## 2021-01-26 DIAGNOSIS — G20 Parkinson's disease: Secondary | ICD-10-CM | POA: Diagnosis not present

## 2021-01-26 DIAGNOSIS — K219 Gastro-esophageal reflux disease without esophagitis: Secondary | ICD-10-CM | POA: Diagnosis not present

## 2021-01-26 DIAGNOSIS — G8929 Other chronic pain: Secondary | ICD-10-CM | POA: Diagnosis not present

## 2021-01-26 DIAGNOSIS — R41841 Cognitive communication deficit: Secondary | ICD-10-CM | POA: Diagnosis not present

## 2021-01-26 DIAGNOSIS — S42301D Unspecified fracture of shaft of humerus, right arm, subsequent encounter for fracture with routine healing: Secondary | ICD-10-CM | POA: Diagnosis not present

## 2021-01-26 DIAGNOSIS — D508 Other iron deficiency anemias: Secondary | ICD-10-CM | POA: Diagnosis not present

## 2021-01-26 DIAGNOSIS — R600 Localized edema: Secondary | ICD-10-CM | POA: Diagnosis not present

## 2021-01-26 DIAGNOSIS — S42354D Nondisplaced comminuted fracture of shaft of humerus, right arm, subsequent encounter for fracture with routine healing: Secondary | ICD-10-CM | POA: Diagnosis not present

## 2021-01-26 DIAGNOSIS — M79672 Pain in left foot: Secondary | ICD-10-CM | POA: Diagnosis not present

## 2021-01-26 DIAGNOSIS — M25511 Pain in right shoulder: Secondary | ICD-10-CM | POA: Diagnosis not present

## 2021-01-26 DIAGNOSIS — M25572 Pain in left ankle and joints of left foot: Secondary | ICD-10-CM | POA: Diagnosis not present

## 2021-01-26 DIAGNOSIS — M79662 Pain in left lower leg: Secondary | ICD-10-CM | POA: Diagnosis not present

## 2021-01-27 ENCOUNTER — Ambulatory Visit (HOSPITAL_COMMUNITY)
Admission: RE | Admit: 2021-01-27 | Discharge: 2021-01-27 | Disposition: A | Discharge status: Home / Self Care | Payer: Medicare Other | Source: Ambulatory Visit | Attending: Internal Medicine | Admitting: Internal Medicine

## 2021-01-27 ENCOUNTER — Other Ambulatory Visit: Payer: Self-pay

## 2021-01-27 DIAGNOSIS — R1310 Dysphagia, unspecified: Secondary | ICD-10-CM | POA: Diagnosis not present

## 2021-01-27 DIAGNOSIS — R059 Cough, unspecified: Secondary | ICD-10-CM

## 2021-01-27 DIAGNOSIS — Z9181 History of falling: Secondary | ICD-10-CM | POA: Diagnosis not present

## 2021-01-27 DIAGNOSIS — G20 Parkinson's disease: Secondary | ICD-10-CM | POA: Diagnosis not present

## 2021-01-27 DIAGNOSIS — S42354D Nondisplaced comminuted fracture of shaft of humerus, right arm, subsequent encounter for fracture with routine healing: Secondary | ICD-10-CM | POA: Diagnosis not present

## 2021-01-27 DIAGNOSIS — R262 Difficulty in walking, not elsewhere classified: Secondary | ICD-10-CM | POA: Diagnosis not present

## 2021-01-27 DIAGNOSIS — R41841 Cognitive communication deficit: Secondary | ICD-10-CM | POA: Diagnosis not present

## 2021-01-27 NOTE — Progress Notes (Signed)
Modified Barium Swallow Progress Note  Patient Details  Name: ARIANNIE PENALOZA MRN: [id number] Date of Birth: [birth year]  Today's Date: 01/27/2021  Modified Barium Swallow completed.  Full report located under Chart Review in the Imaging Section.  Brief recommendations include the following:  Clinical Impression  Pt demonstrates normal oral and oropharyngeal swallow function. No aspiration or residue. Esophageal sweep with puree and pudding unrevealing, but likely pt suffering from some type of esophageal dysphagia. Esophagram may be warranted.   Swallow Evaluation Recommendations       SLP Diet Recommendations: Regular solids;Thin liquid   Liquid Administration via: Cup;Straw   Medication Administration: Whole meds with liquid   Supervision: Patient able to self feed   Compensations: Follow solids with liquid                Devaughn Savant, Katherene Ponto 01/27/2021,1:03 PM

## 2021-01-28 DIAGNOSIS — R1310 Dysphagia, unspecified: Secondary | ICD-10-CM | POA: Diagnosis not present

## 2021-01-28 DIAGNOSIS — R41841 Cognitive communication deficit: Secondary | ICD-10-CM | POA: Diagnosis not present

## 2021-01-28 DIAGNOSIS — S42354D Nondisplaced comminuted fracture of shaft of humerus, right arm, subsequent encounter for fracture with routine healing: Secondary | ICD-10-CM | POA: Diagnosis not present

## 2021-01-28 DIAGNOSIS — G20 Parkinson's disease: Secondary | ICD-10-CM | POA: Diagnosis not present

## 2021-01-29 DIAGNOSIS — G20 Parkinson's disease: Secondary | ICD-10-CM | POA: Diagnosis not present

## 2021-01-29 DIAGNOSIS — R1310 Dysphagia, unspecified: Secondary | ICD-10-CM | POA: Diagnosis not present

## 2021-01-29 DIAGNOSIS — R41841 Cognitive communication deficit: Secondary | ICD-10-CM | POA: Diagnosis not present

## 2021-01-29 DIAGNOSIS — S42354D Nondisplaced comminuted fracture of shaft of humerus, right arm, subsequent encounter for fracture with routine healing: Secondary | ICD-10-CM | POA: Diagnosis not present

## 2021-01-30 ENCOUNTER — Other Ambulatory Visit: Payer: Self-pay | Admitting: Adult Health

## 2021-01-30 DIAGNOSIS — R1310 Dysphagia, unspecified: Secondary | ICD-10-CM | POA: Diagnosis not present

## 2021-01-30 DIAGNOSIS — S42354D Nondisplaced comminuted fracture of shaft of humerus, right arm, subsequent encounter for fracture with routine healing: Secondary | ICD-10-CM | POA: Diagnosis not present

## 2021-01-30 DIAGNOSIS — G20 Parkinson's disease: Secondary | ICD-10-CM | POA: Diagnosis not present

## 2021-01-30 DIAGNOSIS — M6281 Muscle weakness (generalized): Secondary | ICD-10-CM | POA: Diagnosis not present

## 2021-01-30 DIAGNOSIS — R41841 Cognitive communication deficit: Secondary | ICD-10-CM | POA: Diagnosis not present

## 2021-01-31 DIAGNOSIS — E039 Hypothyroidism, unspecified: Secondary | ICD-10-CM | POA: Diagnosis not present

## 2021-01-31 DIAGNOSIS — E119 Type 2 diabetes mellitus without complications: Secondary | ICD-10-CM | POA: Diagnosis not present

## 2021-01-31 DIAGNOSIS — E559 Vitamin D deficiency, unspecified: Secondary | ICD-10-CM | POA: Diagnosis not present

## 2021-02-02 DIAGNOSIS — R41841 Cognitive communication deficit: Secondary | ICD-10-CM | POA: Diagnosis not present

## 2021-02-02 DIAGNOSIS — S42354D Nondisplaced comminuted fracture of shaft of humerus, right arm, subsequent encounter for fracture with routine healing: Secondary | ICD-10-CM | POA: Diagnosis not present

## 2021-02-02 DIAGNOSIS — R1310 Dysphagia, unspecified: Secondary | ICD-10-CM | POA: Diagnosis not present

## 2021-02-02 DIAGNOSIS — G20 Parkinson's disease: Secondary | ICD-10-CM | POA: Diagnosis not present

## 2021-02-02 DIAGNOSIS — M6281 Muscle weakness (generalized): Secondary | ICD-10-CM | POA: Diagnosis not present

## 2021-02-03 DIAGNOSIS — Z8781 Personal history of (healed) traumatic fracture: Secondary | ICD-10-CM | POA: Diagnosis not present

## 2021-02-03 DIAGNOSIS — Z4789 Encounter for other orthopedic aftercare: Secondary | ICD-10-CM | POA: Diagnosis not present

## 2021-02-03 DIAGNOSIS — R41841 Cognitive communication deficit: Secondary | ICD-10-CM | POA: Diagnosis not present

## 2021-02-03 DIAGNOSIS — S42354D Nondisplaced comminuted fracture of shaft of humerus, right arm, subsequent encounter for fracture with routine healing: Secondary | ICD-10-CM | POA: Diagnosis not present

## 2021-02-03 DIAGNOSIS — M6281 Muscle weakness (generalized): Secondary | ICD-10-CM | POA: Diagnosis not present

## 2021-02-03 DIAGNOSIS — G20 Parkinson's disease: Secondary | ICD-10-CM | POA: Diagnosis not present

## 2021-02-03 DIAGNOSIS — R1310 Dysphagia, unspecified: Secondary | ICD-10-CM | POA: Diagnosis not present

## 2021-02-04 DIAGNOSIS — S42354D Nondisplaced comminuted fracture of shaft of humerus, right arm, subsequent encounter for fracture with routine healing: Secondary | ICD-10-CM | POA: Diagnosis not present

## 2021-02-04 DIAGNOSIS — M6281 Muscle weakness (generalized): Secondary | ICD-10-CM | POA: Diagnosis not present

## 2021-02-04 DIAGNOSIS — R1310 Dysphagia, unspecified: Secondary | ICD-10-CM | POA: Diagnosis not present

## 2021-02-04 DIAGNOSIS — R41841 Cognitive communication deficit: Secondary | ICD-10-CM | POA: Diagnosis not present

## 2021-02-04 DIAGNOSIS — G20 Parkinson's disease: Secondary | ICD-10-CM | POA: Diagnosis not present

## 2021-02-05 ENCOUNTER — Ambulatory Visit (HOSPITAL_COMMUNITY)
Admission: RE | Admit: 2021-02-05 | Discharge: 2021-02-05 | Disposition: A | Discharge status: Home / Self Care | Payer: Medicare Other | Source: Ambulatory Visit | Attending: Adult Health | Admitting: Adult Health

## 2021-02-05 ENCOUNTER — Other Ambulatory Visit: Payer: Self-pay

## 2021-02-05 DIAGNOSIS — K224 Dyskinesia of esophagus: Secondary | ICD-10-CM | POA: Diagnosis not present

## 2021-02-05 DIAGNOSIS — R1310 Dysphagia, unspecified: Secondary | ICD-10-CM | POA: Insufficient documentation

## 2021-02-05 DIAGNOSIS — R41841 Cognitive communication deficit: Secondary | ICD-10-CM | POA: Diagnosis not present

## 2021-02-05 DIAGNOSIS — M6281 Muscle weakness (generalized): Secondary | ICD-10-CM | POA: Diagnosis not present

## 2021-02-05 DIAGNOSIS — S42354D Nondisplaced comminuted fracture of shaft of humerus, right arm, subsequent encounter for fracture with routine healing: Secondary | ICD-10-CM | POA: Diagnosis not present

## 2021-02-05 DIAGNOSIS — G20 Parkinson's disease: Secondary | ICD-10-CM | POA: Diagnosis not present

## 2021-02-05 DIAGNOSIS — D529 Folate deficiency anemia, unspecified: Secondary | ICD-10-CM | POA: Diagnosis not present

## 2021-02-05 DIAGNOSIS — D509 Iron deficiency anemia, unspecified: Secondary | ICD-10-CM | POA: Diagnosis not present

## 2021-02-06 DIAGNOSIS — G20 Parkinson's disease: Secondary | ICD-10-CM | POA: Diagnosis not present

## 2021-02-06 DIAGNOSIS — R41841 Cognitive communication deficit: Secondary | ICD-10-CM | POA: Diagnosis not present

## 2021-02-06 DIAGNOSIS — R1310 Dysphagia, unspecified: Secondary | ICD-10-CM | POA: Diagnosis not present

## 2021-02-06 DIAGNOSIS — S42354D Nondisplaced comminuted fracture of shaft of humerus, right arm, subsequent encounter for fracture with routine healing: Secondary | ICD-10-CM | POA: Diagnosis not present

## 2021-02-06 DIAGNOSIS — M6281 Muscle weakness (generalized): Secondary | ICD-10-CM | POA: Diagnosis not present

## 2021-02-10 DIAGNOSIS — S42354D Nondisplaced comminuted fracture of shaft of humerus, right arm, subsequent encounter for fracture with routine healing: Secondary | ICD-10-CM | POA: Diagnosis not present

## 2021-02-10 DIAGNOSIS — R1310 Dysphagia, unspecified: Secondary | ICD-10-CM | POA: Diagnosis not present

## 2021-02-10 DIAGNOSIS — R41841 Cognitive communication deficit: Secondary | ICD-10-CM | POA: Diagnosis not present

## 2021-02-10 DIAGNOSIS — M6281 Muscle weakness (generalized): Secondary | ICD-10-CM | POA: Diagnosis not present

## 2021-02-10 DIAGNOSIS — G20 Parkinson's disease: Secondary | ICD-10-CM | POA: Diagnosis not present

## 2021-02-11 DIAGNOSIS — G20 Parkinson's disease: Secondary | ICD-10-CM | POA: Diagnosis not present

## 2021-02-11 DIAGNOSIS — S42354D Nondisplaced comminuted fracture of shaft of humerus, right arm, subsequent encounter for fracture with routine healing: Secondary | ICD-10-CM | POA: Diagnosis not present

## 2021-02-11 DIAGNOSIS — R1310 Dysphagia, unspecified: Secondary | ICD-10-CM | POA: Diagnosis not present

## 2021-02-11 DIAGNOSIS — R5381 Other malaise: Secondary | ICD-10-CM | POA: Diagnosis not present

## 2021-02-11 DIAGNOSIS — E039 Hypothyroidism, unspecified: Secondary | ICD-10-CM | POA: Diagnosis not present

## 2021-02-11 DIAGNOSIS — G8929 Other chronic pain: Secondary | ICD-10-CM | POA: Diagnosis not present

## 2021-02-11 DIAGNOSIS — K219 Gastro-esophageal reflux disease without esophagitis: Secondary | ICD-10-CM | POA: Diagnosis not present

## 2021-02-11 DIAGNOSIS — R233 Spontaneous ecchymoses: Secondary | ICD-10-CM | POA: Diagnosis not present

## 2021-02-11 DIAGNOSIS — E785 Hyperlipidemia, unspecified: Secondary | ICD-10-CM | POA: Diagnosis not present

## 2021-02-11 DIAGNOSIS — I10 Essential (primary) hypertension: Secondary | ICD-10-CM | POA: Diagnosis not present

## 2021-02-11 DIAGNOSIS — R41841 Cognitive communication deficit: Secondary | ICD-10-CM | POA: Diagnosis not present

## 2021-02-11 DIAGNOSIS — F429 Obsessive-compulsive disorder, unspecified: Secondary | ICD-10-CM | POA: Diagnosis not present

## 2021-02-11 DIAGNOSIS — S42301D Unspecified fracture of shaft of humerus, right arm, subsequent encounter for fracture with routine healing: Secondary | ICD-10-CM | POA: Diagnosis not present

## 2021-02-11 DIAGNOSIS — D508 Other iron deficiency anemias: Secondary | ICD-10-CM | POA: Diagnosis not present

## 2021-02-11 DIAGNOSIS — M109 Gout, unspecified: Secondary | ICD-10-CM | POA: Diagnosis not present

## 2021-02-11 DIAGNOSIS — M6281 Muscle weakness (generalized): Secondary | ICD-10-CM | POA: Diagnosis not present

## 2021-02-12 DIAGNOSIS — M6281 Muscle weakness (generalized): Secondary | ICD-10-CM | POA: Diagnosis not present

## 2021-02-12 DIAGNOSIS — G20 Parkinson's disease: Secondary | ICD-10-CM | POA: Diagnosis not present

## 2021-02-12 DIAGNOSIS — S42354D Nondisplaced comminuted fracture of shaft of humerus, right arm, subsequent encounter for fracture with routine healing: Secondary | ICD-10-CM | POA: Diagnosis not present

## 2021-02-12 DIAGNOSIS — R41841 Cognitive communication deficit: Secondary | ICD-10-CM | POA: Diagnosis not present

## 2021-02-12 DIAGNOSIS — R1310 Dysphagia, unspecified: Secondary | ICD-10-CM | POA: Diagnosis not present

## 2021-02-13 DIAGNOSIS — R41841 Cognitive communication deficit: Secondary | ICD-10-CM | POA: Diagnosis not present

## 2021-02-13 DIAGNOSIS — S42354D Nondisplaced comminuted fracture of shaft of humerus, right arm, subsequent encounter for fracture with routine healing: Secondary | ICD-10-CM | POA: Diagnosis not present

## 2021-02-13 DIAGNOSIS — G20 Parkinson's disease: Secondary | ICD-10-CM | POA: Diagnosis not present

## 2021-02-13 DIAGNOSIS — M6281 Muscle weakness (generalized): Secondary | ICD-10-CM | POA: Diagnosis not present

## 2021-02-13 DIAGNOSIS — R1310 Dysphagia, unspecified: Secondary | ICD-10-CM | POA: Diagnosis not present

## 2021-02-16 DIAGNOSIS — M25511 Pain in right shoulder: Secondary | ICD-10-CM | POA: Diagnosis not present

## 2021-02-16 DIAGNOSIS — R600 Localized edema: Secondary | ICD-10-CM | POA: Diagnosis not present

## 2021-02-16 DIAGNOSIS — M109 Gout, unspecified: Secondary | ICD-10-CM | POA: Diagnosis not present

## 2021-02-16 DIAGNOSIS — E785 Hyperlipidemia, unspecified: Secondary | ICD-10-CM | POA: Diagnosis not present

## 2021-02-16 DIAGNOSIS — R1310 Dysphagia, unspecified: Secondary | ICD-10-CM | POA: Diagnosis not present

## 2021-02-16 DIAGNOSIS — K219 Gastro-esophageal reflux disease without esophagitis: Secondary | ICD-10-CM | POA: Diagnosis not present

## 2021-02-16 DIAGNOSIS — E039 Hypothyroidism, unspecified: Secondary | ICD-10-CM | POA: Diagnosis not present

## 2021-02-16 DIAGNOSIS — R0602 Shortness of breath: Secondary | ICD-10-CM | POA: Diagnosis not present

## 2021-02-16 DIAGNOSIS — R41841 Cognitive communication deficit: Secondary | ICD-10-CM | POA: Diagnosis not present

## 2021-02-16 DIAGNOSIS — I10 Essential (primary) hypertension: Secondary | ICD-10-CM | POA: Diagnosis not present

## 2021-02-16 DIAGNOSIS — M6281 Muscle weakness (generalized): Secondary | ICD-10-CM | POA: Diagnosis not present

## 2021-02-16 DIAGNOSIS — S42354D Nondisplaced comminuted fracture of shaft of humerus, right arm, subsequent encounter for fracture with routine healing: Secondary | ICD-10-CM | POA: Diagnosis not present

## 2021-02-16 DIAGNOSIS — D508 Other iron deficiency anemias: Secondary | ICD-10-CM | POA: Diagnosis not present

## 2021-02-16 DIAGNOSIS — C73 Malignant neoplasm of thyroid gland: Secondary | ICD-10-CM | POA: Diagnosis not present

## 2021-02-16 DIAGNOSIS — G20 Parkinson's disease: Secondary | ICD-10-CM | POA: Diagnosis not present

## 2021-02-16 DIAGNOSIS — R5381 Other malaise: Secondary | ICD-10-CM | POA: Diagnosis not present

## 2021-02-17 DIAGNOSIS — R41841 Cognitive communication deficit: Secondary | ICD-10-CM | POA: Diagnosis not present

## 2021-02-17 DIAGNOSIS — M6281 Muscle weakness (generalized): Secondary | ICD-10-CM | POA: Diagnosis not present

## 2021-02-17 DIAGNOSIS — S42354D Nondisplaced comminuted fracture of shaft of humerus, right arm, subsequent encounter for fracture with routine healing: Secondary | ICD-10-CM | POA: Diagnosis not present

## 2021-02-17 DIAGNOSIS — G20 Parkinson's disease: Secondary | ICD-10-CM | POA: Diagnosis not present

## 2021-02-17 DIAGNOSIS — R1310 Dysphagia, unspecified: Secondary | ICD-10-CM | POA: Diagnosis not present

## 2021-02-18 DIAGNOSIS — R600 Localized edema: Secondary | ICD-10-CM | POA: Diagnosis not present

## 2021-02-18 DIAGNOSIS — M6281 Muscle weakness (generalized): Secondary | ICD-10-CM | POA: Diagnosis not present

## 2021-02-18 DIAGNOSIS — E785 Hyperlipidemia, unspecified: Secondary | ICD-10-CM | POA: Diagnosis not present

## 2021-02-18 DIAGNOSIS — M25511 Pain in right shoulder: Secondary | ICD-10-CM | POA: Diagnosis not present

## 2021-02-18 DIAGNOSIS — S42354D Nondisplaced comminuted fracture of shaft of humerus, right arm, subsequent encounter for fracture with routine healing: Secondary | ICD-10-CM | POA: Diagnosis not present

## 2021-02-18 DIAGNOSIS — J90 Pleural effusion, not elsewhere classified: Secondary | ICD-10-CM | POA: Diagnosis not present

## 2021-02-18 DIAGNOSIS — J189 Pneumonia, unspecified organism: Secondary | ICD-10-CM | POA: Diagnosis not present

## 2021-02-18 DIAGNOSIS — R1310 Dysphagia, unspecified: Secondary | ICD-10-CM | POA: Diagnosis not present

## 2021-02-18 DIAGNOSIS — G20 Parkinson's disease: Secondary | ICD-10-CM | POA: Diagnosis not present

## 2021-02-18 DIAGNOSIS — M109 Gout, unspecified: Secondary | ICD-10-CM | POA: Diagnosis not present

## 2021-02-18 DIAGNOSIS — R41841 Cognitive communication deficit: Secondary | ICD-10-CM | POA: Diagnosis not present

## 2021-02-18 DIAGNOSIS — F429 Obsessive-compulsive disorder, unspecified: Secondary | ICD-10-CM | POA: Diagnosis not present

## 2021-02-18 DIAGNOSIS — H43399 Other vitreous opacities, unspecified eye: Secondary | ICD-10-CM | POA: Diagnosis not present

## 2021-02-19 DIAGNOSIS — E039 Hypothyroidism, unspecified: Secondary | ICD-10-CM | POA: Diagnosis not present

## 2021-02-20 DIAGNOSIS — R1310 Dysphagia, unspecified: Secondary | ICD-10-CM | POA: Diagnosis not present

## 2021-02-20 DIAGNOSIS — E785 Hyperlipidemia, unspecified: Secondary | ICD-10-CM | POA: Diagnosis not present

## 2021-02-20 DIAGNOSIS — I10 Essential (primary) hypertension: Secondary | ICD-10-CM | POA: Diagnosis not present

## 2021-02-20 DIAGNOSIS — G8929 Other chronic pain: Secondary | ICD-10-CM | POA: Diagnosis not present

## 2021-02-20 DIAGNOSIS — G20 Parkinson's disease: Secondary | ICD-10-CM | POA: Diagnosis not present

## 2021-02-20 DIAGNOSIS — M6281 Muscle weakness (generalized): Secondary | ICD-10-CM | POA: Diagnosis not present

## 2021-02-20 DIAGNOSIS — E039 Hypothyroidism, unspecified: Secondary | ICD-10-CM | POA: Diagnosis not present

## 2021-02-20 DIAGNOSIS — F445 Conversion disorder with seizures or convulsions: Secondary | ICD-10-CM | POA: Diagnosis not present

## 2021-02-20 DIAGNOSIS — S42354D Nondisplaced comminuted fracture of shaft of humerus, right arm, subsequent encounter for fracture with routine healing: Secondary | ICD-10-CM | POA: Diagnosis not present

## 2021-02-20 DIAGNOSIS — F29 Unspecified psychosis not due to a substance or known physiological condition: Secondary | ICD-10-CM | POA: Diagnosis not present

## 2021-02-20 DIAGNOSIS — D508 Other iron deficiency anemias: Secondary | ICD-10-CM | POA: Diagnosis not present

## 2021-02-20 DIAGNOSIS — F321 Major depressive disorder, single episode, moderate: Secondary | ICD-10-CM | POA: Diagnosis not present

## 2021-02-20 DIAGNOSIS — R41841 Cognitive communication deficit: Secondary | ICD-10-CM | POA: Diagnosis not present

## 2021-02-23 DIAGNOSIS — G20 Parkinson's disease: Secondary | ICD-10-CM | POA: Diagnosis not present

## 2021-02-23 DIAGNOSIS — R41841 Cognitive communication deficit: Secondary | ICD-10-CM | POA: Diagnosis not present

## 2021-02-23 DIAGNOSIS — I10 Essential (primary) hypertension: Secondary | ICD-10-CM | POA: Diagnosis not present

## 2021-02-23 DIAGNOSIS — R1310 Dysphagia, unspecified: Secondary | ICD-10-CM | POA: Diagnosis not present

## 2021-02-23 DIAGNOSIS — S42354D Nondisplaced comminuted fracture of shaft of humerus, right arm, subsequent encounter for fracture with routine healing: Secondary | ICD-10-CM | POA: Diagnosis not present

## 2021-02-23 DIAGNOSIS — M6281 Muscle weakness (generalized): Secondary | ICD-10-CM | POA: Diagnosis not present

## 2021-02-24 DIAGNOSIS — G20 Parkinson's disease: Secondary | ICD-10-CM | POA: Diagnosis not present

## 2021-02-24 DIAGNOSIS — M6281 Muscle weakness (generalized): Secondary | ICD-10-CM | POA: Diagnosis not present

## 2021-02-24 DIAGNOSIS — R1310 Dysphagia, unspecified: Secondary | ICD-10-CM | POA: Diagnosis not present

## 2021-02-24 DIAGNOSIS — S42354D Nondisplaced comminuted fracture of shaft of humerus, right arm, subsequent encounter for fracture with routine healing: Secondary | ICD-10-CM | POA: Diagnosis not present

## 2021-02-24 DIAGNOSIS — R41841 Cognitive communication deficit: Secondary | ICD-10-CM | POA: Diagnosis not present

## 2021-02-24 DIAGNOSIS — D509 Iron deficiency anemia, unspecified: Secondary | ICD-10-CM | POA: Diagnosis not present

## 2021-02-24 DIAGNOSIS — I10 Essential (primary) hypertension: Secondary | ICD-10-CM | POA: Diagnosis not present

## 2021-02-24 DIAGNOSIS — E119 Type 2 diabetes mellitus without complications: Secondary | ICD-10-CM | POA: Diagnosis not present

## 2021-02-25 DIAGNOSIS — R41841 Cognitive communication deficit: Secondary | ICD-10-CM | POA: Diagnosis not present

## 2021-02-25 DIAGNOSIS — M6281 Muscle weakness (generalized): Secondary | ICD-10-CM | POA: Diagnosis not present

## 2021-02-25 DIAGNOSIS — G20 Parkinson's disease: Secondary | ICD-10-CM | POA: Diagnosis not present

## 2021-02-25 DIAGNOSIS — R1310 Dysphagia, unspecified: Secondary | ICD-10-CM | POA: Diagnosis not present

## 2021-02-25 DIAGNOSIS — J189 Pneumonia, unspecified organism: Secondary | ICD-10-CM | POA: Diagnosis not present

## 2021-02-25 DIAGNOSIS — S42354D Nondisplaced comminuted fracture of shaft of humerus, right arm, subsequent encounter for fracture with routine healing: Secondary | ICD-10-CM | POA: Diagnosis not present

## 2021-02-28 DIAGNOSIS — G20 Parkinson's disease: Secondary | ICD-10-CM | POA: Diagnosis not present

## 2021-02-28 DIAGNOSIS — R41841 Cognitive communication deficit: Secondary | ICD-10-CM | POA: Diagnosis not present

## 2021-02-28 DIAGNOSIS — M6281 Muscle weakness (generalized): Secondary | ICD-10-CM | POA: Diagnosis not present

## 2021-02-28 DIAGNOSIS — R1310 Dysphagia, unspecified: Secondary | ICD-10-CM | POA: Diagnosis not present

## 2021-02-28 DIAGNOSIS — S42354D Nondisplaced comminuted fracture of shaft of humerus, right arm, subsequent encounter for fracture with routine healing: Secondary | ICD-10-CM | POA: Diagnosis not present

## 2021-03-02 DIAGNOSIS — J90 Pleural effusion, not elsewhere classified: Secondary | ICD-10-CM | POA: Diagnosis not present

## 2021-03-02 DIAGNOSIS — R5381 Other malaise: Secondary | ICD-10-CM | POA: Diagnosis not present

## 2021-03-02 DIAGNOSIS — J189 Pneumonia, unspecified organism: Secondary | ICD-10-CM | POA: Diagnosis not present

## 2021-03-02 DIAGNOSIS — E039 Hypothyroidism, unspecified: Secondary | ICD-10-CM | POA: Diagnosis not present

## 2021-03-02 DIAGNOSIS — M25511 Pain in right shoulder: Secondary | ICD-10-CM | POA: Diagnosis not present

## 2021-03-02 DIAGNOSIS — R600 Localized edema: Secondary | ICD-10-CM | POA: Diagnosis not present

## 2021-03-02 DIAGNOSIS — S0003XA Contusion of scalp, initial encounter: Secondary | ICD-10-CM | POA: Diagnosis not present

## 2021-03-02 DIAGNOSIS — M109 Gout, unspecified: Secondary | ICD-10-CM | POA: Diagnosis not present

## 2021-03-02 DIAGNOSIS — R2681 Unsteadiness on feet: Secondary | ICD-10-CM | POA: Diagnosis not present

## 2021-03-02 DIAGNOSIS — R262 Difficulty in walking, not elsewhere classified: Secondary | ICD-10-CM | POA: Diagnosis not present

## 2021-03-02 DIAGNOSIS — M6281 Muscle weakness (generalized): Secondary | ICD-10-CM | POA: Diagnosis not present

## 2021-03-02 DIAGNOSIS — S42354D Nondisplaced comminuted fracture of shaft of humerus, right arm, subsequent encounter for fracture with routine healing: Secondary | ICD-10-CM | POA: Diagnosis not present

## 2021-03-02 DIAGNOSIS — R1310 Dysphagia, unspecified: Secondary | ICD-10-CM | POA: Diagnosis not present

## 2021-03-02 DIAGNOSIS — E785 Hyperlipidemia, unspecified: Secondary | ICD-10-CM | POA: Diagnosis not present

## 2021-03-03 DIAGNOSIS — R262 Difficulty in walking, not elsewhere classified: Secondary | ICD-10-CM | POA: Diagnosis not present

## 2021-03-03 DIAGNOSIS — Z8781 Personal history of (healed) traumatic fracture: Secondary | ICD-10-CM | POA: Diagnosis not present

## 2021-03-03 DIAGNOSIS — Z4789 Encounter for other orthopedic aftercare: Secondary | ICD-10-CM | POA: Diagnosis not present

## 2021-03-03 DIAGNOSIS — M6281 Muscle weakness (generalized): Secondary | ICD-10-CM | POA: Diagnosis not present

## 2021-03-03 DIAGNOSIS — S42354D Nondisplaced comminuted fracture of shaft of humerus, right arm, subsequent encounter for fracture with routine healing: Secondary | ICD-10-CM | POA: Diagnosis not present

## 2021-03-03 DIAGNOSIS — R2681 Unsteadiness on feet: Secondary | ICD-10-CM | POA: Diagnosis not present

## 2021-03-04 DIAGNOSIS — K219 Gastro-esophageal reflux disease without esophagitis: Secondary | ICD-10-CM | POA: Diagnosis not present

## 2021-03-04 DIAGNOSIS — R1310 Dysphagia, unspecified: Secondary | ICD-10-CM | POA: Diagnosis not present

## 2021-03-04 DIAGNOSIS — G8929 Other chronic pain: Secondary | ICD-10-CM | POA: Diagnosis not present

## 2021-03-04 DIAGNOSIS — I10 Essential (primary) hypertension: Secondary | ICD-10-CM | POA: Diagnosis not present

## 2021-03-04 DIAGNOSIS — J90 Pleural effusion, not elsewhere classified: Secondary | ICD-10-CM | POA: Diagnosis not present

## 2021-03-04 DIAGNOSIS — C73 Malignant neoplasm of thyroid gland: Secondary | ICD-10-CM | POA: Diagnosis not present

## 2021-03-04 DIAGNOSIS — M109 Gout, unspecified: Secondary | ICD-10-CM | POA: Diagnosis not present

## 2021-03-04 DIAGNOSIS — E039 Hypothyroidism, unspecified: Secondary | ICD-10-CM | POA: Diagnosis not present

## 2021-03-04 DIAGNOSIS — J189 Pneumonia, unspecified organism: Secondary | ICD-10-CM | POA: Diagnosis not present

## 2021-03-04 DIAGNOSIS — M25511 Pain in right shoulder: Secondary | ICD-10-CM | POA: Diagnosis not present

## 2021-03-04 DIAGNOSIS — R600 Localized edema: Secondary | ICD-10-CM | POA: Diagnosis not present

## 2021-03-04 DIAGNOSIS — D508 Other iron deficiency anemias: Secondary | ICD-10-CM | POA: Diagnosis not present

## 2021-03-04 DIAGNOSIS — B372 Candidiasis of skin and nail: Secondary | ICD-10-CM | POA: Diagnosis not present

## 2021-03-05 DIAGNOSIS — R262 Difficulty in walking, not elsewhere classified: Secondary | ICD-10-CM | POA: Diagnosis not present

## 2021-03-05 DIAGNOSIS — M6281 Muscle weakness (generalized): Secondary | ICD-10-CM | POA: Diagnosis not present

## 2021-03-05 DIAGNOSIS — S42354D Nondisplaced comminuted fracture of shaft of humerus, right arm, subsequent encounter for fracture with routine healing: Secondary | ICD-10-CM | POA: Diagnosis not present

## 2021-03-05 DIAGNOSIS — R2681 Unsteadiness on feet: Secondary | ICD-10-CM | POA: Diagnosis not present

## 2021-03-09 DIAGNOSIS — S42354D Nondisplaced comminuted fracture of shaft of humerus, right arm, subsequent encounter for fracture with routine healing: Secondary | ICD-10-CM | POA: Diagnosis not present

## 2021-03-09 DIAGNOSIS — Z9889 Other specified postprocedural states: Secondary | ICD-10-CM | POA: Diagnosis not present

## 2021-03-09 DIAGNOSIS — R262 Difficulty in walking, not elsewhere classified: Secondary | ICD-10-CM | POA: Diagnosis not present

## 2021-03-09 DIAGNOSIS — R2681 Unsteadiness on feet: Secondary | ICD-10-CM | POA: Diagnosis not present

## 2021-03-09 DIAGNOSIS — Z8781 Personal history of (healed) traumatic fracture: Secondary | ICD-10-CM | POA: Diagnosis not present

## 2021-03-09 DIAGNOSIS — M6281 Muscle weakness (generalized): Secondary | ICD-10-CM | POA: Diagnosis not present

## 2021-03-10 DIAGNOSIS — M6281 Muscle weakness (generalized): Secondary | ICD-10-CM | POA: Diagnosis not present

## 2021-03-10 DIAGNOSIS — R262 Difficulty in walking, not elsewhere classified: Secondary | ICD-10-CM | POA: Diagnosis not present

## 2021-03-10 DIAGNOSIS — S42354D Nondisplaced comminuted fracture of shaft of humerus, right arm, subsequent encounter for fracture with routine healing: Secondary | ICD-10-CM | POA: Diagnosis not present

## 2021-03-10 DIAGNOSIS — R2681 Unsteadiness on feet: Secondary | ICD-10-CM | POA: Diagnosis not present

## 2021-03-12 DIAGNOSIS — R2681 Unsteadiness on feet: Secondary | ICD-10-CM | POA: Diagnosis not present

## 2021-03-12 DIAGNOSIS — K219 Gastro-esophageal reflux disease without esophagitis: Secondary | ICD-10-CM | POA: Diagnosis not present

## 2021-03-12 DIAGNOSIS — R262 Difficulty in walking, not elsewhere classified: Secondary | ICD-10-CM | POA: Diagnosis not present

## 2021-03-12 DIAGNOSIS — E785 Hyperlipidemia, unspecified: Secondary | ICD-10-CM | POA: Diagnosis not present

## 2021-03-12 DIAGNOSIS — E039 Hypothyroidism, unspecified: Secondary | ICD-10-CM | POA: Diagnosis not present

## 2021-03-12 DIAGNOSIS — S42354D Nondisplaced comminuted fracture of shaft of humerus, right arm, subsequent encounter for fracture with routine healing: Secondary | ICD-10-CM | POA: Diagnosis not present

## 2021-03-12 DIAGNOSIS — J189 Pneumonia, unspecified organism: Secondary | ICD-10-CM | POA: Diagnosis not present

## 2021-03-12 DIAGNOSIS — M6281 Muscle weakness (generalized): Secondary | ICD-10-CM | POA: Diagnosis not present

## 2021-03-12 DIAGNOSIS — I10 Essential (primary) hypertension: Secondary | ICD-10-CM | POA: Diagnosis not present

## 2021-03-12 DIAGNOSIS — J90 Pleural effusion, not elsewhere classified: Secondary | ICD-10-CM | POA: Diagnosis not present

## 2021-03-12 DIAGNOSIS — D508 Other iron deficiency anemias: Secondary | ICD-10-CM | POA: Diagnosis not present

## 2021-03-13 DIAGNOSIS — S42354D Nondisplaced comminuted fracture of shaft of humerus, right arm, subsequent encounter for fracture with routine healing: Secondary | ICD-10-CM | POA: Diagnosis not present

## 2021-03-13 DIAGNOSIS — R262 Difficulty in walking, not elsewhere classified: Secondary | ICD-10-CM | POA: Diagnosis not present

## 2021-03-13 DIAGNOSIS — M6281 Muscle weakness (generalized): Secondary | ICD-10-CM | POA: Diagnosis not present

## 2021-03-13 DIAGNOSIS — R2681 Unsteadiness on feet: Secondary | ICD-10-CM | POA: Diagnosis not present

## 2021-03-15 DIAGNOSIS — R2681 Unsteadiness on feet: Secondary | ICD-10-CM | POA: Diagnosis not present

## 2021-03-15 DIAGNOSIS — S42354D Nondisplaced comminuted fracture of shaft of humerus, right arm, subsequent encounter for fracture with routine healing: Secondary | ICD-10-CM | POA: Diagnosis not present

## 2021-03-15 DIAGNOSIS — R262 Difficulty in walking, not elsewhere classified: Secondary | ICD-10-CM | POA: Diagnosis not present

## 2021-03-15 DIAGNOSIS — M6281 Muscle weakness (generalized): Secondary | ICD-10-CM | POA: Diagnosis not present

## 2021-03-16 DIAGNOSIS — R42 Dizziness and giddiness: Secondary | ICD-10-CM | POA: Diagnosis not present

## 2021-03-16 DIAGNOSIS — R600 Localized edema: Secondary | ICD-10-CM | POA: Diagnosis not present

## 2021-03-16 DIAGNOSIS — B372 Candidiasis of skin and nail: Secondary | ICD-10-CM | POA: Diagnosis not present

## 2021-03-16 DIAGNOSIS — J189 Pneumonia, unspecified organism: Secondary | ICD-10-CM | POA: Diagnosis not present

## 2021-03-16 DIAGNOSIS — K5900 Constipation, unspecified: Secondary | ICD-10-CM | POA: Diagnosis not present

## 2021-03-16 DIAGNOSIS — R0602 Shortness of breath: Secondary | ICD-10-CM | POA: Diagnosis not present

## 2021-03-16 DIAGNOSIS — E785 Hyperlipidemia, unspecified: Secondary | ICD-10-CM | POA: Diagnosis not present

## 2021-03-16 DIAGNOSIS — M25511 Pain in right shoulder: Secondary | ICD-10-CM | POA: Diagnosis not present

## 2021-03-16 DIAGNOSIS — K219 Gastro-esophageal reflux disease without esophagitis: Secondary | ICD-10-CM | POA: Diagnosis not present

## 2021-03-16 DIAGNOSIS — I10 Essential (primary) hypertension: Secondary | ICD-10-CM | POA: Diagnosis not present

## 2021-03-16 DIAGNOSIS — J90 Pleural effusion, not elsewhere classified: Secondary | ICD-10-CM | POA: Diagnosis not present

## 2021-03-17 DIAGNOSIS — S42354D Nondisplaced comminuted fracture of shaft of humerus, right arm, subsequent encounter for fracture with routine healing: Secondary | ICD-10-CM | POA: Diagnosis not present

## 2021-03-17 DIAGNOSIS — E785 Hyperlipidemia, unspecified: Secondary | ICD-10-CM | POA: Diagnosis not present

## 2021-03-17 DIAGNOSIS — M6281 Muscle weakness (generalized): Secondary | ICD-10-CM | POA: Diagnosis not present

## 2021-03-17 DIAGNOSIS — R2681 Unsteadiness on feet: Secondary | ICD-10-CM | POA: Diagnosis not present

## 2021-03-17 DIAGNOSIS — R262 Difficulty in walking, not elsewhere classified: Secondary | ICD-10-CM | POA: Diagnosis not present

## 2021-03-18 DIAGNOSIS — D508 Other iron deficiency anemias: Secondary | ICD-10-CM | POA: Diagnosis not present

## 2021-03-18 DIAGNOSIS — R5381 Other malaise: Secondary | ICD-10-CM | POA: Diagnosis not present

## 2021-03-18 DIAGNOSIS — E039 Hypothyroidism, unspecified: Secondary | ICD-10-CM | POA: Diagnosis not present

## 2021-03-18 DIAGNOSIS — R600 Localized edema: Secondary | ICD-10-CM | POA: Diagnosis not present

## 2021-03-18 DIAGNOSIS — R1310 Dysphagia, unspecified: Secondary | ICD-10-CM | POA: Diagnosis not present

## 2021-03-18 DIAGNOSIS — K5900 Constipation, unspecified: Secondary | ICD-10-CM | POA: Diagnosis not present

## 2021-03-18 DIAGNOSIS — E785 Hyperlipidemia, unspecified: Secondary | ICD-10-CM | POA: Diagnosis not present

## 2021-03-18 DIAGNOSIS — R262 Difficulty in walking, not elsewhere classified: Secondary | ICD-10-CM | POA: Diagnosis not present

## 2021-03-18 DIAGNOSIS — S42354D Nondisplaced comminuted fracture of shaft of humerus, right arm, subsequent encounter for fracture with routine healing: Secondary | ICD-10-CM | POA: Diagnosis not present

## 2021-03-18 DIAGNOSIS — R2681 Unsteadiness on feet: Secondary | ICD-10-CM | POA: Diagnosis not present

## 2021-03-18 DIAGNOSIS — M6281 Muscle weakness (generalized): Secondary | ICD-10-CM | POA: Diagnosis not present

## 2021-03-18 DIAGNOSIS — C73 Malignant neoplasm of thyroid gland: Secondary | ICD-10-CM | POA: Diagnosis not present

## 2021-03-18 DIAGNOSIS — K219 Gastro-esophageal reflux disease without esophagitis: Secondary | ICD-10-CM | POA: Diagnosis not present

## 2021-03-18 DIAGNOSIS — R42 Dizziness and giddiness: Secondary | ICD-10-CM | POA: Diagnosis not present

## 2021-03-19 DIAGNOSIS — R2681 Unsteadiness on feet: Secondary | ICD-10-CM | POA: Diagnosis not present

## 2021-03-19 DIAGNOSIS — S42354D Nondisplaced comminuted fracture of shaft of humerus, right arm, subsequent encounter for fracture with routine healing: Secondary | ICD-10-CM | POA: Diagnosis not present

## 2021-03-19 DIAGNOSIS — R262 Difficulty in walking, not elsewhere classified: Secondary | ICD-10-CM | POA: Diagnosis not present

## 2021-03-19 DIAGNOSIS — M6281 Muscle weakness (generalized): Secondary | ICD-10-CM | POA: Diagnosis not present

## 2021-03-20 DIAGNOSIS — R2681 Unsteadiness on feet: Secondary | ICD-10-CM | POA: Diagnosis not present

## 2021-03-20 DIAGNOSIS — M6281 Muscle weakness (generalized): Secondary | ICD-10-CM | POA: Diagnosis not present

## 2021-03-20 DIAGNOSIS — R262 Difficulty in walking, not elsewhere classified: Secondary | ICD-10-CM | POA: Diagnosis not present

## 2021-03-20 DIAGNOSIS — S42354D Nondisplaced comminuted fracture of shaft of humerus, right arm, subsequent encounter for fracture with routine healing: Secondary | ICD-10-CM | POA: Diagnosis not present

## 2021-03-22 DIAGNOSIS — M79604 Pain in right leg: Secondary | ICD-10-CM | POA: Diagnosis not present

## 2021-03-23 DIAGNOSIS — R2681 Unsteadiness on feet: Secondary | ICD-10-CM | POA: Diagnosis not present

## 2021-03-23 DIAGNOSIS — M25561 Pain in right knee: Secondary | ICD-10-CM | POA: Diagnosis not present

## 2021-03-23 DIAGNOSIS — R262 Difficulty in walking, not elsewhere classified: Secondary | ICD-10-CM | POA: Diagnosis not present

## 2021-03-23 DIAGNOSIS — M79661 Pain in right lower leg: Secondary | ICD-10-CM | POA: Diagnosis not present

## 2021-03-23 DIAGNOSIS — S42354D Nondisplaced comminuted fracture of shaft of humerus, right arm, subsequent encounter for fracture with routine healing: Secondary | ICD-10-CM | POA: Diagnosis not present

## 2021-03-23 DIAGNOSIS — M6281 Muscle weakness (generalized): Secondary | ICD-10-CM | POA: Diagnosis not present

## 2021-03-24 DIAGNOSIS — L603 Nail dystrophy: Secondary | ICD-10-CM | POA: Diagnosis not present

## 2021-03-24 DIAGNOSIS — I7091 Generalized atherosclerosis: Secondary | ICD-10-CM | POA: Diagnosis not present

## 2021-03-24 DIAGNOSIS — R262 Difficulty in walking, not elsewhere classified: Secondary | ICD-10-CM | POA: Diagnosis not present

## 2021-03-24 DIAGNOSIS — F321 Major depressive disorder, single episode, moderate: Secondary | ICD-10-CM | POA: Diagnosis not present

## 2021-03-24 DIAGNOSIS — G20 Parkinson's disease: Secondary | ICD-10-CM | POA: Diagnosis not present

## 2021-03-24 DIAGNOSIS — S42354D Nondisplaced comminuted fracture of shaft of humerus, right arm, subsequent encounter for fracture with routine healing: Secondary | ICD-10-CM | POA: Diagnosis not present

## 2021-03-24 DIAGNOSIS — F445 Conversion disorder with seizures or convulsions: Secondary | ICD-10-CM | POA: Diagnosis not present

## 2021-03-24 DIAGNOSIS — F29 Unspecified psychosis not due to a substance or known physiological condition: Secondary | ICD-10-CM | POA: Diagnosis not present

## 2021-03-24 DIAGNOSIS — R600 Localized edema: Secondary | ICD-10-CM | POA: Diagnosis not present

## 2021-03-24 DIAGNOSIS — R2681 Unsteadiness on feet: Secondary | ICD-10-CM | POA: Diagnosis not present

## 2021-03-24 DIAGNOSIS — G8929 Other chronic pain: Secondary | ICD-10-CM | POA: Diagnosis not present

## 2021-03-24 DIAGNOSIS — M6281 Muscle weakness (generalized): Secondary | ICD-10-CM | POA: Diagnosis not present

## 2021-03-25 DIAGNOSIS — W19XXXA Unspecified fall, initial encounter: Secondary | ICD-10-CM | POA: Diagnosis not present

## 2021-03-25 DIAGNOSIS — M7989 Other specified soft tissue disorders: Secondary | ICD-10-CM | POA: Diagnosis not present

## 2021-03-25 DIAGNOSIS — M6281 Muscle weakness (generalized): Secondary | ICD-10-CM | POA: Diagnosis not present

## 2021-03-25 DIAGNOSIS — Z79899 Other long term (current) drug therapy: Secondary | ICD-10-CM | POA: Diagnosis not present

## 2021-03-25 DIAGNOSIS — M109 Gout, unspecified: Secondary | ICD-10-CM | POA: Diagnosis not present

## 2021-03-25 DIAGNOSIS — R1310 Dysphagia, unspecified: Secondary | ICD-10-CM | POA: Diagnosis not present

## 2021-03-25 DIAGNOSIS — S42354D Nondisplaced comminuted fracture of shaft of humerus, right arm, subsequent encounter for fracture with routine healing: Secondary | ICD-10-CM | POA: Diagnosis not present

## 2021-03-25 DIAGNOSIS — R2681 Unsteadiness on feet: Secondary | ICD-10-CM | POA: Diagnosis not present

## 2021-03-25 DIAGNOSIS — S72331A Displaced oblique fracture of shaft of right femur, initial encounter for closed fracture: Secondary | ICD-10-CM | POA: Diagnosis not present

## 2021-03-25 DIAGNOSIS — I10 Essential (primary) hypertension: Secondary | ICD-10-CM | POA: Diagnosis not present

## 2021-03-25 DIAGNOSIS — Z881 Allergy status to other antibiotic agents status: Secondary | ICD-10-CM | POA: Diagnosis not present

## 2021-03-25 DIAGNOSIS — S42291A Other displaced fracture of upper end of right humerus, initial encounter for closed fracture: Secondary | ICD-10-CM | POA: Diagnosis not present

## 2021-03-25 DIAGNOSIS — D508 Other iron deficiency anemias: Secondary | ICD-10-CM | POA: Diagnosis not present

## 2021-03-25 DIAGNOSIS — G8911 Acute pain due to trauma: Secondary | ICD-10-CM | POA: Diagnosis not present

## 2021-03-25 DIAGNOSIS — E079 Disorder of thyroid, unspecified: Secondary | ICD-10-CM | POA: Diagnosis not present

## 2021-03-25 DIAGNOSIS — K219 Gastro-esophageal reflux disease without esophagitis: Secondary | ICD-10-CM | POA: Diagnosis not present

## 2021-03-25 DIAGNOSIS — Z7982 Long term (current) use of aspirin: Secondary | ICD-10-CM | POA: Diagnosis not present

## 2021-03-25 DIAGNOSIS — Z888 Allergy status to other drugs, medicaments and biological substances status: Secondary | ICD-10-CM | POA: Diagnosis not present

## 2021-03-25 DIAGNOSIS — R609 Edema, unspecified: Secondary | ICD-10-CM | POA: Diagnosis not present

## 2021-03-25 DIAGNOSIS — R262 Difficulty in walking, not elsewhere classified: Secondary | ICD-10-CM | POA: Diagnosis not present

## 2021-03-25 DIAGNOSIS — M79604 Pain in right leg: Secondary | ICD-10-CM | POA: Diagnosis not present

## 2021-03-25 DIAGNOSIS — E039 Hypothyroidism, unspecified: Secondary | ICD-10-CM | POA: Diagnosis not present

## 2021-03-25 DIAGNOSIS — R5381 Other malaise: Secondary | ICD-10-CM | POA: Diagnosis not present

## 2021-03-25 DIAGNOSIS — S42201A Unspecified fracture of upper end of right humerus, initial encounter for closed fracture: Secondary | ICD-10-CM | POA: Diagnosis not present

## 2021-03-25 DIAGNOSIS — E785 Hyperlipidemia, unspecified: Secondary | ICD-10-CM | POA: Diagnosis not present

## 2021-03-25 DIAGNOSIS — C73 Malignant neoplasm of thyroid gland: Secondary | ICD-10-CM | POA: Diagnosis not present

## 2021-03-27 DIAGNOSIS — R262 Difficulty in walking, not elsewhere classified: Secondary | ICD-10-CM | POA: Diagnosis not present

## 2021-03-27 DIAGNOSIS — R2681 Unsteadiness on feet: Secondary | ICD-10-CM | POA: Diagnosis not present

## 2021-03-27 DIAGNOSIS — S42354D Nondisplaced comminuted fracture of shaft of humerus, right arm, subsequent encounter for fracture with routine healing: Secondary | ICD-10-CM | POA: Diagnosis not present

## 2021-03-27 DIAGNOSIS — M6281 Muscle weakness (generalized): Secondary | ICD-10-CM | POA: Diagnosis not present

## 2021-03-28 DIAGNOSIS — R2681 Unsteadiness on feet: Secondary | ICD-10-CM | POA: Diagnosis not present

## 2021-03-28 DIAGNOSIS — M6281 Muscle weakness (generalized): Secondary | ICD-10-CM | POA: Diagnosis not present

## 2021-03-28 DIAGNOSIS — S42354D Nondisplaced comminuted fracture of shaft of humerus, right arm, subsequent encounter for fracture with routine healing: Secondary | ICD-10-CM | POA: Diagnosis not present

## 2021-03-28 DIAGNOSIS — R262 Difficulty in walking, not elsewhere classified: Secondary | ICD-10-CM | POA: Diagnosis not present

## 2021-03-30 DIAGNOSIS — G8929 Other chronic pain: Secondary | ICD-10-CM | POA: Diagnosis not present

## 2021-03-30 DIAGNOSIS — R1310 Dysphagia, unspecified: Secondary | ICD-10-CM | POA: Diagnosis not present

## 2021-03-30 DIAGNOSIS — D508 Other iron deficiency anemias: Secondary | ICD-10-CM | POA: Diagnosis not present

## 2021-03-30 DIAGNOSIS — R600 Localized edema: Secondary | ICD-10-CM | POA: Diagnosis not present

## 2021-03-30 DIAGNOSIS — I10 Essential (primary) hypertension: Secondary | ICD-10-CM | POA: Diagnosis not present

## 2021-03-30 DIAGNOSIS — M25511 Pain in right shoulder: Secondary | ICD-10-CM | POA: Diagnosis not present

## 2021-03-30 DIAGNOSIS — K219 Gastro-esophageal reflux disease without esophagitis: Secondary | ICD-10-CM | POA: Diagnosis not present

## 2021-03-30 DIAGNOSIS — E039 Hypothyroidism, unspecified: Secondary | ICD-10-CM | POA: Diagnosis not present

## 2021-03-30 DIAGNOSIS — E785 Hyperlipidemia, unspecified: Secondary | ICD-10-CM | POA: Diagnosis not present

## 2021-03-30 DIAGNOSIS — M109 Gout, unspecified: Secondary | ICD-10-CM | POA: Diagnosis not present

## 2021-03-31 DIAGNOSIS — M6281 Muscle weakness (generalized): Secondary | ICD-10-CM | POA: Diagnosis not present

## 2021-03-31 DIAGNOSIS — S42354D Nondisplaced comminuted fracture of shaft of humerus, right arm, subsequent encounter for fracture with routine healing: Secondary | ICD-10-CM | POA: Diagnosis not present

## 2021-03-31 DIAGNOSIS — R2681 Unsteadiness on feet: Secondary | ICD-10-CM | POA: Diagnosis not present

## 2021-03-31 DIAGNOSIS — R262 Difficulty in walking, not elsewhere classified: Secondary | ICD-10-CM | POA: Diagnosis not present

## 2021-04-01 ENCOUNTER — Encounter: Payer: Self-pay | Admitting: Gastroenterology

## 2021-04-01 DIAGNOSIS — S42354D Nondisplaced comminuted fracture of shaft of humerus, right arm, subsequent encounter for fracture with routine healing: Secondary | ICD-10-CM | POA: Diagnosis not present

## 2021-04-01 DIAGNOSIS — M6281 Muscle weakness (generalized): Secondary | ICD-10-CM | POA: Diagnosis not present

## 2021-04-01 DIAGNOSIS — M79601 Pain in right arm: Secondary | ICD-10-CM | POA: Diagnosis not present

## 2021-04-01 DIAGNOSIS — R262 Difficulty in walking, not elsewhere classified: Secondary | ICD-10-CM | POA: Diagnosis not present

## 2021-04-01 DIAGNOSIS — R2681 Unsteadiness on feet: Secondary | ICD-10-CM | POA: Diagnosis not present

## 2021-04-01 DIAGNOSIS — M25571 Pain in right ankle and joints of right foot: Secondary | ICD-10-CM | POA: Diagnosis not present

## 2021-04-01 DIAGNOSIS — S42301A Unspecified fracture of shaft of humerus, right arm, initial encounter for closed fracture: Secondary | ICD-10-CM | POA: Diagnosis not present

## 2021-04-02 DIAGNOSIS — S42354D Nondisplaced comminuted fracture of shaft of humerus, right arm, subsequent encounter for fracture with routine healing: Secondary | ICD-10-CM | POA: Diagnosis not present

## 2021-04-02 DIAGNOSIS — R2681 Unsteadiness on feet: Secondary | ICD-10-CM | POA: Diagnosis not present

## 2021-04-02 DIAGNOSIS — M6281 Muscle weakness (generalized): Secondary | ICD-10-CM | POA: Diagnosis not present

## 2021-04-02 DIAGNOSIS — R262 Difficulty in walking, not elsewhere classified: Secondary | ICD-10-CM | POA: Diagnosis not present

## 2021-04-03 DIAGNOSIS — R2681 Unsteadiness on feet: Secondary | ICD-10-CM | POA: Diagnosis not present

## 2021-04-03 DIAGNOSIS — M6281 Muscle weakness (generalized): Secondary | ICD-10-CM | POA: Diagnosis not present

## 2021-04-03 DIAGNOSIS — R262 Difficulty in walking, not elsewhere classified: Secondary | ICD-10-CM | POA: Diagnosis not present

## 2021-04-03 DIAGNOSIS — S42354D Nondisplaced comminuted fracture of shaft of humerus, right arm, subsequent encounter for fracture with routine healing: Secondary | ICD-10-CM | POA: Diagnosis not present

## 2021-04-04 DIAGNOSIS — R2681 Unsteadiness on feet: Secondary | ICD-10-CM | POA: Diagnosis not present

## 2021-04-04 DIAGNOSIS — S42354D Nondisplaced comminuted fracture of shaft of humerus, right arm, subsequent encounter for fracture with routine healing: Secondary | ICD-10-CM | POA: Diagnosis not present

## 2021-04-04 DIAGNOSIS — R262 Difficulty in walking, not elsewhere classified: Secondary | ICD-10-CM | POA: Diagnosis not present

## 2021-04-04 DIAGNOSIS — M6281 Muscle weakness (generalized): Secondary | ICD-10-CM | POA: Diagnosis not present

## 2021-04-06 DIAGNOSIS — S42354D Nondisplaced comminuted fracture of shaft of humerus, right arm, subsequent encounter for fracture with routine healing: Secondary | ICD-10-CM | POA: Diagnosis not present

## 2021-04-06 DIAGNOSIS — R262 Difficulty in walking, not elsewhere classified: Secondary | ICD-10-CM | POA: Diagnosis not present

## 2021-04-06 DIAGNOSIS — R2681 Unsteadiness on feet: Secondary | ICD-10-CM | POA: Diagnosis not present

## 2021-04-06 DIAGNOSIS — M6281 Muscle weakness (generalized): Secondary | ICD-10-CM | POA: Diagnosis not present

## 2021-04-07 DIAGNOSIS — R2681 Unsteadiness on feet: Secondary | ICD-10-CM | POA: Diagnosis not present

## 2021-04-07 DIAGNOSIS — R262 Difficulty in walking, not elsewhere classified: Secondary | ICD-10-CM | POA: Diagnosis not present

## 2021-04-07 DIAGNOSIS — M6281 Muscle weakness (generalized): Secondary | ICD-10-CM | POA: Diagnosis not present

## 2021-04-07 DIAGNOSIS — S42354D Nondisplaced comminuted fracture of shaft of humerus, right arm, subsequent encounter for fracture with routine healing: Secondary | ICD-10-CM | POA: Diagnosis not present

## 2021-04-08 DIAGNOSIS — R262 Difficulty in walking, not elsewhere classified: Secondary | ICD-10-CM | POA: Diagnosis not present

## 2021-04-08 DIAGNOSIS — S42354D Nondisplaced comminuted fracture of shaft of humerus, right arm, subsequent encounter for fracture with routine healing: Secondary | ICD-10-CM | POA: Diagnosis not present

## 2021-04-08 DIAGNOSIS — R2681 Unsteadiness on feet: Secondary | ICD-10-CM | POA: Diagnosis not present

## 2021-04-08 DIAGNOSIS — M6281 Muscle weakness (generalized): Secondary | ICD-10-CM | POA: Diagnosis not present

## 2021-04-09 DIAGNOSIS — E785 Hyperlipidemia, unspecified: Secondary | ICD-10-CM | POA: Diagnosis not present

## 2021-04-09 DIAGNOSIS — S42354D Nondisplaced comminuted fracture of shaft of humerus, right arm, subsequent encounter for fracture with routine healing: Secondary | ICD-10-CM | POA: Diagnosis not present

## 2021-04-09 DIAGNOSIS — E039 Hypothyroidism, unspecified: Secondary | ICD-10-CM | POA: Diagnosis not present

## 2021-04-09 DIAGNOSIS — M6281 Muscle weakness (generalized): Secondary | ICD-10-CM | POA: Diagnosis not present

## 2021-04-09 DIAGNOSIS — I10 Essential (primary) hypertension: Secondary | ICD-10-CM | POA: Diagnosis not present

## 2021-04-09 DIAGNOSIS — R2681 Unsteadiness on feet: Secondary | ICD-10-CM | POA: Diagnosis not present

## 2021-04-09 DIAGNOSIS — D508 Other iron deficiency anemias: Secondary | ICD-10-CM | POA: Diagnosis not present

## 2021-04-09 DIAGNOSIS — R262 Difficulty in walking, not elsewhere classified: Secondary | ICD-10-CM | POA: Diagnosis not present

## 2021-04-10 DIAGNOSIS — S42354D Nondisplaced comminuted fracture of shaft of humerus, right arm, subsequent encounter for fracture with routine healing: Secondary | ICD-10-CM | POA: Diagnosis not present

## 2021-04-10 DIAGNOSIS — M6281 Muscle weakness (generalized): Secondary | ICD-10-CM | POA: Diagnosis not present

## 2021-04-10 DIAGNOSIS — R262 Difficulty in walking, not elsewhere classified: Secondary | ICD-10-CM | POA: Diagnosis not present

## 2021-04-10 DIAGNOSIS — R2681 Unsteadiness on feet: Secondary | ICD-10-CM | POA: Diagnosis not present

## 2021-04-13 DIAGNOSIS — S42354D Nondisplaced comminuted fracture of shaft of humerus, right arm, subsequent encounter for fracture with routine healing: Secondary | ICD-10-CM | POA: Diagnosis not present

## 2021-04-13 DIAGNOSIS — M6281 Muscle weakness (generalized): Secondary | ICD-10-CM | POA: Diagnosis not present

## 2021-04-13 DIAGNOSIS — R2681 Unsteadiness on feet: Secondary | ICD-10-CM | POA: Diagnosis not present

## 2021-04-13 DIAGNOSIS — R262 Difficulty in walking, not elsewhere classified: Secondary | ICD-10-CM | POA: Diagnosis not present

## 2021-04-14 DIAGNOSIS — R262 Difficulty in walking, not elsewhere classified: Secondary | ICD-10-CM | POA: Diagnosis not present

## 2021-04-14 DIAGNOSIS — R2681 Unsteadiness on feet: Secondary | ICD-10-CM | POA: Diagnosis not present

## 2021-04-14 DIAGNOSIS — S42354D Nondisplaced comminuted fracture of shaft of humerus, right arm, subsequent encounter for fracture with routine healing: Secondary | ICD-10-CM | POA: Diagnosis not present

## 2021-04-14 DIAGNOSIS — M6281 Muscle weakness (generalized): Secondary | ICD-10-CM | POA: Diagnosis not present

## 2021-04-15 DIAGNOSIS — R2681 Unsteadiness on feet: Secondary | ICD-10-CM | POA: Diagnosis not present

## 2021-04-15 DIAGNOSIS — S42354D Nondisplaced comminuted fracture of shaft of humerus, right arm, subsequent encounter for fracture with routine healing: Secondary | ICD-10-CM | POA: Diagnosis not present

## 2021-04-15 DIAGNOSIS — M6281 Muscle weakness (generalized): Secondary | ICD-10-CM | POA: Diagnosis not present

## 2021-04-15 DIAGNOSIS — R262 Difficulty in walking, not elsewhere classified: Secondary | ICD-10-CM | POA: Diagnosis not present

## 2021-04-16 DIAGNOSIS — S42354D Nondisplaced comminuted fracture of shaft of humerus, right arm, subsequent encounter for fracture with routine healing: Secondary | ICD-10-CM | POA: Diagnosis not present

## 2021-04-16 DIAGNOSIS — R262 Difficulty in walking, not elsewhere classified: Secondary | ICD-10-CM | POA: Diagnosis not present

## 2021-04-16 DIAGNOSIS — M6281 Muscle weakness (generalized): Secondary | ICD-10-CM | POA: Diagnosis not present

## 2021-04-16 DIAGNOSIS — R2681 Unsteadiness on feet: Secondary | ICD-10-CM | POA: Diagnosis not present

## 2021-04-17 DIAGNOSIS — M6281 Muscle weakness (generalized): Secondary | ICD-10-CM | POA: Diagnosis not present

## 2021-04-17 DIAGNOSIS — R262 Difficulty in walking, not elsewhere classified: Secondary | ICD-10-CM | POA: Diagnosis not present

## 2021-04-17 DIAGNOSIS — R2681 Unsteadiness on feet: Secondary | ICD-10-CM | POA: Diagnosis not present

## 2021-04-17 DIAGNOSIS — S42354D Nondisplaced comminuted fracture of shaft of humerus, right arm, subsequent encounter for fracture with routine healing: Secondary | ICD-10-CM | POA: Diagnosis not present

## 2021-04-18 DIAGNOSIS — M6281 Muscle weakness (generalized): Secondary | ICD-10-CM | POA: Diagnosis not present

## 2021-04-18 DIAGNOSIS — S42354D Nondisplaced comminuted fracture of shaft of humerus, right arm, subsequent encounter for fracture with routine healing: Secondary | ICD-10-CM | POA: Diagnosis not present

## 2021-04-18 DIAGNOSIS — R2681 Unsteadiness on feet: Secondary | ICD-10-CM | POA: Diagnosis not present

## 2021-04-18 DIAGNOSIS — R262 Difficulty in walking, not elsewhere classified: Secondary | ICD-10-CM | POA: Diagnosis not present

## 2021-04-20 DIAGNOSIS — R262 Difficulty in walking, not elsewhere classified: Secondary | ICD-10-CM | POA: Diagnosis not present

## 2021-04-20 DIAGNOSIS — R2681 Unsteadiness on feet: Secondary | ICD-10-CM | POA: Diagnosis not present

## 2021-04-20 DIAGNOSIS — S42354D Nondisplaced comminuted fracture of shaft of humerus, right arm, subsequent encounter for fracture with routine healing: Secondary | ICD-10-CM | POA: Diagnosis not present

## 2021-04-20 DIAGNOSIS — M6281 Muscle weakness (generalized): Secondary | ICD-10-CM | POA: Diagnosis not present

## 2021-04-21 DIAGNOSIS — R2681 Unsteadiness on feet: Secondary | ICD-10-CM | POA: Diagnosis not present

## 2021-04-21 DIAGNOSIS — R262 Difficulty in walking, not elsewhere classified: Secondary | ICD-10-CM | POA: Diagnosis not present

## 2021-04-21 DIAGNOSIS — M6281 Muscle weakness (generalized): Secondary | ICD-10-CM | POA: Diagnosis not present

## 2021-04-21 DIAGNOSIS — S42354D Nondisplaced comminuted fracture of shaft of humerus, right arm, subsequent encounter for fracture with routine healing: Secondary | ICD-10-CM | POA: Diagnosis not present

## 2021-04-22 DIAGNOSIS — R262 Difficulty in walking, not elsewhere classified: Secondary | ICD-10-CM | POA: Diagnosis not present

## 2021-04-22 DIAGNOSIS — M6281 Muscle weakness (generalized): Secondary | ICD-10-CM | POA: Diagnosis not present

## 2021-04-22 DIAGNOSIS — R2681 Unsteadiness on feet: Secondary | ICD-10-CM | POA: Diagnosis not present

## 2021-04-22 DIAGNOSIS — S42354D Nondisplaced comminuted fracture of shaft of humerus, right arm, subsequent encounter for fracture with routine healing: Secondary | ICD-10-CM | POA: Diagnosis not present

## 2021-04-25 DIAGNOSIS — R262 Difficulty in walking, not elsewhere classified: Secondary | ICD-10-CM | POA: Diagnosis not present

## 2021-04-25 DIAGNOSIS — M6281 Muscle weakness (generalized): Secondary | ICD-10-CM | POA: Diagnosis not present

## 2021-04-25 DIAGNOSIS — S42354D Nondisplaced comminuted fracture of shaft of humerus, right arm, subsequent encounter for fracture with routine healing: Secondary | ICD-10-CM | POA: Diagnosis not present

## 2021-04-25 DIAGNOSIS — R2681 Unsteadiness on feet: Secondary | ICD-10-CM | POA: Diagnosis not present

## 2021-04-27 DIAGNOSIS — Z9889 Other specified postprocedural states: Secondary | ICD-10-CM | POA: Diagnosis not present

## 2021-04-27 DIAGNOSIS — M6281 Muscle weakness (generalized): Secondary | ICD-10-CM | POA: Diagnosis not present

## 2021-04-27 DIAGNOSIS — Z8781 Personal history of (healed) traumatic fracture: Secondary | ICD-10-CM | POA: Diagnosis not present

## 2021-04-27 DIAGNOSIS — M25511 Pain in right shoulder: Secondary | ICD-10-CM | POA: Diagnosis not present

## 2021-04-27 DIAGNOSIS — C73 Malignant neoplasm of thyroid gland: Secondary | ICD-10-CM | POA: Diagnosis not present

## 2021-04-27 DIAGNOSIS — M25521 Pain in right elbow: Secondary | ICD-10-CM | POA: Diagnosis not present

## 2021-04-27 DIAGNOSIS — M109 Gout, unspecified: Secondary | ICD-10-CM | POA: Diagnosis not present

## 2021-04-27 DIAGNOSIS — R2681 Unsteadiness on feet: Secondary | ICD-10-CM | POA: Diagnosis not present

## 2021-04-27 DIAGNOSIS — R262 Difficulty in walking, not elsewhere classified: Secondary | ICD-10-CM | POA: Diagnosis not present

## 2021-04-27 DIAGNOSIS — M79601 Pain in right arm: Secondary | ICD-10-CM | POA: Diagnosis not present

## 2021-04-27 DIAGNOSIS — S42354D Nondisplaced comminuted fracture of shaft of humerus, right arm, subsequent encounter for fracture with routine healing: Secondary | ICD-10-CM | POA: Diagnosis not present

## 2021-04-27 DIAGNOSIS — R600 Localized edema: Secondary | ICD-10-CM | POA: Diagnosis not present

## 2021-04-27 DIAGNOSIS — K5900 Constipation, unspecified: Secondary | ICD-10-CM | POA: Diagnosis not present

## 2021-04-27 DIAGNOSIS — K921 Melena: Secondary | ICD-10-CM | POA: Diagnosis not present

## 2021-04-27 DIAGNOSIS — R5381 Other malaise: Secondary | ICD-10-CM | POA: Diagnosis not present

## 2021-04-27 DIAGNOSIS — E785 Hyperlipidemia, unspecified: Secondary | ICD-10-CM | POA: Diagnosis not present

## 2021-04-27 DIAGNOSIS — D508 Other iron deficiency anemias: Secondary | ICD-10-CM | POA: Diagnosis not present

## 2021-04-27 DIAGNOSIS — R1310 Dysphagia, unspecified: Secondary | ICD-10-CM | POA: Diagnosis not present

## 2021-04-27 DIAGNOSIS — M79604 Pain in right leg: Secondary | ICD-10-CM | POA: Diagnosis not present

## 2021-04-28 DIAGNOSIS — R601 Generalized edema: Secondary | ICD-10-CM | POA: Diagnosis not present

## 2021-04-28 DIAGNOSIS — F445 Conversion disorder with seizures or convulsions: Secondary | ICD-10-CM | POA: Diagnosis not present

## 2021-04-28 DIAGNOSIS — R2681 Unsteadiness on feet: Secondary | ICD-10-CM | POA: Diagnosis not present

## 2021-04-28 DIAGNOSIS — G8929 Other chronic pain: Secondary | ICD-10-CM | POA: Diagnosis not present

## 2021-04-28 DIAGNOSIS — G20 Parkinson's disease: Secondary | ICD-10-CM | POA: Diagnosis not present

## 2021-04-28 DIAGNOSIS — F321 Major depressive disorder, single episode, moderate: Secondary | ICD-10-CM | POA: Diagnosis not present

## 2021-04-28 DIAGNOSIS — M6281 Muscle weakness (generalized): Secondary | ICD-10-CM | POA: Diagnosis not present

## 2021-04-28 DIAGNOSIS — F29 Unspecified psychosis not due to a substance or known physiological condition: Secondary | ICD-10-CM | POA: Diagnosis not present

## 2021-04-28 DIAGNOSIS — R262 Difficulty in walking, not elsewhere classified: Secondary | ICD-10-CM | POA: Diagnosis not present

## 2021-04-28 DIAGNOSIS — S42354D Nondisplaced comminuted fracture of shaft of humerus, right arm, subsequent encounter for fracture with routine healing: Secondary | ICD-10-CM | POA: Diagnosis not present

## 2021-04-29 DIAGNOSIS — R2681 Unsteadiness on feet: Secondary | ICD-10-CM | POA: Diagnosis not present

## 2021-04-29 DIAGNOSIS — R262 Difficulty in walking, not elsewhere classified: Secondary | ICD-10-CM | POA: Diagnosis not present

## 2021-04-29 DIAGNOSIS — M6281 Muscle weakness (generalized): Secondary | ICD-10-CM | POA: Diagnosis not present

## 2021-04-29 DIAGNOSIS — S42354D Nondisplaced comminuted fracture of shaft of humerus, right arm, subsequent encounter for fracture with routine healing: Secondary | ICD-10-CM | POA: Diagnosis not present

## 2021-04-30 DIAGNOSIS — M6281 Muscle weakness (generalized): Secondary | ICD-10-CM | POA: Diagnosis not present

## 2021-04-30 DIAGNOSIS — S42354D Nondisplaced comminuted fracture of shaft of humerus, right arm, subsequent encounter for fracture with routine healing: Secondary | ICD-10-CM | POA: Diagnosis not present

## 2021-04-30 DIAGNOSIS — R262 Difficulty in walking, not elsewhere classified: Secondary | ICD-10-CM | POA: Diagnosis not present

## 2021-04-30 DIAGNOSIS — R2681 Unsteadiness on feet: Secondary | ICD-10-CM | POA: Diagnosis not present

## 2021-05-01 DIAGNOSIS — R2681 Unsteadiness on feet: Secondary | ICD-10-CM | POA: Diagnosis not present

## 2021-05-01 DIAGNOSIS — M6281 Muscle weakness (generalized): Secondary | ICD-10-CM | POA: Diagnosis not present

## 2021-05-01 DIAGNOSIS — R262 Difficulty in walking, not elsewhere classified: Secondary | ICD-10-CM | POA: Diagnosis not present

## 2021-05-01 DIAGNOSIS — S42354D Nondisplaced comminuted fracture of shaft of humerus, right arm, subsequent encounter for fracture with routine healing: Secondary | ICD-10-CM | POA: Diagnosis not present

## 2021-05-04 DIAGNOSIS — M6281 Muscle weakness (generalized): Secondary | ICD-10-CM | POA: Diagnosis not present

## 2021-05-04 DIAGNOSIS — R262 Difficulty in walking, not elsewhere classified: Secondary | ICD-10-CM | POA: Diagnosis not present

## 2021-05-04 DIAGNOSIS — S42354D Nondisplaced comminuted fracture of shaft of humerus, right arm, subsequent encounter for fracture with routine healing: Secondary | ICD-10-CM | POA: Diagnosis not present

## 2021-05-04 DIAGNOSIS — R2681 Unsteadiness on feet: Secondary | ICD-10-CM | POA: Diagnosis not present

## 2021-05-05 DIAGNOSIS — S42354D Nondisplaced comminuted fracture of shaft of humerus, right arm, subsequent encounter for fracture with routine healing: Secondary | ICD-10-CM | POA: Diagnosis not present

## 2021-05-05 DIAGNOSIS — R262 Difficulty in walking, not elsewhere classified: Secondary | ICD-10-CM | POA: Diagnosis not present

## 2021-05-05 DIAGNOSIS — M6281 Muscle weakness (generalized): Secondary | ICD-10-CM | POA: Diagnosis not present

## 2021-05-05 DIAGNOSIS — R2681 Unsteadiness on feet: Secondary | ICD-10-CM | POA: Diagnosis not present

## 2021-05-06 DIAGNOSIS — R2681 Unsteadiness on feet: Secondary | ICD-10-CM | POA: Diagnosis not present

## 2021-05-06 DIAGNOSIS — R262 Difficulty in walking, not elsewhere classified: Secondary | ICD-10-CM | POA: Diagnosis not present

## 2021-05-06 DIAGNOSIS — S42354D Nondisplaced comminuted fracture of shaft of humerus, right arm, subsequent encounter for fracture with routine healing: Secondary | ICD-10-CM | POA: Diagnosis not present

## 2021-05-06 DIAGNOSIS — M6281 Muscle weakness (generalized): Secondary | ICD-10-CM | POA: Diagnosis not present

## 2021-05-07 ENCOUNTER — Ambulatory Visit: Payer: Medicare Other | Admitting: Gastroenterology

## 2021-05-07 DIAGNOSIS — M6281 Muscle weakness (generalized): Secondary | ICD-10-CM | POA: Diagnosis not present

## 2021-05-07 DIAGNOSIS — S42354D Nondisplaced comminuted fracture of shaft of humerus, right arm, subsequent encounter for fracture with routine healing: Secondary | ICD-10-CM | POA: Diagnosis not present

## 2021-05-07 DIAGNOSIS — R262 Difficulty in walking, not elsewhere classified: Secondary | ICD-10-CM | POA: Diagnosis not present

## 2021-05-07 DIAGNOSIS — R2681 Unsteadiness on feet: Secondary | ICD-10-CM | POA: Diagnosis not present

## 2021-05-08 DIAGNOSIS — S42354D Nondisplaced comminuted fracture of shaft of humerus, right arm, subsequent encounter for fracture with routine healing: Secondary | ICD-10-CM | POA: Diagnosis not present

## 2021-05-08 DIAGNOSIS — R262 Difficulty in walking, not elsewhere classified: Secondary | ICD-10-CM | POA: Diagnosis not present

## 2021-05-08 DIAGNOSIS — R2681 Unsteadiness on feet: Secondary | ICD-10-CM | POA: Diagnosis not present

## 2021-05-08 DIAGNOSIS — M6281 Muscle weakness (generalized): Secondary | ICD-10-CM | POA: Diagnosis not present

## 2021-05-09 DIAGNOSIS — S42354D Nondisplaced comminuted fracture of shaft of humerus, right arm, subsequent encounter for fracture with routine healing: Secondary | ICD-10-CM | POA: Diagnosis not present

## 2021-05-09 DIAGNOSIS — R2681 Unsteadiness on feet: Secondary | ICD-10-CM | POA: Diagnosis not present

## 2021-05-09 DIAGNOSIS — R262 Difficulty in walking, not elsewhere classified: Secondary | ICD-10-CM | POA: Diagnosis not present

## 2021-05-09 DIAGNOSIS — M6281 Muscle weakness (generalized): Secondary | ICD-10-CM | POA: Diagnosis not present

## 2021-05-11 DIAGNOSIS — M6281 Muscle weakness (generalized): Secondary | ICD-10-CM | POA: Diagnosis not present

## 2021-05-11 DIAGNOSIS — S42354D Nondisplaced comminuted fracture of shaft of humerus, right arm, subsequent encounter for fracture with routine healing: Secondary | ICD-10-CM | POA: Diagnosis not present

## 2021-05-11 DIAGNOSIS — R262 Difficulty in walking, not elsewhere classified: Secondary | ICD-10-CM | POA: Diagnosis not present

## 2021-05-11 DIAGNOSIS — R2681 Unsteadiness on feet: Secondary | ICD-10-CM | POA: Diagnosis not present

## 2021-05-12 ENCOUNTER — Ambulatory Visit: Payer: Medicare Other | Admitting: Neurology

## 2021-05-13 DIAGNOSIS — R262 Difficulty in walking, not elsewhere classified: Secondary | ICD-10-CM | POA: Diagnosis not present

## 2021-05-13 DIAGNOSIS — R2681 Unsteadiness on feet: Secondary | ICD-10-CM | POA: Diagnosis not present

## 2021-05-13 DIAGNOSIS — M6281 Muscle weakness (generalized): Secondary | ICD-10-CM | POA: Diagnosis not present

## 2021-05-13 DIAGNOSIS — S42354D Nondisplaced comminuted fracture of shaft of humerus, right arm, subsequent encounter for fracture with routine healing: Secondary | ICD-10-CM | POA: Diagnosis not present

## 2021-05-14 DIAGNOSIS — R262 Difficulty in walking, not elsewhere classified: Secondary | ICD-10-CM | POA: Diagnosis not present

## 2021-05-14 DIAGNOSIS — R2681 Unsteadiness on feet: Secondary | ICD-10-CM | POA: Diagnosis not present

## 2021-05-14 DIAGNOSIS — S42354D Nondisplaced comminuted fracture of shaft of humerus, right arm, subsequent encounter for fracture with routine healing: Secondary | ICD-10-CM | POA: Diagnosis not present

## 2021-05-14 DIAGNOSIS — M6281 Muscle weakness (generalized): Secondary | ICD-10-CM | POA: Diagnosis not present

## 2021-05-15 DIAGNOSIS — R262 Difficulty in walking, not elsewhere classified: Secondary | ICD-10-CM | POA: Diagnosis not present

## 2021-05-15 DIAGNOSIS — M6281 Muscle weakness (generalized): Secondary | ICD-10-CM | POA: Diagnosis not present

## 2021-05-15 DIAGNOSIS — S42354D Nondisplaced comminuted fracture of shaft of humerus, right arm, subsequent encounter for fracture with routine healing: Secondary | ICD-10-CM | POA: Diagnosis not present

## 2021-05-15 DIAGNOSIS — R2681 Unsteadiness on feet: Secondary | ICD-10-CM | POA: Diagnosis not present

## 2021-05-16 DIAGNOSIS — M6281 Muscle weakness (generalized): Secondary | ICD-10-CM | POA: Diagnosis not present

## 2021-05-16 DIAGNOSIS — R262 Difficulty in walking, not elsewhere classified: Secondary | ICD-10-CM | POA: Diagnosis not present

## 2021-05-16 DIAGNOSIS — S42354D Nondisplaced comminuted fracture of shaft of humerus, right arm, subsequent encounter for fracture with routine healing: Secondary | ICD-10-CM | POA: Diagnosis not present

## 2021-05-16 DIAGNOSIS — R2681 Unsteadiness on feet: Secondary | ICD-10-CM | POA: Diagnosis not present

## 2021-05-18 ENCOUNTER — Ambulatory Visit: Payer: Medicare Other | Admitting: Neurology

## 2021-05-19 DIAGNOSIS — D649 Anemia, unspecified: Secondary | ICD-10-CM | POA: Diagnosis not present

## 2021-05-19 DIAGNOSIS — R2681 Unsteadiness on feet: Secondary | ICD-10-CM | POA: Diagnosis not present

## 2021-05-19 DIAGNOSIS — S42354D Nondisplaced comminuted fracture of shaft of humerus, right arm, subsequent encounter for fracture with routine healing: Secondary | ICD-10-CM | POA: Diagnosis not present

## 2021-05-19 DIAGNOSIS — R262 Difficulty in walking, not elsewhere classified: Secondary | ICD-10-CM | POA: Diagnosis not present

## 2021-05-19 DIAGNOSIS — M6281 Muscle weakness (generalized): Secondary | ICD-10-CM | POA: Diagnosis not present

## 2021-05-19 DIAGNOSIS — I82621 Acute embolism and thrombosis of deep veins of right upper extremity: Secondary | ICD-10-CM | POA: Diagnosis not present

## 2021-05-20 DIAGNOSIS — M6281 Muscle weakness (generalized): Secondary | ICD-10-CM | POA: Diagnosis not present

## 2021-05-20 DIAGNOSIS — S42354D Nondisplaced comminuted fracture of shaft of humerus, right arm, subsequent encounter for fracture with routine healing: Secondary | ICD-10-CM | POA: Diagnosis not present

## 2021-05-20 DIAGNOSIS — R262 Difficulty in walking, not elsewhere classified: Secondary | ICD-10-CM | POA: Diagnosis not present

## 2021-05-20 DIAGNOSIS — R2681 Unsteadiness on feet: Secondary | ICD-10-CM | POA: Diagnosis not present

## 2021-05-21 DIAGNOSIS — R262 Difficulty in walking, not elsewhere classified: Secondary | ICD-10-CM | POA: Diagnosis not present

## 2021-05-21 DIAGNOSIS — M6281 Muscle weakness (generalized): Secondary | ICD-10-CM | POA: Diagnosis not present

## 2021-05-21 DIAGNOSIS — R2681 Unsteadiness on feet: Secondary | ICD-10-CM | POA: Diagnosis not present

## 2021-05-21 DIAGNOSIS — S42354D Nondisplaced comminuted fracture of shaft of humerus, right arm, subsequent encounter for fracture with routine healing: Secondary | ICD-10-CM | POA: Diagnosis not present

## 2021-05-22 DIAGNOSIS — M6281 Muscle weakness (generalized): Secondary | ICD-10-CM | POA: Diagnosis not present

## 2021-05-22 DIAGNOSIS — R262 Difficulty in walking, not elsewhere classified: Secondary | ICD-10-CM | POA: Diagnosis not present

## 2021-05-22 DIAGNOSIS — R2681 Unsteadiness on feet: Secondary | ICD-10-CM | POA: Diagnosis not present

## 2021-05-22 DIAGNOSIS — S42354D Nondisplaced comminuted fracture of shaft of humerus, right arm, subsequent encounter for fracture with routine healing: Secondary | ICD-10-CM | POA: Diagnosis not present

## 2021-05-23 DIAGNOSIS — R262 Difficulty in walking, not elsewhere classified: Secondary | ICD-10-CM | POA: Diagnosis not present

## 2021-05-23 DIAGNOSIS — S42354D Nondisplaced comminuted fracture of shaft of humerus, right arm, subsequent encounter for fracture with routine healing: Secondary | ICD-10-CM | POA: Diagnosis not present

## 2021-05-23 DIAGNOSIS — M6281 Muscle weakness (generalized): Secondary | ICD-10-CM | POA: Diagnosis not present

## 2021-05-23 DIAGNOSIS — R2681 Unsteadiness on feet: Secondary | ICD-10-CM | POA: Diagnosis not present

## 2021-05-25 ENCOUNTER — Other Ambulatory Visit: Payer: Self-pay

## 2021-05-25 DIAGNOSIS — R2681 Unsteadiness on feet: Secondary | ICD-10-CM | POA: Diagnosis not present

## 2021-05-25 DIAGNOSIS — G8929 Other chronic pain: Secondary | ICD-10-CM | POA: Diagnosis not present

## 2021-05-25 DIAGNOSIS — E785 Hyperlipidemia, unspecified: Secondary | ICD-10-CM | POA: Diagnosis not present

## 2021-05-25 DIAGNOSIS — M6281 Muscle weakness (generalized): Secondary | ICD-10-CM | POA: Diagnosis not present

## 2021-05-25 DIAGNOSIS — D508 Other iron deficiency anemias: Secondary | ICD-10-CM | POA: Diagnosis not present

## 2021-05-25 DIAGNOSIS — M79604 Pain in right leg: Secondary | ICD-10-CM | POA: Diagnosis not present

## 2021-05-25 DIAGNOSIS — S42354D Nondisplaced comminuted fracture of shaft of humerus, right arm, subsequent encounter for fracture with routine healing: Secondary | ICD-10-CM | POA: Diagnosis not present

## 2021-05-25 DIAGNOSIS — I82621 Acute embolism and thrombosis of deep veins of right upper extremity: Secondary | ICD-10-CM | POA: Diagnosis not present

## 2021-05-25 DIAGNOSIS — E039 Hypothyroidism, unspecified: Secondary | ICD-10-CM | POA: Diagnosis not present

## 2021-05-25 DIAGNOSIS — R262 Difficulty in walking, not elsewhere classified: Secondary | ICD-10-CM | POA: Diagnosis not present

## 2021-05-25 DIAGNOSIS — K5900 Constipation, unspecified: Secondary | ICD-10-CM | POA: Diagnosis not present

## 2021-05-25 DIAGNOSIS — I10 Essential (primary) hypertension: Secondary | ICD-10-CM | POA: Diagnosis not present

## 2021-05-25 DIAGNOSIS — K921 Melena: Secondary | ICD-10-CM | POA: Diagnosis not present

## 2021-05-25 DIAGNOSIS — R1310 Dysphagia, unspecified: Secondary | ICD-10-CM | POA: Diagnosis not present

## 2021-05-26 ENCOUNTER — Other Ambulatory Visit: Payer: Self-pay

## 2021-05-26 ENCOUNTER — Ambulatory Visit (INDEPENDENT_AMBULATORY_CARE_PROVIDER_SITE_OTHER): Payer: Medicare Other | Admitting: Neurology

## 2021-05-26 ENCOUNTER — Encounter: Payer: Self-pay | Admitting: Neurology

## 2021-05-26 VITALS — BP 123/81 | HR 70 | Ht 65.5 in | Wt 255.0 lb

## 2021-05-26 DIAGNOSIS — F29 Unspecified psychosis not due to a substance or known physiological condition: Secondary | ICD-10-CM | POA: Diagnosis not present

## 2021-05-26 DIAGNOSIS — G8929 Other chronic pain: Secondary | ICD-10-CM | POA: Diagnosis not present

## 2021-05-26 DIAGNOSIS — M6281 Muscle weakness (generalized): Secondary | ICD-10-CM | POA: Diagnosis not present

## 2021-05-26 DIAGNOSIS — S42354D Nondisplaced comminuted fracture of shaft of humerus, right arm, subsequent encounter for fracture with routine healing: Secondary | ICD-10-CM | POA: Diagnosis not present

## 2021-05-26 DIAGNOSIS — D508 Other iron deficiency anemias: Secondary | ICD-10-CM | POA: Diagnosis not present

## 2021-05-26 DIAGNOSIS — R2681 Unsteadiness on feet: Secondary | ICD-10-CM | POA: Diagnosis not present

## 2021-05-26 DIAGNOSIS — F445 Conversion disorder with seizures or convulsions: Secondary | ICD-10-CM | POA: Diagnosis not present

## 2021-05-26 DIAGNOSIS — R296 Repeated falls: Secondary | ICD-10-CM | POA: Diagnosis not present

## 2021-05-26 DIAGNOSIS — R262 Difficulty in walking, not elsewhere classified: Secondary | ICD-10-CM | POA: Diagnosis not present

## 2021-05-26 DIAGNOSIS — R011 Cardiac murmur, unspecified: Secondary | ICD-10-CM | POA: Diagnosis not present

## 2021-05-26 DIAGNOSIS — G20 Parkinson's disease: Secondary | ICD-10-CM | POA: Diagnosis not present

## 2021-05-26 DIAGNOSIS — E785 Hyperlipidemia, unspecified: Secondary | ICD-10-CM | POA: Diagnosis not present

## 2021-05-26 DIAGNOSIS — F321 Major depressive disorder, single episode, moderate: Secondary | ICD-10-CM | POA: Diagnosis not present

## 2021-05-26 DIAGNOSIS — I10 Essential (primary) hypertension: Secondary | ICD-10-CM | POA: Diagnosis not present

## 2021-05-26 DIAGNOSIS — E039 Hypothyroidism, unspecified: Secondary | ICD-10-CM | POA: Diagnosis not present

## 2021-05-26 NOTE — Progress Notes (Signed)
[facility]  PATIENT: Renee Davidson DOB: 27-Jun-1951  REFERRING CLINICIAN: Verl Blalock, NP HISTORY FROM: Patient and sister  REASON FOR VISIT: Multiple falls, need clearance for surgery    HISTORICAL  CHIEF COMPLAINT:  Chief Complaint  Patient presents with   New Patient (Initial Visit)    Rm 6, with sister, states they are concerned about her multiple falls, using walker, states this started last summer 2021,     HISTORY OF PRESENT ILLNESS:  This is a 70 year old woman with multiple medical conditions including hypertension, hypothyroidism, hyperlipidemia, generalized anxiety, depression, GERD, recent fracture of the shaft of the right humerus and recent fracture of the left fibula s/p falls who is presenting with multiple falls.  Per sister patient fell and broke her arm on January 10, she had a metal plate inserted but the arm is not healing properly and they are in the process of replacing the metal plate.  2 months ago she had another fall and broken her left fibula.  It is unclear why she is falling, she has seen cardiologist, pulmonologist and they could not find etiology for fall.  Patient denies any trip and fall, denies feeling lightheaded or dizzy prior to fall she reported that she just hit the ground not knowing why.  Denies any abnormal movement denies any confusion afterward, she also denies any previous history of seizures. For her upcoming right arm surgery she was told that she needs neurological clearance due to the fact that she had a bad reaction to anesthesia after for surgery.  The bad reaction was described as confusion, difficult to arouse status post anesthesia and taking a few hours for patient to come back to her baseline, no abnormal movement described.    OTHER MEDICAL CONDITIONS: hypertension, hypothyroidism, hyperlipidemia, generalized anxiety, depression, GERD, recent fracture of the shaft of the right humerus and recent fracture of  the left fibula   REVIEW OF SYSTEMS: Full 14 system review of systems performed and negative with exception of: As noted in the HPI  ALLERGIES: Allergies  Allergen Reactions   Other Other (See Comments)    Cefalexin (Kelfex)    Keflex [Cephalexin] Other (See Comments)    Dizzy, lightheaded, falls   Doxycycline Rash    HOME MEDICATIONS: Outpatient Medications Prior to Visit  Medication Sig Dispense Refill   acetaminophen (TYLENOL) 325 MG tablet Take 650 mg by mouth every 6 (six) hours as needed.     albuterol (VENTOLIN HFA) 108 (90 Base) MCG/ACT inhaler Inhale 1-2 puffs into the lungs every 6 (six) hours as needed for wheezing or shortness of breath. 18 g 5   ascorbic acid (VITAMIN C) 500 MG tablet Take 1 tablet (500 mg total) by mouth daily. 30 tablet 5   aspirin 325 MG tablet Take 325 mg by mouth daily.     benzonatate (TESSALON PERLES) 100 MG capsule Take 1 capsule (100 mg total) by mouth 3 (three) times daily as needed for cough. 20 capsule 0   Calcium-Vitamin D-Vitamin K 500-100-40 MG-UNT-MCG CHEW Chew 1 tablet by mouth 2 (two) times daily.     dextromethorphan-guaiFENesin (MUCINEX DM) 30-600 MG 12hr tablet Take 1 tablet by mouth 2 (two) times daily.     escitalopram (LEXAPRO) 20 MG tablet Take 1 tablet (20 mg total) by mouth daily. 90 tablet 1   ferrous sulfate 325 (65 FE) MG tablet Take 1 tablet (325 mg total) by mouth daily with breakfast. 90 tablet 1   fluconazole (DIFLUCAN) 150  MG tablet Take 1 tablet (150 mg total) by mouth once a week. 2 tablet 0   levothyroxine (SYNTHROID) 50 MCG tablet Take 1 tablet (50 mcg total) by mouth daily before breakfast. 90 tablet 1   LORazepam (ATIVAN) 0.5 MG tablet Take 1 tablet (0.5 mg total) by mouth 3 (three) times daily. (Patient taking differently: Take 0.5 mg by mouth 2 (two) times daily.) 90 tablet 5   LORazepam (ATIVAN) 2 MG/ML injection      lovastatin (MEVACOR) 40 MG tablet Take 1 tablet (40 mg total) by mouth at bedtime. 90 tablet 1    nystatin (MYCOSTATIN/NYSTOP) powder Apply 1 application topically 3 (three) times daily. 60 g 3   nystatin cream (MYCOSTATIN) Apply to affected area 2 times daily 30 g 5   OLANZapine (ZYPREXA) 2.5 MG tablet Take 2.5 mg by mouth at bedtime.     ondansetron (ZOFRAN) 4 MG tablet Take 1 tablet (4 mg total) by mouth every 6 (six) hours as needed for nausea. 20 tablet 0   pantoprazole (PROTONIX) 40 MG tablet TAKE 1 TABLET DAILY 90 tablet 0   polyvinyl alcohol (LIQUIFILM TEARS) 1.4 % ophthalmic solution 1 drop as needed for dry eyes.     risperiDONE (RISPERDAL) 0.25 MG tablet TAKE 1 TABLET IN THE MORNING AND 2 TABLETS AT BEDTIME (Patient taking differently: TAKE 1 TABLET IN THE MORNING AND 1 TABLETS AT BEDTIME) 270 tablet 1   XARELTO 15 MG TABS tablet Take 15 mg by mouth 2 (two) times daily.     No facility-administered medications prior to visit.    PAST MEDICAL HISTORY: Past Medical History:  Diagnosis Date   Anxiety    Depression    GERD (gastroesophageal reflux disease)    Hyperlipidemia    Mental developmental delay    OCD (obsessive compulsive disorder)    Osteoporosis    Thyroid disease     PAST SURGICAL HISTORY: Past Surgical History:  Procedure Laterality Date   BIOPSY  11/02/2019   Procedure: BIOPSY;  Surgeon: Gatha Mayer, MD;  Location: [facility];  Service: Endoscopy;;   COLONOSCOPY WITH PROPOFOL N/A 11/02/2019   Procedure: COLONOSCOPY WITH PROPOFOL;  Surgeon: Gatha Mayer, MD;  Location: [facility];  Service: Endoscopy;  Laterality: N/A;   ESOPHAGOGASTRODUODENOSCOPY (EGD) WITH PROPOFOL N/A 11/01/2019   Procedure: ESOPHAGOGASTRODUODENOSCOPY (EGD) WITH PROPOFOL;  Surgeon: Gatha Mayer, MD;  Location: [facility];  Service: Endoscopy;  Laterality: N/A;   TONSILLECTOMY      FAMILY HISTORY: Family History  Problem Relation Age of Onset   Diabetes Father    Hyperlipidemia Father    Hypertension Father    Atrial fibrillation Father    Heart attack Father     Osteoporosis Father    Hyperlipidemia Sister    Dysphagia Sister    GI problems Mother    Hypertension Mother    Hyperlipidemia Mother    Heart disease Maternal Grandfather    Colon cancer Maternal Grandfather     SOCIAL HISTORY: Social History   Socioeconomic History   Marital status: Single    Spouse name: Not on file   Number of children: 0   Years of education: 12   Highest education level: 12th grade  Occupational History   Occupation: retired    Comment: Special educational needs teacher  Tobacco Use   Smoking status: Never   Smokeless tobacco: Never  Vaping Use   Vaping Use: Never used  Substance and Sexual Activity   Alcohol use: No   Drug use:  No   Sexual activity: Never  Other Topics Concern   Not on file  Social History Narrative   Not on file   Social Determinants of Health   Financial Resource Strain: Not on file  Food Insecurity: Not on file  Transportation Needs: Not on file  Physical Activity: Not on file  Stress: Not on file  Social Connections: Not on file  Intimate Partner Violence: Not on file     PHYSICAL EXAM  GENERAL EXAM/CONSTITUTIONAL: Vitals:  Vitals:   05/26/21 1058  BP: 123/81  Pulse: 70  Weight: 255 lb (115.7 kg)  Height: 5' 5.5" (1.664 m)   Body mass index is 41.79 kg/m. Wt Readings from Last 3 Encounters:  05/26/21 255 lb (115.7 kg)  12/24/20 237 lb 11.2 oz (107.8 kg)  11/10/20 224 lb (101.6 kg)   Patient is in no distress; well developed, nourished and groomed; neck is supple  EYES: Pupils round and reactive to light, Visual fields full to confrontation, Extraocular movements intacts,   MUSCULOSKELETAL: Gait, strength, tone, movements noted in Neurologic exam below  NEUROLOGIC: MENTAL STATUS:  MMSE - [place] Exam 07/04/2017 06/30/2016 05/19/2015  Orientation to time 2 4 2   Orientation to Place 2 4 1   Registration 3 3 3   Attention/ Calculation 3 4 4   Recall 1 2 0  Language- name 2 objects 2 2 2   Language- repeat 1 1  1   Language- follow 3 step command 3 3 3   Language- read & follow direction 1 1 1   Write a sentence 1 1 1   Copy design 1 0 0  Total score 20 25 18    awake, alert, oriented to person, place and time recent and remote memory intact normal attention and concentration language fluent, comprehension intact, naming intact fund of knowledge appropriate  CRANIAL NERVE:  2nd, 3rd, 4th, 6th - pupils equal and reactive to light, visual fields full to confrontation, extraocular muscles intact, no nystagmus 5th - facial sensation symmetric 7th - facial strength symmetric 8th - hearing intact 9th - palate elevates symmetrically, uvula midline 11th - shoulder shrug symmetric 12th - tongue protrusion midline  MOTOR:  normal bulk and tone, at least antigravity, RUE is limited by pain.   SENSORY:  normal and symmetric to light touch  COORDINATION:  finger-nose-finger normal on the left   REFLEXES:  deep tendon reflexes present and symmetric  GAIT/STATION:  Walks with a walker, small strides but no shuffling or magnetic gait noted.      DIAGNOSTIC DATA (LABS, IMAGING, TESTING) - I reviewed patient records, labs, notes, testing and imaging myself where available.  Lab Results  Component Value Date   WBC 7.5 07/10/2020   HGB 13.7 07/10/2020   HCT 42.2 07/10/2020   MCV 94.6 07/10/2020   PLT 246 07/10/2020      Component Value Date/Time   NA 139 07/10/2020 1942   NA 139 02/22/2020 1252   K 4.3 07/10/2020 1942   CL 105 07/10/2020 1942   CO2 23 07/10/2020 1942   GLUCOSE 92 07/10/2020 1942   BUN 14 07/10/2020 1942   BUN 11 02/22/2020 1252   CREATININE 0.90 07/10/2020 1942   CALCIUM 9.1 07/10/2020 1942   PROT 7.6 07/10/2020 1942   PROT 6.6 02/22/2020 1252   ALBUMIN 4.0 07/10/2020 1942   ALBUMIN 4.0 02/22/2020 1252   AST 20 07/10/2020 1942   ALT 16 07/10/2020 1942   ALKPHOS 109 07/10/2020 1942   BILITOT 0.6 07/10/2020 1942   BILITOT 0.3  02/22/2020 1252   GFRNONAA >60  07/10/2020 1942   GFRAA >60 04/19/2020 1219   Lab Results  Component Value Date   CHOL 163 02/22/2020   HDL 53 02/22/2020   LDLCALC 96 02/22/2020   TRIG 74 02/22/2020   CHOLHDL 3.1 02/22/2020   Lab Results  Component Value Date   HGBA1C 5.7 06/08/2017   Lab Results  Component Value Date   SWNIOEVO35 009 10/31/2019   Lab Results  Component Value Date   TSH 0.893 02/22/2020    CT Head 05/2020 1. No acute intracranial abnormality. 2. Left supraorbital and periorbital soft tissue swelling with palpebral thickening. No calvarial fracture. No globe injury. No visible facial bone fracture within the included margins of imaging though portions of the orbits and remaining facial bones are excluded on this exam. If there is persisting clinical concern, maxillofacial CT should be obtained. 3. Asymmetric anterior positioning of the right temporomandibular joint, nonspecific. Correlate with clinical exam features and for tenderness at the right temporomandibular joint. 4. Mild parenchymal volume loss and chronic microvascular angiopathy.    ASSESSMENT AND PLAN  70 y.o. year old female with multiple medical conditions including hypertension, hypothyroidism, hyperlipidemia, generalized anxiety, depression, GERD, recent fracture of the shaft of the right humerus and recent fracture of the left fibula s/p falls who is presenting with multiple falls.  She has seen cardiology and pulmonology and could not find the etiology of the falls.  She denies any mechanical fall and denies any prodrome prior to the fall.  She also denies any abnormal movements and any confusion after the fall.  She did have 2 fractures resulting from the falls.  At this point the etiology of the falls is unclear but since completing physical therapy and using a walker she has not had any additional falls.  I have advised patient and sister to use the walker from now on.  Have also explained to them that due to her weight and her  previous reaction to anesthesia she may have additional reaction to anesthesia meaning delayed recovery but that should not be an absolute contraindication to her surgery.  Awaiting formal clearance request from surgery.  Return if worse.   1. Falls frequently     PLAN: Follow up your primary care doctor  Return if worse or if additional concerns regarding memory.   No orders of the defined types were placed in this encounter.   No orders of the defined types were placed in this encounter.   Return if symptoms worsen or fail to improve.    Alric Ran, MD 05/26/2021, 9:14 PM  [facility] [address] [place], [place] [address] [phone]

## 2021-05-26 NOTE — Patient Instructions (Addendum)
Follow up your primary care doctor  Return if worse or if additional concerns regarding memory.

## 2021-05-27 ENCOUNTER — Encounter: Payer: Self-pay | Admitting: Gastroenterology

## 2021-05-27 ENCOUNTER — Ambulatory Visit (INDEPENDENT_AMBULATORY_CARE_PROVIDER_SITE_OTHER): Payer: Medicare Other | Admitting: Gastroenterology

## 2021-05-27 VITALS — BP 122/70 | HR 64 | Ht 64.25 in | Wt 254.0 lb

## 2021-05-27 DIAGNOSIS — R195 Other fecal abnormalities: Secondary | ICD-10-CM

## 2021-05-27 DIAGNOSIS — K257 Chronic gastric ulcer without hemorrhage or perforation: Secondary | ICD-10-CM | POA: Diagnosis not present

## 2021-05-27 DIAGNOSIS — R1319 Other dysphagia: Secondary | ICD-10-CM

## 2021-05-27 DIAGNOSIS — K449 Diaphragmatic hernia without obstruction or gangrene: Secondary | ICD-10-CM | POA: Diagnosis not present

## 2021-05-27 DIAGNOSIS — D649 Anemia, unspecified: Secondary | ICD-10-CM | POA: Diagnosis not present

## 2021-05-27 NOTE — Patient Instructions (Signed)
If you are age 72 or older, your body mass index should be between 23-30. Your Body mass index is 43.26 kg/m. If this is out of the aforementioned range listed, please consider follow up with your Primary Care Provider.  If you are age 43 or younger, your body mass index should be between 19-25. Your Body mass index is 43.26 kg/m. If this is out of the aformentioned range listed, please consider follow up with your Primary Care Provider.   ________________________________________________________  The [facility] providers would like to encourage you to use [facility] to communicate with providers for non-urgent requests or questions.  Due to long hold times on the telephone, sending your provider a message by [facility] may be a faster and more efficient way to get a response.  Please allow 48 business hours for a response.  Please remember that this is for non-urgent requests.  _______________________________________________________  It was a pleasure to see you today!  Thank you for trusting me with your gastrointestinal care!

## 2021-05-27 NOTE — Progress Notes (Addendum)
[facility] Consult Note:  History: Renee Davidson 05/27/2021  Referring provider: Irene Limbo, [facility], [place], [place]  Reason for consult/chief complaint: Anemia (Was in the hospital last year for Hgb of 5), Dysphagia (Having esophageal spasms, getting choked a lot/), and heme positive stool test   Subjective  HPI: From my 03/25/2016 office note: "This is a 70 year old woman referred by primary care after 2 ED visits for dysphagia that may have been food impactions. She has developmental delay and most of the history is given by the 3 family members accompanying her. In April of this year and again in June she had an episode of acute feeling of food stuck in the neck or chest. She was able to breathe, but somewhat gave her the Heimlich maneuver on both occasions. She appeared normal upon arrival to the ED she was able to eat and drink well and was discharged home. Does not sound like there has been chronic dysphagia in between these episodes. Her 54 year old father lives with her and feels like she eats too quickly. Diamonique denies abdominal pain altered bowel habits or rectal bleeding. She has never had a screening colonoscopy, and her family says they have been too concerned that her fear of medical procedures would preclude doing that"  Recommendation after that was for barium study and consideration of upper endoscopy if warranted based on findings.  (Report below)  Renee Davidson was hospitalized April 2021 with severe iron deficiency anemia, underwent EGD and colonoscopy by Dr. Carlean Purl.  Colonoscopy revealed an ileocecal valve ulcer, benign biopsies.  His impression on that report included the following: "- I will follow-up ulcer biopsies and arrange office f/u w/ Dr. Loletha Carrow - primary GI MD I doubt this ulcer is of clinical consequence - is likely incidental and possibly related to prep I think iron deficiency is from large hiatus hernia and  Renee Davidson erosions/ulcers leaking blood chronically 1 dose feraheme today, feSO4 oral at home"  EGD revealed an approximately 10 cm hiatal hernia with Renee Davidson erosion  _____________________________________   Renee Davidson is a limited historian, her sister is with her today and gives much of the history.  She has had ongoing dysphagia and at times still feels like "spasms".  About 6 months ago with 1 episode of things feeling stuck in the chest, staff at the nursing facility apparently tried a Heimlich maneuver, and unfortunately broke some hardware in her right arm, which needs surgical repair.  More unfortunately, she recently developed a clot in the right arm and we were able to find and review a recent hematology consult note with need for [place].  Anemia was also followed with labs as noted below.  Being on chronic acid suppression reportedly helps with the dysphagia.  Renee Davidson needs oversight at meals to make sure that she chews things well, eats slowly and has plenty of fluids. Bowel habits are reportedly regular but noted by assisted living facility staff to be dark.  She reportedly had a stool test positive for occult blood, which was part of the reason for today's consult.   ROS:  Review of Systems  Constitutional:  Negative for appetite change and unexpected weight change.  HENT:  Negative for mouth sores and voice change.   Eyes:  Negative for pain and redness.  Respiratory:  Negative for cough and shortness of breath.   Cardiovascular:  Negative for chest pain and palpitations.  Genitourinary:  Negative for dysuria and hematuria.  Musculoskeletal:  Negative  for arthralgias and myalgias.  Skin:  Negative for pallor and rash.  Neurological:  Negative for weakness and headaches.  Hematological:  Negative for adenopathy.   She is gained about 100 pounds in the last 5 years according to her sister. Right upper arm swelling and pain, right shoulder pain  Past Medical History: Past  Medical History:  Diagnosis Date   Anxiety    Conversion disorder with seizures or convulsions    Depression    Diverticulosis    DVT (deep venous thrombosis) (HCC)    right   Essential hypertension    GERD (gastroesophageal reflux disease)    Hiatal hernia    Humerus fracture    right   Hyperlipidemia    IDA (iron deficiency anemia)    Mental developmental delay    OCD (obsessive compulsive disorder)    Osteoporosis    Parkinson disease ([place])    Peptic ulcer    Thyroid disease    Thyroid nodule      Past Surgical History: Past Surgical History:  Procedure Laterality Date   BIOPSY  11/02/2019   Procedure: BIOPSY;  Surgeon: Gatha Mayer, MD;  Location: [facility];  Service: Endoscopy;;   COLONOSCOPY WITH PROPOFOL N/A 11/02/2019   Procedure: COLONOSCOPY WITH PROPOFOL;  Surgeon: Gatha Mayer, MD;  Location: [facility];  Service: Endoscopy;  Laterality: N/A;   ESOPHAGOGASTRODUODENOSCOPY (EGD) WITH PROPOFOL N/A 11/01/2019   Procedure: ESOPHAGOGASTRODUODENOSCOPY (EGD) WITH PROPOFOL;  Surgeon: Gatha Mayer, MD;  Location: [facility];  Service: Endoscopy;  Laterality: N/A;   HUMERUS FRACTURE SURGERY Right 08/13/2020   TONSILLECTOMY       Family History: Family History  Problem Relation Age of Onset   GI problems Mother    Hypertension Mother    Hyperlipidemia Mother    Heart disease Mother    Diabetes Father    Hyperlipidemia Father    Hypertension Father    Atrial fibrillation Father    Heart attack Father    Osteoporosis Father    Colon polyps Father    Hyperlipidemia Sister    Dysphagia Sister    Heart disease Maternal Grandfather    Colon cancer Maternal Grandfather    Diabetes Paternal Grandmother    Heart disease Paternal Grandmother     Social History: Social History   Socioeconomic History   Marital status: Single    Spouse name: Not on file   Number of children: 0   Years of education: 12   Highest education level: 12th grade   Occupational History   Occupation: retired    Comment: Special educational needs teacher  Tobacco Use   Smoking status: Never   Smokeless tobacco: Never  Scientific laboratory technician Use: Never used  Substance and Sexual Activity   Alcohol use: No   Drug use: No   Sexual activity: Never  Other Topics Concern   Not on file  Social History Narrative   Not on file   Social Determinants of Health   Financial Resource Strain: Not on file  Food Insecurity: Not on file  Transportation Needs: Not on file  Physical Activity: Not on file  Stress: Not on file  Social Connections: Not on file    Allergies: Allergies  Allergen Reactions   Keflex [Cephalexin] Other (See Comments)    Dizzy, lightheaded, falls   Doxycycline Rash    Outpatient Meds: Current Outpatient Medications  Medication Sig Dispense Refill   acetaminophen (TYLENOL) 325 MG tablet Take 650 mg by mouth every 6 (  six) hours as needed.     albuterol (VENTOLIN HFA) 108 (90 Base) MCG/ACT inhaler Inhale 1-2 puffs into the lungs every 6 (six) hours as needed for wheezing or shortness of breath. 18 g 5   allopurinol (ZYLOPRIM) 100 MG tablet Take 100 mg by mouth daily.     ascorbic acid (VITAMIN C) 500 MG tablet Take 1 tablet (500 mg total) by mouth daily. 30 tablet 5   aspirin 325 MG tablet Take 325 mg by mouth daily.     Cholecalciferol 50 MCG (2000 UT) CAPS Take by mouth.     dextromethorphan-guaiFENesin (MUCINEX DM) 30-600 MG 12hr tablet Take 1 tablet by mouth 2 (two) times daily.     diclofenac Sodium (VOLTAREN) 1 % GEL Apply topically in the morning, at noon, and at bedtime.     escitalopram (LEXAPRO) 20 MG tablet Take 1 tablet (20 mg total) by mouth daily. 90 tablet 1   ferrous sulfate 325 (65 FE) MG tablet Take 1 tablet (325 mg total) by mouth daily with breakfast. 90 tablet 1   furosemide (LASIX) 20 MG tablet Take 20 mg by mouth daily.     levothyroxine (SYNTHROID) 50 MCG tablet Take 1 tablet (50 mcg total) by mouth daily before breakfast. 90  tablet 1   LORazepam (ATIVAN) 0.5 MG tablet Take 0.5 mg by mouth 2 (two) times daily.     LORazepam (ATIVAN) 2 MG/ML injection      lovastatin (MEVACOR) 40 MG tablet Take 1 tablet (40 mg total) by mouth at bedtime. 90 tablet 1   metoprolol tartrate (LOPRESSOR) 25 MG tablet Take 25 mg by mouth daily.     OLANZapine (ZYPREXA) 2.5 MG tablet Take 2.5 mg by mouth at bedtime.     pantoprazole (PROTONIX) 40 MG tablet TAKE 1 TABLET DAILY 90 tablet 0   polyvinyl alcohol (LIQUIFILM TEARS) 1.4 % ophthalmic solution 1 drop as needed for dry eyes.     traMADol (ULTRAM) 50 MG tablet Take 50 mg by mouth 4 (four) times daily.     XARELTO 15 MG TABS tablet Take 15 mg by mouth 2 (two) times daily.     No current facility-administered medications for this visit.      ___________________________________________________________________ Objective   Exam:  BP 122/70 (BP Location: Left Arm, Patient Position: Sitting, Cuff Size: Normal)   Pulse 64   Ht 5' 4.25" (1.632 m) Comment: height measured without shoes  Wt 254 lb (115.2 kg)   BMI 43.26 kg/m  Wt Readings from Last 3 Encounters:  05/27/21 254 lb (115.2 kg)  05/26/21 255 lb (115.7 kg)  12/24/20 237 lb 11.2 oz (107.8 kg)    General: Morbidly obese, in a wheelchair (limiting exam), cannot get up to exam table.  Pleasant and conversational.  Her sister was present for the entire visit. Eyes: sclera anicteric, no redness ENT: oral mucosa moist without lesions, no cervical or supraclavicular lymphadenopathy CV: RRR, heart sounds distant, right upper arm swelling and tenderness Resp: clear to auscultation bilaterally, normal RR and effort noted GI: soft, no tenderness, with active bowel sounds.  Remainder exam limited by seated position in wheelchair and body habitus Skin; warm and dry, no rash or jaundice noted Neuro: awake, alert and oriented x 3. Normal gross motor function and fluent speech  Labs:  CBC Latest Ref Rng & Units 07/10/2020 04/19/2020  02/22/2020  WBC 4.0 - 10.5 K/uL 7.5 5.3 6.8  Hemoglobin 12.0 - 15.0 g/dL 13.7 13.8 14.2  Hematocrit 36.0 - 46.0 %  42.2 43.0 44.6  Platelets 150 - 400 K/uL 246 198 233   Hemoglobin was 12.6 on 12/04/2019, 14.1 on 02/11/2020, and other results as above  From recent hematology consult note labs dated 05/19/2021:  Hemoglobin 6.3, hemoglobin 11.9, hematocrit 39, MCV 98, platelet 256  Iron 330, TIBC 433 (76% saturation), ferritin 63 Creatinine 0.9  Radiologic Studies:  September 2017 barium study report: FINDINGS: Noted stricture or mass within thoracic esophagus or distal esophagus. No mucosal irregularity, stricture mass. No swallowing dysfunction. Small hiatal hernia.   No esophageal dysmotility.   Moderate volume gastroesophageal reflux disease reflux into the high thoracic esophagus / thoracic inlet.   Barium tablet passed GE junction.     Electronically Signed   By: Suzy Bouchard M.D.   On: 04/07/2016 09:14 ________________________________________   01/27/2021 modified barium swallow:  FINDINGS: Trace flash penetration on a single swallow of thin barium. Otherwise normal swallowing with thin barium, puree, and puree with cracker.   Patient was able to swallow a pill without difficulty.   IMPRESSION: Single trace flash penetration of thin barium. Otherwise negative modified barium swallow.   Please refer to the Speech Pathologists report for complete details and recommendations.     Electronically Signed   By: Julian Hy M.D.   On: 01/27/2021 13:10 ________________________________________________  February 05, 2021 barium study  CLINICAL DATA:  Dysphagia, unspecified type. Additional history provided: Patient feels as though food is becoming stuck in the region of the mid chest.   EXAM: ESOPHOGRAM / BARIUM SWALLOW / BARIUM TABLET STUDY   TECHNIQUE: Combined double contrast and single contrast examination performed using effervescent crystals, thick  barium liquid, and thin barium liquid. The patient was observed with fluoroscopy swallowing a 13 mm barium sulphate tablet.   FLUOROSCOPY TIME:  Fluoroscopy Time:  3 minutes, 6 seconds.   Radiation Exposure Index (if provided by the fluoroscopic device): 30.50 mGy.   Number of Acquired Spot Images: None   COMPARISON:  Report from modified barium swallow 01/27/2021. CT of the cervical spine 05/11/2020. report from CT abdomen/pelvis 11/14/2008.   FINDINGS: The fluoroscopic images were acquired by Soyla Dryer, NP.   Fluoroscopic evaluation demonstrates a mildly patulous esophagus. No evidence of fixed stricture, mass or mucosal abnormality. Moderate intermittent esophageal dysmotility with tertiary contractions. Large hiatal hernia. Large volume gastroesophageal reflux was observed to the level of the upper esophagus. The patient swallowed a 13 mm barium tablet, which freely passed from the distal esophagus into the hiatal hernia.   IMPRESSION: Mildly patulous esophagus   Large hiatal hernia.   Moderate intermittent esophageal dysmotility with tertiary contractions.   Large-volume gastroesophageal reflux observed to the level of the upper esophagus.   Otherwise unremarkable esophagram, as described.     Electronically Signed   By: Kellie Simmering DO   On: 02/05/2021 09:58    Assessment: Encounter Diagnoses  Name Primary?   Heme positive stool Yes   Normocytic anemia    Esophageal dysphagia    Hiatal hernia    Chronic Renee Davidson ulcer     Chronic anemia, reported heme positive stool, no longer iron deficient on oral supplement.  Other factors must be contributing to anemia besides GI blood loss I suspect the known [place] erosions account for the heme positive stool and any component of chronic blood loss anemia she may still be having.  This requires iron supplements per hematology consultant, and I expect they will regularly follow her blood counts to dose  accordingly and work-up any other  causes of anemia.  No current plans for endoscopic procedures for further work-up of heme positive stool and anemia.  I believe that would be low yield given what we already know about her and increased risk given the patient's overall medical condition.  She has longstanding esophageal dysphagia with suspected dysmotility made worse by large hiatal hernia and resultant esophageal tortuosity caused by that.  She has largely accommodated to this over the years as long as she is cautious about eating, which was reinforced today.  She has episodic spasm feelings of food feels stuck, her sister says she experiences something similar and has taught Renee Davidson how to relax with breathing techniques until it passes.  Heimlich maneuver should not be entertained unless there is suspicion this patient is choking with food stuck in the upper airway.  My opinion is that Minh Jasper is a poor candidate for hiatal hernia repair surgery given her obesity, medical comorbidities and overall condition.  Since we also cannot characterize her dysmotility any better as she will not be able to tolerate an esophageal manometry, even if loose fundoplication were performed with a hernia repair, I am concerned it may not help her dysphagia is much as we hope.  I explained all this in detail to her sister and she understood and agreed.   Having no other testing currently planned nor new treatment, I will return her to primary care and hematology and see her again as needed.   Thank you for the courtesy of this consult.  Please call me with any questions or concerns.   45 minutes were spent on this encounter (including chart review, history/exam, counseling/coordination of care, and documentation) > 50% of that time was spent on counseling and coordination of care.  Nelida Meuse III  CC: Referring provider noted above  Also Dr. Georges Lynch [facility] hematology oncology)

## 2021-05-28 DIAGNOSIS — M79604 Pain in right leg: Secondary | ICD-10-CM | POA: Diagnosis not present

## 2021-05-28 DIAGNOSIS — E785 Hyperlipidemia, unspecified: Secondary | ICD-10-CM | POA: Diagnosis not present

## 2021-05-28 DIAGNOSIS — I10 Essential (primary) hypertension: Secondary | ICD-10-CM | POA: Diagnosis not present

## 2021-05-28 DIAGNOSIS — R5381 Other malaise: Secondary | ICD-10-CM | POA: Diagnosis not present

## 2021-05-28 DIAGNOSIS — M25511 Pain in right shoulder: Secondary | ICD-10-CM | POA: Diagnosis not present

## 2021-05-28 DIAGNOSIS — E039 Hypothyroidism, unspecified: Secondary | ICD-10-CM | POA: Diagnosis not present

## 2021-05-28 DIAGNOSIS — M6281 Muscle weakness (generalized): Secondary | ICD-10-CM | POA: Diagnosis not present

## 2021-05-28 DIAGNOSIS — R262 Difficulty in walking, not elsewhere classified: Secondary | ICD-10-CM | POA: Diagnosis not present

## 2021-05-28 DIAGNOSIS — R2681 Unsteadiness on feet: Secondary | ICD-10-CM | POA: Diagnosis not present

## 2021-05-28 DIAGNOSIS — G8929 Other chronic pain: Secondary | ICD-10-CM | POA: Diagnosis not present

## 2021-05-28 DIAGNOSIS — S42354D Nondisplaced comminuted fracture of shaft of humerus, right arm, subsequent encounter for fracture with routine healing: Secondary | ICD-10-CM | POA: Diagnosis not present

## 2021-05-28 DIAGNOSIS — K5900 Constipation, unspecified: Secondary | ICD-10-CM | POA: Diagnosis not present

## 2021-05-28 DIAGNOSIS — R1310 Dysphagia, unspecified: Secondary | ICD-10-CM | POA: Diagnosis not present

## 2021-05-28 DIAGNOSIS — M109 Gout, unspecified: Secondary | ICD-10-CM | POA: Diagnosis not present

## 2021-05-28 DIAGNOSIS — K921 Melena: Secondary | ICD-10-CM | POA: Diagnosis not present

## 2021-05-28 DIAGNOSIS — K219 Gastro-esophageal reflux disease without esophagitis: Secondary | ICD-10-CM | POA: Diagnosis not present

## 2021-05-29 DIAGNOSIS — I10 Essential (primary) hypertension: Secondary | ICD-10-CM | POA: Diagnosis not present

## 2021-05-30 DIAGNOSIS — S42354D Nondisplaced comminuted fracture of shaft of humerus, right arm, subsequent encounter for fracture with routine healing: Secondary | ICD-10-CM | POA: Diagnosis not present

## 2021-05-30 DIAGNOSIS — M6281 Muscle weakness (generalized): Secondary | ICD-10-CM | POA: Diagnosis not present

## 2021-05-30 DIAGNOSIS — R2681 Unsteadiness on feet: Secondary | ICD-10-CM | POA: Diagnosis not present

## 2021-05-30 DIAGNOSIS — R262 Difficulty in walking, not elsewhere classified: Secondary | ICD-10-CM | POA: Diagnosis not present

## 2021-06-01 DIAGNOSIS — M25511 Pain in right shoulder: Secondary | ICD-10-CM | POA: Diagnosis not present

## 2021-06-01 DIAGNOSIS — I10 Essential (primary) hypertension: Secondary | ICD-10-CM | POA: Diagnosis not present

## 2021-06-01 DIAGNOSIS — C73 Malignant neoplasm of thyroid gland: Secondary | ICD-10-CM | POA: Diagnosis not present

## 2021-06-01 DIAGNOSIS — M109 Gout, unspecified: Secondary | ICD-10-CM | POA: Diagnosis not present

## 2021-06-01 DIAGNOSIS — K219 Gastro-esophageal reflux disease without esophagitis: Secondary | ICD-10-CM | POA: Diagnosis not present

## 2021-06-01 DIAGNOSIS — E039 Hypothyroidism, unspecified: Secondary | ICD-10-CM | POA: Diagnosis not present

## 2021-06-01 DIAGNOSIS — E785 Hyperlipidemia, unspecified: Secondary | ICD-10-CM | POA: Diagnosis not present

## 2021-06-01 DIAGNOSIS — B372 Candidiasis of skin and nail: Secondary | ICD-10-CM | POA: Diagnosis not present

## 2021-06-01 DIAGNOSIS — K5900 Constipation, unspecified: Secondary | ICD-10-CM | POA: Diagnosis not present

## 2021-06-01 DIAGNOSIS — D508 Other iron deficiency anemias: Secondary | ICD-10-CM | POA: Diagnosis not present

## 2021-06-01 DIAGNOSIS — K921 Melena: Secondary | ICD-10-CM | POA: Diagnosis not present

## 2021-06-01 DIAGNOSIS — I82621 Acute embolism and thrombosis of deep veins of right upper extremity: Secondary | ICD-10-CM | POA: Diagnosis not present

## 2021-06-02 DIAGNOSIS — R41841 Cognitive communication deficit: Secondary | ICD-10-CM | POA: Diagnosis not present

## 2021-06-02 DIAGNOSIS — S42354D Nondisplaced comminuted fracture of shaft of humerus, right arm, subsequent encounter for fracture with routine healing: Secondary | ICD-10-CM | POA: Diagnosis not present

## 2021-06-02 DIAGNOSIS — G20 Parkinson's disease: Secondary | ICD-10-CM | POA: Diagnosis not present

## 2021-06-02 DIAGNOSIS — R2681 Unsteadiness on feet: Secondary | ICD-10-CM | POA: Diagnosis not present

## 2021-06-02 DIAGNOSIS — K219 Gastro-esophageal reflux disease without esophagitis: Secondary | ICD-10-CM | POA: Diagnosis not present

## 2021-06-02 DIAGNOSIS — R262 Difficulty in walking, not elsewhere classified: Secondary | ICD-10-CM | POA: Diagnosis not present

## 2021-06-02 DIAGNOSIS — M6281 Muscle weakness (generalized): Secondary | ICD-10-CM | POA: Diagnosis not present

## 2021-06-02 DIAGNOSIS — R1310 Dysphagia, unspecified: Secondary | ICD-10-CM | POA: Diagnosis not present

## 2021-06-03 DIAGNOSIS — R238 Other skin changes: Secondary | ICD-10-CM | POA: Diagnosis not present

## 2021-06-03 DIAGNOSIS — R208 Other disturbances of skin sensation: Secondary | ICD-10-CM | POA: Diagnosis not present

## 2021-06-03 DIAGNOSIS — B078 Other viral warts: Secondary | ICD-10-CM | POA: Diagnosis not present

## 2021-06-03 DIAGNOSIS — L538 Other specified erythematous conditions: Secondary | ICD-10-CM | POA: Diagnosis not present

## 2021-06-03 DIAGNOSIS — Z789 Other specified health status: Secondary | ICD-10-CM | POA: Diagnosis not present

## 2021-06-04 DIAGNOSIS — R2681 Unsteadiness on feet: Secondary | ICD-10-CM | POA: Diagnosis not present

## 2021-06-04 DIAGNOSIS — S42354D Nondisplaced comminuted fracture of shaft of humerus, right arm, subsequent encounter for fracture with routine healing: Secondary | ICD-10-CM | POA: Diagnosis not present

## 2021-06-04 DIAGNOSIS — R262 Difficulty in walking, not elsewhere classified: Secondary | ICD-10-CM | POA: Diagnosis not present

## 2021-06-04 DIAGNOSIS — G20 Parkinson's disease: Secondary | ICD-10-CM | POA: Diagnosis not present

## 2021-06-04 DIAGNOSIS — K219 Gastro-esophageal reflux disease without esophagitis: Secondary | ICD-10-CM | POA: Diagnosis not present

## 2021-06-04 DIAGNOSIS — R1310 Dysphagia, unspecified: Secondary | ICD-10-CM | POA: Diagnosis not present

## 2021-06-08 DIAGNOSIS — G20 Parkinson's disease: Secondary | ICD-10-CM | POA: Diagnosis not present

## 2021-06-08 DIAGNOSIS — R2681 Unsteadiness on feet: Secondary | ICD-10-CM | POA: Diagnosis not present

## 2021-06-08 DIAGNOSIS — R1310 Dysphagia, unspecified: Secondary | ICD-10-CM | POA: Diagnosis not present

## 2021-06-08 DIAGNOSIS — K219 Gastro-esophageal reflux disease without esophagitis: Secondary | ICD-10-CM | POA: Diagnosis not present

## 2021-06-08 DIAGNOSIS — S42354D Nondisplaced comminuted fracture of shaft of humerus, right arm, subsequent encounter for fracture with routine healing: Secondary | ICD-10-CM | POA: Diagnosis not present

## 2021-06-08 DIAGNOSIS — R262 Difficulty in walking, not elsewhere classified: Secondary | ICD-10-CM | POA: Diagnosis not present

## 2021-06-09 DIAGNOSIS — R1310 Dysphagia, unspecified: Secondary | ICD-10-CM | POA: Diagnosis not present

## 2021-06-09 DIAGNOSIS — R262 Difficulty in walking, not elsewhere classified: Secondary | ICD-10-CM | POA: Diagnosis not present

## 2021-06-09 DIAGNOSIS — S42354D Nondisplaced comminuted fracture of shaft of humerus, right arm, subsequent encounter for fracture with routine healing: Secondary | ICD-10-CM | POA: Diagnosis not present

## 2021-06-09 DIAGNOSIS — R2681 Unsteadiness on feet: Secondary | ICD-10-CM | POA: Diagnosis not present

## 2021-06-09 DIAGNOSIS — K219 Gastro-esophageal reflux disease without esophagitis: Secondary | ICD-10-CM | POA: Diagnosis not present

## 2021-06-09 DIAGNOSIS — G20 Parkinson's disease: Secondary | ICD-10-CM | POA: Diagnosis not present

## 2021-06-10 DIAGNOSIS — S42354D Nondisplaced comminuted fracture of shaft of humerus, right arm, subsequent encounter for fracture with routine healing: Secondary | ICD-10-CM | POA: Diagnosis not present

## 2021-06-10 DIAGNOSIS — R1310 Dysphagia, unspecified: Secondary | ICD-10-CM | POA: Diagnosis not present

## 2021-06-10 DIAGNOSIS — G20 Parkinson's disease: Secondary | ICD-10-CM | POA: Diagnosis not present

## 2021-06-10 DIAGNOSIS — R2681 Unsteadiness on feet: Secondary | ICD-10-CM | POA: Diagnosis not present

## 2021-06-10 DIAGNOSIS — K219 Gastro-esophageal reflux disease without esophagitis: Secondary | ICD-10-CM | POA: Diagnosis not present

## 2021-06-10 DIAGNOSIS — R262 Difficulty in walking, not elsewhere classified: Secondary | ICD-10-CM | POA: Diagnosis not present

## 2021-06-11 DIAGNOSIS — R262 Difficulty in walking, not elsewhere classified: Secondary | ICD-10-CM | POA: Diagnosis not present

## 2021-06-11 DIAGNOSIS — S42354D Nondisplaced comminuted fracture of shaft of humerus, right arm, subsequent encounter for fracture with routine healing: Secondary | ICD-10-CM | POA: Diagnosis not present

## 2021-06-11 DIAGNOSIS — R2681 Unsteadiness on feet: Secondary | ICD-10-CM | POA: Diagnosis not present

## 2021-06-11 DIAGNOSIS — R1310 Dysphagia, unspecified: Secondary | ICD-10-CM | POA: Diagnosis not present

## 2021-06-11 DIAGNOSIS — G20 Parkinson's disease: Secondary | ICD-10-CM | POA: Diagnosis not present

## 2021-06-11 DIAGNOSIS — K219 Gastro-esophageal reflux disease without esophagitis: Secondary | ICD-10-CM | POA: Diagnosis not present

## 2021-06-13 DIAGNOSIS — R2681 Unsteadiness on feet: Secondary | ICD-10-CM | POA: Diagnosis not present

## 2021-06-13 DIAGNOSIS — R262 Difficulty in walking, not elsewhere classified: Secondary | ICD-10-CM | POA: Diagnosis not present

## 2021-06-13 DIAGNOSIS — G20 Parkinson's disease: Secondary | ICD-10-CM | POA: Diagnosis not present

## 2021-06-13 DIAGNOSIS — R1310 Dysphagia, unspecified: Secondary | ICD-10-CM | POA: Diagnosis not present

## 2021-06-13 DIAGNOSIS — S42354D Nondisplaced comminuted fracture of shaft of humerus, right arm, subsequent encounter for fracture with routine healing: Secondary | ICD-10-CM | POA: Diagnosis not present

## 2021-06-13 DIAGNOSIS — K219 Gastro-esophageal reflux disease without esophagitis: Secondary | ICD-10-CM | POA: Diagnosis not present

## 2021-06-16 DIAGNOSIS — R1310 Dysphagia, unspecified: Secondary | ICD-10-CM | POA: Diagnosis not present

## 2021-06-16 DIAGNOSIS — S42354D Nondisplaced comminuted fracture of shaft of humerus, right arm, subsequent encounter for fracture with routine healing: Secondary | ICD-10-CM | POA: Diagnosis not present

## 2021-06-16 DIAGNOSIS — R2681 Unsteadiness on feet: Secondary | ICD-10-CM | POA: Diagnosis not present

## 2021-06-16 DIAGNOSIS — K219 Gastro-esophageal reflux disease without esophagitis: Secondary | ICD-10-CM | POA: Diagnosis not present

## 2021-06-16 DIAGNOSIS — R262 Difficulty in walking, not elsewhere classified: Secondary | ICD-10-CM | POA: Diagnosis not present

## 2021-06-16 DIAGNOSIS — G20 Parkinson's disease: Secondary | ICD-10-CM | POA: Diagnosis not present

## 2021-06-18 DIAGNOSIS — S42354D Nondisplaced comminuted fracture of shaft of humerus, right arm, subsequent encounter for fracture with routine healing: Secondary | ICD-10-CM | POA: Diagnosis not present

## 2021-06-18 DIAGNOSIS — R1310 Dysphagia, unspecified: Secondary | ICD-10-CM | POA: Diagnosis not present

## 2021-06-18 DIAGNOSIS — K219 Gastro-esophageal reflux disease without esophagitis: Secondary | ICD-10-CM | POA: Diagnosis not present

## 2021-06-18 DIAGNOSIS — R2681 Unsteadiness on feet: Secondary | ICD-10-CM | POA: Diagnosis not present

## 2021-06-18 DIAGNOSIS — G20 Parkinson's disease: Secondary | ICD-10-CM | POA: Diagnosis not present

## 2021-06-18 DIAGNOSIS — R262 Difficulty in walking, not elsewhere classified: Secondary | ICD-10-CM | POA: Diagnosis not present

## 2021-06-22 DIAGNOSIS — R062 Wheezing: Secondary | ICD-10-CM | POA: Diagnosis not present

## 2021-06-22 DIAGNOSIS — R059 Cough, unspecified: Secondary | ICD-10-CM | POA: Diagnosis not present

## 2021-06-23 DIAGNOSIS — D508 Other iron deficiency anemias: Secondary | ICD-10-CM | POA: Diagnosis not present

## 2021-06-23 DIAGNOSIS — I10 Essential (primary) hypertension: Secondary | ICD-10-CM | POA: Diagnosis not present

## 2021-06-23 DIAGNOSIS — E785 Hyperlipidemia, unspecified: Secondary | ICD-10-CM | POA: Diagnosis not present

## 2021-06-23 DIAGNOSIS — E039 Hypothyroidism, unspecified: Secondary | ICD-10-CM | POA: Diagnosis not present

## 2021-06-24 DIAGNOSIS — D649 Anemia, unspecified: Secondary | ICD-10-CM | POA: Diagnosis not present

## 2021-06-24 DIAGNOSIS — I7091 Generalized atherosclerosis: Secondary | ICD-10-CM | POA: Diagnosis not present

## 2021-06-24 DIAGNOSIS — E041 Nontoxic single thyroid nodule: Secondary | ICD-10-CM | POA: Diagnosis not present

## 2021-06-24 DIAGNOSIS — I82621 Acute embolism and thrombosis of deep veins of right upper extremity: Secondary | ICD-10-CM | POA: Diagnosis not present

## 2021-06-24 DIAGNOSIS — L603 Nail dystrophy: Secondary | ICD-10-CM | POA: Diagnosis not present

## 2021-06-25 DIAGNOSIS — I10 Essential (primary) hypertension: Secondary | ICD-10-CM | POA: Diagnosis not present

## 2021-06-29 DIAGNOSIS — K449 Diaphragmatic hernia without obstruction or gangrene: Secondary | ICD-10-CM | POA: Diagnosis not present

## 2021-06-29 DIAGNOSIS — M109 Gout, unspecified: Secondary | ICD-10-CM | POA: Diagnosis not present

## 2021-06-29 DIAGNOSIS — I10 Essential (primary) hypertension: Secondary | ICD-10-CM | POA: Diagnosis not present

## 2021-06-29 DIAGNOSIS — I82621 Acute embolism and thrombosis of deep veins of right upper extremity: Secondary | ICD-10-CM | POA: Diagnosis not present

## 2021-06-29 DIAGNOSIS — D508 Other iron deficiency anemias: Secondary | ICD-10-CM | POA: Diagnosis not present

## 2021-06-29 DIAGNOSIS — E039 Hypothyroidism, unspecified: Secondary | ICD-10-CM | POA: Diagnosis not present

## 2021-06-29 DIAGNOSIS — R1310 Dysphagia, unspecified: Secondary | ICD-10-CM | POA: Diagnosis not present

## 2021-06-29 DIAGNOSIS — J189 Pneumonia, unspecified organism: Secondary | ICD-10-CM | POA: Diagnosis not present

## 2021-06-29 DIAGNOSIS — C73 Malignant neoplasm of thyroid gland: Secondary | ICD-10-CM | POA: Diagnosis not present

## 2021-06-29 DIAGNOSIS — K219 Gastro-esophageal reflux disease without esophagitis: Secondary | ICD-10-CM | POA: Diagnosis not present

## 2021-06-29 DIAGNOSIS — E785 Hyperlipidemia, unspecified: Secondary | ICD-10-CM | POA: Diagnosis not present

## 2021-06-29 DIAGNOSIS — F429 Obsessive-compulsive disorder, unspecified: Secondary | ICD-10-CM | POA: Diagnosis not present

## 2021-06-30 DIAGNOSIS — B078 Other viral warts: Secondary | ICD-10-CM | POA: Diagnosis not present

## 2021-06-30 DIAGNOSIS — I82621 Acute embolism and thrombosis of deep veins of right upper extremity: Secondary | ICD-10-CM | POA: Diagnosis not present

## 2021-06-30 DIAGNOSIS — R262 Difficulty in walking, not elsewhere classified: Secondary | ICD-10-CM | POA: Diagnosis not present

## 2021-06-30 DIAGNOSIS — R208 Other disturbances of skin sensation: Secondary | ICD-10-CM | POA: Diagnosis not present

## 2021-06-30 DIAGNOSIS — Z789 Other specified health status: Secondary | ICD-10-CM | POA: Diagnosis not present

## 2021-06-30 DIAGNOSIS — R1310 Dysphagia, unspecified: Secondary | ICD-10-CM | POA: Diagnosis not present

## 2021-06-30 DIAGNOSIS — G20 Parkinson's disease: Secondary | ICD-10-CM | POA: Diagnosis not present

## 2021-06-30 DIAGNOSIS — I10 Essential (primary) hypertension: Secondary | ICD-10-CM | POA: Diagnosis not present

## 2021-06-30 DIAGNOSIS — R238 Other skin changes: Secondary | ICD-10-CM | POA: Diagnosis not present

## 2021-06-30 DIAGNOSIS — S42354D Nondisplaced comminuted fracture of shaft of humerus, right arm, subsequent encounter for fracture with routine healing: Secondary | ICD-10-CM | POA: Diagnosis not present

## 2021-06-30 DIAGNOSIS — R2681 Unsteadiness on feet: Secondary | ICD-10-CM | POA: Diagnosis not present

## 2021-06-30 DIAGNOSIS — K219 Gastro-esophageal reflux disease without esophagitis: Secondary | ICD-10-CM | POA: Diagnosis not present

## 2021-06-30 DIAGNOSIS — L538 Other specified erythematous conditions: Secondary | ICD-10-CM | POA: Diagnosis not present

## 2021-07-01 DIAGNOSIS — G20 Parkinson's disease: Secondary | ICD-10-CM | POA: Diagnosis not present

## 2021-07-01 DIAGNOSIS — R2681 Unsteadiness on feet: Secondary | ICD-10-CM | POA: Diagnosis not present

## 2021-07-01 DIAGNOSIS — R262 Difficulty in walking, not elsewhere classified: Secondary | ICD-10-CM | POA: Diagnosis not present

## 2021-07-01 DIAGNOSIS — S42354D Nondisplaced comminuted fracture of shaft of humerus, right arm, subsequent encounter for fracture with routine healing: Secondary | ICD-10-CM | POA: Diagnosis not present

## 2021-07-01 DIAGNOSIS — K219 Gastro-esophageal reflux disease without esophagitis: Secondary | ICD-10-CM | POA: Diagnosis not present

## 2021-07-01 DIAGNOSIS — R1310 Dysphagia, unspecified: Secondary | ICD-10-CM | POA: Diagnosis not present

## 2021-07-02 DIAGNOSIS — S42354D Nondisplaced comminuted fracture of shaft of humerus, right arm, subsequent encounter for fracture with routine healing: Secondary | ICD-10-CM | POA: Diagnosis not present

## 2021-07-02 DIAGNOSIS — R2681 Unsteadiness on feet: Secondary | ICD-10-CM | POA: Diagnosis not present

## 2021-07-02 DIAGNOSIS — M6281 Muscle weakness (generalized): Secondary | ICD-10-CM | POA: Diagnosis not present

## 2021-07-02 DIAGNOSIS — G20 Parkinson's disease: Secondary | ICD-10-CM | POA: Diagnosis not present

## 2021-07-02 DIAGNOSIS — R41841 Cognitive communication deficit: Secondary | ICD-10-CM | POA: Diagnosis not present

## 2021-07-03 DIAGNOSIS — R41841 Cognitive communication deficit: Secondary | ICD-10-CM | POA: Diagnosis not present

## 2021-07-03 DIAGNOSIS — G20 Parkinson's disease: Secondary | ICD-10-CM | POA: Diagnosis not present

## 2021-07-03 DIAGNOSIS — S42354D Nondisplaced comminuted fracture of shaft of humerus, right arm, subsequent encounter for fracture with routine healing: Secondary | ICD-10-CM | POA: Diagnosis not present

## 2021-07-03 DIAGNOSIS — M6281 Muscle weakness (generalized): Secondary | ICD-10-CM | POA: Diagnosis not present

## 2021-07-03 DIAGNOSIS — R2681 Unsteadiness on feet: Secondary | ICD-10-CM | POA: Diagnosis not present

## 2021-07-04 DIAGNOSIS — R2681 Unsteadiness on feet: Secondary | ICD-10-CM | POA: Diagnosis not present

## 2021-07-04 DIAGNOSIS — M6281 Muscle weakness (generalized): Secondary | ICD-10-CM | POA: Diagnosis not present

## 2021-07-04 DIAGNOSIS — R41841 Cognitive communication deficit: Secondary | ICD-10-CM | POA: Diagnosis not present

## 2021-07-04 DIAGNOSIS — S42354D Nondisplaced comminuted fracture of shaft of humerus, right arm, subsequent encounter for fracture with routine healing: Secondary | ICD-10-CM | POA: Diagnosis not present

## 2021-07-04 DIAGNOSIS — G20 Parkinson's disease: Secondary | ICD-10-CM | POA: Diagnosis not present

## 2021-07-06 DIAGNOSIS — Z8781 Personal history of (healed) traumatic fracture: Secondary | ICD-10-CM | POA: Diagnosis not present

## 2021-07-06 DIAGNOSIS — R2681 Unsteadiness on feet: Secondary | ICD-10-CM | POA: Diagnosis not present

## 2021-07-06 DIAGNOSIS — Z9889 Other specified postprocedural states: Secondary | ICD-10-CM | POA: Diagnosis not present

## 2021-07-06 DIAGNOSIS — G20 Parkinson's disease: Secondary | ICD-10-CM | POA: Diagnosis not present

## 2021-07-06 DIAGNOSIS — M6281 Muscle weakness (generalized): Secondary | ICD-10-CM | POA: Diagnosis not present

## 2021-07-06 DIAGNOSIS — Z4789 Encounter for other orthopedic aftercare: Secondary | ICD-10-CM | POA: Diagnosis not present

## 2021-07-06 DIAGNOSIS — S42354D Nondisplaced comminuted fracture of shaft of humerus, right arm, subsequent encounter for fracture with routine healing: Secondary | ICD-10-CM | POA: Diagnosis not present

## 2021-07-06 DIAGNOSIS — R41841 Cognitive communication deficit: Secondary | ICD-10-CM | POA: Diagnosis not present

## 2021-07-07 DIAGNOSIS — S42354D Nondisplaced comminuted fracture of shaft of humerus, right arm, subsequent encounter for fracture with routine healing: Secondary | ICD-10-CM | POA: Diagnosis not present

## 2021-07-07 DIAGNOSIS — R41841 Cognitive communication deficit: Secondary | ICD-10-CM | POA: Diagnosis not present

## 2021-07-07 DIAGNOSIS — R2681 Unsteadiness on feet: Secondary | ICD-10-CM | POA: Diagnosis not present

## 2021-07-07 DIAGNOSIS — M6281 Muscle weakness (generalized): Secondary | ICD-10-CM | POA: Diagnosis not present

## 2021-07-07 DIAGNOSIS — G20 Parkinson's disease: Secondary | ICD-10-CM | POA: Diagnosis not present

## 2021-07-08 DIAGNOSIS — R41841 Cognitive communication deficit: Secondary | ICD-10-CM | POA: Diagnosis not present

## 2021-07-08 DIAGNOSIS — S42354D Nondisplaced comminuted fracture of shaft of humerus, right arm, subsequent encounter for fracture with routine healing: Secondary | ICD-10-CM | POA: Diagnosis not present

## 2021-07-08 DIAGNOSIS — M6281 Muscle weakness (generalized): Secondary | ICD-10-CM | POA: Diagnosis not present

## 2021-07-08 DIAGNOSIS — G20 Parkinson's disease: Secondary | ICD-10-CM | POA: Diagnosis not present

## 2021-07-08 DIAGNOSIS — R2681 Unsteadiness on feet: Secondary | ICD-10-CM | POA: Diagnosis not present

## 2021-07-09 DIAGNOSIS — M6281 Muscle weakness (generalized): Secondary | ICD-10-CM | POA: Diagnosis not present

## 2021-07-09 DIAGNOSIS — R41841 Cognitive communication deficit: Secondary | ICD-10-CM | POA: Diagnosis not present

## 2021-07-09 DIAGNOSIS — R2681 Unsteadiness on feet: Secondary | ICD-10-CM | POA: Diagnosis not present

## 2021-07-09 DIAGNOSIS — G20 Parkinson's disease: Secondary | ICD-10-CM | POA: Diagnosis not present

## 2021-07-09 DIAGNOSIS — S42354D Nondisplaced comminuted fracture of shaft of humerus, right arm, subsequent encounter for fracture with routine healing: Secondary | ICD-10-CM | POA: Diagnosis not present

## 2021-07-10 DIAGNOSIS — G20 Parkinson's disease: Secondary | ICD-10-CM | POA: Diagnosis not present

## 2021-07-10 DIAGNOSIS — R2681 Unsteadiness on feet: Secondary | ICD-10-CM | POA: Diagnosis not present

## 2021-07-10 DIAGNOSIS — M6281 Muscle weakness (generalized): Secondary | ICD-10-CM | POA: Diagnosis not present

## 2021-07-10 DIAGNOSIS — S42354D Nondisplaced comminuted fracture of shaft of humerus, right arm, subsequent encounter for fracture with routine healing: Secondary | ICD-10-CM | POA: Diagnosis not present

## 2021-07-10 DIAGNOSIS — R41841 Cognitive communication deficit: Secondary | ICD-10-CM | POA: Diagnosis not present

## 2021-07-13 DIAGNOSIS — S42354D Nondisplaced comminuted fracture of shaft of humerus, right arm, subsequent encounter for fracture with routine healing: Secondary | ICD-10-CM | POA: Diagnosis not present

## 2021-07-13 DIAGNOSIS — R41841 Cognitive communication deficit: Secondary | ICD-10-CM | POA: Diagnosis not present

## 2021-07-13 DIAGNOSIS — G20 Parkinson's disease: Secondary | ICD-10-CM | POA: Diagnosis not present

## 2021-07-13 DIAGNOSIS — M6281 Muscle weakness (generalized): Secondary | ICD-10-CM | POA: Diagnosis not present

## 2021-07-13 DIAGNOSIS — R2681 Unsteadiness on feet: Secondary | ICD-10-CM | POA: Diagnosis not present

## 2021-07-14 DIAGNOSIS — G20 Parkinson's disease: Secondary | ICD-10-CM | POA: Diagnosis not present

## 2021-07-14 DIAGNOSIS — M6281 Muscle weakness (generalized): Secondary | ICD-10-CM | POA: Diagnosis not present

## 2021-07-14 DIAGNOSIS — S42354D Nondisplaced comminuted fracture of shaft of humerus, right arm, subsequent encounter for fracture with routine healing: Secondary | ICD-10-CM | POA: Diagnosis not present

## 2021-07-14 DIAGNOSIS — R41841 Cognitive communication deficit: Secondary | ICD-10-CM | POA: Diagnosis not present

## 2021-07-14 DIAGNOSIS — R2681 Unsteadiness on feet: Secondary | ICD-10-CM | POA: Diagnosis not present

## 2021-07-15 DIAGNOSIS — R1310 Dysphagia, unspecified: Secondary | ICD-10-CM | POA: Diagnosis not present

## 2021-07-15 DIAGNOSIS — M109 Gout, unspecified: Secondary | ICD-10-CM | POA: Diagnosis not present

## 2021-07-15 DIAGNOSIS — C73 Malignant neoplasm of thyroid gland: Secondary | ICD-10-CM | POA: Diagnosis not present

## 2021-07-15 DIAGNOSIS — I10 Essential (primary) hypertension: Secondary | ICD-10-CM | POA: Diagnosis not present

## 2021-07-15 DIAGNOSIS — E039 Hypothyroidism, unspecified: Secondary | ICD-10-CM | POA: Diagnosis not present

## 2021-07-15 DIAGNOSIS — M6281 Muscle weakness (generalized): Secondary | ICD-10-CM | POA: Diagnosis not present

## 2021-07-15 DIAGNOSIS — R2681 Unsteadiness on feet: Secondary | ICD-10-CM | POA: Diagnosis not present

## 2021-07-15 DIAGNOSIS — K449 Diaphragmatic hernia without obstruction or gangrene: Secondary | ICD-10-CM | POA: Diagnosis not present

## 2021-07-15 DIAGNOSIS — R41841 Cognitive communication deficit: Secondary | ICD-10-CM | POA: Diagnosis not present

## 2021-07-15 DIAGNOSIS — K921 Melena: Secondary | ICD-10-CM | POA: Diagnosis not present

## 2021-07-15 DIAGNOSIS — S42354D Nondisplaced comminuted fracture of shaft of humerus, right arm, subsequent encounter for fracture with routine healing: Secondary | ICD-10-CM | POA: Diagnosis not present

## 2021-07-15 DIAGNOSIS — S42301D Unspecified fracture of shaft of humerus, right arm, subsequent encounter for fracture with routine healing: Secondary | ICD-10-CM | POA: Diagnosis not present

## 2021-07-15 DIAGNOSIS — B372 Candidiasis of skin and nail: Secondary | ICD-10-CM | POA: Diagnosis not present

## 2021-07-15 DIAGNOSIS — F429 Obsessive-compulsive disorder, unspecified: Secondary | ICD-10-CM | POA: Diagnosis not present

## 2021-07-15 DIAGNOSIS — G20 Parkinson's disease: Secondary | ICD-10-CM | POA: Diagnosis not present

## 2021-07-15 DIAGNOSIS — K5900 Constipation, unspecified: Secondary | ICD-10-CM | POA: Diagnosis not present

## 2021-07-15 DIAGNOSIS — I82621 Acute embolism and thrombosis of deep veins of right upper extremity: Secondary | ICD-10-CM | POA: Diagnosis not present

## 2021-07-16 DIAGNOSIS — M6281 Muscle weakness (generalized): Secondary | ICD-10-CM | POA: Diagnosis not present

## 2021-07-16 DIAGNOSIS — S42354D Nondisplaced comminuted fracture of shaft of humerus, right arm, subsequent encounter for fracture with routine healing: Secondary | ICD-10-CM | POA: Diagnosis not present

## 2021-07-16 DIAGNOSIS — R2681 Unsteadiness on feet: Secondary | ICD-10-CM | POA: Diagnosis not present

## 2021-07-16 DIAGNOSIS — R41841 Cognitive communication deficit: Secondary | ICD-10-CM | POA: Diagnosis not present

## 2021-07-16 DIAGNOSIS — I471 Supraventricular tachycardia: Secondary | ICD-10-CM | POA: Diagnosis not present

## 2021-07-16 DIAGNOSIS — Z01818 Encounter for other preprocedural examination: Secondary | ICD-10-CM | POA: Diagnosis not present

## 2021-07-16 DIAGNOSIS — G20 Parkinson's disease: Secondary | ICD-10-CM | POA: Diagnosis not present

## 2021-07-16 DIAGNOSIS — R0609 Other forms of dyspnea: Secondary | ICD-10-CM | POA: Diagnosis not present

## 2021-07-16 DIAGNOSIS — I509 Heart failure, unspecified: Secondary | ICD-10-CM | POA: Diagnosis not present

## 2021-07-17 DIAGNOSIS — S42354D Nondisplaced comminuted fracture of shaft of humerus, right arm, subsequent encounter for fracture with routine healing: Secondary | ICD-10-CM | POA: Diagnosis not present

## 2021-07-17 DIAGNOSIS — R601 Generalized edema: Secondary | ICD-10-CM | POA: Diagnosis not present

## 2021-07-17 DIAGNOSIS — R2681 Unsteadiness on feet: Secondary | ICD-10-CM | POA: Diagnosis not present

## 2021-07-17 DIAGNOSIS — G20 Parkinson's disease: Secondary | ICD-10-CM | POA: Diagnosis not present

## 2021-07-17 DIAGNOSIS — M6281 Muscle weakness (generalized): Secondary | ICD-10-CM | POA: Diagnosis not present

## 2021-07-17 DIAGNOSIS — R41841 Cognitive communication deficit: Secondary | ICD-10-CM | POA: Diagnosis not present

## 2021-07-20 DIAGNOSIS — E785 Hyperlipidemia, unspecified: Secondary | ICD-10-CM | POA: Diagnosis not present

## 2021-07-20 DIAGNOSIS — M6281 Muscle weakness (generalized): Secondary | ICD-10-CM | POA: Diagnosis not present

## 2021-07-20 DIAGNOSIS — I10 Essential (primary) hypertension: Secondary | ICD-10-CM | POA: Diagnosis not present

## 2021-07-20 DIAGNOSIS — D508 Other iron deficiency anemias: Secondary | ICD-10-CM | POA: Diagnosis not present

## 2021-07-20 DIAGNOSIS — G20 Parkinson's disease: Secondary | ICD-10-CM | POA: Diagnosis not present

## 2021-07-20 DIAGNOSIS — R2681 Unsteadiness on feet: Secondary | ICD-10-CM | POA: Diagnosis not present

## 2021-07-20 DIAGNOSIS — E039 Hypothyroidism, unspecified: Secondary | ICD-10-CM | POA: Diagnosis not present

## 2021-07-20 DIAGNOSIS — R41841 Cognitive communication deficit: Secondary | ICD-10-CM | POA: Diagnosis not present

## 2021-07-20 DIAGNOSIS — S42354D Nondisplaced comminuted fracture of shaft of humerus, right arm, subsequent encounter for fracture with routine healing: Secondary | ICD-10-CM | POA: Diagnosis not present

## 2021-07-21 DIAGNOSIS — R2681 Unsteadiness on feet: Secondary | ICD-10-CM | POA: Diagnosis not present

## 2021-07-21 DIAGNOSIS — G20 Parkinson's disease: Secondary | ICD-10-CM | POA: Diagnosis not present

## 2021-07-21 DIAGNOSIS — M6281 Muscle weakness (generalized): Secondary | ICD-10-CM | POA: Diagnosis not present

## 2021-07-21 DIAGNOSIS — R41841 Cognitive communication deficit: Secondary | ICD-10-CM | POA: Diagnosis not present

## 2021-07-21 DIAGNOSIS — S42354D Nondisplaced comminuted fracture of shaft of humerus, right arm, subsequent encounter for fracture with routine healing: Secondary | ICD-10-CM | POA: Diagnosis not present

## 2021-07-22 DIAGNOSIS — R41841 Cognitive communication deficit: Secondary | ICD-10-CM | POA: Diagnosis not present

## 2021-07-22 DIAGNOSIS — R2681 Unsteadiness on feet: Secondary | ICD-10-CM | POA: Diagnosis not present

## 2021-07-22 DIAGNOSIS — M6281 Muscle weakness (generalized): Secondary | ICD-10-CM | POA: Diagnosis not present

## 2021-07-22 DIAGNOSIS — S42354D Nondisplaced comminuted fracture of shaft of humerus, right arm, subsequent encounter for fracture with routine healing: Secondary | ICD-10-CM | POA: Diagnosis not present

## 2021-07-22 DIAGNOSIS — G20 Parkinson's disease: Secondary | ICD-10-CM | POA: Diagnosis not present

## 2021-07-23 DIAGNOSIS — G20 Parkinson's disease: Secondary | ICD-10-CM | POA: Diagnosis not present

## 2021-07-23 DIAGNOSIS — S42354D Nondisplaced comminuted fracture of shaft of humerus, right arm, subsequent encounter for fracture with routine healing: Secondary | ICD-10-CM | POA: Diagnosis not present

## 2021-07-23 DIAGNOSIS — M6281 Muscle weakness (generalized): Secondary | ICD-10-CM | POA: Diagnosis not present

## 2021-07-23 DIAGNOSIS — R2681 Unsteadiness on feet: Secondary | ICD-10-CM | POA: Diagnosis not present

## 2021-07-23 DIAGNOSIS — R41841 Cognitive communication deficit: Secondary | ICD-10-CM | POA: Diagnosis not present

## 2021-07-24 DIAGNOSIS — I10 Essential (primary) hypertension: Secondary | ICD-10-CM | POA: Diagnosis not present

## 2021-07-24 DIAGNOSIS — R41841 Cognitive communication deficit: Secondary | ICD-10-CM | POA: Diagnosis not present

## 2021-07-24 DIAGNOSIS — G20 Parkinson's disease: Secondary | ICD-10-CM | POA: Diagnosis not present

## 2021-07-24 DIAGNOSIS — R2681 Unsteadiness on feet: Secondary | ICD-10-CM | POA: Diagnosis not present

## 2021-07-24 DIAGNOSIS — M6281 Muscle weakness (generalized): Secondary | ICD-10-CM | POA: Diagnosis not present

## 2021-07-24 DIAGNOSIS — S42354D Nondisplaced comminuted fracture of shaft of humerus, right arm, subsequent encounter for fracture with routine healing: Secondary | ICD-10-CM | POA: Diagnosis not present

## 2021-07-27 DIAGNOSIS — R41841 Cognitive communication deficit: Secondary | ICD-10-CM | POA: Diagnosis not present

## 2021-07-27 DIAGNOSIS — M6281 Muscle weakness (generalized): Secondary | ICD-10-CM | POA: Diagnosis not present

## 2021-07-27 DIAGNOSIS — S42354D Nondisplaced comminuted fracture of shaft of humerus, right arm, subsequent encounter for fracture with routine healing: Secondary | ICD-10-CM | POA: Diagnosis not present

## 2021-07-27 DIAGNOSIS — G20 Parkinson's disease: Secondary | ICD-10-CM | POA: Diagnosis not present

## 2021-07-27 DIAGNOSIS — R2681 Unsteadiness on feet: Secondary | ICD-10-CM | POA: Diagnosis not present

## 2021-07-28 DIAGNOSIS — Z79899 Other long term (current) drug therapy: Secondary | ICD-10-CM | POA: Diagnosis not present

## 2021-07-28 DIAGNOSIS — I509 Heart failure, unspecified: Secondary | ICD-10-CM | POA: Diagnosis present

## 2021-07-28 DIAGNOSIS — R2689 Other abnormalities of gait and mobility: Secondary | ICD-10-CM | POA: Diagnosis not present

## 2021-07-28 DIAGNOSIS — S42351K Displaced comminuted fracture of shaft of humerus, right arm, subsequent encounter for fracture with nonunion: Secondary | ICD-10-CM | POA: Diagnosis not present

## 2021-07-28 DIAGNOSIS — G20 Parkinson's disease: Secondary | ICD-10-CM | POA: Diagnosis not present

## 2021-07-28 DIAGNOSIS — I10 Essential (primary) hypertension: Secondary | ICD-10-CM | POA: Diagnosis not present

## 2021-07-28 DIAGNOSIS — Z472 Encounter for removal of internal fixation device: Secondary | ICD-10-CM | POA: Diagnosis not present

## 2021-07-28 DIAGNOSIS — Z9889 Other specified postprocedural states: Secondary | ICD-10-CM | POA: Diagnosis not present

## 2021-07-28 DIAGNOSIS — G8918 Other acute postprocedural pain: Secondary | ICD-10-CM | POA: Diagnosis not present

## 2021-07-28 DIAGNOSIS — C73 Malignant neoplasm of thyroid gland: Secondary | ICD-10-CM | POA: Diagnosis present

## 2021-07-28 DIAGNOSIS — Z86718 Personal history of other venous thrombosis and embolism: Secondary | ICD-10-CM | POA: Diagnosis not present

## 2021-07-28 DIAGNOSIS — Z7901 Long term (current) use of anticoagulants: Secondary | ICD-10-CM | POA: Diagnosis not present

## 2021-07-28 DIAGNOSIS — E039 Hypothyroidism, unspecified: Secondary | ICD-10-CM | POA: Diagnosis present

## 2021-07-28 DIAGNOSIS — R262 Difficulty in walking, not elsewhere classified: Secondary | ICD-10-CM | POA: Diagnosis not present

## 2021-07-28 DIAGNOSIS — D509 Iron deficiency anemia, unspecified: Secondary | ICD-10-CM | POA: Diagnosis present

## 2021-07-28 DIAGNOSIS — Z7982 Long term (current) use of aspirin: Secondary | ICD-10-CM | POA: Diagnosis not present

## 2021-07-28 DIAGNOSIS — F445 Conversion disorder with seizures or convulsions: Secondary | ICD-10-CM | POA: Diagnosis not present

## 2021-07-28 DIAGNOSIS — E041 Nontoxic single thyroid nodule: Secondary | ICD-10-CM | POA: Diagnosis not present

## 2021-07-28 DIAGNOSIS — S42301K Unspecified fracture of shaft of humerus, right arm, subsequent encounter for fracture with nonunion: Secondary | ICD-10-CM | POA: Diagnosis not present

## 2021-07-28 DIAGNOSIS — R41841 Cognitive communication deficit: Secondary | ICD-10-CM | POA: Diagnosis not present

## 2021-07-28 DIAGNOSIS — F321 Major depressive disorder, single episode, moderate: Secondary | ICD-10-CM | POA: Diagnosis not present

## 2021-07-28 DIAGNOSIS — E785 Hyperlipidemia, unspecified: Secondary | ICD-10-CM | POA: Diagnosis present

## 2021-07-28 DIAGNOSIS — S82831A Other fracture of upper and lower end of right fibula, initial encounter for closed fracture: Secondary | ICD-10-CM | POA: Diagnosis not present

## 2021-07-28 DIAGNOSIS — G4733 Obstructive sleep apnea (adult) (pediatric): Secondary | ICD-10-CM | POA: Diagnosis present

## 2021-07-28 DIAGNOSIS — Z9181 History of falling: Secondary | ICD-10-CM | POA: Diagnosis not present

## 2021-07-28 DIAGNOSIS — Z466 Encounter for fitting and adjustment of urinary device: Secondary | ICD-10-CM | POA: Diagnosis not present

## 2021-07-28 DIAGNOSIS — Z7409 Other reduced mobility: Secondary | ICD-10-CM | POA: Diagnosis not present

## 2021-07-28 DIAGNOSIS — Z881 Allergy status to other antibiotic agents status: Secondary | ICD-10-CM | POA: Diagnosis not present

## 2021-07-28 DIAGNOSIS — F429 Obsessive-compulsive disorder, unspecified: Secondary | ICD-10-CM | POA: Diagnosis present

## 2021-07-28 DIAGNOSIS — F419 Anxiety disorder, unspecified: Secondary | ICD-10-CM | POA: Diagnosis not present

## 2021-07-28 DIAGNOSIS — S42354D Nondisplaced comminuted fracture of shaft of humerus, right arm, subsequent encounter for fracture with routine healing: Secondary | ICD-10-CM | POA: Diagnosis not present

## 2021-07-28 DIAGNOSIS — M6281 Muscle weakness (generalized): Secondary | ICD-10-CM | POA: Diagnosis not present

## 2021-07-28 DIAGNOSIS — K449 Diaphragmatic hernia without obstruction or gangrene: Secondary | ICD-10-CM | POA: Diagnosis present

## 2021-07-28 DIAGNOSIS — G4730 Sleep apnea, unspecified: Secondary | ICD-10-CM | POA: Diagnosis not present

## 2021-07-28 DIAGNOSIS — R2681 Unsteadiness on feet: Secondary | ICD-10-CM | POA: Diagnosis not present

## 2021-07-28 DIAGNOSIS — K219 Gastro-esophageal reflux disease without esophagitis: Secondary | ICD-10-CM | POA: Diagnosis present

## 2021-07-28 DIAGNOSIS — G8929 Other chronic pain: Secondary | ICD-10-CM | POA: Diagnosis not present

## 2021-07-28 DIAGNOSIS — I110 Hypertensive heart disease with heart failure: Secondary | ICD-10-CM | POA: Diagnosis present

## 2021-07-28 DIAGNOSIS — S42351A Displaced comminuted fracture of shaft of humerus, right arm, initial encounter for closed fracture: Secondary | ICD-10-CM | POA: Diagnosis present

## 2021-07-31 DIAGNOSIS — G20 Parkinson's disease: Secondary | ICD-10-CM | POA: Diagnosis not present

## 2021-07-31 DIAGNOSIS — R2681 Unsteadiness on feet: Secondary | ICD-10-CM | POA: Diagnosis not present

## 2021-07-31 DIAGNOSIS — S82831A Other fracture of upper and lower end of right fibula, initial encounter for closed fracture: Secondary | ICD-10-CM | POA: Diagnosis not present

## 2021-07-31 DIAGNOSIS — Z7409 Other reduced mobility: Secondary | ICD-10-CM | POA: Diagnosis not present

## 2021-07-31 DIAGNOSIS — S42351K Displaced comminuted fracture of shaft of humerus, right arm, subsequent encounter for fracture with nonunion: Secondary | ICD-10-CM | POA: Diagnosis not present

## 2021-07-31 DIAGNOSIS — K219 Gastro-esophageal reflux disease without esophagitis: Secondary | ICD-10-CM | POA: Diagnosis not present

## 2021-07-31 DIAGNOSIS — G8929 Other chronic pain: Secondary | ICD-10-CM | POA: Diagnosis not present

## 2021-07-31 DIAGNOSIS — F429 Obsessive-compulsive disorder, unspecified: Secondary | ICD-10-CM | POA: Diagnosis not present

## 2021-07-31 DIAGNOSIS — F321 Major depressive disorder, single episode, moderate: Secondary | ICD-10-CM | POA: Diagnosis not present

## 2021-07-31 DIAGNOSIS — R41841 Cognitive communication deficit: Secondary | ICD-10-CM | POA: Diagnosis not present

## 2021-07-31 DIAGNOSIS — D509 Iron deficiency anemia, unspecified: Secondary | ICD-10-CM | POA: Diagnosis not present

## 2021-07-31 DIAGNOSIS — F419 Anxiety disorder, unspecified: Secondary | ICD-10-CM | POA: Diagnosis not present

## 2021-07-31 DIAGNOSIS — E041 Nontoxic single thyroid nodule: Secondary | ICD-10-CM | POA: Diagnosis not present

## 2021-07-31 DIAGNOSIS — Z466 Encounter for fitting and adjustment of urinary device: Secondary | ICD-10-CM | POA: Diagnosis not present

## 2021-07-31 DIAGNOSIS — E039 Hypothyroidism, unspecified: Secondary | ICD-10-CM | POA: Diagnosis not present

## 2021-07-31 DIAGNOSIS — M6281 Muscle weakness (generalized): Secondary | ICD-10-CM | POA: Diagnosis not present

## 2021-07-31 DIAGNOSIS — E785 Hyperlipidemia, unspecified: Secondary | ICD-10-CM | POA: Diagnosis not present

## 2021-07-31 DIAGNOSIS — Z7982 Long term (current) use of aspirin: Secondary | ICD-10-CM | POA: Diagnosis not present

## 2021-07-31 DIAGNOSIS — R2689 Other abnormalities of gait and mobility: Secondary | ICD-10-CM | POA: Diagnosis not present

## 2021-07-31 DIAGNOSIS — Z472 Encounter for removal of internal fixation device: Secondary | ICD-10-CM | POA: Diagnosis not present

## 2021-07-31 DIAGNOSIS — C73 Malignant neoplasm of thyroid gland: Secondary | ICD-10-CM | POA: Diagnosis not present

## 2021-07-31 DIAGNOSIS — F445 Conversion disorder with seizures or convulsions: Secondary | ICD-10-CM | POA: Diagnosis not present

## 2021-07-31 DIAGNOSIS — I10 Essential (primary) hypertension: Secondary | ICD-10-CM | POA: Diagnosis not present

## 2021-07-31 DIAGNOSIS — Z9181 History of falling: Secondary | ICD-10-CM | POA: Diagnosis not present

## 2021-07-31 DIAGNOSIS — R262 Difficulty in walking, not elsewhere classified: Secondary | ICD-10-CM | POA: Diagnosis not present

## 2021-08-08 IMAGING — DX DG CHEST 2V
2 series · 2 of 2 positions shown · non-contrast
Comparison: Chest x-ray 04/19/2020.

CLINICAL DATA: 69-year-old female with history of shortness of
breath.

EXAM:
CHEST - 2 VIEW

[chest pa]
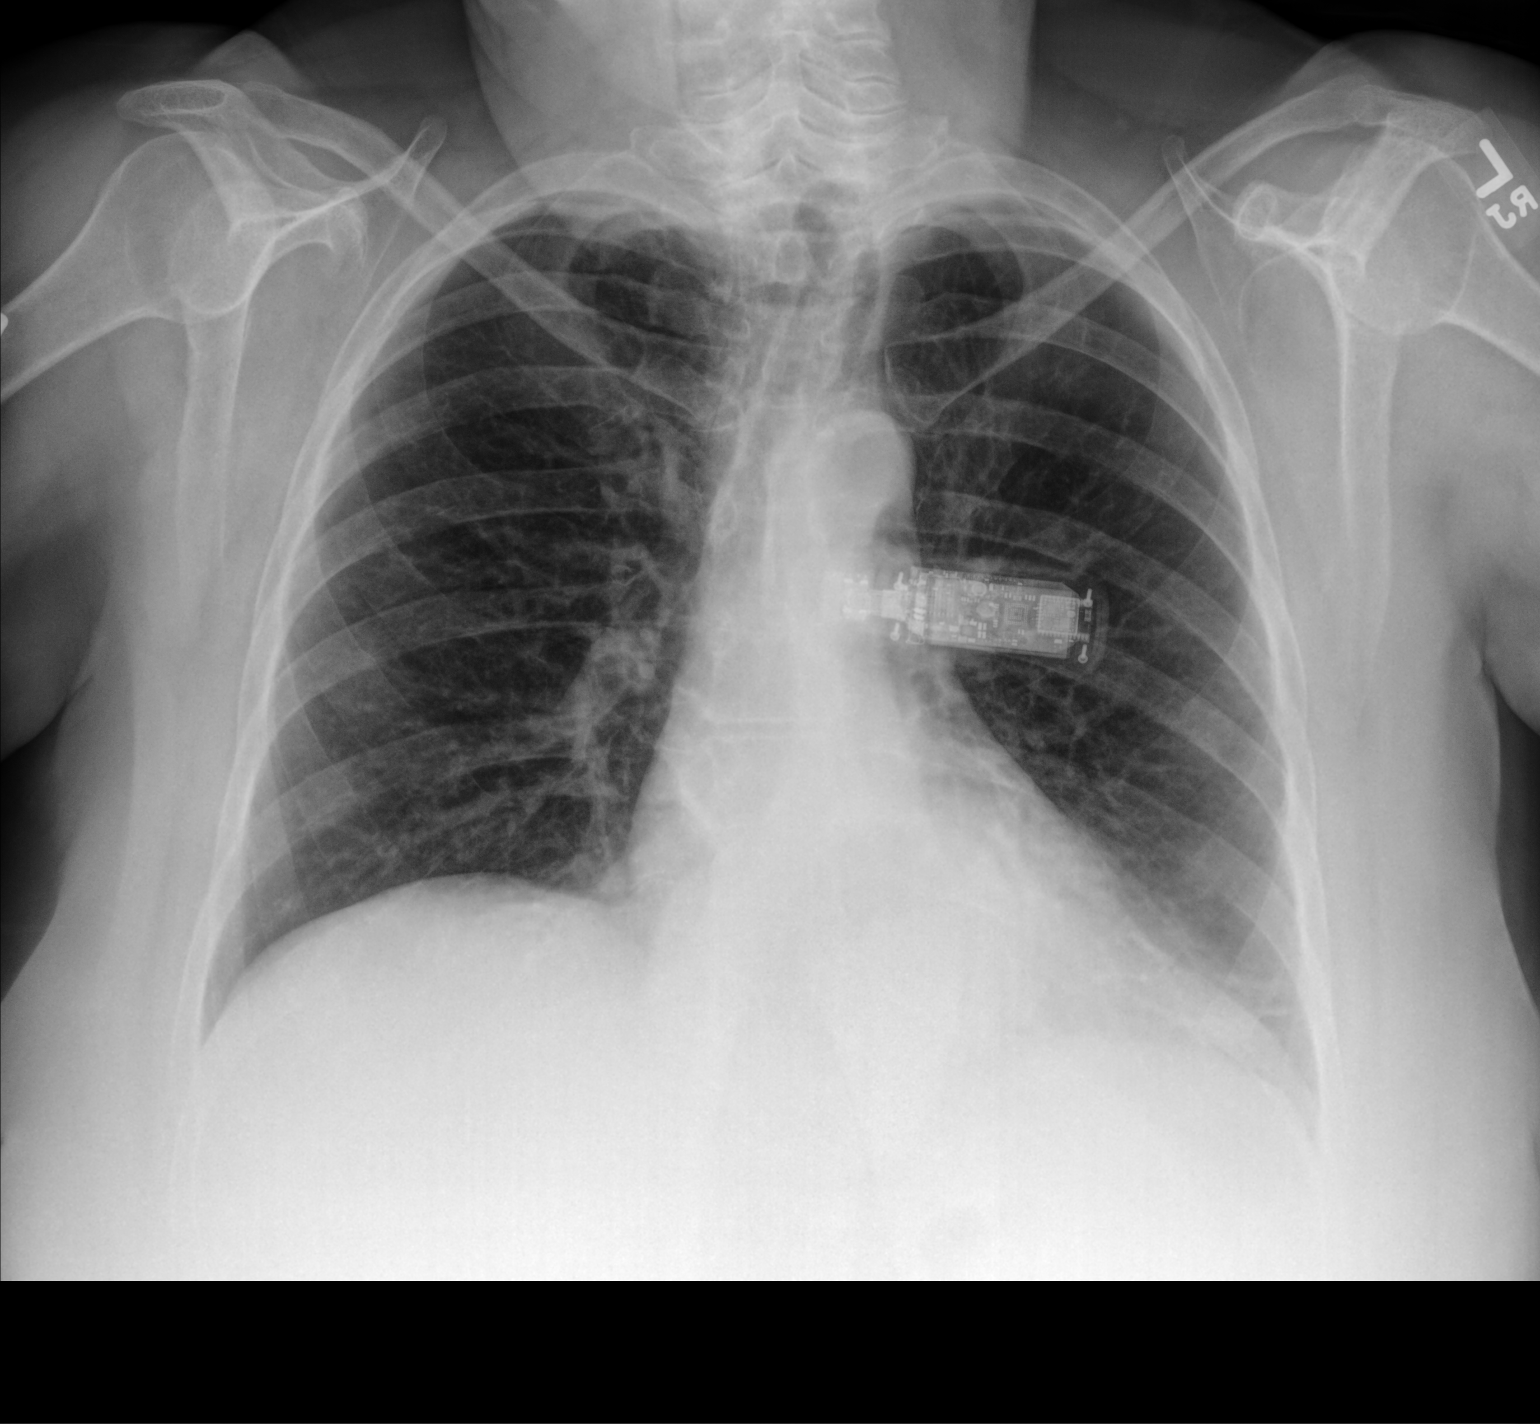

[chest lat]
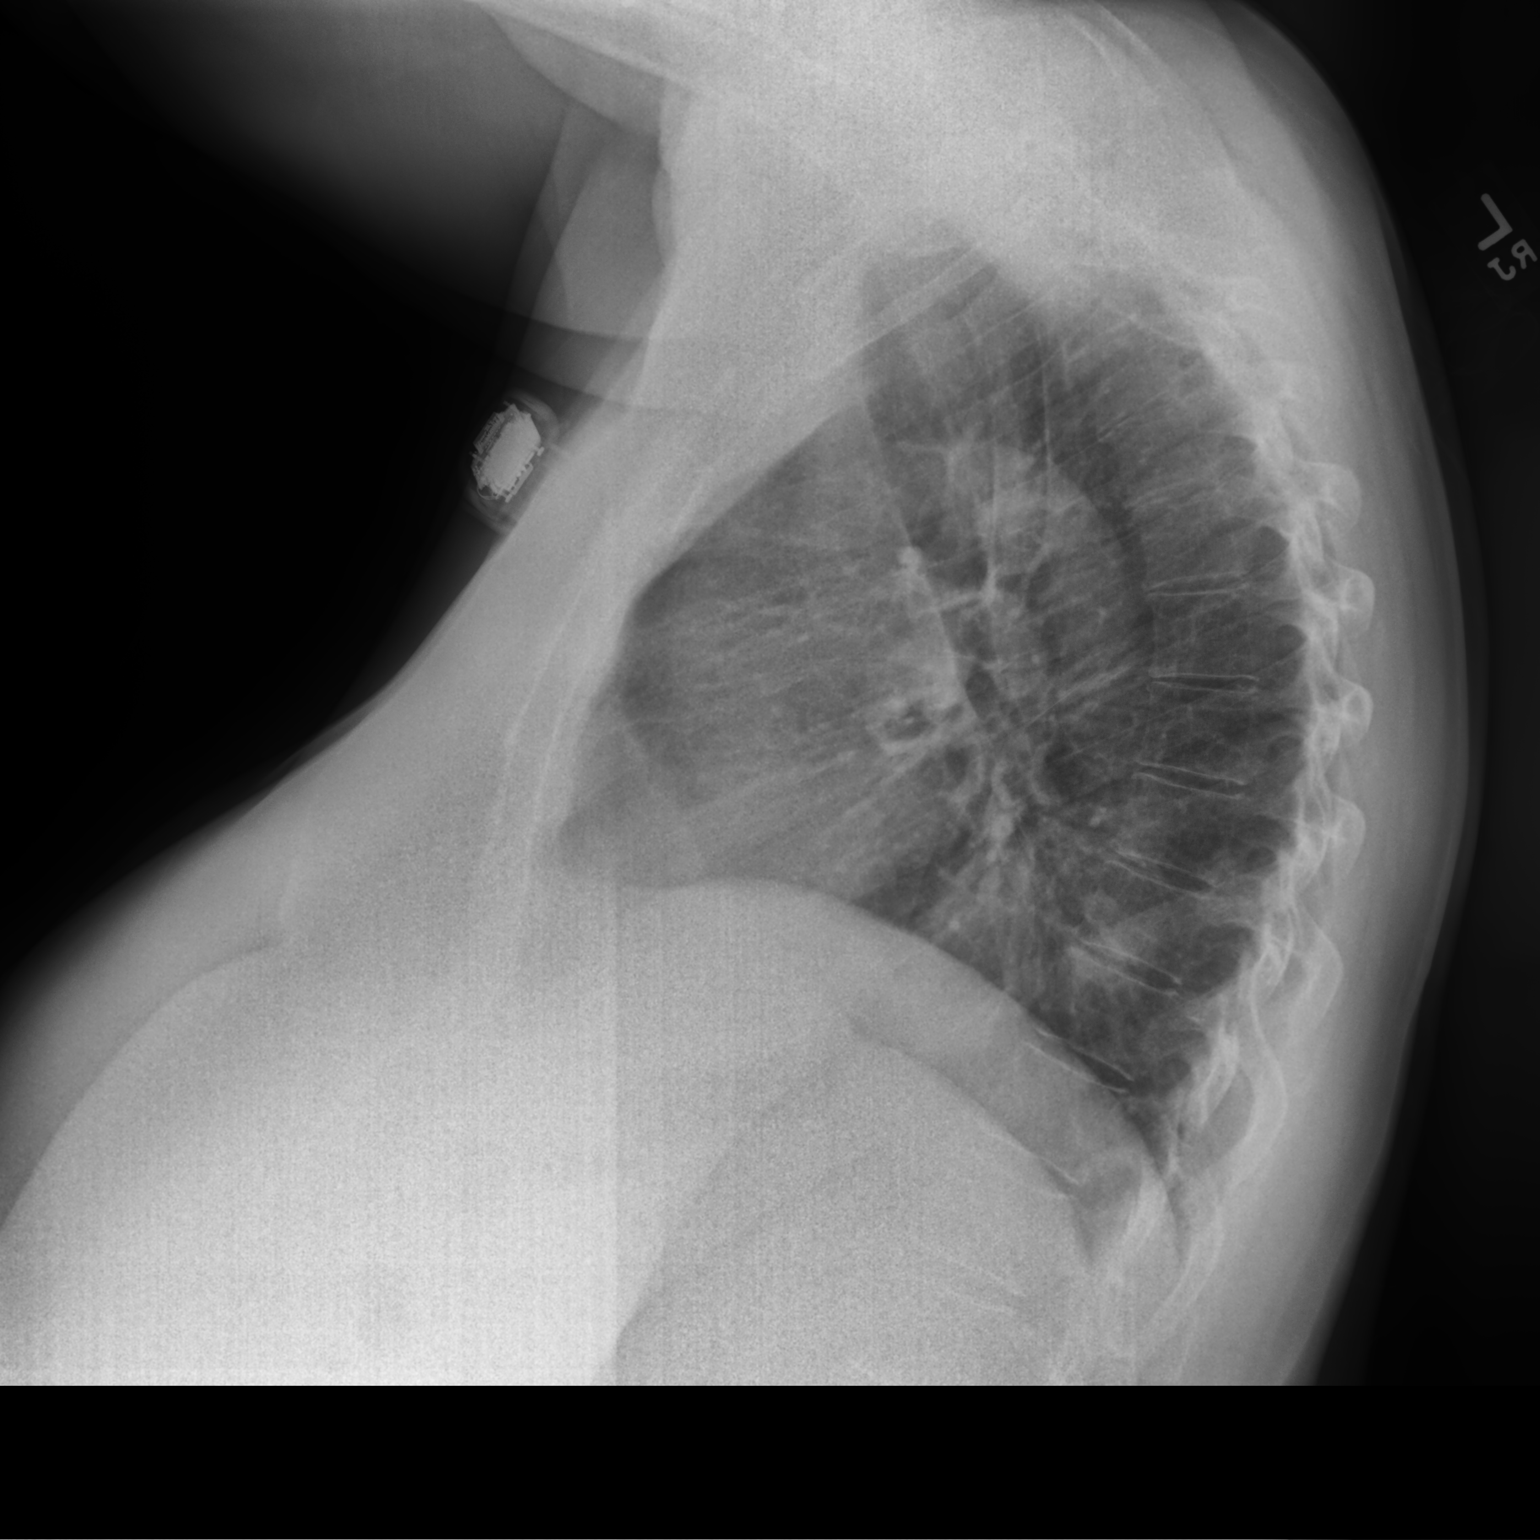

[2 of 2 positions shown; findings below may reference images not displayed]

FINDINGS: Lung volumes are low. No consolidative airspace disease. No pleural
effusions. No pneumothorax. No pulmonary nodule or mass noted.
Pulmonary vasculature and the cardiomediastinal silhouette are
within normal limits. Electronic device projecting over the left
hemithorax may represent a loop recorder.
IMPRESSION: Low lung volumes without radiographic evidence of acute
cardiopulmonary disease.

## 2021-09-02 ENCOUNTER — Telehealth: Payer: Self-pay | Admitting: "Endocrinology

## 2021-09-02 NOTE — Telephone Encounter (Signed)
Does she need to do blood work? Or just schedule a f/u visit?

## 2021-09-02 NOTE — Telephone Encounter (Signed)
Pt has not been seen since April 2022. She is wanting to sch a follow up--her sister states she was supposed to have thyroid surgery and never did because she fell. She is wanting to know if she needs to follow up with the surgeon and have the surgery before coming back here.   # [phone]

## 2021-09-03 ENCOUNTER — Other Ambulatory Visit: Payer: Self-pay

## 2021-09-03 DIAGNOSIS — Z1211 Encounter for screening for malignant neoplasm of colon: Secondary | ICD-10-CM

## 2021-09-09 ENCOUNTER — Ambulatory Visit (INDEPENDENT_AMBULATORY_CARE_PROVIDER_SITE_OTHER): Payer: Medicare Other | Admitting: "Endocrinology

## 2021-09-09 ENCOUNTER — Encounter: Payer: Self-pay | Admitting: "Endocrinology

## 2021-09-09 VITALS — BP 112/62 | HR 64 | Ht 65.5 in | Wt 254.4 lb

## 2021-09-09 DIAGNOSIS — C73 Malignant neoplasm of thyroid gland: Secondary | ICD-10-CM | POA: Diagnosis not present

## 2021-09-09 DIAGNOSIS — E039 Hypothyroidism, unspecified: Secondary | ICD-10-CM

## 2021-09-09 NOTE — Progress Notes (Signed)
09/09/2021, 7:11 PM  Endocrinology follow-up note   Subjective:    Patient ID: Renee Davidson, female    DOB: [birth year], PCP Patient, No Pcp Per (Inactive)   Past Medical History:  Diagnosis Date   Anxiety    Conversion disorder with seizures or convulsions    Depression    Diverticulosis    DVT (deep venous thrombosis) ([place])    right   Essential hypertension    GERD (gastroesophageal reflux disease)    Hiatal hernia    Humerus fracture    right   Hyperlipidemia    IDA (iron deficiency anemia)    Mental developmental delay    OCD (obsessive compulsive disorder)    Osteoporosis    Parkinson disease ([place])    Peptic ulcer    Thyroid disease    Thyroid nodule    Past Surgical History:  Procedure Laterality Date   BIOPSY  11/02/2019   Procedure: BIOPSY;  Surgeon: Gatha Mayer, MD;  Location: [facility];  Service: Endoscopy;;   COLONOSCOPY WITH PROPOFOL N/A 11/02/2019   Procedure: COLONOSCOPY WITH PROPOFOL;  Surgeon: Gatha Mayer, MD;  Location: [facility];  Service: Endoscopy;  Laterality: N/A;   ESOPHAGOGASTRODUODENOSCOPY (EGD) WITH PROPOFOL N/A 11/01/2019   Procedure: ESOPHAGOGASTRODUODENOSCOPY (EGD) WITH PROPOFOL;  Surgeon: Gatha Mayer, MD;  Location: [facility];  Service: Endoscopy;  Laterality: N/A;   HUMERUS FRACTURE SURGERY Right 08/13/2020   TONSILLECTOMY     Social History   Socioeconomic History   Marital status: Single    Spouse name: Not on file   Number of children: 0   Years of education: 12   Highest education level: 12th grade  Occupational History   Occupation: retired    Comment: Special educational needs teacher  Tobacco Use   Smoking status: Never   Smokeless tobacco: Never  Vaping Use   Vaping Use: Never used  Substance and Sexual Activity   Alcohol use: No   Drug use: No   Sexual activity: Never  Other Topics Concern   Not on file  Social History Narrative   Not on file   Social  Determinants of Health   Financial Resource Strain: Not on file  Food Insecurity: Not on file  Transportation Needs: Not on file  Physical Activity: Not on file  Stress: Not on file  Social Connections: Not on file   Family History  Problem Relation Age of Onset   GI problems Mother    Hypertension Mother    Hyperlipidemia Mother    Heart disease Mother    Diabetes Father    Hyperlipidemia Father    Hypertension Father    Atrial fibrillation Father    Heart attack Father    Osteoporosis Father    Colon polyps Father    Hyperlipidemia Sister    Dysphagia Sister    Heart disease Maternal Grandfather    Colon cancer Maternal Grandfather    Diabetes Paternal Grandmother    Heart disease Paternal Grandmother    Outpatient Encounter Medications as of 09/09/2021  Medication Sig   aspirin 81 MG EC tablet Take 81 mg by mouth daily. Swallow whole.   Dextromethorphan-guaiFENesin (GERI-TUSSIN DM) 10-100 MG/5ML liquid Take 10 mLs by mouth daily as  needed.   docusate sodium (COLACE) 100 MG capsule Take 100 mg by mouth daily.   methocarbamol (ROBAXIN) 500 MG tablet Take 1 tablet by mouth every 8 (eight) hours as needed.   acetaminophen (TYLENOL) 325 MG tablet Take 650 mg by mouth every 6 (six) hours as needed.   Albuterol Sulfate (PROAIR RESPICLICK) 076 (90 Base) MCG/ACT AEPB Inhale into the lungs.   allopurinol (ZYLOPRIM) 100 MG tablet Take 100 mg by mouth daily.   ascorbic acid (VITAMIN C) 500 MG tablet Take 1 tablet (500 mg total) by mouth daily.   Cholecalciferol 50 MCG (2000 UT) CAPS Take by mouth.   diclofenac Sodium (VOLTAREN) 1 % GEL Apply topically in the morning, at noon, and at bedtime.   escitalopram (LEXAPRO) 20 MG tablet Take 1 tablet (20 mg total) by mouth daily.   ferrous sulfate 325 (65 FE) MG tablet Take 1 tablet (325 mg total) by mouth daily with breakfast.   furosemide (LASIX) 20 MG tablet Take 20 mg by mouth daily.   hydrochlorothiazide (MICROZIDE) 12.5 MG capsule Take  12.5 mg by mouth daily.   levothyroxine (SYNTHROID) 50 MCG tablet Take 1 tablet (50 mcg total) by mouth daily before breakfast.   LORazepam (ATIVAN) 0.5 MG tablet Take 0.5 mg by mouth 2 (two) times daily.   lovastatin (MEVACOR) 40 MG tablet Take 1 tablet (40 mg total) by mouth at bedtime.   metoprolol tartrate (LOPRESSOR) 25 MG tablet Take 12.5 mg by mouth 2 (two) times daily.   MUCINEX 600 MG 12 hr tablet Take 600 mg by mouth 2 (two) times daily.   nystatin (MYCOSTATIN/NYSTOP) powder 3 (three) times daily.   OLANZapine (ZYPREXA) 2.5 MG tablet Take 2.5 mg by mouth at bedtime.   pantoprazole (PROTONIX) 40 MG tablet TAKE 1 TABLET DAILY   polyvinyl alcohol (LIQUIFILM TEARS) 1.4 % ophthalmic solution 1 drop as needed for dry eyes.   potassium chloride 20 MEQ/15ML (10%) SOLN Take 7.5 mLs by mouth daily.   traMADol (ULTRAM) 50 MG tablet Take 50 mg by mouth 4 (four) times daily.   XARELTO 15 MG TABS tablet Take 15 mg by mouth 2 (two) times daily.   [DISCONTINUED] albuterol (VENTOLIN HFA) 108 (90 Base) MCG/ACT inhaler Inhale 1-2 puffs into the lungs every 6 (six) hours as needed for wheezing or shortness of breath.   [DISCONTINUED] aspirin 325 MG tablet Take 325 mg by mouth daily.   [DISCONTINUED] dextromethorphan-guaiFENesin (MUCINEX DM) 30-600 MG 12hr tablet Take 1 tablet by mouth 2 (two) times daily.   [DISCONTINUED] LORazepam (ATIVAN) 2 MG/ML injection    No facility-administered encounter medications on file as of 09/09/2021.   ALLERGIES: Allergies  Allergen Reactions   Keflex [Cephalexin] Other (See Comments)    Dizzy, lightheaded, falls   Doxycycline Rash    VACCINATION STATUS: Immunization History  Administered Date(s) Administered   Influenza, High Dose Seasonal PF 05/14/2016, 05/06/2017, 05/09/2018   Influenza,inj,Quad PF,6+ Mos 05/18/2013, 05/07/2014, 05/06/2015   Influenza-Unspecified 05/09/2020   Moderna Sars-Covid-2 Vaccination 09/06/2019, 10/04/2019, 06/18/2020   Pneumococcal  Conjugate-13 01/17/2014   Pneumococcal Polysaccharide-23 07/01/2015   Tdap 05/31/2011, 05/11/2020   Zoster Recombinat (Shingrix) 07/04/2017, 10/18/2017   Zoster, Live 03/04/2014    HPI Renee Davidson is 71 y.o. female who presents today with a medical history as above. she is being seen in follow-up after she was seen in consultation for nodular goiter, hypothyroidism requested by her PCP.  She was seen last year, diagnosed with thyroid cancer and was referred to Dr.  Gerkin for surgery. After she saw him, she sustained fall and fracture of RUE. That led to cancelling her thyroid surgery. She wishes to resume her care with Dr. Harlow Asa. See notes from previous visits.    She is accompanied by her [name] to the clinic.  She is known to have hypothyroidism for at least 10 years, currently on Synthroid 50 mcg p.o. daily before breakfast.  She does not have recent  thyroid function tests.   In November 2021 she was found to have incidental thyroid nodule of around the right lobe of her thyroid on CT scan of the chest.  Subsequent ultrasound confirmed 3 cm nodule on the right lobe with suspicious features.  She underwent fine-needle aspiration on November 24 which revealed atypia of undetermined significance.  A sample was sent for Afirma, results are consistent with approximately 50% risk of malignancy.  She was hospitalized in the interval for right humeral fracture which needed open reduction and internal fixation.  She has recovered from her  surgery.   She denies dysphagia, shortness of breath, cough, no voice change.  She has no family history of thyroid malignancy.  She reports minimally fluctuating body weight.  She uses a walker to get around due to disequilibrium.     Review of Systems  Constitutional: no recent weight gain/loss, no fatigue, no subjective hyperthermia, no subjective hypothermia   Objective:    Vitals with BMI 09/09/2021 05/27/2021 05/26/2021  Height 5' 5.5" 5' 4.25"  5' 5.5"  Weight 254 lbs 6 oz 254 lbs 255 lbs  BMI 41.68 00.86 76.19  Systolic 509 326 712  Diastolic 62 70 81  Pulse 64 64 70    BP 112/62    Pulse 64    Ht 5' 5.5" (1.664 m)    Wt 254 lb 6.4 oz (115.4 kg)    BMI 41.69 kg/m   Wt Readings from Last 3 Encounters:  09/09/21 254 lb 6.4 oz (115.4 kg)  05/27/21 254 lb (115.2 kg)  05/26/21 255 lb (115.7 kg)    Physical Exam  Constitutional:  Body mass index is 41.69 kg/m.,  not in acute distress, normal state of mind Eyes: PERRLA, EOMI, no exophthalmos ENT: moist mucous membranes, + palpable thyroid,  gross cervical lymphadenopathy   CMP ( most recent) CMP     Component Value Date/Time   NA 139 07/10/2020 1942   NA 139 02/22/2020 1252   K 4.3 07/10/2020 1942   CL 105 07/10/2020 1942   CO2 23 07/10/2020 1942   GLUCOSE 92 07/10/2020 1942   BUN 14 07/10/2020 1942   BUN 11 02/22/2020 1252   CREATININE 0.90 07/10/2020 1942   CALCIUM 9.1 07/10/2020 1942   PROT 7.6 07/10/2020 1942   PROT 6.6 02/22/2020 1252   ALBUMIN 4.0 07/10/2020 1942   ALBUMIN 4.0 02/22/2020 1252   AST 20 07/10/2020 1942   ALT 16 07/10/2020 1942   ALKPHOS 109 07/10/2020 1942   BILITOT 0.6 07/10/2020 1942   BILITOT 0.3 02/22/2020 1252   GFRNONAA >60 07/10/2020 1942   GFRAA >60 04/19/2020 1219     Diabetic Labs (most recent): Lab Results  Component Value Date   HGBA1C 5.7 06/08/2017     Lipid Panel ( most recent) Lipid Panel     Component Value Date/Time   CHOL 163 02/22/2020 1252   TRIG 74 02/22/2020 1252   TRIG 49 12/18/2014 1209   HDL 53 02/22/2020 1252   HDL 55 12/18/2014 1209   CHOLHDL 3.1 02/22/2020  [place] 96 02/22/2020 1252   LDLCALC 89 01/17/2014 0946   LABVLDL 14 02/22/2020 1252      Lab Results  Component Value Date   TSH 0.893 02/22/2020   TSH 0.761 12/04/2019   TSH 2.407 10/31/2019   TSH 2.130 03/26/2019   TSH 1.360 07/07/2018   TSH 0.282 (L) 05/09/2018   TSH 0.428 (L) 11/04/2017   TSH 0.727 06/06/2017   TSH  1.420 12/06/2016   TSH 0.927 04/23/2016    Fine-needle aspiration of right lobe thyroid nodule on June 25, 2020 FINAL MICROSCOPIC DIAGNOSIS:  - Atypia of undetermined significance (Bethesda category III)   SPECIMEN ADEQUACY:  Satisfactory for evaluation   DIAGNOSTIC COMMENTS:  This specimen will be sent for Afirma testing.   Molecular pathology results were received, indicating approximately 50% risk of malignancy.   Assessment & Plan:   1.  Malignant neoplasm of the thyroid 2. Acquired hypothyroidism   See note above. Patient could not keep her surgery appointments due to various reason  last year including humoral fracture. She wishes to resume her care with Dr. Harlow Asa. I advised her sister to proceed and call his clinic for appointment.  I ordered CT Neck, CT Chest for staging purposes.   In light of her longstanding hypothyroidism, she is advised to continue  Synthroid 50 mcg p.o. daily before breakfast.   - We discussed about the correct intake of her thyroid hormone, on empty stomach at fasting, with water, separated by at least 30 minutes from breakfast and other medications,  and separated by more than 4 hours from calcium, iron, multivitamins, acid reflux medications (PPIs). -Patient is made aware of the fact that thyroid hormone replacement is needed for life, dose to be adjusted by periodic monitoring of thyroid function tests.   - I did not initiate any new prescriptions today. - she is advised to maintain close follow up with Patient, No Pcp Per (Inactive) for primary care needs.      I spent 31 minutes in the care of the patient today including review of labs from Thyroid Function, CMP, and other relevant labs ; imaging/biopsy records (current and previous including abstractions from other facilities); face-to-face time discussing  her lab results and symptoms, medications doses, her options of short and long term treatment based on the latest standards of care  / guidelines;   and documenting the encounter.  Garvin Fila Zelek  participated in the discussions, expressed understanding, and voiced agreement with the above plans.  All questions were answered to her satisfaction. she is encouraged to contact clinic should she have any questions or concerns prior to her return visit.    Follow up plan: Return for F/U with Labs after Surgery.   Glade Lloyd, MD [facility] [address] [place], [place] [address] Phone: [phone]  Fax: [phone]     09/09/2021, 7:11 PM  This note was partially dictated with voice recognition software. Similar sounding words can be transcribed inadequately or may not  be corrected upon review.

## 2021-09-24 ENCOUNTER — Other Ambulatory Visit: Payer: Self-pay

## 2021-09-24 ENCOUNTER — Ambulatory Visit (HOSPITAL_COMMUNITY)
Admission: RE | Admit: 2021-09-24 | Discharge: 2021-09-24 | Disposition: A | Discharge status: Home / Self Care | Payer: Medicare Other | Source: Ambulatory Visit | Attending: "Endocrinology | Admitting: "Endocrinology

## 2021-09-24 DIAGNOSIS — C73 Malignant neoplasm of thyroid gland: Secondary | ICD-10-CM | POA: Insufficient documentation

## 2021-09-24 LAB — POCT I-STAT CREATININE: Creatinine, Ser: 1.1 mg/dL — ABNORMAL HIGH (ref 0.44–1.00)

## 2021-09-24 MED ORDER — IOHEXOL 300 MG/ML  SOLN
75.0000 mL | Freq: Once | INTRAMUSCULAR | Status: AC | PRN
  Administered 2021-09-24: 75 mL via INTRAVENOUS

## 2021-10-05 ENCOUNTER — Encounter: Payer: Self-pay | Admitting: Neurology

## 2021-10-05 ENCOUNTER — Telehealth: Payer: Self-pay | Admitting: *Deleted

## 2021-10-05 NOTE — Progress Notes (Signed)
Clearance letter sent

## 2021-10-05 NOTE — Telephone Encounter (Signed)
Received request for surgery clearance for thyroid surgery under general anesthesia.  ? ?Dr. April Manson generated letter and it was faxed back to [facility]. Confirmation received.  ? ?Atten: Mammie Lorenzo, LPN  ?Ph: [phone] ?Fax: [phone] ? ?Note: ? ?Renee Davidson, DOB 16-Mar-1951, is a patient of my neurology clinic who was last seen 05/26/2021. She needs neurology clearance for thyroid surgery under general anesthesia. She did have adverse reactions to anesthesia in past described as confusion and difficulty to arouse during recovery. ?Due to her weight and BMI, she might have confusion and delay in recovery post anesthesia but this is not an absolute contraindication to her thyroid surgery.  ? ?

## 2021-10-16 NOTE — Progress Notes (Signed)
Sent message, via epic in basket, requesting orders in epic from surgeon.  

## 2021-10-17 ENCOUNTER — Ambulatory Visit: Payer: Self-pay | Admitting: Surgery

## 2021-10-17 NOTE — H&P (Signed)
? ? ? ?PROVIDER: Arneda Sappington Charlotta Newton, MD ? ?Chief Complaint: Follow-up (Thyroid neoplasm of uncertain behavior) ? ? ?History of Present Illness: ? ?Patient is referred by Dr. 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 Patient was originally evaluated in March 2022. Due to a variety of medical and social issues, the patient never came to surgery for right thyroid lobectomy for definitive diagnosis and management. Patient has had multiple studies including ultrasounds and CT scans. Previous biopsy showed atypia of undetermined significance, Bethesda category III. Specimen was submitted for molecular genetic testing, AFIRMA, and returned with a result of suspicious, rendering a risk of malignancy of approximately 50%. Patient is on levothyroxine, 50 mcg daily. Patient has had no prior head or neck surgery. Patient has had recent surgery by orthopedic surgery for repair of a right shoulder humerus fracture. Patient is currently on anticoagulation due to deep venous thrombosis. She is scheduled to see her hematologist later this month. She is currently taking Xarelto. Patient is also followed by cardiology, neurology, and pulmonary medicine. Patient is accompanied by her sister. They present today to discuss proceeding with right thyroid lobectomy for management of a thyroid neoplasm of uncertain behavior.  ? ?Review of Systems: ?A complete review of systems was obtained from the patient. I have reviewed this information and discussed as appropriate with the patient. See HPI as well for other ROS. ? ?Review of Systems  ?Constitutional: Negative.  ?HENT: Negative.  ?Eyes: Negative.  ?Respiratory: Negative.  ?Cardiovascular: Negative.  ?Gastrointestinal: Negative.  ?Genitourinary: Negative.  ?Musculoskeletal: Negative.  ?Skin: Negative.  ?Neurological: Negative.  ?Endo/Heme/Allergies: Negative.  ?Psychiatric/Behavioral: Negative.   ? ? ?Medical History: ?Past Medical History:  ?Diagnosis Date  ? Anxiety  ? Arthritis  ? DVT (deep venous thrombosis) (CMS-HCC)  ? GERD (gastroesophageal reflux disease)  ? Hyperlipidemia  ? Thyroid disease  ? ?Patient Active Problem List  ?Diagnosis  ? Neoplasm of uncertain behavior of thyroid gland  ? Thyroid nodule  ? ?Past Surgical History:  ?Procedure Laterality Date  ? repair broken leg and right arm N/A  ? ? ?Not on File ? ?Current Outpatient Medications on File Prior to Visit  ?Medication Sig Dispense Refill  ? allopurinoL (ZYLOPRIM) 100 MG tablet Take 100 mg by mouth once daily  ? guaiFENesin (MUCINEX) 600 mg SR tablet Take 600 mg by mouth 2 (two) times daily  ? hydroCHLOROthiazide (MICROZIDE) 12.5 mg capsule Take by mouth  ? pantoprazole (PROTONIX) 40 MG DR tablet Take 1 tablet by mouth once daily  ? potassium chloride 20 mEq/15 mL oral solution Take by mouth  ? acetaminophen (TYLENOL) 325 MG tablet Take 650 mg by mouth every 6 (six) hours as needed  ? albuterol (PROAIR RESPICLICK) 90 mcg/actuation inhaler Inhale into the lungs  ? ascorbic acid, vitamin C, (VITAMIN C) 250 MG tablet  ? aspirin 81 MG EC tablet Take by mouth  ? clindamycin (CLEOCIN) 300 MG capsule  ? dextromethorphan-guaifenesin (ROBITUSSIN-DM) 10-100 mg/5 mL liquid Take by mouth  ? docusate (COLACE) 100 MG capsule Take 100 mg by mouth once daily  ? escitalopram oxalate (LEXAPRO) 20 MG tablet  ? FEROSUL 325 mg (65 mg iron) tablet  ? FUROsemide (LASIX) 20 MG tablet  ? ipratropium-albuteroL (DUO-NEB) nebulizer solution  ? levoFLOXacin (LEVAQUIN) 750 MG tablet  ? levothyroxine (SYNTHROID) 50 MCG tablet  ? LORazepam (ATIVAN) 0.5 MG tablet Take 0.5 mg by mouth 2 (two) times daily  ? lovastatin (MEVACOR)  40 MG tablet  ? methocarbamoL (ROBAXIN) 500 MG tablet  ? metoprolol tartrate (LOPRESSOR) 25 MG tablet  ? NYAMYC 100,000 unit/gram powder  ? OLANZapine (ZYPREXA) 2.5 MG tablet  ? traMADoL (ULTRAM) 50 mg tablet  ? XARELTO 20 mg tablet  ? ?No current  facility-administered medications on file prior to visit.  ? ?Family History  ?Problem Relation Age of Onset  ? Obesity Mother  ? High blood pressure (Hypertension) Mother  ? Hyperlipidemia (Elevated cholesterol) Mother  ? High blood pressure (Hypertension) Father  ? Hyperlipidemia (Elevated cholesterol) Father  ? Heart valve disease Father  ? Diabetes Father  ? Hyperlipidemia (Elevated cholesterol) Sister  ? ? ?Social History  ? ?Tobacco Use  ?Smoking Status Never  ?Smokeless Tobacco Never  ? ? ?Social History  ? ?Socioeconomic History  ? Marital status: Unknown  ?Tobacco Use  ? Smoking status: Never  ? Smokeless tobacco: Never  ?Vaping Use  ? Vaping Use: Never used  ?Substance and Sexual Activity  ? Alcohol use: Never  ? Drug use: Never  ? ?Objective:  ? ?Vitals:  ?Weight: (!) 115 kg (253 lb 9.6 oz)  ?Height: 166.4 cm (5' 5.5")  ? ?Body mass index is 41.56 kg/m?. ? ?Physical Exam  ? ?GENERAL APPEARANCE ?Development: normal ?Nutritional status: normal ?Gross deformities: none ?Patient seated in a wheelchair ? ?SKIN ?Rash, lesions, ulcers: none ?Induration, erythema: none ?Nodules: none palpable ? ?EYES ?Conjunctiva and lids: normal ?Pupils: equal and reactive ?Iris: normal bilaterally ? ?EARS, NOSE, MOUTH, THROAT ?External ears: no lesion or deformity ?External nose: no lesion or deformity ?Hearing: grossly normal ?Due to Covid-19 pandemic, patient is wearing a mask. ? ?NECK ?Symmetric: yes ?Trachea: midline ?Thyroid: There is a palpable smooth mobile nontender nodule in the mid right thyroid lobe, relatively firm, measuring approximately 2.5 cm in size. Left lobe is without palpable abnormality. ? ?CHEST ?Respiratory effort: normal ?Retraction or accessory muscle use: no ?Breath sounds: normal bilaterally ?Rales, rhonchi, wheeze: none ? ?CARDIOVASCULAR ?Auscultation: regular rhythm, normal rate ?Murmurs: none ?Pulses: radial pulse 2+ palpable ?Lower extremity edema: none ? ?LYMPHATIC ?Cervical: none  palpable ?Supraclavicular: none palpable ? ?PSYCHIATRIC ?Oriented to person, place, and time: yes ?Mood and affect: normal for situation ?Judgment and insight: appropriate for situation ? ?Assessment and Plan:  ? ?Neoplasm of uncertain behavior of thyroid gland ? ?Thyroid nodule ? ? ?Patient returns to my practice for follow-up of thyroid neoplasm of uncertain behavior. Patient has a solitary nodule in the right thyroid lobe. Previous fine-needle aspiration biopsy included molecular genetic testing, AFIRMA, which returned with a result of suspicious, rendering a risk of malignancy of approximately 50%. ? ?I have previously recommended that the patient undergo right thyroid lobectomy for definitive diagnosis and management. Due to a variety of medical, surgical, and social issues, the patient has not yet come to surgery. We will request clearance from her various specialty physicians. Patient is scheduled to see her hematologist later this month and will hopefully be able to stop her chronic anticoagulation. ? ?Today we discussed proceeding with right thyroid lobectomy. We again discussed the risk and benefits of the procedure including the risk of injury to recurrent laryngeal nerve and to parathyroid glands. We discussed doing this at [facility]. We discussed an overnight stay. The patient understands and wishes to proceed with planning for surgery in the near future. ? ?The risks and benefits of the procedure have been discussed at length with the patient. The patient understands the proposed procedure, potential alternative treatments, and  the course of recovery to be expected. All of the patient's questions have been answered at this time. The patient wishes to proceed with surgery.  ? ?Armandina Gemma, MD ?[facility] ?A [facility] practice ?Office: [phone] ? ? ?

## 2021-10-26 NOTE — Progress Notes (Addendum)
DUE TO COVID-19 ONLY ONE VISITOR IS ALLOWED TO COME WITH YOU AND STAY IN THE WAITING ROOM ONLY DURING PRE OP AND PROCEDURE DAY OF SURGERY.  2 VISITOR  MAY VISIT WITH YOU AFTER SURGERY IN YOUR PRIVATE ROOM DURING VISITING HOURS ONLY! ?YOU MAY HAVE ONE PERSON SPEND THE NITE WITH YOU IN YOUR ROOM AFTER SURGERY.   ? ? Your procedure is scheduled on:  ?                 11/09/2021  ? Report to [facility] Main  Entrance ? ? Report to admitting at        0515          AM ?DO NOT [place], PICTURE ID OR WALLET DAY OF SURGERY.  ?  ? ? Call this number if you have problems the morning of surgery [phone]  ? ? REMEMBER: NO  SOLID FOODS , CANDY, GUM OR MINTS AFTER MIDNITE THE NITE BEFORE SURGERY .       Marland Kitchen CLEAR LIQUIDS UNTIL    0430am            DAY OF SURGERY.  ENSURE DRINK PER SURGEON ORDER  WHICH NEEDS TO BE COMPLETED AT   0430am       MORNING OF SURGERY.   ? ? ? ? ?CLEAR LIQUID DIET ? ? ?Foods Allowed      ?WATER ?BLACK COFFEE ( SUGAR OK, NO MILK, CREAM OR CREAMER) REGULAR AND DECAF  ?TEA ( SUGAR OK NO MILK, CREAM, OR CREAMER) REGULAR AND DECAF  ?PLAIN JELLO ( NO RED)  ?FRUIT ICES ( NO RED, NO FRUIT PULP)  ?POPSICLES ( NO RED)  ?JUICE- APPLE, WHITE GRAPE AND WHITE CRANBERRY  ?SPORT DRINK LIKE GATORADE ( NO RED)  ?CLEAR BROTH ( VEGETABLE , CHICKEN OR BEEF)                                                               ? ?    ? ?BRUSH YOUR TEETH MORNING OF SURGERY AND RINSE YOUR MOUTH OUT, NO CHEWING GUM CANDY OR MINTS. ?  ? ? Take these medicines the morning of surgery with A SIP OF WATER:  inhalers as usual and bring,  lexa pro, allopurinol, synthroid, metoprolol, protonix  ? ? ?DO NOT TAKE ANY DIABETIC MEDICATIONS DAY OF YOUR SURGERY ?                  ?            You may not have any metal on your body including hair pins and  ?            piercings  Do not wear jewelry, make-up, lotions, powders or perfumes, deodorant ?            Do not wear nail polish on your fingernails.   ?           IF YOU  ARE A FEMALE AND WANT TO SHAVE UNDER ARMS OR LEGS PRIOR TO SURGERY YOU MUST DO SO AT LEAST 48 HOURS PRIOR TO SURGERY.  ?            Men may shave face and neck. ? ? Do not bring valuables to the hospital. [facility] [facility]  IS NOT ?            RESPONSIBLE   FOR VALUABLES. ? Contacts, dentures or bridgework may not be worn into surgery. ? Leave suitcase in the car. After surgery it may be brought to your room. ? ?  ? Patients discharged the day of surgery will not be allowed to drive home. IF YOU ARE HAVING SURGERY AND GOING HOME THE SAME DAY, YOU MUST HAVE AN ADULT TO DRIVE YOU HOME AND BE WITH YOU FOR 24 HOURS. YOU MAY GO HOME BY TAXI OR UBER OR ORTHERWISE, BUT AN ADULT MUST ACCOMPANY YOU HOME AND STAY WITH YOU FOR 24 HOURS. ?  ? ?            Please read over the following fact sheets you were given: ?_____________________________________________________________________ ? ?[place] - Preparing for Surgery ?Before surgery, you can play an important role.  Because skin is not sterile, your skin needs to be as free of germs as possible.  You can reduce the number of germs on your skin by washing with CHG (chlorahexidine gluconate) soap before surgery.  CHG is an antiseptic cleaner which kills germs and bonds with the skin to continue killing germs even after washing. ?Please DO NOT use if you have an allergy to CHG or antibacterial soaps.  If your skin becomes reddened/irritated stop using the CHG and inform your nurse when you arrive at Short Stay. ?Do not shave (including legs and underarms) for at least 48 hours prior to the first CHG shower.  You may shave your face/neck. ?Please follow these instructions carefully: ? 1.  Shower with CHG Soap the night before surgery and the  morning of Surgery. ? 2.  If you choose to wash your hair, wash your hair first as usual with your  normal  shampoo. ? 3.  After you shampoo, rinse your hair and body thoroughly to remove the  shampoo.                           4.  Use CHG as you  would any other liquid soap.  You can apply chg directly  to the skin and wash  ?                     Gently with a scrungie or clean washcloth. ? 5.  Apply the CHG Soap to your body ONLY FROM THE NECK DOWN.   Do not use on face/ open      ?                     Wound or open sores. Avoid contact with eyes, ears mouth and genitals (private parts).  ?                     Production manager,  Genitals (private parts) with your normal soap. ?            6.  Wash thoroughly, paying special attention to the area where your surgery  will be performed. ? 7.  Thoroughly rinse your body with warm water from the neck down. ? 8.  DO NOT shower/wash with your normal soap after using and rinsing off  the CHG Soap. ?               9.  Pat yourself dry with a clean towel. ?  10.  Wear clean pajamas. ?           11.  Place clean sheets on your bed the night of your first shower and do not  sleep with pets. ?Day of Surgery : ?Do not apply any lotions/deodorants the morning of surgery.  Please wear clean clothes to the hospital/surgery center. ? ?FAILURE TO FOLLOW THESE INSTRUCTIONS MAY RESULT IN THE CANCELLATION OF YOUR SURGERY ?PATIENT SIGNATURE_________________________________ ? ?NURSE SIGNATURE__________________________________ ? ?________________________________________________________________________  ? ? ?           ?

## 2021-10-26 NOTE — Progress Notes (Addendum)
Anesthesia Review: ? ?PCP: D RTejan-Sie- at [facility] in [place]  ?Cardiologist : DR Julien Nordmann- LOV 07/16/21.   ?[place]  ?Chest x-ray : 09/2921  ?Pulm- LOV- 11/10/20- DR Ander Slade  ?09/24/21- Ct chest  ?11/10/20- PFT  ?EKG :10/28/21  ?Echo : ?Stress test: ?Cardiac Cath :  ?Activity level: cannot do a flight of stairs  ?Sleep Study/ CPAP : none  ?Fasting Blood Sugar :      / Checks Blood Sugar -- times a day:   ?Blood Thinner/ Instructions /Last Dose: ?ASA / Instructions/ Last Dose :   ?81 mg Aspirin ?Xarelto - stopped on 10/27/21 per sister at MD visit with Lurlean Horns due to cellulitis in right arm- pt placed on Bactrim bid  on 10/27/21 per sister LOV 3/28/23Lurlean Horns in  epic  ?PT had right humerus shaft removal of deep hardware on 07/28/21 Seen on 10/13/21 for followoup.   ?Recent diagnosis of pneumonia by MD at [facility]- On levofloxacin untl 10/27/21 by Medication Record  sister who is with her at preop reports hx of having pneumonia  multiple times due to large breasts and large size.  Sister and pt report at preop that she does not have covid.  PT states she is tested at [facility] but does not know when she is tested.   ?[place] and left message with staff for nurse to fax most recent covid test results along with doumentation regarding diagnosis of pneumonia and treatment.  Call back number of [phone] givne and fax number given of [phone].  ?Shawn Stall made aware of recent diagnosis of pneumonia and cellulitis.  NO new orders given.  ?Nurse gives meds at [facility].  PT also has someone who helps with bathing and PT that also comes in .  PT lives in [facility] [place].   ?Copy of preop instructions given to sister for herself and a copy of preop instructions given for her to give to nurse at [facility].   ?Copy of med list , demographics, and medical hx made with original being given back to sister.   ?Sister was  informed at preop appt to call Dr Harlow Asa office and let him be aware of recent diagnosis of pneumonia and cellulitis in right arm.  Sister voiced understanding.  ?Attempted x 2 on 10/29/21 to reach [facility] to send them a fax to request info.  With no success.   ?

## 2021-10-28 ENCOUNTER — Encounter (HOSPITAL_COMMUNITY)
Admission: RE | Admit: 2021-10-28 | Discharge: 2021-10-28 | Disposition: A | Discharge status: Home / Self Care | Payer: Medicare Other | Source: Ambulatory Visit | Attending: Surgery | Admitting: Surgery

## 2021-10-28 ENCOUNTER — Ambulatory Visit (HOSPITAL_COMMUNITY)
Admission: RE | Admit: 2021-10-28 | Discharge: 2021-10-28 | Disposition: A | Discharge status: Home / Self Care | Payer: Medicare Other | Source: Ambulatory Visit | Attending: Anesthesiology | Admitting: Anesthesiology

## 2021-10-28 ENCOUNTER — Other Ambulatory Visit: Payer: Self-pay

## 2021-10-28 ENCOUNTER — Encounter (HOSPITAL_COMMUNITY): Payer: Self-pay

## 2021-10-28 VITALS — BP 123/70 | HR 81 | Temp 97.7°F | Resp 20 | Ht 65.5 in | Wt 256.0 lb

## 2021-10-28 DIAGNOSIS — E049 Nontoxic goiter, unspecified: Secondary | ICD-10-CM

## 2021-10-28 HISTORY — DX: Dyspnea, unspecified: R06.00

## 2021-10-28 HISTORY — DX: Heart failure, unspecified: I50.9

## 2021-10-28 HISTORY — DX: Pneumonia, unspecified organism: J18.9

## 2021-10-28 HISTORY — DX: Parkinson's disease: G20

## 2021-10-28 HISTORY — DX: Parkinson's disease without dyskinesia, without mention of fluctuations: G20.A1

## 2021-10-28 HISTORY — DX: History of falling: Z91.81

## 2021-10-28 HISTORY — DX: Hypothyroidism, unspecified: E03.9

## 2021-10-28 HISTORY — DX: Malignant (primary) neoplasm, unspecified: C80.1

## 2021-10-28 HISTORY — DX: Unspecified fracture of shaft of humerus, right arm, initial encounter for closed fracture: S42.301A

## 2021-10-28 HISTORY — DX: Cardiac arrhythmia, unspecified: I49.9

## 2021-10-28 LAB — CBC
HCT: 38.3 % (ref 36.0–46.0)
Hemoglobin: 11.9 g/dL — ABNORMAL LOW (ref 12.0–15.0)
MCH: 30.5 pg (ref 26.0–34.0)
MCHC: 31.1 g/dL (ref 30.0–36.0)
MCV: 98.2 fL (ref 80.0–100.0)
Platelets: 271 10*3/uL (ref 150–400)
RBC: 3.9 MIL/uL (ref 3.87–5.11)
RDW: 14.6 % (ref 11.5–15.5)
WBC: 6.6 10*3/uL (ref 4.0–10.5)
nRBC: 0 % (ref 0.0–0.2)

## 2021-10-28 LAB — BASIC METABOLIC PANEL
Anion gap: 8 (ref 5–15)
BUN: 18 mg/dL (ref 8–23)
CO2: 27 mmol/L (ref 22–32)
Calcium: 9 mg/dL (ref 8.9–10.3)
Chloride: 105 mmol/L (ref 98–111)
Creatinine, Ser: 0.88 mg/dL (ref 0.44–1.00)
GFR, Estimated: >60 mL/min (ref >60)
Glucose, Bld: 111 mg/dL — ABNORMAL HIGH (ref 70–99)
Potassium: 3.5 mmol/L (ref 3.5–5.1)
Sodium: 140 mmol/L (ref 135–145)

## 2021-10-29 NOTE — Progress Notes (Signed)
Anesthesia Chart Review ? ? Case: [id number] Date/Time: 11/09/21 0715  ? Procedure: RIGHT THYROID LOBECTOMY (Right)  ? Anesthesia type: General  ? Pre-op diagnosis: thyroid neoplasm  ? [facility] RO[facility] / [facility]  ? Surgeons: Armandina Gemma, MD  ? ?  ? ? ?DISCUSSION:71 y.o. never smoker with h/o GERD, CHF, DVT, Parkinson's disease, thyroid neoplasm scheduled for above procedure 11/09/2021 with Dr. Armandina Gemma.  ? ?Pt recently diagnosed with pneumonia.  Started on Levaquin 10/20/2021.  Discussed with Dr. Gala Lewandowsky office that surgery should be postponed 6-8 weeks following pneumonia diagnosis.    ?VS: BP 123/70   Pulse 81   Temp 36.5 ?C (Oral)   Resp 20   Ht 5' 5.5" (1.664 m)   Wt 116.1 kg   SpO2 98%   BMI 41.95 kg/m?  ? ?PROVIDERS: ?Jodi Marble, MD is PCP  ? ? ?LABS: Labs reviewed: Acceptable for surgery. ?(all labs ordered are listed, but only abnormal results are displayed) ? ?Labs Reviewed  ?BASIC METABOLIC PANEL - Abnormal; Notable for the following components:  ?    Result Value  ? Glucose, Bld 111 (*)   ? All other components within normal limits  ?CBC - Abnormal; Notable for the following components:  ? Hemoglobin 11.9 (*)   ? All other components within normal limits  ? ? ? ?IMAGES: ? ? ?EKG: ?10/28/2021 ?Rate 79 bpm  ?Normal sinus rhythm ?Low voltage QRS ?Abnormal ECG ?No significant change since last tracing ? ?CV: ?Echo 11/06/2020 ?Left Ventricle: Systolic function is normal. EF: 65-70%.  ?  Left Ventricle: Wall motion is normal.  ?  Tricuspid Valve: There is trace regurgitation. TR jet velocity  ?2.69msec.  ? ?No significant valvular disease noted. ?Past Medical History:  ?Diagnosis Date  ? Anxiety   ? Cancer ([facility]   ? malignant thyroid  ? CH[place] ([place]estive heart failure) ([facility]   ? hx of 09/2020 per note  ? Conversion disorder with seizures or convulsions   ? Depression   ? Diverticulosis   ? DVT (deep venous thrombosis) ([facility]   ? right  ? Dyspnea   ? Dysrhythmia   ? hx of SVT- 01/14/21 per note  ?  Essential hypertension   ? Fracture of right humerus   ? GERD (gastroesophageal reflux disease)   ? Hiatal hernia   ? Humerus fracture   ? right  ? Hx of falling   ? Hyperlipidemia   ? Hypothyroidism   ? Mental developmental delay   ? OCD (obsessive compulsive disorder)   ? Osteoporosis   ? Parkinson disease ([facility]   ? Parkinson's disease ([facility]   ? Peptic ulcer   ? Pneumonia   ? hx of pneumonal several times per sister at preop  ? Thyroid disease   ? Thyroid nodule   ? ? ?Past Surgical History:  ?Procedure Laterality Date  ? BIOPSY  11/02/2019  ? Procedure: BIOPSY;  Surgeon: GGatha Mayer MD;  [facility];  Service: Endoscopy;;  ? COLONOSCOPY WITH PROPOFOL N/A 11/02/2019  ? Procedure: COLONOSCOPY WITH PROPOFOL;  Surgeon: GGatha Mayer MD;  [facility];  Service: Endoscopy;  Laterality: N/A;  ? ESOPHAGOGASTRODUODENOSCOPY (EGD) WITH PROPOFOL N/A 11/01/2019  ? Procedure: ESOPHAGOGASTRODUODENOSCOPY (EGD) WITH PROPOFOL;  Surgeon: GGatha Mayer MD;  [facility];  Service: Endoscopy;  Laterality: N/A;  ? HUMERUS FRACTURE SURGERY Right 08/13/2020  ? TONSILLECTOMY    ? ? ?MEDICATIONS: ? Acetaminophen 500 MG capsule  ? Albuterol Sulfate (PROAIR RESPICLICK)  108 (90 Base) MCG/ACT AEPB  ? allopurinol (ZYLOPRIM) 100 MG tablet  ? ascorbic acid (VITAMIN C) 500 MG tablet  ? aspirin 81 MG EC tablet  ? calcium carbonate (TUMS - DOSED IN MG ELEMENTAL CALCIUM) 500 MG chewable tablet  ? Cholecalciferol 50 MCG (2000 UT) CAPS  ? dextromethorphan (DELSYM) 30 MG/5ML liquid  ? Dextromethorphan-guaiFENesin 10-100 MG/5ML liquid  ? diclofenac Sodium (VOLTAREN) 1 % GEL  ? docusate sodium (COLACE) 100 MG capsule  ? escitalopram (LEXAPRO) 20 MG tablet  ? ferrous sulfate 325 (65 FE) MG tablet  ? furosemide (LASIX) 20 MG tablet  ? hydrochlorothiazide (MICROZIDE) 12.5 MG capsule  ? levothyroxine (SYNTHROID) 50 MCG tablet  ? LORazepam (ATIVAN) 0.5 MG tablet  ? lovastatin (MEVACOR) 40 MG tablet  ? methocarbamol  (ROBAXIN) 500 MG tablet  ? metoprolol tartrate (LOPRESSOR) 25 MG tablet  ? mineral oil-hydrophilic petrolatum (AQUAPHOR) ointment  ? MUCINEX 600 MG 12 hr tablet  ? nystatin (MYCOSTATIN/NYSTOP) powder  ? OLANZapine (ZYPREXA) 2.5 MG tablet  ? pantoprazole (PROTONIX) 40 MG tablet  ? polyethylene glycol (MIRALAX / GLYCOLAX) 17 g packet  ? polyvinyl alcohol (LIQUIFILM TEARS) 1.4 % ophthalmic solution  ? potassium chloride 20 MEQ/15ML (10%) SOLN  ? rivaroxaban (XARELTO) 20 MG TABS tablet  ? traMADol (ULTRAM) 50 MG tablet  ? vitamin C (ASCORBIC ACID) 250 MG tablet  ? ?No current facility-administered medications for this encounter.  ? ? ? ?Konrad Felix Ward, PA-C ?[facility] ?[phone] ? ? ? ? ? ?

## 2021-10-30 NOTE — Progress Notes (Addendum)
Called and received fax number from [facility].  Faxed to them requesting current MAR, Mar to be sent on day of surgery with dates and times of last meds administered.  And current info regarding pneumonia diagnosis approx 10/17/21 and most recent covid test results.   ?On 11/02/2021 called and spoke with nurse at [facility] who takes care of pt and rquested most current Mar, most recent ov note of diagnosis of covid and most recent covid test results.  Also requested MAR to be sent with pt on DOS with last dates and times medication administered.  Nurse reports at time of phone call she has copy of preop instructions.   ?

## 2021-11-03 NOTE — Progress Notes (Signed)
REceived from [facility] 10/20/21 encounter related to followup of pneumonia.  Report on chart.  ?Facility sent a note that they will do a covid test day before srugery and send results.   ?Also received a copy of current MAR.   ?

## 2021-11-03 NOTE — Progress Notes (Signed)
Called pharmacy and LVMM at [phone] in regards to had received new med list from [facility] and 2 new meds listed and see if they wanted me to fax them current medilist.  Gave call back number.   ?

## 2021-11-05 NOTE — Progress Notes (Addendum)
Spoke with [name], nurse at [facility] who takes care of pt after nurse saw on MR from [place] a comment in regards to taking meds with applesauce.  Beth at [facility] stated that if pills are small pt is able to swallow her meds without appleasauce and pt swallows meds without applesauce.  Reviewed with nurse, [name] at [facility] that it is listed on page 9 of the MAR/ORders [facility] faxed back to  to [facility] and on chart what meds she is to take am of surgery.  Nurse , [name] at [facility] voiced understanding/ ?Corporate treasurer, Marine scientist at [facility] , stated in phone call that the applesauce order noted on the MAR/Orders is from incident that happened and pt choked on some pills some time in the past .  Pt is able to swallow pills with water.   ?

## 2021-11-26 NOTE — Progress Notes (Signed)
Have called and requested current Mar, med hx and demographic sheet from [facility].  They are to fax.  ?

## 2021-11-30 NOTE — Progress Notes (Addendum)
DUE TO COVID-19 ONLY ONE VISITOR IS ALLOWED TO COME WITH YOU AND STAY IN THE WAITING ROOM ONLY DURING PRE OP AND PROCEDURE DAY OF SURGERY.  2 VISITOR  MAY VISIT WITH YOU AFTER SURGERY IN YOUR PRIVATE ROOM DURING VISITING HOURS ONLY! ?YOU MAY HAVE ONE PERSON SPEND THE NITE WITH YOU IN YOUR ROOM AFTER SURGERY.   ? ? Your procedure is scheduled on:  ?           12/21/21  ? Report to [facility] Main  Entrance ? ? Report to admitting at       0715           AM ?DO NOT [place], PICTURE ID OR WALLET DAY OF SURGERY.  ?  ? ? Call this number if you have problems the morning of surgery [phone]  ? ? REMEMBER: NO  SOLID FOODS , CANDY, GUM OR MINTS AFTER MIDNITE THE NITE BEFORE SURGERY .       Marland Kitchen CLEAR LIQUIDS UNTIL   0630am              DAY OF SURGERY.      PLEASE FINISH ENSURE DRINK PER SURGEON ORDER  WHICH NEEDS TO BE COMPLETED AT     0630am       MORNING OF SURGERY.   ? ? ? ? ?CLEAR LIQUID DIET ? ? ?Foods Allowed      ?WATER ?BLACK COFFEE ( SUGAR OK, NO MILK, CREAM OR CREAMER) REGULAR AND DECAF  ?TEA ( SUGAR OK NO MILK, CREAM, OR CREAMER) REGULAR AND DECAF  ?PLAIN JELLO ( NO RED)  ?FRUIT ICES ( NO RED, NO FRUIT PULP)  ?POPSICLES ( NO RED)  ?JUICE- APPLE, WHITE GRAPE AND WHITE CRANBERRY  ?SPORT DRINK LIKE GATORADE ( NO RED)  ?CLEAR BROTH ( VEGETABLE , CHICKEN OR BEEF)                                                               ? ?    ? ?BRUSH YOUR TEETH MORNING OF SURGERY AND RINSE YOUR MOUTH OUT, NO CHEWING GUM CANDY OR MINTS. ?  ? ? Take these medicines the morning of surgery with A SIP OF WATER:  ?Inhalers as usual and bring, allopurinol, lexapro, synthroid, metoprolol, protonix  ? ? ?DO NOT TAKE ANY DIABETIC MEDICATIONS DAY OF YOUR SURGERY ?                  ?            You may not have any metal on your body including hair pins and  ?            piercings  Do not wear jewelry, make-up, lotions, powders or perfumes, deodorant ?            Do not wear nail polish on your fingernails.   ?            IF YOU ARE A FEMALE AND WANT TO SHAVE UNDER ARMS OR LEGS PRIOR TO SURGERY YOU MUST DO SO AT LEAST 48 HOURS PRIOR TO SURGERY.  ?            Men may shave face and neck. ? ? Do not bring valuables to the hospital. [facility] IS NOT ?            RESPONSIBLE   FOR VALUABLES. ? Contacts, dentures or bridgework may not be worn into surgery. ? Leave suitcase in the car. After surgery it may be brought to your room. ? ?  ? Patients discharged the day of surgery will not be allowed to drive home. IF YOU ARE HAVING SURGERY AND GOING HOME THE SAME DAY, YOU MUST HAVE AN ADULT TO DRIVE YOU HOME AND BE WITH YOU FOR 24 HOURS. YOU MAY GO HOME BY TAXI OR UBER OR ORTHERWISE, BUT AN ADULT MUST ACCOMPANY YOU HOME AND STAY WITH YOU FOR 24 HOURS. ?  ? ?            Please read over the following fact sheets you were given: ?_____________________________________________________________________ ? ?[place] - Preparing for Surgery ?Before surgery, you can play an important role.  Because skin is not sterile, your skin needs to be as free of germs as possible.  You can reduce the number of germs on your skin by washing with CHG (chlorahexidine gluconate) soap before surgery.  CHG is an antiseptic cleaner which kills germs and bonds with the skin to continue killing germs even after washing. ?Please DO NOT use if you have an allergy to CHG or antibacterial soaps.  If your skin becomes reddened/irritated stop using the CHG and inform your nurse when you arrive at Short Stay. ?Do not shave (including legs and underarms) for at least 48 hours prior to the first CHG shower.  You may shave your face/neck. ?Please follow these instructions carefully: ? 1.  Shower with CHG Soap the night before surgery and the  morning of Surgery. ? 2.  If you choose to wash your hair, wash your hair first as usual with your  normal  shampoo. ? 3.  After you shampoo, rinse your hair and body thoroughly to remove the  shampoo.                           4.  Use  CHG as you would any other liquid soap.  You can apply chg directly  to the skin and wash  ?                     Gently with a scrungie or clean washcloth. ? 5.  Apply the CHG Soap to your body ONLY FROM THE NECK DOWN.   Do not use on face/ open      ?                     Wound or open sores. Avoid contact with eyes, ears mouth and genitals (private parts).  ?                     Production manager,  Genitals (private parts) with your normal soap. ?            6.  Wash thoroughly, paying special attention to the area where your surgery  will be performed. ? 7.  Thoroughly rinse your body with warm water from the neck down. ? 8.  DO NOT shower/wash with your normal soap after using and rinsing off  the CHG Soap. ?               9.  Pat yourself dry with a clean towel. ?  10.  Wear clean pajamas. ?           11.  Place clean sheets on your bed the night of your first shower and do not  sleep with pets. ?Day of Surgery : ?Do not apply any lotions/deodorants the morning of surgery.  Please wear clean clothes to the hospital/surgery center. ? ?FAILURE TO FOLLOW THESE INSTRUCTIONS MAY RESULT IN THE CANCELLATION OF YOUR SURGERY ?PATIENT SIGNATURE_________________________________ ? ?NURSE SIGNATURE__________________________________ ? ?________________________________________________________________________  ? ? ?           ?

## 2021-12-01 ENCOUNTER — Encounter (HOSPITAL_COMMUNITY): Payer: Self-pay

## 2021-12-01 ENCOUNTER — Ambulatory Visit (HOSPITAL_COMMUNITY)
Admission: RE | Admit: 2021-12-01 | Discharge: 2021-12-01 | Disposition: A | Discharge status: Home / Self Care | Payer: Medicare Other | Source: Ambulatory Visit | Attending: Anesthesiology | Admitting: Anesthesiology

## 2021-12-01 ENCOUNTER — Encounter (HOSPITAL_COMMUNITY)
Admission: RE | Admit: 2021-12-01 | Discharge: 2021-12-01 | Disposition: A | Discharge status: Home / Self Care | Payer: Medicare Other | Source: Ambulatory Visit | Attending: Surgery | Admitting: Surgery

## 2021-12-01 ENCOUNTER — Other Ambulatory Visit: Payer: Self-pay

## 2021-12-01 VITALS — BP 146/79 | HR 63 | Temp 97.5°F | Resp 16 | Ht 65.5 in

## 2021-12-01 DIAGNOSIS — Z01818 Encounter for other preprocedural examination: Secondary | ICD-10-CM | POA: Insufficient documentation

## 2021-12-01 LAB — CBC
HCT: 40.7 % (ref 36.0–46.0)
Hemoglobin: 12.3 g/dL (ref 12.0–15.0)
MCH: 29.4 pg (ref 26.0–34.0)
MCHC: 30.2 g/dL (ref 30.0–36.0)
MCV: 97.1 fL (ref 80.0–100.0)
Platelets: 346 10*3/uL (ref 150–400)
RBC: 4.19 MIL/uL (ref 3.87–5.11)
RDW: 13.2 % (ref 11.5–15.5)
WBC: 6.9 10*3/uL (ref 4.0–10.5)
nRBC: 0 % (ref 0.0–0.2)

## 2021-12-01 LAB — BASIC METABOLIC PANEL
Anion gap: 7 (ref 5–15)
BUN: 14 mg/dL (ref 8–23)
CO2: 29 mmol/L (ref 22–32)
Calcium: 8.9 mg/dL (ref 8.9–10.3)
Chloride: 104 mmol/L (ref 98–111)
Creatinine, Ser: 0.92 mg/dL (ref 0.44–1.00)
GFR, Estimated: >60 mL/min (ref >60)
Glucose, Bld: 86 mg/dL (ref 70–99)
Potassium: 4 mmol/L (ref 3.5–5.1)
Sodium: 140 mmol/L (ref 135–145)

## 2021-12-01 NOTE — Progress Notes (Addendum)
Anesthesia Review: ? ?PCP: DR Genene Churn tejan-SIE  ?Cardiologist : none  ?Chest x-ray : 10/29/21  and 12/01/21  ?11/01/21- Ct chest  ?EKG : 10/28/21  ?Echo : ?Stress test: ?Cardiac Cath :  ?Activity level: cannot do a flight of stairs without difficuilty  ?Sleep Study/ CPAP : none  ?Fasting Blood Sugar :      / Checks Blood Sugar -- times a day:   ?Blood Thinner/ Instructions /Last Dose: ?ASA / Instructions/ Last Dose :   ?Have faxed on 12/01/21 [facility]op [facility] to [facility]  ?Xarelto- discontinued on 10/28/21 per [facility] on chart  ?Surgery cancelled in April 2023 due to recent hx of pneumonia surgery postponed untl 12/21/21/.   ?L[facility] at [facility]  ?Sisters with pt at preop appt on 12/01/21.  Copy of MAR, demographics and medical diagnosis on chart.  Copies of preop instructions given to sisters and one copy to take back with to [facility].  2 bottles of hibiclens and ensure given to sister to take to [place].   ?Spoke with Merleen Milliner, nurse l at [place] on 12/01/21 and Vernie Shanks confirmed that [facility] has received copy of preop instructions.  Informed Vernie Shanks that pt is bringing back with her a copy of instructions also along with 2 bottles of hibiclens and Ensure presurgery drink.    Vernie Shanks  is aware pt is to take meds with water am of surgery and meds to take are listed on preop instructions.   ?At preop appt pt and sister stated pt had small irritated skin area from a bandaid on upper center chest.  Noted.  Infomed sister and patient to watch area and to notify Dr Harlow Asa if does not improve. Both pt and sister voiced understanding.   ?

## 2021-12-20 ENCOUNTER — Encounter (HOSPITAL_COMMUNITY): Payer: Self-pay | Admitting: Surgery

## 2021-12-20 DIAGNOSIS — D440 Neoplasm of uncertain behavior of thyroid gland: Secondary | ICD-10-CM | POA: Diagnosis present

## 2021-12-20 DIAGNOSIS — E041 Nontoxic single thyroid nodule: Secondary | ICD-10-CM | POA: Diagnosis present

## 2021-12-20 NOTE — H&P (Signed)
PROVIDER: Miracle Criado Charlotta Newton, MD   Chief Complaint: Follow-up (Thyroid neoplasm of uncertain behavior)   History of Present Illness:  Patient is referred by Dr. 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 Patient was originally evaluated in March 2022. Due to a variety of medical and social issues, the patient never came to surgery for right thyroid lobectomy for definitive diagnosis and management. Patient has had multiple studies including ultrasounds and CT scans. Previous biopsy showed atypia of undetermined significance, Bethesda category III. Specimen was submitted for molecular genetic testing, AFIRMA, and returned with a result of suspicious, rendering a risk of malignancy of approximately 50%. Patient is on levothyroxine, 50 mcg daily. Patient has had no prior head or neck surgery. Patient has had recent surgery by orthopedic surgery for repair of a right shoulder humerus fracture. Patient is currently on anticoagulation due to deep venous thrombosis. She is scheduled to see her hematologist later this month. She is currently taking Xarelto. Patient is also followed by cardiology, neurology, and pulmonary medicine. Patient is accompanied by her sister. They present today to discuss proceeding with right thyroid lobectomy for management of a thyroid neoplasm of uncertain behavior.   Review of Systems: A complete review of systems was obtained from the patient. I have reviewed this information and discussed as appropriate with the patient. See HPI as well for other ROS.  Review of Systems  Constitutional: Negative.  HENT: Negative.  Eyes: Negative.  Respiratory: Negative.  Cardiovascular: Negative.  Gastrointestinal: Negative.  Genitourinary: Negative.  Musculoskeletal: Negative.  Skin: Negative.  Neurological: Negative.  Endo/Heme/Allergies: Negative.  Psychiatric/Behavioral: Negative.     Medical History: Past Medical History:  Diagnosis Date   Anxiety   Arthritis   DVT (deep venous thrombosis) (CMS-HCC)   GERD (gastroesophageal reflux disease)   Hyperlipidemia   Thyroid disease   Patient Active Problem List  Diagnosis   Neoplasm of uncertain behavior of thyroid gland   Thyroid nodule   Past Surgical History:  Procedure Laterality Date   repair broken leg and right arm N/A    Not on File  Current Outpatient Medications on File Prior to Visit  Medication Sig Dispense Refill   allopurinoL (ZYLOPRIM) 100 MG tablet Take 100 mg by mouth once daily   guaiFENesin (MUCINEX) 600 mg SR tablet Take 600 mg by mouth 2 (two) times daily   hydroCHLOROthiazide (MICROZIDE) 12.5 mg capsule Take by mouth   pantoprazole (PROTONIX) 40 MG DR tablet Take 1 tablet by mouth once daily   potassium chloride 20 mEq/15 mL oral solution Take by mouth   acetaminophen (TYLENOL) 325 MG tablet Take 650 mg by mouth every 6 (six) hours as needed   albuterol (PROAIR RESPICLICK) 90 mcg/actuation inhaler Inhale into the lungs   ascorbic acid, vitamin C, (VITAMIN C) 250 MG tablet   aspirin 81 MG EC tablet Take by mouth   clindamycin (CLEOCIN) 300 MG capsule   dextromethorphan-guaifenesin (ROBITUSSIN-DM) 10-100 mg/5 mL liquid Take by mouth   docusate (COLACE) 100 MG capsule Take 100 mg by mouth once daily   escitalopram oxalate (LEXAPRO) 20 MG tablet   FEROSUL 325 mg (65 mg iron) tablet   FUROsemide (LASIX) 20 MG tablet   ipratropium-albuteroL (DUO-NEB) nebulizer solution   levoFLOXacin (LEVAQUIN) 750 MG tablet   levothyroxine (SYNTHROID) 50 MCG tablet   LORazepam (ATIVAN) 0.5 MG tablet Take 0.5 mg by mouth 2 (two) times daily  lovastatin (MEVACOR) 40 MG tablet   methocarbamoL (ROBAXIN) 500 MG tablet   metoprolol tartrate (LOPRESSOR) 25 MG tablet   NYAMYC 100,000 unit/gram powder   OLANZapine (ZYPREXA) 2.5 MG tablet   traMADoL (ULTRAM) 50 mg tablet   XARELTO 20 mg tablet   No current  facility-administered medications on file prior to visit.   Family History  Problem Relation Age of Onset   Obesity Mother   High blood pressure (Hypertension) Mother   Hyperlipidemia (Elevated cholesterol) Mother   High blood pressure (Hypertension) Father   Hyperlipidemia (Elevated cholesterol) Father   Heart valve disease Father   Diabetes Father   Hyperlipidemia (Elevated cholesterol) Sister    Social History   Tobacco Use  Smoking Status Never  Smokeless Tobacco Never    Social History   Socioeconomic History   Marital status: Unknown  Tobacco Use   Smoking status: Never   Smokeless tobacco: Never  Vaping Use   Vaping Use: Never used  Substance and Sexual Activity   Alcohol use: Never   Drug use: Never   Objective:   Vitals:  Weight: (!) 115 kg (253 lb 9.6 oz)  Height: 166.4 cm (5' 5.5")   Body mass index is 41.56 kg/m.  Physical Exam   GENERAL APPEARANCE Development: normal Nutritional status: normal Gross deformities: none Patient seated in a wheelchair  SKIN Rash, lesions, ulcers: none Induration, erythema: none Nodules: none palpable  EYES Conjunctiva and lids: normal Pupils: equal and reactive Iris: normal bilaterally  EARS, NOSE, MOUTH, THROAT External ears: no lesion or deformity External nose: no lesion or deformity Hearing: grossly normal Due to Covid-19 pandemic, patient is wearing a mask.  NECK Symmetric: yes Trachea: midline Thyroid: There is a palpable smooth mobile nontender nodule in the mid right thyroid lobe, relatively firm, measuring approximately 2.5 cm in size. Left lobe is without palpable abnormality.  CHEST Respiratory effort: normal Retraction or accessory muscle use: no Breath sounds: normal bilaterally Rales, rhonchi, wheeze: none  CARDIOVASCULAR Auscultation: regular rhythm, normal rate Murmurs: none Pulses: radial pulse 2+ palpable Lower extremity edema: none  LYMPHATIC Cervical: none  palpable Supraclavicular: none palpable  PSYCHIATRIC Oriented to person, place, and time: yes Mood and affect: normal for situation Judgment and insight: appropriate for situation   Assessment and Plan:   Neoplasm of uncertain behavior of thyroid gland  Thyroid nodule  Patient returns to my practice for follow-up of thyroid neoplasm of uncertain behavior. Patient has a solitary nodule in the right thyroid lobe. Previous fine-needle aspiration biopsy included molecular genetic testing, AFIRMA, which returned with a result of suspicious, rendering a risk of malignancy of approximately 50%.  I have previously recommended that the patient undergo right thyroid lobectomy for definitive diagnosis and management. Due to a variety of medical, surgical, and social issues, the patient has not yet come to surgery. We will request clearance from her various specialty physicians. Patient is scheduled to see her hematologist later this month and will hopefully be able to stop her chronic anticoagulation.  Today we discussed proceeding with right thyroid lobectomy. We again discussed the risk and benefits of the procedure including the risk of injury to recurrent laryngeal nerve and to parathyroid glands. We discussed doing this at [facility]. We discussed an overnight stay. The patient understands and wishes to proceed with planning for surgery in the near future.  The risks and benefits of the procedure have been discussed at length with the patient. The patient understands the proposed procedure, potential alternative  treatments, and the course of recovery to be expected. All of the patient's questions have been answered at this time. The patient wishes to proceed with surgery.  Armandina Gemma, MD [facility] [place] practice Office: [phone]

## 2021-12-21 ENCOUNTER — Other Ambulatory Visit: Payer: Self-pay

## 2021-12-21 ENCOUNTER — Encounter (HOSPITAL_COMMUNITY): Payer: Self-pay | Admitting: Surgery

## 2021-12-21 ENCOUNTER — Ambulatory Visit (HOSPITAL_COMMUNITY): Payer: Medicare Other | Admitting: Physician Assistant

## 2021-12-21 ENCOUNTER — Ambulatory Visit (HOSPITAL_BASED_OUTPATIENT_CLINIC_OR_DEPARTMENT_OTHER): Payer: Medicare Other | Admitting: Anesthesiology

## 2021-12-21 ENCOUNTER — Ambulatory Visit (HOSPITAL_COMMUNITY)
Admission: RE | Admit: 2021-12-21 | Discharge: 2021-12-22 | Disposition: A | Discharge status: Home / Self Care | Payer: Medicare Other | Attending: Surgery | Admitting: Surgery

## 2021-12-21 ENCOUNTER — Encounter (HOSPITAL_COMMUNITY)
Admission: RE | Disposition: A | Discharge status: Home / Self Care | Payer: Self-pay | Source: Home / Self Care | Attending: Surgery

## 2021-12-21 DIAGNOSIS — E039 Hypothyroidism, unspecified: Secondary | ICD-10-CM | POA: Diagnosis not present

## 2021-12-21 DIAGNOSIS — D440 Neoplasm of uncertain behavior of thyroid gland: Secondary | ICD-10-CM | POA: Diagnosis present

## 2021-12-21 DIAGNOSIS — G20 Parkinson's disease: Secondary | ICD-10-CM | POA: Insufficient documentation

## 2021-12-21 DIAGNOSIS — F419 Anxiety disorder, unspecified: Secondary | ICD-10-CM

## 2021-12-21 DIAGNOSIS — K219 Gastro-esophageal reflux disease without esophagitis: Secondary | ICD-10-CM | POA: Insufficient documentation

## 2021-12-21 DIAGNOSIS — I10 Essential (primary) hypertension: Secondary | ICD-10-CM | POA: Insufficient documentation

## 2021-12-21 DIAGNOSIS — Z79899 Other long term (current) drug therapy: Secondary | ICD-10-CM | POA: Insufficient documentation

## 2021-12-21 DIAGNOSIS — E041 Nontoxic single thyroid nodule: Secondary | ICD-10-CM | POA: Diagnosis present

## 2021-12-21 HISTORY — PX: THYROID LOBECTOMY: SHX420

## 2021-12-21 SURGERY — LOBECTOMY, THYROID
Anesthesia: General | Laterality: Right

## 2021-12-21 MED ORDER — LACTATED RINGERS IV SOLN: INTRAVENOUS | Status: DC

## 2021-12-21 MED ORDER — ONDANSETRON HCL 4 MG/2ML IJ SOLN: 4.0000 mg | Freq: Four times a day (QID) | INTRAMUSCULAR | Status: DC | PRN

## 2021-12-21 MED ORDER — MIDAZOLAM HCL 2 MG/2ML IJ SOLN
INTRAMUSCULAR | Status: AC
  Filled 2021-12-21: qty 2

## 2021-12-21 MED ORDER — SUGAMMADEX SODIUM 500 MG/5ML IV SOLN
INTRAVENOUS | Status: DC | PRN
  Administered 2021-12-21: 400 mg via INTRAVENOUS

## 2021-12-21 MED ORDER — PHENYLEPHRINE 80 MCG/ML (10ML) SYRINGE FOR IV PUSH (FOR BLOOD PRESSURE SUPPORT)
PREFILLED_SYRINGE | INTRAVENOUS | Status: DC | PRN
  Administered 2021-12-21: 80 ug via INTRAVENOUS

## 2021-12-21 MED ORDER — ACETAMINOPHEN 325 MG PO TABS: 650.0000 mg | ORAL_TABLET | Freq: Four times a day (QID) | ORAL | Status: DC | PRN

## 2021-12-21 MED ORDER — TRAMADOL HCL 50 MG PO TABS: 50.0000 mg | ORAL_TABLET | Freq: Four times a day (QID) | ORAL | Status: DC | PRN

## 2021-12-21 MED ORDER — LIDOCAINE HCL (PF) 2 % IJ SOLN
INTRAMUSCULAR | Status: DC | PRN
  Administered 2021-12-21: 1.5 mg/kg/h via INTRADERMAL

## 2021-12-21 MED ORDER — ACETAMINOPHEN 10 MG/ML IV SOLN: 1000.0000 mg | Freq: Once | INTRAVENOUS | Status: DC | PRN

## 2021-12-21 MED ORDER — HYDROMORPHONE HCL 1 MG/ML IJ SOLN: 1.0000 mg | INTRAMUSCULAR | Status: DC | PRN

## 2021-12-21 MED ORDER — SUCCINYLCHOLINE CHLORIDE 200 MG/10ML IV SOSY
PREFILLED_SYRINGE | INTRAVENOUS | Status: DC | PRN
  Administered 2021-12-21: 120 mg via INTRAVENOUS

## 2021-12-21 MED ORDER — HYDROCHLOROTHIAZIDE 12.5 MG PO CAPS
12.5000 mg | ORAL_CAPSULE | Freq: Every day | ORAL | Status: DC
Start: 2021-12-21 — End: 2021-12-21
  Filled 2021-12-21: qty 1

## 2021-12-21 MED ORDER — CIPROFLOXACIN IN D5W 400 MG/200ML IV SOLN
400.0000 mg | INTRAVENOUS | Status: AC
  Administered 2021-12-21: 400 mg via INTRAVENOUS
  Filled 2021-12-21: qty 200

## 2021-12-21 MED ORDER — PHENYLEPHRINE 80 MCG/ML (10ML) SYRINGE FOR IV PUSH (FOR BLOOD PRESSURE SUPPORT)
PREFILLED_SYRINGE | INTRAVENOUS | Status: AC
  Filled 2021-12-21: qty 10

## 2021-12-21 MED ORDER — ONDANSETRON HCL 4 MG/2ML IJ SOLN
INTRAMUSCULAR | Status: AC
  Filled 2021-12-21: qty 2

## 2021-12-21 MED ORDER — ESCITALOPRAM OXALATE 20 MG PO TABS
20.0000 mg | ORAL_TABLET | Freq: Every day | ORAL | Status: DC
  Administered 2021-12-22: 20 mg via ORAL
  Filled 2021-12-21: qty 1

## 2021-12-21 MED ORDER — DIPHENHYDRAMINE HCL 25 MG PO CAPS
25.0000 mg | ORAL_CAPSULE | Freq: Once | ORAL | Status: AC
  Administered 2021-12-21: 25 mg via ORAL
  Filled 2021-12-21: qty 1

## 2021-12-21 MED ORDER — ORAL CARE MOUTH RINSE: 15.0000 mL | Freq: Once | OROMUCOSAL | Status: AC

## 2021-12-21 MED ORDER — PANTOPRAZOLE SODIUM 40 MG PO TBEC
40.0000 mg | DELAYED_RELEASE_TABLET | Freq: Every day | ORAL | Status: DC
  Administered 2021-12-22: 40 mg via ORAL
  Filled 2021-12-21: qty 1

## 2021-12-21 MED ORDER — ALLOPURINOL 100 MG PO TABS
100.0000 mg | ORAL_TABLET | Freq: Every day | ORAL | Status: DC
Start: 2021-12-22 — End: 2021-12-22
  Administered 2021-12-22: 100 mg via ORAL
  Filled 2021-12-21: qty 1

## 2021-12-21 MED ORDER — FENTANYL CITRATE (PF) 100 MCG/2ML IJ SOLN
INTRAMUSCULAR | Status: DC | PRN
  Administered 2021-12-21 (×2): 50 ug via INTRAVENOUS

## 2021-12-21 MED ORDER — SUGAMMADEX SODIUM 500 MG/5ML IV SOLN
INTRAVENOUS | Status: AC
  Filled 2021-12-21: qty 5

## 2021-12-21 MED ORDER — METOPROLOL TARTRATE 12.5 MG HALF TABLET
12.5000 mg | ORAL_TABLET | Freq: Two times a day (BID) | ORAL | Status: DC
  Administered 2021-12-21 – 2021-12-22 (×2): 12.5 mg via ORAL
  Filled 2021-12-21 (×2): qty 1

## 2021-12-21 MED ORDER — ROCURONIUM BROMIDE 10 MG/ML (PF) SYRINGE
PREFILLED_SYRINGE | INTRAVENOUS | Status: DC | PRN
  Administered 2021-12-21: 10 mg via INTRAVENOUS
  Administered 2021-12-21: 30 mg via INTRAVENOUS

## 2021-12-21 MED ORDER — PROPOFOL 10 MG/ML IV BOLUS
INTRAVENOUS | Status: AC
Start: 2021-12-21 — End: ?
  Filled 2021-12-21: qty 20

## 2021-12-21 MED ORDER — ORAL CARE MOUTH RINSE: 15.0000 mL | Freq: Once | OROMUCOSAL | Status: DC

## 2021-12-21 MED ORDER — LIDOCAINE HCL 2 % IJ SOLN
INTRAMUSCULAR | Status: AC
  Filled 2021-12-21: qty 20

## 2021-12-21 MED ORDER — SUCCINYLCHOLINE CHLORIDE 200 MG/10ML IV SOSY
PREFILLED_SYRINGE | INTRAVENOUS | Status: AC
  Filled 2021-12-21: qty 10

## 2021-12-21 MED ORDER — DEXAMETHASONE SODIUM PHOSPHATE 4 MG/ML IJ SOLN
INTRAMUSCULAR | Status: DC | PRN
  Administered 2021-12-21: 8 mg via INTRAVENOUS

## 2021-12-21 MED ORDER — ACETAMINOPHEN 650 MG RE SUPP: 650.0000 mg | Freq: Four times a day (QID) | RECTAL | Status: DC | PRN

## 2021-12-21 MED ORDER — HYDROMORPHONE HCL 1 MG/ML IJ SOLN: 0.2500 mg | INTRAMUSCULAR | Status: DC | PRN

## 2021-12-21 MED ORDER — HEMOSTATIC AGENTS (NO CHARGE) OPTIME
TOPICAL | Status: DC | PRN
  Administered 2021-12-21: 1 via TOPICAL

## 2021-12-21 MED ORDER — PROPOFOL 10 MG/ML IV BOLUS
INTRAVENOUS | Status: DC | PRN
  Administered 2021-12-21: 120 mg via INTRAVENOUS
  Administered 2021-12-21: 30 mg via INTRAVENOUS

## 2021-12-21 MED ORDER — DEXAMETHASONE SODIUM PHOSPHATE 10 MG/ML IJ SOLN
INTRAMUSCULAR | Status: AC
  Filled 2021-12-21: qty 1

## 2021-12-21 MED ORDER — EPHEDRINE SULFATE (PRESSORS) 50 MG/ML IJ SOLN
INTRAMUSCULAR | Status: DC | PRN
  Administered 2021-12-21: 5 mg via INTRAVENOUS

## 2021-12-21 MED ORDER — OXYCODONE HCL 5 MG/5ML PO SOLN: 5.0000 mg | Freq: Once | ORAL | Status: DC | PRN

## 2021-12-21 MED ORDER — LEVOTHYROXINE SODIUM 50 MCG PO TABS
50.0000 ug | ORAL_TABLET | Freq: Every day | ORAL | Status: DC
  Administered 2021-12-22: 50 ug via ORAL
  Filled 2021-12-21: qty 1

## 2021-12-21 MED ORDER — ONDANSETRON HCL 4 MG/2ML IJ SOLN: 4.0000 mg | Freq: Once | INTRAMUSCULAR | Status: DC | PRN

## 2021-12-21 MED ORDER — FENTANYL CITRATE (PF) 100 MCG/2ML IJ SOLN
INTRAMUSCULAR | Status: AC
  Filled 2021-12-21: qty 2

## 2021-12-21 MED ORDER — ONDANSETRON 4 MG PO TBDP: 4.0000 mg | ORAL_TABLET | Freq: Four times a day (QID) | ORAL | Status: DC | PRN

## 2021-12-21 MED ORDER — CHLORHEXIDINE GLUCONATE CLOTH 2 % EX PADS: 6.0000 | MEDICATED_PAD | Freq: Once | CUTANEOUS | Status: DC

## 2021-12-21 MED ORDER — OXYCODONE HCL 5 MG PO TABS
5.0000 mg | ORAL_TABLET | ORAL | Status: DC | PRN
  Administered 2021-12-21: 10 mg via ORAL
  Filled 2021-12-21: qty 2

## 2021-12-21 MED ORDER — SODIUM CHLORIDE 0.45 % IV SOLN: INTRAVENOUS | Status: DC

## 2021-12-21 MED ORDER — 0.9 % SODIUM CHLORIDE (POUR BTL) OPTIME
TOPICAL | Status: DC | PRN
  Administered 2021-12-21: 1000 mL

## 2021-12-21 MED ORDER — CHLORHEXIDINE GLUCONATE 0.12 % MT SOLN: 15.0000 mL | Freq: Once | OROMUCOSAL | Status: DC

## 2021-12-21 MED ORDER — HYDROCHLOROTHIAZIDE 12.5 MG PO TABS
12.5000 mg | ORAL_TABLET | Freq: Every day | ORAL | Status: DC
  Administered 2021-12-21 – 2021-12-22 (×2): 12.5 mg via ORAL
  Filled 2021-12-21 (×2): qty 1

## 2021-12-21 MED ORDER — CHLORHEXIDINE GLUCONATE 0.12 % MT SOLN
15.0000 mL | Freq: Once | OROMUCOSAL | Status: AC
  Administered 2021-12-21: 15 mL via OROMUCOSAL

## 2021-12-21 MED ORDER — LIDOCAINE HCL (PF) 2 % IJ SOLN
INTRAMUSCULAR | Status: AC
  Filled 2021-12-21: qty 5

## 2021-12-21 MED ORDER — DOCUSATE SODIUM 100 MG PO CAPS
100.0000 mg | ORAL_CAPSULE | Freq: Every day | ORAL | Status: DC
  Administered 2021-12-21 – 2021-12-22 (×2): 100 mg via ORAL
  Filled 2021-12-21 (×2): qty 1

## 2021-12-21 MED ORDER — LORAZEPAM 0.5 MG PO TABS
0.5000 mg | ORAL_TABLET | Freq: Every day | ORAL | Status: DC
  Administered 2021-12-21: 0.5 mg via ORAL
  Filled 2021-12-21: qty 1

## 2021-12-21 MED ORDER — ROCURONIUM BROMIDE 10 MG/ML (PF) SYRINGE
PREFILLED_SYRINGE | INTRAVENOUS | Status: AC
  Filled 2021-12-21: qty 10

## 2021-12-21 MED ORDER — OLANZAPINE 2.5 MG PO TABS
2.5000 mg | ORAL_TABLET | Freq: Every day | ORAL | Status: DC
  Administered 2021-12-21: 2.5 mg via ORAL
  Filled 2021-12-21: qty 1

## 2021-12-21 MED ORDER — MIDAZOLAM HCL 5 MG/5ML IJ SOLN
INTRAMUSCULAR | Status: DC | PRN
  Administered 2021-12-21: 1 mg via INTRAVENOUS

## 2021-12-21 MED ORDER — FUROSEMIDE 20 MG PO TABS
20.0000 mg | ORAL_TABLET | Freq: Every day | ORAL | Status: DC
  Administered 2021-12-21 – 2021-12-22 (×2): 20 mg via ORAL
  Filled 2021-12-21 (×2): qty 1

## 2021-12-21 MED ORDER — GUAIFENESIN ER 600 MG PO TB12
600.0000 mg | ORAL_TABLET | Freq: Two times a day (BID) | ORAL | Status: DC
  Administered 2021-12-21 – 2021-12-22 (×2): 600 mg via ORAL
  Filled 2021-12-21 (×2): qty 1

## 2021-12-21 MED ORDER — OXYCODONE HCL 5 MG PO TABS: 5.0000 mg | ORAL_TABLET | Freq: Once | ORAL | Status: DC | PRN

## 2021-12-21 MED ORDER — LIDOCAINE 2% (20 MG/ML) 5 ML SYRINGE
INTRAMUSCULAR | Status: DC | PRN
  Administered 2021-12-21: 100 mg via INTRAVENOUS

## 2021-12-21 SURGICAL SUPPLY — 33 items
ATTRACTOMAT 16X20 MAGNETIC DRP (DRAPES) ×2 IMPLANT
BAG COUNTER SPONGE SURGICOUNT (BAG) ×2 IMPLANT
BLADE SURG 15 STRL LF DISP TIS (BLADE) ×1 IMPLANT
BLADE SURG 15 STRL SS (BLADE) ×2
CHLORAPREP W/TINT 26 (MISCELLANEOUS) ×2 IMPLANT
CLIP TI MEDIUM 6 (CLIP) ×4 IMPLANT
CLIP TI WIDE RED SMALL 6 (CLIP) ×6 IMPLANT
COVER SURGICAL LIGHT HANDLE (MISCELLANEOUS) ×2 IMPLANT
DERMABOND ADVANCED (GAUZE/BANDAGES/DRESSINGS) ×1
DERMABOND ADVANCED .7 DNX12 (GAUZE/BANDAGES/DRESSINGS) ×1 IMPLANT
DRAPE LAPAROTOMY T 98X78 PEDS (DRAPES) ×2 IMPLANT
DRAPE UTILITY XL STRL (DRAPES) ×2 IMPLANT
ELECT PENCIL ROCKER SW 15FT (MISCELLANEOUS) ×2 IMPLANT
ELECT REM PT RETURN 15FT ADLT (MISCELLANEOUS) ×2 IMPLANT
GAUZE 4X4 16PLY ~~LOC~~+RFID DBL (SPONGE) ×2 IMPLANT
GLOVE SURG ORTHO 8.0 STRL STRW (GLOVE) ×2 IMPLANT
GLOVE SURG SYN 7.5  E (GLOVE) ×6
GLOVE SURG SYN 7.5 E (GLOVE) ×3 IMPLANT
GLOVE SURG SYN 7.5 PF PI (GLOVE) ×3 IMPLANT
GOWN STRL REUS W/ TWL XL LVL3 (GOWN DISPOSABLE) ×2 IMPLANT
GOWN STRL REUS W/TWL XL LVL3 (GOWN DISPOSABLE) ×4
HEMOSTAT SURGICEL 2X4 FIBR (HEMOSTASIS) ×2 IMPLANT
ILLUMINATOR WAVEGUIDE N/F (MISCELLANEOUS) ×2 IMPLANT
KIT BASIN OR (CUSTOM PROCEDURE TRAY) ×2 IMPLANT
KIT TURNOVER KIT A (KITS) IMPLANT
PACK BASIC VI WITH GOWN DISP (CUSTOM PROCEDURE TRAY) ×2 IMPLANT
SHEARS HARMONIC 9CM CVD (BLADE) ×2 IMPLANT
SUT MNCRL AB 4-0 PS2 18 (SUTURE) ×2 IMPLANT
SUT VIC AB 3-0 SH 18 (SUTURE) ×4 IMPLANT
SYR BULB IRRIG 60ML STRL (SYRINGE) ×2 IMPLANT
TOWEL OR 17X26 10 PK STRL BLUE (TOWEL DISPOSABLE) ×2 IMPLANT
TOWEL OR NON WOVEN STRL DISP B (DISPOSABLE) ×2 IMPLANT
TUBING CONNECTING 10 (TUBING) ×2 IMPLANT

## 2021-12-21 NOTE — Anesthesia Preprocedure Evaluation (Signed)
Anesthesia Evaluation  Patient identified by MRN, date of birth, ID band Patient awake    Reviewed: Allergy & Precautions, NPO status , Patient's Chart, lab work & pertinent test results  Airway Mallampati: II  TM Distance: <3 FB Neck ROM: Full    Dental no notable dental hx.    Pulmonary neg pulmonary ROS,    Pulmonary exam normal breath sounds clear to auscultation       Cardiovascular hypertension, Pt. on medications Normal cardiovascular exam Rhythm:Regular Rate:Normal     Neuro/Psych Anxiety parkinsons dz   Neuromuscular disease    GI/Hepatic Neg liver ROS, GERD  ,  Endo/Other  Hypothyroidism   Renal/GU negative Renal ROS  negative genitourinary   Musculoskeletal negative musculoskeletal ROS (+)   Abdominal   Peds negative pediatric ROS (+)  Hematology negative hematology ROS (+)   Anesthesia Other Findings   Reproductive/Obstetrics negative OB ROS                             Anesthesia Physical Anesthesia Plan  ASA: 3  Anesthesia Plan: General   Post-op Pain Management: Minimal or no pain anticipated   Induction: Intravenous and Rapid sequence  PONV Risk Score and Plan: 3 and Ondansetron, Dexamethasone, Droperidol and Treatment may vary due to age or medical condition  Airway Management Planned: Oral ETT  Additional Equipment:   Intra-op Plan:   Post-operative Plan: Extubation in OR  Informed Consent: I have reviewed the patients History and Physical, chart, labs and discussed the procedure including the risks, benefits and alternatives for the proposed anesthesia with the patient or authorized representative who has indicated his/her understanding and acceptance.     Dental advisory given  Plan Discussed with: CRNA and Surgeon  Anesthesia Plan Comments: (Bite of applesauce with meds this am)        Anesthesia Quick Evaluation

## 2021-12-21 NOTE — Interval H&P Note (Signed)
History and Physical Interval Note:  12/21/2021 8:38 AM  Renee Davidson  has presented today for surgery, with the diagnosis of thyroid neoplasm.  The various methods of treatment have been discussed with the patient and family. After consideration of risks, benefits and other options for treatment, the patient has consented to    Procedure(s): RIGHT THYROID LOBECTOMY (Right) as a surgical intervention.    The patient's history has been reviewed, patient examined, no change in status, stable for surgery.  I have reviewed the patient's chart and labs.  Questions were answered to the patient's satisfaction.    Armandina Gemma, [facility] [place] practice Office: [place]

## 2021-12-21 NOTE — Op Note (Signed)
Procedure Note  Pre-operative Diagnosis:  right thyroid neoplasm of uncertain behavior  Post-operative Diagnosis:  same  Surgeon:  Armandina Gemma, MD  Assistant:  none   Procedure:  Right thyroid lobectomy and isthmusectomy  Anesthesia:  General  Estimated Blood Loss:  minimal  Drains: none         Specimen: thyroid lobe to pathology  Indications:  Patient is referred by Dr. 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 Patient was originally evaluated in March 2022. Due to a variety of medical and social issues, the patient never came to surgery for right thyroid lobectomy for definitive diagnosis and management. Patient has had multiple studies including ultrasounds and CT scans. Previous biopsy showed atypia of undetermined significance, Bethesda category III. Specimen was submitted for molecular genetic testing, AFIRMA, and returned with a result of suspicious, rendering a risk of malignancy of approximately 50%. Patient is on levothyroxine, 50 mcg daily. Patient has had no prior head or neck surgery. Patient has had recent surgery by orthopedic surgery for repair of a right shoulder humerus fracture. Patient is currently on anticoagulation due to deep venous thrombosis. She is scheduled to see her hematologist later this month. She is currently taking Xarelto. Patient is also followed by cardiology, neurology, and pulmonary medicine. Patient is accompanied by her sister. They present today to discuss proceeding with right thyroid lobectomy for management of a thyroid neoplasm of uncertain behavior.   Procedure Details: Procedure was done in OR #1 at the [facility]. The patient was brought to the operating room and placed in a supine position on the operating room table. Following administration of general anesthesia, the patient was positioned and then prepped and draped in the usual aseptic fashion.  After ascertaining that an adequate level of anesthesia had been achieved, a small Kocher incision was made with #15 blade. Dissection was carried through subcutaneous tissues and platysma. Hemostasis was achieved with the electrocautery. Skin flaps were elevated cephalad and caudad from the thyroid notch to the sternal notch. A self-retaining retractor was placed for exposure. Strap muscles were incised in the midline and dissection was begun on the right side. Strap muscles were reflected laterally. The right thyroid lobe was mildly enlarged with a dominant central nodule measuring about 3 cm in dimension. The lobe was gently mobilized with blunt dissection. Superior pole vessels were dissected out and divided individually between small and medium ligaclips with the harmonic scalpel. The thyroid lobe was rolled anteriorly. Branches of the inferior thyroid artery were divided between small ligaclips with the harmonic scalpel. Inferior venous tributaries were divided between ligaclips. Both the superior and inferior parathyroid glands were identified and preserved on their vascular pedicles. The recurrent laryngeal nerve was identified and preserved along its course. The ligament of Gwenlyn Found was released with the electrocautery and the gland was mobilized onto the anterior trachea. Isthmus was mobilized across the midline. There was no significant pyramidal lobe present. The thyroid parenchyma was transected at the junction of the isthmus and contralateral thyroid lobe with the harmonic scalpel. The thyroid lobe and isthmus were submitted to pathology for review.  The neck was irrigated with warm saline. Fibrillar was placed throughout the operative field. Strap muscles were approximated in the midline with interrupted 3-0 Vicryl sutures. Platysma was closed with interrupted 3-0 Vicryl sutures. Skin was closed with a running 4-0 Monocryl subcuticular suture.  Wound was washed and dried and Dermabond was applied. The  patient was awakened from anesthesia and brought to the recovery room. The patient tolerated the procedure well.   Armandina Gemma, MD [facility] Office: [phone]

## 2021-12-21 NOTE — Transfer of Care (Signed)
Immediate Anesthesia Transfer of Care Note  Patient: Renee Davidson  Procedure(s) Performed: RIGHT THYROID LOBECTOMY (Right)  Patient Location: PACU  Anesthesia Type:General  Level of Consciousness: awake, alert , oriented and patient cooperative  Airway & Oxygen Therapy: Patient Spontanous Breathing and Patient connected to face mask oxygen  Post-op Assessment: Report given to RN, Post -op Vital signs reviewed and stable and Patient moving all extremities  Post vital signs: Reviewed and stable  Last Vitals:  Vitals Value Taken Time  BP 127/94 12/21/21 1056  Temp    Pulse 81 12/21/21 1058  Resp 15 12/21/21 1057  SpO2 98 % 12/21/21 1058  Vitals shown include unvalidated device data.  Last Pain:  Vitals:   12/21/21 0802  TempSrc:   PainSc: 0-No pain         Complications: No notable events documented.

## 2021-12-21 NOTE — Anesthesia Procedure Notes (Addendum)
Procedure Name: Intubation Date/Time: 12/21/2021 9:25 AM Performed by: Deliah Boston, CRNA Pre-anesthesia Checklist: Patient identified, Emergency Drugs available, Suction available and Patient being monitored Patient Re-evaluated:Patient Re-evaluated prior to induction Oxygen Delivery Method: Circle system utilized Preoxygenation: Pre-oxygenation with 100% oxygen Induction Type: IV induction and Rapid sequence Laryngoscope Size: Mac and 3 Grade View: Grade II Tube type: Oral Tube size: 7.0 mm Number of attempts: 1 Airway Equipment and Method: Stylet and Oral airway Placement Confirmation: ETT inserted through vocal cords under direct vision, positive ETCO2 and breath sounds checked- equal and bilateral Secured at: 22 cm Tube secured with: Tape Dental Injury: Teeth and Oropharynx as per pre-operative assessment

## 2021-12-22 ENCOUNTER — Encounter (HOSPITAL_COMMUNITY): Payer: Self-pay | Admitting: Surgery

## 2021-12-22 DIAGNOSIS — D440 Neoplasm of uncertain behavior of thyroid gland: Secondary | ICD-10-CM | POA: Diagnosis not present

## 2021-12-22 LAB — SURGICAL PATHOLOGY

## 2021-12-22 NOTE — Plan of Care (Signed)
  Problem: Education: Goal: Knowledge of the prescribed therapeutic regimen will improve Outcome: Adequate for Discharge   Problem: Clinical Measurements: Goal: Complications related to the disease process, condition or treatment will be avoided or minimized Outcome: Adequate for Discharge   Problem: Respiratory: Goal: Ability to maintain a clear airway will improve Outcome: Adequate for Discharge

## 2021-12-22 NOTE — Discharge Instructions (Signed)
[facility] - Dr. Khristen Cheyney  THYROID & PARATHYROID SURGERY:  POST-OP INSTRUCTIONS  Always review the instruction sheet provided by the hospital nurse at discharge.  A prescription for pain medication may be sent to your pharmacy at the time of discharge.  Take your pain medication as prescribed.  If narcotic pain medicine is not needed, then you may take acetaminophen (Tylenol) or ibuprofen (Advil) as needed for pain or soreness.  Take your normal home medications as prescribed unless otherwise directed.  If you need a refill on your pain medication, please contact the office during regular business hours.  Prescriptions will not be processed by the office after 5:00PM or on weekends.  Start with a light diet upon arrival home, such as soup and crackers or toast.  Be sure to drink plenty of fluids.  Resume your normal diet the day after surgery.  Most patients will experience some swelling and bruising on the chest and neck area.  Ice packs will help for the first 48 hours after arriving home.  Swelling and bruising will take several days to resolve.   It is common to experience some constipation after surgery.  Increasing fluid intake and taking a stool softener (Colace) will usually help to prevent this problem.  A mild laxative (Milk of Magnesia or Miralax) should be taken according to package directions if there has been no bowel movement after 48 hours.  Dermabond glue covers your incision. This seals the wound and you may shower at any time. The Dermabond will remain in place for about a week.  You may gradually remove the glue when it loosens around the edges.  If you need to loosen the Dermabond for removal, apply a layer of Vaseline to the wound for 15 minutes and then remove with a Kleenex. Your sutures are under the skin and will not show - they will dissolve on their own.  You may resume light daily activities beginning the day after discharge (such as self-care,  walking, climbing stairs), gradually increasing activities as tolerated. You may have sexual intercourse when it is comfortable. Refrain from any heavy lifting or straining until approved by your doctor. You may drive when you no longer are taking prescription pain medication, you can comfortably wear a seatbelt, and you can safely maneuver your car and apply the brakes.  You will see your doctor in the office for a follow-up appointment approximately three weeks after your surgery.  Make sure that you call for this appointment within a day or two after you arrive home to insure a convenient appointment time. Please have any requested laboratory tests performed a few days prior to your office visit so that the results will be available at your follow up appointment.  WHEN TO CALL THE [facility] OFFICE: -- Fever greater than 101.5 -- Inability to urinate -- Nausea and/or vomiting - persistent -- Extreme swelling or bruising -- Continued bleeding from incision -- Increased pain, redness, or drainage from the incision -- Difficulty swallowing or breathing -- Muscle cramping or spasms -- Numbness or tingling in hands or around lips  The clinic staff is available to answer your questions during regular business hours.  Please don't hesitate to call and ask to speak to one of the nurses if you have concerns.  [facility] OFFICE: [phone] (24 hours)  Please sign up for MyChart accounts. This will allow you to communicate directly with my nurse or myself without having to call the office. It will also allow you   to view your test results. You will need to enroll in MyChart for my office ([facility]) and for the hospital ([place]).  Toshiro Hanken, MD [facility] practice 

## 2021-12-22 NOTE — Discharge Summary (Signed)
Physician Discharge Summary   Patient ID: Renee Davidson MRN: [id number] DOB/AGE: [birth year] 73 y.o.  Admit date: 12/21/2021  Discharge date: 12/22/2021  Discharge Diagnoses:  Principal Problem:   Neoplasm of uncertain behavior of thyroid gland Active Problems:   Thyroid nodule, uninodular   Discharged Condition: good  Hospital Course: Patient was admitted for observation following thyroid surgery.  Post op course was uncomplicated.  Pain was well controlled.  Tolerated diet.  Patient was prepared for discharge home on POD#1.  Consults: None  Treatments: surgery: right thyroid lobectomy and isthmusectomy  Discharge Exam: Blood pressure (!) 122/57, pulse 70, temperature (!) 97.3 F (36.3 C), temperature source Oral, resp. rate 16, height 5' 5.5" (1.664 m), weight 119.4 kg, SpO2 97 %. HEENT - clear Neck - wound dry and intact; mild STS; mild ecchymosis; voice normal  Disposition: Home  Discharge Instructions     Diet - low sodium heart healthy   Complete by: As directed    Increase activity slowly   Complete by: As directed    No dressing needed   Complete by: As directed       Allergies as of 12/22/2021       Reactions   Keflex [cephalexin] Other (See Comments)   Dizzy, lightheaded, falls   Doxycycline Rash        Medication List     TAKE these medications    Acetaminophen 500 MG capsule Take 1,000 mg by mouth in the morning, at noon, and at bedtime.   Albuterol Sulfate 108 (90 Base) MCG/ACT Aepb Commonly known as: PROAIR RESPICLICK Inhale 2 puffs into the lungs 2 (two) times daily as needed (shortness of breath or wheezing).   allopurinol 100 MG tablet Commonly known as: ZYLOPRIM Take 100 mg by mouth daily.   vitamin C 250 MG tablet Commonly known as: ASCORBIC ACID Take 250 mg by mouth daily.   ascorbic acid 500 MG tablet Commonly known as: VITAMIN C Take 1 tablet (500 mg total) by mouth daily.   aspirin EC 81 MG tablet Take 81 mg by mouth  daily. Swallow whole.   calcium carbonate 500 MG chewable tablet Commonly known as: TUMS - dosed in mg elemental calcium Chew 2 tablets by mouth every 6 (six) hours as needed for indigestion or heartburn.   Cholecalciferol 50 MCG (2000 UT) Caps Take 2,000 Units by mouth daily.   dextromethorphan 30 MG/5ML liquid Commonly known as: DELSYM Take 60 mg by mouth every 12 (twelve) hours as needed for cough.   Dextromethorphan-guaiFENesin 10-100 MG/5ML liquid Take 10 mLs by mouth daily as needed (cough).   diclofenac Sodium 1 % Gel Commonly known as: VOLTAREN Apply 1 application. topically in the morning, at noon, and at bedtime.   docusate sodium 100 MG capsule Commonly known as: COLACE Take 100 mg by mouth daily.   escitalopram 20 MG tablet Commonly known as: LEXAPRO Take 1 tablet (20 mg total) by mouth daily.   ferrous sulfate 325 (65 FE) MG tablet Take 1 tablet (325 mg total) by mouth daily with breakfast.   furosemide 20 MG tablet Commonly known as: LASIX Take 20 mg by mouth daily.   hydrochlorothiazide 12.5 MG capsule Commonly known as: MICROZIDE Take 12.5 mg by mouth daily.   levothyroxine 50 MCG tablet Commonly known as: SYNTHROID Take 1 tablet (50 mcg total) by mouth daily before breakfast.   LORazepam 0.5 MG tablet Commonly known as: ATIVAN Take 0.5 mg by mouth at bedtime.   lovastatin 40  MG tablet Commonly known as: MEVACOR Take 1 tablet (40 mg total) by mouth at bedtime.   methocarbamol 500 MG tablet Commonly known as: ROBAXIN Take 500 mg by mouth every 8 (eight) hours as needed for muscle spasms.   metoprolol tartrate 25 MG tablet Commonly known as: LOPRESSOR Take 12.5 mg by mouth 2 (two) times daily.   mineral oil-hydrophilic petrolatum ointment Apply 1 application. topically in the morning, at noon, and at bedtime. Apply to buttocks and crease of buttocks for redness and irritation   Mucinex 600 MG 12 hr tablet Generic drug: guaiFENesin Take 600  mg by mouth 2 (two) times daily.   nystatin powder Commonly known as: MYCOSTATIN/NYSTOP Apply 1 application. topically 3 (three) times daily. Under breasts and groin area   OLANZapine 2.5 MG tablet Commonly known as: ZYPREXA Take 2.5 mg by mouth daily with supper.   pantoprazole 40 MG tablet Commonly known as: PROTONIX TAKE 1 TABLET DAILY   polyethylene glycol 17 g packet Commonly known as: MIRALAX / GLYCOLAX Take 17 g by mouth daily as needed for moderate constipation.   polyvinyl alcohol 1.4 % ophthalmic solution Commonly known as: LIQUIFILM TEARS Place 1 drop into both eyes as needed for dry eyes.   potassium chloride 20 MEQ/15ML (10%) Soln Take 20 mEq by mouth daily.   rivaroxaban 20 MG Tabs tablet Commonly known as: XARELTO Take 20 mg by mouth daily.   traMADol 50 MG tablet Commonly known as: ULTRAM Take 50 mg by mouth every 6 (six) hours as needed for moderate pain.               Discharge Care Instructions  (From admission, onward)           Start     Ordered   12/22/21 0000  No dressing needed        12/22/21 9563            Follow-up Information     Armandina Gemma, MD. Schedule an appointment as soon as possible for a visit in 3 week(s).   Specialty: General Surgery Why: For wound re-check Contact information: [place] [address] [phone]                 Javan Gonzaga, [place] [facility] Office: [phone]   Signed: Armandina Gemma 12/22/2021, 9:36 AM

## 2021-12-22 NOTE — Anesthesia Postprocedure Evaluation (Signed)
Anesthesia Post Note  Patient: Renee Davidson  Procedure(Davidson) Performed: RIGHT THYROID LOBECTOMY (Right)     Patient location during evaluation: PACU Anesthesia Type: General Level of consciousness: awake and alert Pain management: pain level controlled Vital Signs Assessment: post-procedure vital signs reviewed and stable Respiratory status: spontaneous breathing, nonlabored ventilation, respiratory function stable and patient connected to nasal cannula oxygen Cardiovascular status: blood pressure returned to baseline and stable Postop Assessment: no apparent nausea or vomiting Anesthetic complications: no   No notable events documented.  Last Vitals:  Vitals:   12/22/21 0131 12/22/21 0512  BP: 136/75 (!) 120/54  Pulse: 85 70  Resp: 20 12  Temp: (!) 36.4 C 36.5 C  SpO2: 95% 100%    Last Pain:  Vitals:   12/22/21 0512  TempSrc: Oral  PainSc:                  Renee Davidson

## 2021-12-22 NOTE — Progress Notes (Signed)
  Transition of Care [facility]) Screening Note   Patient Details  Name: Renee Davidson Date of Birth: 09/14/1950   Transition of Care [facility]) CM/SW Contact:    Lennart Pall, LCSW Phone Number: 12/22/2021, 9:57 AM    Transition of Care Department [facility]) has reviewed patient and no TOC needs have been identified at this time. We will continue to monitor patient advancement through interdisciplinary progression rounds. If new patient transition needs arise, please place a TOC consult.

## 2021-12-29 ENCOUNTER — Telehealth: Payer: Self-pay | Admitting: "Endocrinology

## 2021-12-29 NOTE — Telephone Encounter (Signed)
Patient had her Thyroid Surgery on 5/22, her last office visit note does not specify when you wanted her back. I know that she will need labs. When do you want her back?   Call back to [place] at [phone]

## 2022-01-07 NOTE — Progress Notes (Signed)
Good news on your pathology reports.  Non-invasive NIFTP tumors just need to be completely excised, and yours have been with negative margins.  No further treatment should be necessary.  Will review with you in the office.  [place], MD [facility] [place] [facility] Office: [phone]

## 2022-01-19 ENCOUNTER — Encounter: Payer: Self-pay | Admitting: "Endocrinology

## 2022-01-19 ENCOUNTER — Ambulatory Visit (INDEPENDENT_AMBULATORY_CARE_PROVIDER_SITE_OTHER): Payer: Medicare Other | Admitting: "Endocrinology

## 2022-01-19 VITALS — BP 112/64 | HR 60 | Ht 65.5 in | Wt 267.0 lb

## 2022-01-19 DIAGNOSIS — E890 Postprocedural hypothyroidism: Secondary | ICD-10-CM | POA: Diagnosis not present

## 2022-01-19 DIAGNOSIS — C73 Malignant neoplasm of thyroid gland: Secondary | ICD-10-CM | POA: Diagnosis not present

## 2022-01-19 DIAGNOSIS — E039 Hypothyroidism, unspecified: Secondary | ICD-10-CM | POA: Diagnosis not present

## 2022-01-19 MED ORDER — LEVOTHYROXINE SODIUM 75 MCG PO TABS: 75.0000 ug | ORAL_TABLET | Freq: Every day | ORAL | 1 refills | Status: DC

## 2022-01-19 NOTE — Progress Notes (Signed)
01/19/2022, 6:41 PM  Endocrinology follow-up note   Subjective:    Patient ID: Renee Davidson, female    DOB: 1950/09/16, PCP Jodi Marble, MD   Past Medical History:  Diagnosis Date   Anxiety    Cancer [facility])    malignant thyroid   CHF (congestive heart failure) ([place])    hx of 09/2020 per note   Conversion disorder with seizures or convulsions    Depression    Diverticulosis    DVT (deep venous thrombosis) (HCC)    right   Dyspnea    Dysrhythmia    hx of SVT- 01/14/21 per note   Essential hypertension    Fracture of right humerus    GERD (gastroesophageal reflux disease)    Hiatal hernia    Humerus fracture    right   Hx of falling    Hyperlipidemia    Hypothyroidism    Mental developmental delay    OCD (obsessive compulsive disorder)    Osteoporosis    Parkinson disease ([place])    Parkinson's disease ([place])    Peptic ulcer    Pneumonia    hx of pneumonal several times per sister at preop   Thyroid disease    Thyroid nodule    Past Surgical History:  Procedure Laterality Date   BIOPSY  11/02/2019   Procedure: BIOPSY;  Surgeon: Gatha Mayer, MD;  Location: [facility];  Service: Endoscopy;;   COLONOSCOPY WITH PROPOFOL N/A 11/02/2019   Procedure: COLONOSCOPY WITH PROPOFOL;  Surgeon: Gatha Mayer, MD;  Location: [facility] ENDOSCOPY;  Service: Endoscopy;  Laterality: N/A;   ESOPHAGOGASTRODUODENOSCOPY (EGD) WITH PROPOFOL N/A 11/01/2019   Procedure: ESOPHAGOGASTRODUODENOSCOPY (EGD) WITH PROPOFOL;  Surgeon: Gatha Mayer, MD;  Location: [facility];  Service: Endoscopy;  Laterality: N/A;   HUMERUS FRACTURE SURGERY Right 08/13/2020   THYROID LOBECTOMY Right 12/21/2021   Procedure: RIGHT THYROID LOBECTOMY;  Surgeon: Armandina Gemma, MD;  Location: [facility] ORS;  Service: General;  Laterality: Right;   TONSILLECTOMY     Social History   Socioeconomic History   Marital status: Single    Spouse name: Not on file   Number  of children: 0   Years of education: 12   Highest education level: 12th grade  Occupational History   Occupation: retired    Comment: [employer]  Tobacco Use   Smoking status: Never   Smokeless tobacco: Never  Vaping Use   Vaping Use: Never used  Substance and Sexual Activity   Alcohol use: No   Drug use: No   Sexual activity: Never  Other Topics Concern   Not on file  Social History Narrative   Not on file   Social Determinants of Health   Financial Resource Strain: Low Risk  (07/04/2017)   Overall Financial Resource Strain (CARDIA)    Difficulty of Paying Living Expenses: Not hard at all  Food Insecurity: No Food Insecurity (07/04/2017)   Hunger Vital Sign    Worried About Running Out of Food in the Last Year: Never true    Ran Out of Food in the Last Year: Never true  Transportation Needs: No Transportation Needs (07/04/2017)   PRAPARE - Hydrologist (Medical): No  Lack of Transportation (Non-Medical): No  Physical Activity: Insufficiently Active (01/23/2018)   Exercise Vital Sign    Days of Exercise per Week: 3 days    Minutes of Exercise per Session: 30 min  Stress: No Stress Concern Present (07/04/2017)   [place]    Feeling of Stress : Not at all  Social Connections: Moderately Integrated (07/04/2017)   Social Connection and Isolation Panel [NHANES]    Frequency of Communication with Friends and Family: More than three times a week    Frequency of Social Gatherings with Friends and Family: More than three times a week    Attends Religious Services: More than 4 times per year    Active Member of [employer] or Organizations: Yes    Attends Music therapist: More than 4 times per year    Marital Status: Never married   Family History  Problem Relation Age of Onset   GI problems Mother    Hypertension Mother    Hyperlipidemia Mother    Heart disease Mother     Diabetes Father    Hyperlipidemia Father    Hypertension Father    Atrial fibrillation Father    Heart attack Father    Osteoporosis Father    Colon polyps Father    Hyperlipidemia Sister    Dysphagia Sister    Heart disease Maternal Grandfather    Colon cancer Maternal Grandfather    Diabetes Paternal Grandmother    Heart disease Paternal Grandmother    Outpatient Encounter Medications as of 01/19/2022  Medication Sig   [DISCONTINUED] ascorbic acid (VITAMIN C) 500 MG tablet Take 1 tablet (500 mg total) by mouth daily.   Acetaminophen 500 MG capsule Take 1,000 mg by mouth in the morning, at noon, and at bedtime.   Albuterol Sulfate (PROAIR RESPICLICK) 549 (90 Base) MCG/ACT AEPB Inhale 2 puffs into the lungs 2 (two) times daily as needed (shortness of breath or wheezing).   allopurinol (ZYLOPRIM) 100 MG tablet Take 100 mg by mouth daily.   aspirin 81 MG EC tablet Take 81 mg by mouth daily. Swallow whole.   calcium carbonate (TUMS - DOSED IN MG ELEMENTAL CALCIUM) 500 MG chewable tablet Chew 2 tablets by mouth every 6 (six) hours as needed for indigestion or heartburn. (Patient not taking: Reported on 01/19/2022)   Cholecalciferol 50 MCG (2000 UT) CAPS Take 2,000 Units by mouth daily. (Patient not taking: Reported on 01/19/2022)   dextromethorphan (DELSYM) 30 MG/5ML liquid Take 60 mg by mouth every 12 (twelve) hours as needed for cough.   Dextromethorphan-guaiFENesin 10-100 MG/5ML liquid Take 10 mLs by mouth daily as needed (cough).   diclofenac Sodium (VOLTAREN) 1 % GEL Apply 1 application. topically in the morning, at noon, and at bedtime. (Patient not taking: Reported on 01/19/2022)   docusate sodium (COLACE) 100 MG capsule Take 100 mg by mouth daily.   escitalopram (LEXAPRO) 20 MG tablet Take 1 tablet (20 mg total) by mouth daily.   ferrous sulfate 325 (65 FE) MG tablet Take 1 tablet (325 mg total) by mouth daily with breakfast.   furosemide (LASIX) 20 MG tablet Take 20 mg by mouth daily.    hydrochlorothiazide (MICROZIDE) 12.5 MG capsule Take 12.5 mg by mouth daily.   levothyroxine (SYNTHROID) 75 MCG tablet Take 1 tablet (75 mcg total) by mouth daily before breakfast.   LORazepam (ATIVAN) 0.5 MG tablet Take 0.5 mg by mouth at bedtime.   lovastatin (MEVACOR) 40 MG tablet  Take 1 tablet (40 mg total) by mouth at bedtime.   methocarbamol (ROBAXIN) 500 MG tablet Take 500 mg by mouth every 8 (eight) hours as needed for muscle spasms.   metoprolol tartrate (LOPRESSOR) 25 MG tablet Take 12.5 mg by mouth 2 (two) times daily.   mineral oil-hydrophilic petrolatum (AQUAPHOR) ointment Apply 1 application. topically in the morning, at noon, and at bedtime. Apply to buttocks and crease of buttocks for redness and irritation   MUCINEX 600 MG 12 hr tablet Take 600 mg by mouth 2 (two) times daily.   nystatin (MYCOSTATIN/NYSTOP) powder Apply 1 application. topically 3 (three) times daily. Under breasts and groin area   OLANZapine (ZYPREXA) 2.5 MG tablet Take 2.5 mg by mouth daily with supper.   pantoprazole (PROTONIX) 40 MG tablet TAKE 1 TABLET DAILY   polyethylene glycol (MIRALAX / GLYCOLAX) 17 g packet Take 17 g by mouth daily as needed for moderate constipation.   polyvinyl alcohol (LIQUIFILM TEARS) 1.4 % ophthalmic solution Place 1 drop into both eyes as needed for dry eyes. (Patient not taking: Reported on 01/19/2022)   potassium chloride 20 MEQ/15ML (10%) SOLN Take 20 mEq by mouth daily.   rivaroxaban (XARELTO) 20 MG TABS tablet Take 20 mg by mouth daily.   traMADol (ULTRAM) 50 MG tablet Take 50 mg by mouth every 6 (six) hours as needed for moderate pain.   vitamin C (ASCORBIC ACID) 250 MG tablet Take 250 mg by mouth daily.   [DISCONTINUED] levothyroxine (SYNTHROID) 50 MCG tablet Take 1 tablet (50 mcg total) by mouth daily before breakfast.   No facility-administered encounter medications on file as of 01/19/2022.   ALLERGIES: Allergies  Allergen Reactions   Keflex [Cephalexin] Other (See  Comments)    Dizzy, lightheaded, falls   Doxycycline Rash    VACCINATION STATUS: Immunization History  Administered Date(s) Administered   Influenza, High Dose Seasonal PF 05/14/2016, 05/06/2017, 05/09/2018   Influenza,inj,Quad PF,6+ Mos 05/18/2013, 05/07/2014, 05/06/2015   Influenza-Unspecified 05/09/2020   Moderna Sars-Covid-2 Vaccination 09/06/2019, 10/04/2019, 06/18/2020, 11/25/2020   Pneumococcal Conjugate-13 01/17/2014   Pneumococcal Polysaccharide-23 07/01/2015   Tdap 05/31/2011, 05/11/2020   Zoster Recombinat (Shingrix) 07/04/2017, 10/18/2017   Zoster, Live 03/04/2014    HPI Renee Davidson is 71 y.o. female who presents today with a medical history as above. she is being seen in follow-up after her recent partial thyroidectomy, postsurgical hypothyroidism by Dr. Harlow Asa.    She underwent right lobectomy/isthmusectomy of her thyroid on Dec 21, 2021.  Surgical sample showed noninvasive thyroid malignancy, see below.   She remains on Synthroid 50 mcg p.o. daily before breakfast.   She is accompanied by her [name] to the clinic.  She is known to have hypothyroidism for at least 10 years.    In November 2021 she was found to have incidental thyroid nodule of around the right lobe of her thyroid on CT scan of the chest.  Subsequent ultrasound confirmed 3 cm nodule on the right lobe with suspicious features.  She underwent fine-needle aspiration on November 24 which revealed atypia of undetermined significance.  A sample was sent for Afirma, results are consistent with approximately 50% risk of malignancy.    She denies dysphagia, shortness of breath, nor voice change.    She has no family history of thyroid malignancy.  She reports minimally fluctuating body weight.  She uses a walker to get around due to disequilibrium.    Review of Systems  Constitutional: no recent weight gain/loss, no fatigue, no subjective hyperthermia,  no subjective hypothermia   Objective:        01/19/2022    3:08 PM 12/22/2021    9:18 AM 12/22/2021    5:12 AM  Vitals with BMI  Height 5' 5.5"    Weight 267 lbs    BMI 73.22    Systolic 025 427 062  Diastolic 64 57 54  Pulse 60 70 70    BP 112/64   Pulse 60   Ht 5' 5.5" (1.664 m)   Wt 267 lb (121.1 kg)   BMI 43.76 kg/m   Wt Readings from Last 3 Encounters:  01/19/22 267 lb (121.1 kg)  12/21/21 263 lb 3.2 oz (119.4 kg)  10/28/21 256 lb (116.1 kg)    Physical Exam  Constitutional:  Body mass index is 43.76 kg/m.,  not in acute distress, normal state of mind Eyes: PERRLA, EOMI, no exophthalmos ENT: moist mucous membranes, + palpable thyroid,  gross cervical lymphadenopathy   CMP ( most recent) CMP     Component Value Date/Time   NA 140 12/01/2021 1138   NA 139 02/22/2020 1252   K 4.0 12/01/2021 1138   CL 104 12/01/2021 1138   CO2 29 12/01/2021 1138   GLUCOSE 86 12/01/2021 1138   BUN 14 12/01/2021 1138   BUN 11 02/22/2020 1252   CREATININE 0.92 12/01/2021 1138   CALCIUM 8.9 12/01/2021 1138   PROT 7.6 07/10/2020 1942   PROT 6.6 02/22/2020 1252   ALBUMIN 4.0 07/10/2020 1942   ALBUMIN 4.0 02/22/2020 1252   AST 20 07/10/2020 1942   ALT 16 07/10/2020 1942   ALKPHOS 109 07/10/2020 1942   BILITOT 0.6 07/10/2020 1942   BILITOT 0.3 02/22/2020 1252   GFRNONAA >60 12/01/2021 1138   GFRAA >60 04/19/2020 1219     Diabetic Labs (most recent): Lab Results  Component Value Date   HGBA1C 5.7 06/08/2017     Lipid Panel ( most recent) Lipid Panel     Component Value Date/Time   CHOL 163 02/22/2020 1252   TRIG 74 02/22/2020 1252   TRIG 49 12/18/2014 1209   HDL 53 02/22/2020 1252   HDL 55 12/18/2014 1209   CHOLHDL 3.1 02/22/2020 1252   LDLCALC 96 02/22/2020 1252   LDLCALC 89 01/17/2014 0946   LABVLDL 14 02/22/2020 1252      Lab Results  Component Value Date   TSH 6.640 (H) 01/12/2022   TSH 0.893 02/22/2020   TSH 0.761 12/04/2019   TSH 2.407 10/31/2019   TSH 2.130 03/26/2019   TSH 1.360 07/07/2018    TSH 0.282 (L) 05/09/2018   TSH 0.428 (L) 11/04/2017   TSH 0.727 06/06/2017   TSH 1.420 12/06/2016   FREET4 1.07 01/12/2022    Fine-needle aspiration of right lobe thyroid nodule on June 25, 2020 FINAL MICROSCOPIC DIAGNOSIS:  - Atypia of undetermined significance (Bethesda category III)   SPECIMEN ADEQUACY:  Satisfactory for evaluation   DIAGNOSTIC COMMENTS:  This specimen will be sent for Afirma testing.   Molecular pathology results were received, indicating approximately 50% risk of malignancy.  Dec 21, 2021 FINAL MICROSCOPIC DIAGNOSIS:   A. 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  Please see the following synoptic report. Assessment & Plan:   1.  Malignant neoplasm of the thyroid 2. Acquired hypothyroidism   See note above. Patient is status post right hemithyroidectomy on Dec 21, 2021.  This was performed by  Dr. Harlow Asa.  Her final microscopic diagnosis showed noninvasive follicular thyroid neoplasm with papillary-like nuclear features (NIFTP) and clear margins of resection.  Based on the cytology description, she will not need adjuvant treatment.  She would not be considered for radioactive iodine thyroid ablation. She still had left lobe of her thyroid in place.  Her previsit thyroid function tests, she will be considered for higher dose of Synthroid.  I discussed and increase her Synthroid to 75 mcg p.o. daily before breakfast.     - We discussed about the correct intake of her thyroid hormone, on empty stomach at fasting, with water, separated by at least 30 minutes from breakfast and other medications,  and separated by more than 4 hours from calcium, iron, multivitamins, acid reflux medications (PPIs). -Patient is made aware of the fact that thyroid hormone replacement is needed for life, dose to be adjusted by periodic  monitoring of thyroid function tests.    - she is advised to maintain close follow up with Jodi Marble, MD for primary care needs.    I spent 30 minutes in the care of the patient today including review of labs from Thyroid Function, CMP, and other relevant labs ; imaging/biopsy records (current and previous including abstractions from other facilities); face-to-face time discussing  her lab results and symptoms, medications doses, her options of short and long term treatment based on the latest standards of care / guidelines;   and documenting the encounter.  Garvin Fila Mathwig  participated in the discussions, expressed understanding, and voiced agreement with the above plans.  All questions were answered to her satisfaction. she is encouraged to contact clinic should she have any questions or concerns prior to her return visit.    Follow up plan: Return in about 6 months (around 07/21/2022) for F/U with Pre-visit Labs.   Glade Lloyd, MD [facility] [address] [place], [place] [address] Phone: [phone]  Fax: [phone]     01/19/2022, 6:41 PM  This note was partially dictated with voice recognition software. Similar sounding words can be transcribed inadequately or may not  be corrected upon review.

## 2022-01-23 LAB — TSH: TSH: 6.64 u[IU]/mL — ABNORMAL HIGH (ref 0.450–4.500)

## 2022-01-23 LAB — THYROGLOBULIN LEVEL: Thyroglobulin (TG-RIA): 22 ng/mL

## 2022-01-23 LAB — T4, FREE: Free T4: 1.07 ng/dL (ref 0.82–1.77)

## 2022-01-23 LAB — THYROGLOBULIN ANTIBODY: Thyroglobulin Antibody: <1 IU/mL (ref 0.0–0.9)

## 2022-03-24 ENCOUNTER — Ambulatory Visit (INDEPENDENT_AMBULATORY_CARE_PROVIDER_SITE_OTHER): Payer: Medicare Other

## 2022-03-24 ENCOUNTER — Encounter: Payer: Self-pay | Admitting: Primary Care

## 2022-03-24 ENCOUNTER — Ambulatory Visit (INDEPENDENT_AMBULATORY_CARE_PROVIDER_SITE_OTHER): Payer: Medicare Other | Admitting: Primary Care

## 2022-03-24 VITALS — BP 126/82 | HR 60 | Temp 98.3°F | Ht 65.5 in | Wt 264.2 lb

## 2022-03-24 DIAGNOSIS — R053 Chronic cough: Secondary | ICD-10-CM | POA: Insufficient documentation

## 2022-03-24 DIAGNOSIS — J690 Pneumonitis due to inhalation of food and vomit: Secondary | ICD-10-CM

## 2022-03-24 NOTE — Assessment & Plan Note (Signed)
-   Patient was treated for pneumonia in July at her assisted living with Levaquin. She has episodes of aspiration and choking after eating. She will be seeing speech therapy for eval/treat at her facility. Checking CXR today and sputum sample. Continue mucinex '600mg'$  twice daily. Adding flutter valve three times daily. FU in 1 month or sooner if needed.

## 2022-03-24 NOTE — Assessment & Plan Note (Signed)
-   D/t reflux and hiatal hernia  - Continue Protonix '40mg'$  daily

## 2022-03-24 NOTE — Progress Notes (Signed)
Please let patient know CXR showed moderate hiatal hernia. No pneumonia. Bronchial thickening. Continue pulm clearance with mucinex and flutter valve

## 2022-03-24 NOTE — Patient Instructions (Signed)
Cough/ pneumonia felt to be due to aspiration and reflux related to hiatal hernia Speech therapy as ordered Aspiration precautions strongly encouraged Pulmonary clearance with Mucinex and flutter valve recommended Check a sputum sample If any abnormal findings on chest x-ray will likely get CT imaging of her chest  Recommendations: Continue Mucinex 600 mg twice daily Take Delsym 60 mg by mouth every 12 hours as needed for cough Use flutter valve 3 times a day for chest congestion Use incentive spirometer 5-10 times an hour while awake  Orders: Sputum sample Chest x-ray today  Follow-up 1 month with Dr. Ander Slade or [name] NP

## 2022-03-24 NOTE — Progress Notes (Signed)
$'@Patient'S$  ID: Renee Davidson, female    DOB: 1951-07-15, 71 y.o.   MRN: [id number]  Chief Complaint  Patient presents with   Follow-up    Bad cough x5 months with SOB    Referring provider: Jodi Marble, MD  HPI: 71 year old female, never smoked.  Past medical history significant for GERD, hypothyroidism, thyroid nodule, osteoporosis, obesity.  Patient of Dr. Ander Slade, last seen in office on 12/24/2020.  Shortness of breath felt to be multifactorial due to deconditioning and weight.  Cough also felt to be multifactorial related to reflux.  03/24/2022 Patient presents today for acute overview due to cough. Accompanied by her sister. She lives at assisted living facility. She has had a chronic cough, unclear how long. Cough can be productive with green sputum. Cough primarily occurs at night. She has a large hiatal hernia, no surgical options. She will have episodes of choking/ aspiration. Recent pneumonia in July treated with Levaquin. She is currently taking mucinex '600mg'$  twice daily and prn delsym. She has been ordered for speech therapy eval and treat.    Allergies  Allergen Reactions   Keflex [Cephalexin] Other (See Comments)    Dizzy, lightheaded, falls   Doxycycline Rash    Immunization History  Administered Date(s) Administered   Influenza, High Dose Seasonal PF 05/14/2016, 05/06/2017, 05/09/2018   Influenza,inj,Quad PF,6+ Mos 05/18/2013, 05/07/2014, 05/06/2015   Influenza-Unspecified 05/09/2020   Moderna Sars-Covid-2 Vaccination 09/06/2019, 10/04/2019, 06/18/2020, 11/25/2020   Pneumococcal Conjugate-13 01/17/2014   Pneumococcal Polysaccharide-23 07/01/2015   Tdap 05/31/2011, 05/11/2020   Zoster Recombinat (Shingrix) 07/04/2017, 10/18/2017   Zoster, Live 03/04/2014    Past Medical History:  Diagnosis Date   Anxiety    Cancer ([place])    malignant thyroid   CHF (congestive heart failure) ([place])    hx of 09/2020 per note   Conversion disorder with seizures or convulsions     Depression    Diverticulosis    DVT (deep venous thrombosis) ([facility])    right   Dyspnea    Dysrhythmia    hx of SVT- 01/14/21 per note   Essential hypertension    Fracture of right humerus    GERD (gastroesophageal reflux disease)    Hiatal hernia    Humerus fracture    right   Hx of falling    Hyperlipidemia    Hypothyroidism    Mental developmental delay    OCD (obsessive compulsive disorder)    Osteoporosis    Parkinson disease ([place])    Parkinson's disease ([place])    Peptic ulcer    Pneumonia    hx of pneumonal several times per sister at preop   Thyroid disease    Thyroid nodule     Tobacco History: Social History   Tobacco Use  Smoking Status Never  Smokeless Tobacco Never   Counseling given: Not Answered   Outpatient Medications Prior to Visit  Medication Sig Dispense Refill   Acetaminophen 500 MG capsule Take 1,000 mg by mouth in the morning, at noon, and at bedtime.     allopurinol (ZYLOPRIM) 100 MG tablet Take 100 mg by mouth daily.     aspirin 81 MG EC tablet Take 81 mg by mouth daily. Swallow whole.     calcium carbonate (TUMS - DOSED IN MG ELEMENTAL CALCIUM) 500 MG chewable tablet Chew 2 tablets by mouth every 6 (six) hours as needed for indigestion or heartburn.     Cholecalciferol 50 MCG (2000 UT) CAPS Take 2,000 Units by mouth daily.  dextromethorphan (DELSYM) 30 MG/5ML liquid Take 60 mg by mouth every 12 (twelve) hours as needed for cough.     Dextromethorphan-guaiFENesin 10-100 MG/5ML liquid Take 10 mLs by mouth daily as needed (cough).     diclofenac Sodium (VOLTAREN) 1 % GEL Apply 1 application  topically in the morning, at noon, and at bedtime.     docusate sodium (COLACE) 100 MG capsule Take 100 mg by mouth daily.     escitalopram (LEXAPRO) 20 MG tablet Take 1 tablet (20 mg total) by mouth daily. 90 tablet 1   ferrous sulfate 325 (65 FE) MG tablet Take 1 tablet (325 mg total) by mouth daily with breakfast. 90 tablet 1   furosemide (LASIX) 20 MG  tablet Take 20 mg by mouth daily.     hydrochlorothiazide (MICROZIDE) 12.5 MG capsule Take 12.5 mg by mouth daily.     levothyroxine (SYNTHROID) 75 MCG tablet Take 1 tablet (75 mcg total) by mouth daily before breakfast. 90 tablet 1   LORazepam (ATIVAN) 0.5 MG tablet Take 0.5 mg by mouth at bedtime.     lovastatin (MEVACOR) 40 MG tablet Take 1 tablet (40 mg total) by mouth at bedtime. 90 tablet 1   methocarbamol (ROBAXIN) 500 MG tablet Take 500 mg by mouth every 8 (eight) hours as needed for muscle spasms.     metoprolol tartrate (LOPRESSOR) 25 MG tablet Take 12.5 mg by mouth 2 (two) times daily.     mineral oil-hydrophilic petrolatum (AQUAPHOR) ointment Apply 1 application. topically in the morning, at noon, and at bedtime. Apply to buttocks and crease of buttocks for redness and irritation     MUCINEX 600 MG 12 hr tablet Take 600 mg by mouth 2 (two) times daily.     nystatin (MYCOSTATIN/NYSTOP) powder Apply 1 application. topically 3 (three) times daily. Under breasts and groin area     OLANZapine (ZYPREXA) 2.5 MG tablet Take 2.5 mg by mouth daily with supper.     pantoprazole (PROTONIX) 40 MG tablet TAKE 1 TABLET DAILY 90 tablet 0   polyethylene glycol (MIRALAX / GLYCOLAX) 17 g packet Take 17 g by mouth daily as needed for moderate constipation.     polyvinyl alcohol (LIQUIFILM TEARS) 1.4 % ophthalmic solution Place 1 drop into both eyes as needed for dry eyes.     potassium chloride 20 MEQ/15ML (10%) SOLN Take 20 mEq by mouth daily.     rivaroxaban (XARELTO) 20 MG TABS tablet Take 20 mg by mouth daily.     traMADol (ULTRAM) 50 MG tablet Take 50 mg by mouth every 6 (six) hours as needed for moderate pain.     vitamin C (ASCORBIC ACID) 250 MG tablet Take 250 mg by mouth daily.     Albuterol Sulfate (PROAIR RESPICLICK) 400 (90 Base) MCG/ACT AEPB Inhale 2 puffs into the lungs 2 (two) times daily as needed (shortness of breath or wheezing).     No facility-administered medications prior to visit.     Review of Systems  Review of Systems  Constitutional: Negative.   HENT: Negative.    Respiratory:  Positive for cough. Negative for shortness of breath and wheezing.   Cardiovascular: Negative.    Physical Exam  BP 126/82 (BP Location: Left Arm, Patient Position: Sitting, Cuff Size: Large)   Pulse 60   Temp 98.3 F (36.8 C) (Oral)   Ht 5' 5.5" (1.664 m)   Wt 264 lb 3.2 oz (119.8 kg)   SpO2 96%   BMI 43.30 kg/m  Physical  Exam Constitutional:      Appearance: Normal appearance.  HENT:     Head: Normocephalic and atraumatic.     Mouth/Throat:     Mouth: Mucous membranes are moist.     Pharynx: Oropharynx is clear.     Comments: Poor dentition  Cardiovascular:     Rate and Rhythm: Normal rate and regular rhythm.  Pulmonary:     Effort: Pulmonary effort is normal.     Breath sounds: Rhonchi present. No wheezing or rales.  Musculoskeletal:        General: Normal range of motion.  Skin:    General: Skin is warm and dry.  Neurological:     General: No focal deficit present.     Mental Status: She is alert and oriented to person, place, and time. Mental status is at baseline.  Psychiatric:        Mood and Affect: Mood normal.        Behavior: Behavior normal.        Thought Content: Thought content normal.        Judgment: Judgment normal.      Lab Results:  CBC    Component Value Date/Time   WBC 6.9 12/01/2021 1138   RBC 4.19 12/01/2021 1138   HGB 12.3 12/01/2021 1138   HGB 14.2 02/22/2020 1252   HCT 40.7 12/01/2021 1138   HCT 44.6 02/22/2020 1252   PLT 346 12/01/2021 1138   PLT 233 02/22/2020 1252   MCV 97.1 12/01/2021 1138   MCV 90 02/22/2020 1252   MCH 29.4 12/01/2021 1138   MCHC 30.2 12/01/2021 1138   RDW 13.2 12/01/2021 1138   RDW 12.6 02/22/2020 1252   LYMPHSABS 1.2 07/10/2020 1942   LYMPHSABS 1.3 02/22/2020 1252   MONOABS 0.9 07/10/2020 1942   EOSABS 0.1 07/10/2020 1942   EOSABS 0.1 02/22/2020 1252   BASOSABS 0.0 07/10/2020 1942   BASOSABS  0.0 02/22/2020 1252    BMET    Component Value Date/Time   NA 140 12/01/2021 1138   NA 139 02/22/2020 1252   K 4.0 12/01/2021 1138   CL 104 12/01/2021 1138   CO2 29 12/01/2021 1138   GLUCOSE 86 12/01/2021 1138   BUN 14 12/01/2021 1138   BUN 11 02/22/2020 1252   CREATININE 0.92 12/01/2021 1138   CALCIUM 8.9 12/01/2021 1138   GFRNONAA >60 12/01/2021 1138   GFRAA >60 04/19/2020 1219    BNP    Component Value Date/Time   BNP 262.5 (H) 10/31/2019 0940    ProBNP No results found for: "PROBNP"  Imaging: No results found.   Assessment & Plan:   Chronic cough - D/t reflux and hiatal hernia  - Continue Protonix '40mg'$  daily   Aspiration pneumonia ([place]) - Patient was treated for pneumonia in July at her assisted living with Levaquin. She has episodes of aspiration and choking after eating. She will be seeing speech therapy for eval/treat at her facility. Checking CXR today and sputum sample. Continue mucinex '600mg'$  twice daily. Adding flutter valve three times daily. FU in 1 month or sooner if needed.    Martyn Ehrich, NP 03/24/2022

## 2022-06-22 IMAGING — CT CT CHEST W/ CM
2 of 4 series · 15 of 36 positions shown, 18 images · IV contrast (agent unspecified)
Comparison: None.

CLINICAL DATA: Right lobe thyroid cancer staging * Tracking Code:
BO *

EXAM:
CT CHEST WITH CONTRAST
TECHNIQUE: Multidetector CT imaging of the chest was performed during
intravenous contrast administration.

[Series 2: routine chest with · axial · 0.66mm/px · z∈[-324,-70]mm · 12 of 151 slices shown, 15 images]
[im 12/151  mediastinal]
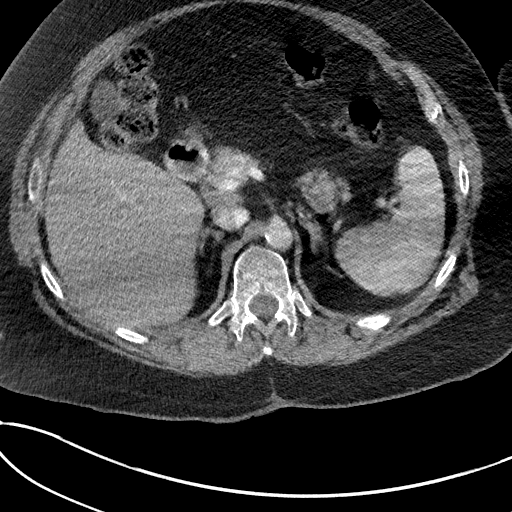
[im 12/151  lung]
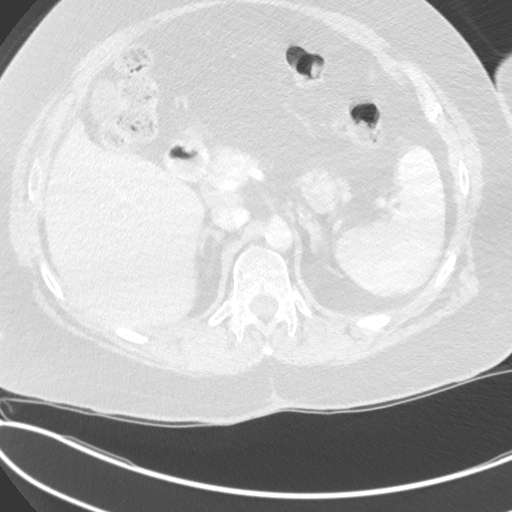
[im 24/151  lung]
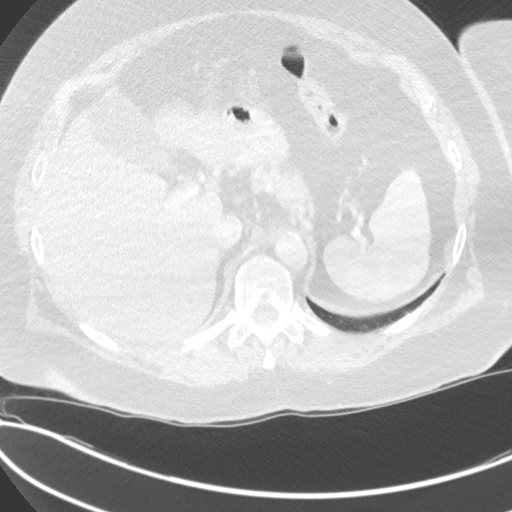
[im 35/151  lung]
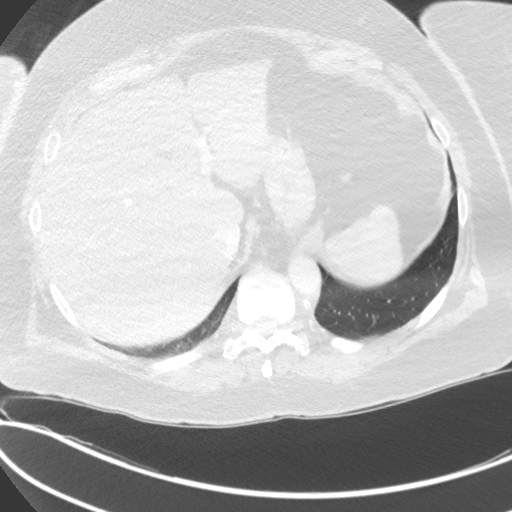
[im 47/151  lung]
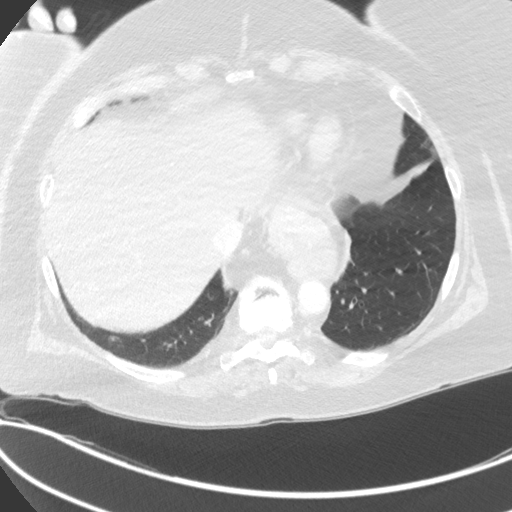
[im 58/151  mediastinal]
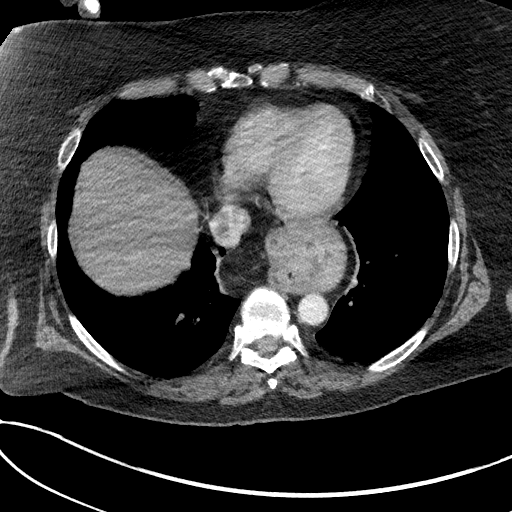
[im 58/151  lung]
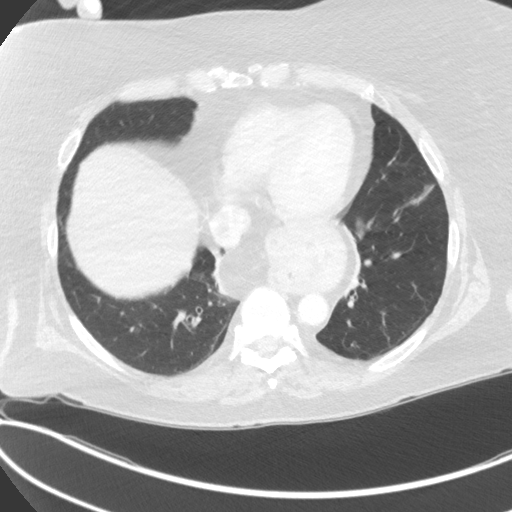
[im 70/151  lung]
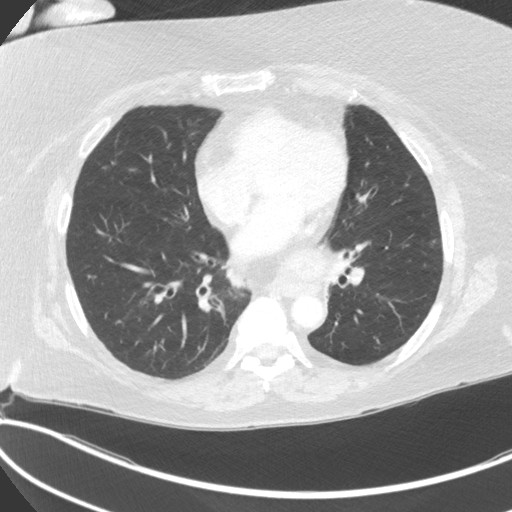
[im 81/151  lung]
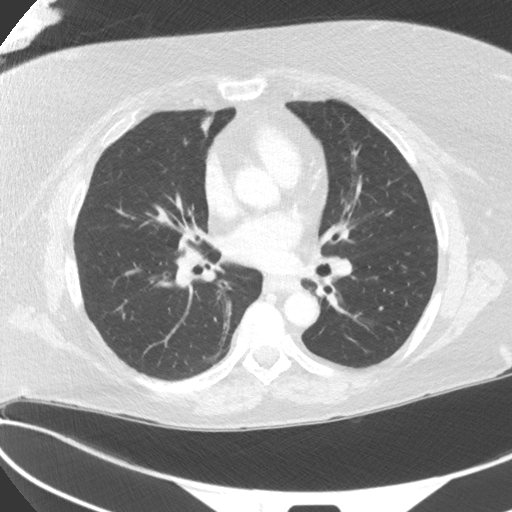
[im 93/151  lung]
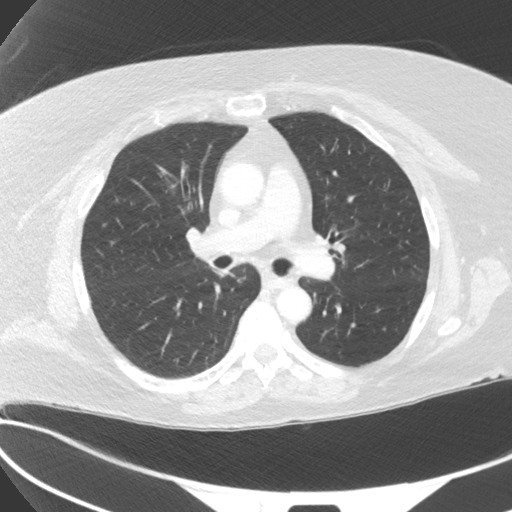
[im 104/151  mediastinal]
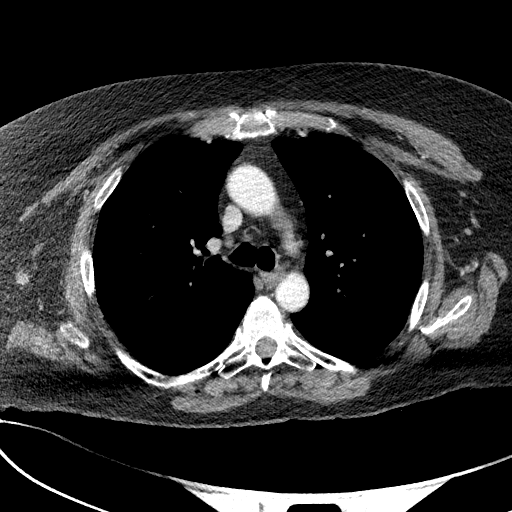
[im 104/151  lung]
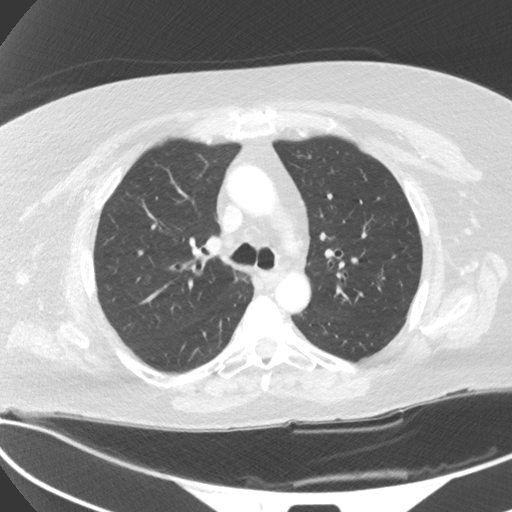
[im 116/151  lung]
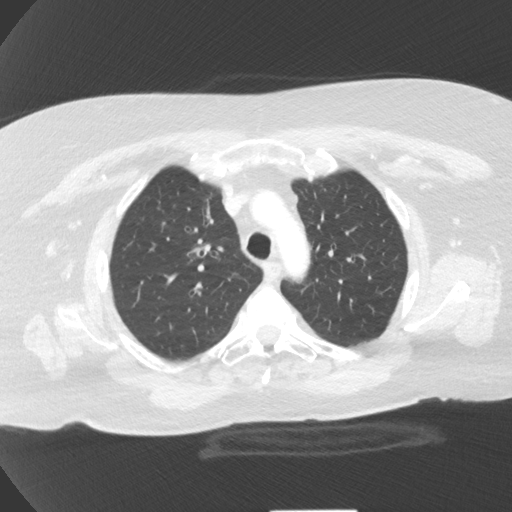
[im 127/151  lung]
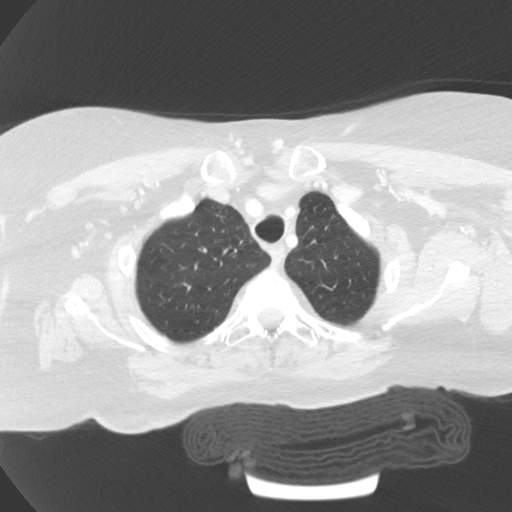
[im 139/151  lung]
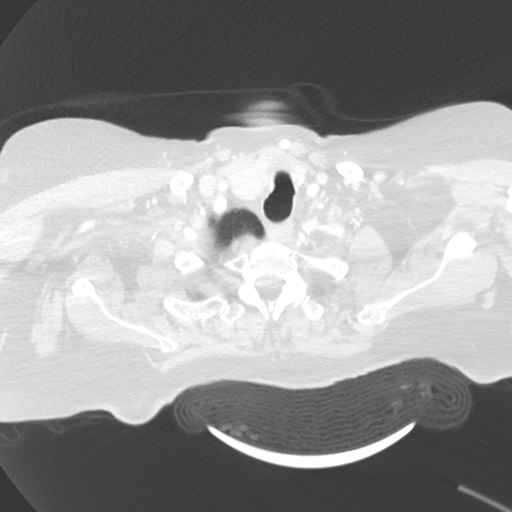

[Series 5: coronal · coronal · 0.61mm/px · 3 of 134 slices shown]
[im 27/134  lung]
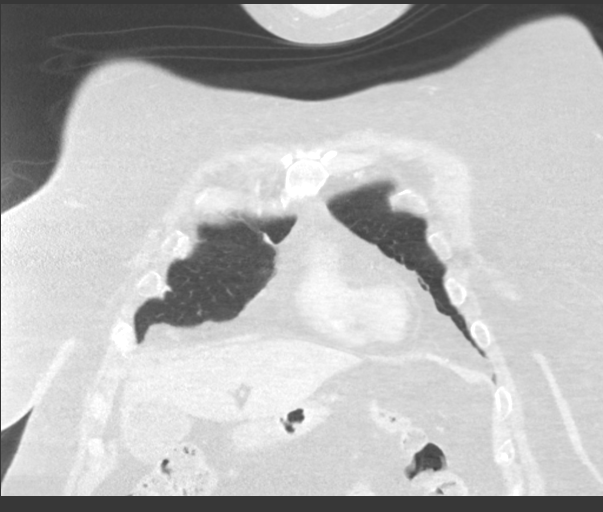
[im 54/134  lung]
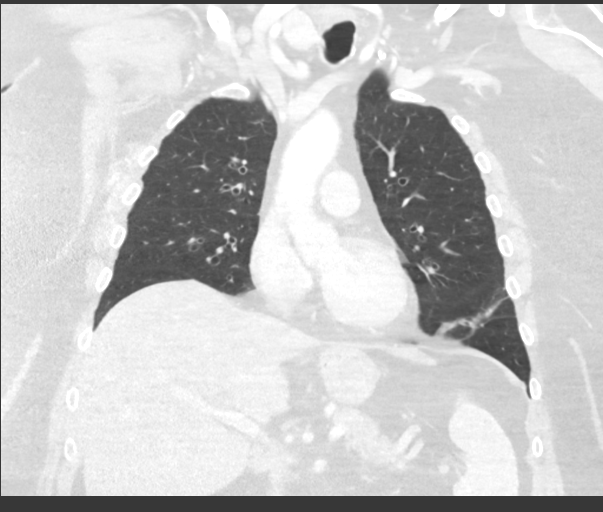
[im 80/134  lung]
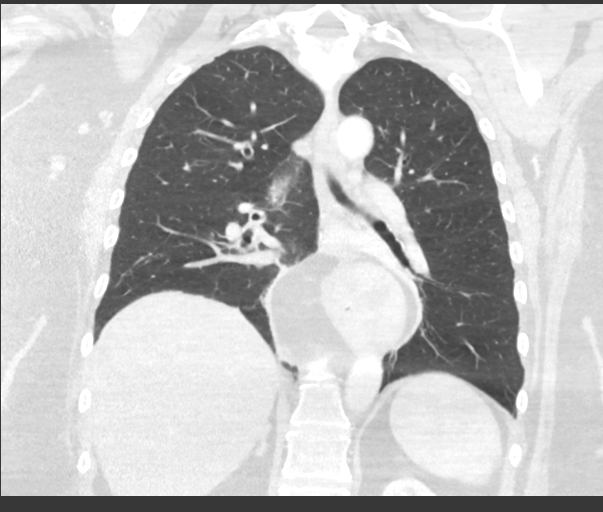

[15 of 36 positions shown; findings below may reference images not displayed]

RADIATION DOSE REDUCTION: This exam was performed according to the
departmental dose-optimization program which includes automated
exposure control, adjustment of the mA and/or kV according to
patient size and/or use of iterative reconstruction technique.

CONTRAST:  75mL OMNIPAQUE IOHEXOL 300 MG/ML  SOLN
FINDINGS: Cardiovascular: Scattered aortic atherosclerosis. Normal heart size.
No pericardial effusion.

Mediastinum/Nodes: No enlarged mediastinal, hilar, or axillary lymph
nodes. Moderate hiatal hernia with intrathoracic position of the
gastric fundus. Enlarged, heterogeneous right lobe of the thyroid
containing a calcification (series 2, image 10). Trachea, and
esophagus demonstrate no significant findings.

Lungs/Pleura: Diffuse bilateral bronchial wall thickening. Mild,
predominantly bandlike scarring of the bilateral lung bases, most
conspicuously in the anterior left lower lobe (series 4, image 104).
Small subpleural nodule of the lateral segment right middle lobe
measuring 0.5 cm (series 4, image 88). No pleural effusion or
pneumothorax.

Upper Abdomen: No acute abnormality. Near fluid attenuation
subcapsular lesion of the peripheral posterior liver dome, hepatic
segment VII, measuring 1.5 x 1.4 cm (series 2, image 110).

Musculoskeletal: No chest wall abnormality. No suspicious osseous
lesions identified.
IMPRESSION: 1. Enlarged, heterogeneous right lobe of the thyroid containing a
calcification, findings in keeping with reported thyroid malignancy.
This has been evaluated on previous imaging. (ref: [HOSPITAL].
[DATE]): 143-50).
2. Small subpleural nodule of the lateral segment right middle lobe
measuring 0.5 cm, nonspecific, most likely benign and incidental
sequelae of prior infection, isolated thyroid metastasis strongly
disfavored.
3. Diffuse bilateral bronchial wall thickening, consistent with
nonspecific infectious or inflammatory bronchitis.
4. Hiatal hernia.
5. Near fluid attenuation subcapsular lesion of the peripheral
posterior liver dome, hepatic segment VII, measuring 1.5 x 1.4 cm.
Although this likely represents a cyst or hemangioma, this is
incompletely characterized.

Aortic Atherosclerosis ([id number]-[email or web address].A).

## 2022-07-15 LAB — TSH: TSH: 2.69 u[IU]/mL (ref 0.450–4.500)

## 2022-07-15 LAB — T4, FREE: Free T4: 1.36 ng/dL (ref 0.82–1.77)

## 2022-07-21 ENCOUNTER — Ambulatory Visit (INDEPENDENT_AMBULATORY_CARE_PROVIDER_SITE_OTHER): Payer: Medicare Other | Admitting: "Endocrinology

## 2022-07-21 ENCOUNTER — Encounter: Payer: Self-pay | Admitting: "Endocrinology

## 2022-07-21 VITALS — BP 132/78 | HR 60 | Ht 65.5 in | Wt 263.0 lb

## 2022-07-21 DIAGNOSIS — C73 Malignant neoplasm of thyroid gland: Secondary | ICD-10-CM

## 2022-07-21 DIAGNOSIS — E890 Postprocedural hypothyroidism: Secondary | ICD-10-CM | POA: Diagnosis not present

## 2022-07-21 NOTE — Progress Notes (Signed)
07/21/2022, 1:22 PM  Endocrinology follow-up note   Subjective:    Patient ID: Renee Davidson, female    DOB: 09-Dec-1950, PCP Jodi Marble, MD   Past Medical History:  Diagnosis Date   Anxiety    Cancer [facility])    malignant thyroid   CHF (congestive heart failure) ([place])    hx of 09/2020 per note   Conversion disorder with seizures or convulsions    Depression    Diverticulosis    DVT (deep venous thrombosis) ([facility])    right   Dyspnea    Dysrhythmia    hx of SVT- 01/14/21 per note   Essential hypertension    Fracture of right humerus    GERD (gastroesophageal reflux disease)    Hiatal hernia    Humerus fracture    right   Hx of falling    Hyperlipidemia    Hypothyroidism    Mental developmental delay    OCD (obsessive compulsive disorder)    Osteoporosis    Parkinson disease    Parkinson's disease    Peptic ulcer    Pneumonia    hx of pneumonal several times per sister at preop   Thyroid disease    Thyroid nodule    Past Surgical History:  Procedure Laterality Date   BIOPSY  11/02/2019   Procedure: BIOPSY;  Surgeon: Gatha Mayer, MD;  Location: [facility];  Service: Endoscopy;;   COLONOSCOPY WITH PROPOFOL N/A 11/02/2019   Procedure: COLONOSCOPY WITH PROPOFOL;  Surgeon: Gatha Mayer, MD;  Location: [facility];  Service: Endoscopy;  Laterality: N/A;   ESOPHAGOGASTRODUODENOSCOPY (EGD) WITH PROPOFOL N/A 11/01/2019   Procedure: ESOPHAGOGASTRODUODENOSCOPY (EGD) WITH PROPOFOL;  Surgeon: Gatha Mayer, MD;  Location: [facility];  Service: Endoscopy;  Laterality: N/A;   HUMERUS FRACTURE SURGERY Right 08/13/2020   THYROID LOBECTOMY Right 12/21/2021   Procedure: RIGHT THYROID LOBECTOMY;  Surgeon: Armandina Gemma, MD;  Location: [facility];  Service: General;  Laterality: Right;   TONSILLECTOMY     Social History   Socioeconomic History   Marital status: Single    Spouse name: Not on file   Number of  children: 0   Years of education: 12   Highest education level: 12th grade  Occupational History   Occupation: retired    Comment: [employer]  Tobacco Use   Smoking status: Never   Smokeless tobacco: Never  Vaping Use   Vaping Use: Never used  Substance and Sexual Activity   Alcohol use: No   Drug use: No   Sexual activity: Never  Other Topics Concern   Not on file  Social History Narrative   Not on file   Social Determinants of Health   Financial Resource Strain: Low Risk  (07/04/2017)   Overall Financial Resource Strain (CARDIA)    Difficulty of Paying Living Expenses: Not hard at all  Food Insecurity: No Food Insecurity (07/04/2017)   Hunger Vital Sign    Worried About Running Out of Food in the Last Year: Never true    Ran Out of Food in the Last Year: Never true  Transportation Needs: No Transportation Needs (07/04/2017)   PRAPARE - Hydrologist (Medical): No  Lack of Transportation (Non-Medical): No  Physical Activity: Insufficiently Active (01/23/2018)   Exercise Vital Sign    Days of Exercise per Week: 3 days    Minutes of Exercise per Session: 30 min  Stress: No Stress Concern Present (07/04/2017)   [place]    Feeling of Stress : Not at all  Social Connections: Moderately Integrated (07/04/2017)   Social Connection and Isolation Panel [NHANES]    Frequency of Communication with Friends and Family: More than three times a week    Frequency of Social Gatherings with Friends and Family: More than three times a week    Attends Religious Services: More than 4 times per year    Active Member of [employer] or Organizations: Yes    Attends Music therapist: More than 4 times per year    Marital Status: Never married   Family History  Problem Relation Age of Onset   GI problems Mother    Hypertension Mother    Hyperlipidemia Mother    Heart disease Mother     Diabetes Father    Hyperlipidemia Father    Hypertension Father    Atrial fibrillation Father    Heart attack Father    Osteoporosis Father    Colon polyps Father    Hyperlipidemia Sister    Dysphagia Sister    Heart disease Maternal Grandfather    Colon cancer Maternal Grandfather    Diabetes Paternal Grandmother    Heart disease Paternal Grandmother    Outpatient Encounter Medications as of 07/21/2022  Medication Sig   levothyroxine (SYNTHROID) 100 MCG tablet Take 100 mcg by mouth daily before breakfast.   Acetaminophen 500 MG capsule Take 1,000 mg by mouth in the morning, at noon, and at bedtime.   allopurinol (ZYLOPRIM) 100 MG tablet Take 100 mg by mouth daily.   aspirin 81 MG EC tablet Take 81 mg by mouth daily. Swallow whole.   calcium carbonate (TUMS - DOSED IN MG ELEMENTAL CALCIUM) 500 MG chewable tablet Chew 2 tablets by mouth every 6 (six) hours as needed for indigestion or heartburn.   Cholecalciferol 50 MCG (2000 UT) CAPS Take 2,000 Units by mouth daily.   dextromethorphan (DELSYM) 30 MG/5ML liquid Take 60 mg by mouth every 12 (twelve) hours as needed for cough.   Dextromethorphan-guaiFENesin 10-100 MG/5ML liquid Take 10 mLs by mouth daily as needed (cough).   diclofenac Sodium (VOLTAREN) 1 % GEL Apply 1 application  topically in the morning, at noon, and at bedtime.   docusate sodium (COLACE) 100 MG capsule Take 100 mg by mouth daily.   escitalopram (LEXAPRO) 20 MG tablet Take 1 tablet (20 mg total) by mouth daily.   ferrous sulfate 325 (65 FE) MG tablet Take 1 tablet (325 mg total) by mouth daily with breakfast.   furosemide (LASIX) 20 MG tablet Take 20 mg by mouth daily.   hydrochlorothiazide (MICROZIDE) 12.5 MG capsule Take 12.5 mg by mouth daily.   LORazepam (ATIVAN) 0.5 MG tablet Take 0.5 mg by mouth at bedtime.   lovastatin (MEVACOR) 40 MG tablet Take 1 tablet (40 mg total) by mouth at bedtime.   methocarbamol (ROBAXIN) 500 MG tablet Take 500 mg by mouth every 8  (eight) hours as needed for muscle spasms.   metoprolol tartrate (LOPRESSOR) 25 MG tablet Take 12.5 mg by mouth 2 (two) times daily.   mineral oil-hydrophilic petrolatum (AQUAPHOR) ointment Apply 1 application. topically in the morning, at noon, and at bedtime.  Apply to buttocks and crease of buttocks for redness and irritation   MUCINEX 600 MG 12 hr tablet Take 600 mg by mouth 2 (two) times daily.   nystatin (MYCOSTATIN/NYSTOP) powder Apply 1 application. topically 3 (three) times daily. Under breasts and groin area   OLANZapine (ZYPREXA) 2.5 MG tablet Take 2.5 mg by mouth daily with supper.   pantoprazole (PROTONIX) 40 MG tablet TAKE 1 TABLET DAILY   polyethylene glycol (MIRALAX / GLYCOLAX) 17 g packet Take 17 g by mouth daily as needed for moderate constipation.   polyvinyl alcohol (LIQUIFILM TEARS) 1.4 % ophthalmic solution Place 1 drop into both eyes as needed for dry eyes.   potassium chloride 20 MEQ/15ML (10%) SOLN Take 20 mEq by mouth daily.   rivaroxaban (XARELTO) 20 MG TABS tablet Take 20 mg by mouth daily.   traMADol (ULTRAM) 50 MG tablet Take 50 mg by mouth every 6 (six) hours as needed for moderate pain.   vitamin C (ASCORBIC ACID) 250 MG tablet Take 250 mg by mouth daily.   [DISCONTINUED] levothyroxine (SYNTHROID) 75 MCG tablet Take 1 tablet (75 mcg total) by mouth daily before breakfast.   No facility-administered encounter medications on file as of 07/21/2022.   ALLERGIES: Allergies  Allergen Reactions   Keflex [Cephalexin] Other (See Comments)    Dizzy, lightheaded, falls   Doxycycline Rash    VACCINATION STATUS: Immunization History  Administered Date(s) Administered   Influenza, High Dose Seasonal PF 05/14/2016, 05/06/2017, 05/09/2018   Influenza,inj,Quad PF,6+ Mos 05/18/2013, 05/07/2014, 05/06/2015   Influenza-Unspecified 05/09/2020   Moderna Sars-Covid-2 Vaccination 09/06/2019, 10/04/2019, 06/18/2020, 11/25/2020   Pneumococcal Conjugate-13 01/17/2014    Pneumococcal Polysaccharide-23 07/01/2015   Tdap 05/31/2011, 05/11/2020   Zoster Recombinat (Shingrix) 07/04/2017, 10/18/2017   Zoster, Live 03/04/2014    HPI TEQUIA WOLMAN is 71 y.o. female who presents today with a medical history as above. she is being seen in follow-up after her recent partial thyroidectomy by Dr. Harlow Asa, postsurgical hypothyroidism.  She underwent right lobectomy/isthmusectomy of her thyroid on Dec 21, 2021.  Surgical sample showed noninvasive thyroid malignancy, see below.   In the interim, her levothyroxine was increased to 100 mcg p.o. daily before breakfast after her lab work in June showed evidence of under replacement.  She is known to have hypothyroidism for at least 10 years.    In November 2021 she was found to have incidental thyroid nodule of around the right lobe of her thyroid on CT scan of the chest.  Subsequent ultrasound confirmed 3 cm nodule on the right lobe with suspicious features.  She underwent fine-needle aspiration on November 24 which revealed atypia of undetermined significance.  A sample was sent for Afirma, results are consistent with approximately 50% risk of malignancy.    She denies dysphagia, shortness of breath, nor voice change.    She has no family history of thyroid malignancy.  She reports minimally fluctuating body weight.  She uses a walker to get around due to disequilibrium.    Review of Systems  Constitutional: no recent weight gain/loss, no fatigue, no subjective hyperthermia, no subjective hypothermia   Objective:       07/21/2022   10:09 AM 03/24/2022   12:10 PM 01/19/2022    3:08 PM  Vitals with BMI  Height 5' 5.5" 5' 5.5" 5' 5.5"  Weight 263 lbs 264 lbs 3 oz 267 lbs  BMI 43.08 25.85 27.78  Systolic 242 353 614  Diastolic 78 82 64  Pulse 60 60 60  BP 132/78   Pulse 60   Ht 5' 5.5" (1.664 m)   Wt 263 lb (119.3 kg)   BMI 43.10 kg/m   Wt Readings from Last 3 Encounters:  07/21/22 263 lb (119.3 kg)   03/24/22 264 lb 3.2 oz (119.8 kg)  01/19/22 267 lb (121.1 kg)    Physical Exam  Constitutional:  Body mass index is 43.1 kg/m.,  not in acute distress, normal state of mind Eyes: PERRLA, EOMI, no exophthalmos ENT: moist mucous membranes, + palpable thyroid,  gross cervical lymphadenopathy   CMP ( most recent) CMP     Component Value Date/Time   NA 140 12/01/2021 1138   NA 139 02/22/2020 1252   K 4.0 12/01/2021 1138   CL 104 12/01/2021 1138   CO2 29 12/01/2021 1138   GLUCOSE 86 12/01/2021 1138   BUN 14 12/01/2021 1138   BUN 11 02/22/2020 1252   CREATININE 0.92 12/01/2021 1138   CALCIUM 8.9 12/01/2021 1138   PROT 7.6 07/10/2020 1942   PROT 6.6 02/22/2020 1252   ALBUMIN 4.0 07/10/2020 1942   ALBUMIN 4.0 02/22/2020 1252   AST 20 07/10/2020 1942   ALT 16 07/10/2020 1942   ALKPHOS 109 07/10/2020 1942   BILITOT 0.6 07/10/2020 1942   BILITOT 0.3 02/22/2020 1252   GFRNONAA >60 12/01/2021 1138   GFRAA >60 04/19/2020 1219     Diabetic Labs (most recent): Lab Results  Component Value Date   HGBA1C 5.7 06/08/2017     Lipid Panel ( most recent) Lipid Panel     Component Value Date/Time   CHOL 163 02/22/2020 1252   TRIG 74 02/22/2020 1252   TRIG 49 12/18/2014 1209   HDL 53 02/22/2020 1252   HDL 55 12/18/2014 1209   CHOLHDL 3.1 02/22/2020 1252   LDLCALC 96 02/22/2020 1252   LDLCALC 89 01/17/2014 0946   LABVLDL 14 02/22/2020 1252      Lab Results  Component Value Date   TSH 2.690 07/14/2022   TSH 6.640 (H) 01/12/2022   TSH 0.893 02/22/2020   TSH 0.761 12/04/2019   TSH 2.407 10/31/2019   TSH 2.130 03/26/2019   TSH 1.360 07/07/2018   TSH 0.282 (L) 05/09/2018   TSH 0.428 (L) 11/04/2017   TSH 0.727 06/06/2017   FREET4 1.36 07/14/2022   FREET4 1.07 01/12/2022    Fine-needle aspiration of right lobe thyroid nodule on June 25, 2020 FINAL MICROSCOPIC DIAGNOSIS:  - Atypia of undetermined significance (Bethesda category III)   SPECIMEN ADEQUACY:   Satisfactory for evaluation   DIAGNOSTIC COMMENTS:  This specimen will be sent for Afirma testing.   Molecular pathology results were received, indicating approximately 50% risk of malignancy.  Dec 21, 2021 FINAL MICROSCOPIC DIAGNOSIS:   A. 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  Please see the following synoptic report. Assessment & Plan:   1.  Malignant neoplasm of the thyroid 2. Acquired hypothyroidism   See note above. Patient is status post right hemithyroidectomy on Dec 21, 2021.  This was performed by Dr. Harlow Asa.  Her final microscopic diagnosis showed noninvasive follicular thyroid neoplasm with papillary-like nuclear features (NIFTP) and clear margins of resection.  Based on the cytology description, she will not need adjuvant treatment.  She would not be considered for radioactive iodine thyroid ablation. She still had left lobe of her thyroid in place.  Her previsit thyroid function tests are consistent with  appropriate replacement on her current dose of Synthroid at 100 mcg p.o. daily before breakfast.  She is advised to continue the same.  - We discussed about the correct intake of her thyroid hormone, on empty stomach at fasting, with water, separated by at least 30 minutes from breakfast and other medications,  and separated by more than 4 hours from calcium, iron, multivitamins, acid reflux medications (PPIs). -Patient is made aware of the fact that thyroid hormone replacement is needed for life, dose to be adjusted by periodic monitoring of thyroid function tests.   - she is advised to maintain close follow up with Jodi Marble, MD for primary care needs.   I spent 21 minutes in the care of the patient today including review of labs from Thyroid Function, CMP, and other relevant labs ; imaging/biopsy records (current  and previous including abstractions from other facilities); face-to-face time discussing  her lab results and symptoms, medications doses, her options of short and long term treatment based on the latest standards of care / guidelines;   and documenting the encounter.  Garvin Fila Valcarcel  participated in the discussions, expressed understanding, and voiced agreement with the above plans.  All questions were answered to her satisfaction. she is encouraged to contact clinic should she have any questions or concerns prior to her return visit.    Follow up plan: Return in about 6 months (around 01/20/2023) for F/U with Pre-visit Labs.   Glade Lloyd, MD [facility] [address] [place], [place] [address] Phone: [phone]  Fax: [phone]     07/21/2022, 1:22 PM  This note was partially dictated with voice recognition software. Similar sounding words can be transcribed inadequately or may not  be corrected upon review.

## 2022-08-29 IMAGING — DX DG CHEST 2V
2 series · 2 of 2 positions shown · non-contrast
Comparison: Chest radiograph 10/28/2021

CLINICAL DATA: Preoperative evaluation.

EXAM:
CHEST - 2 VIEW

[chest pa]
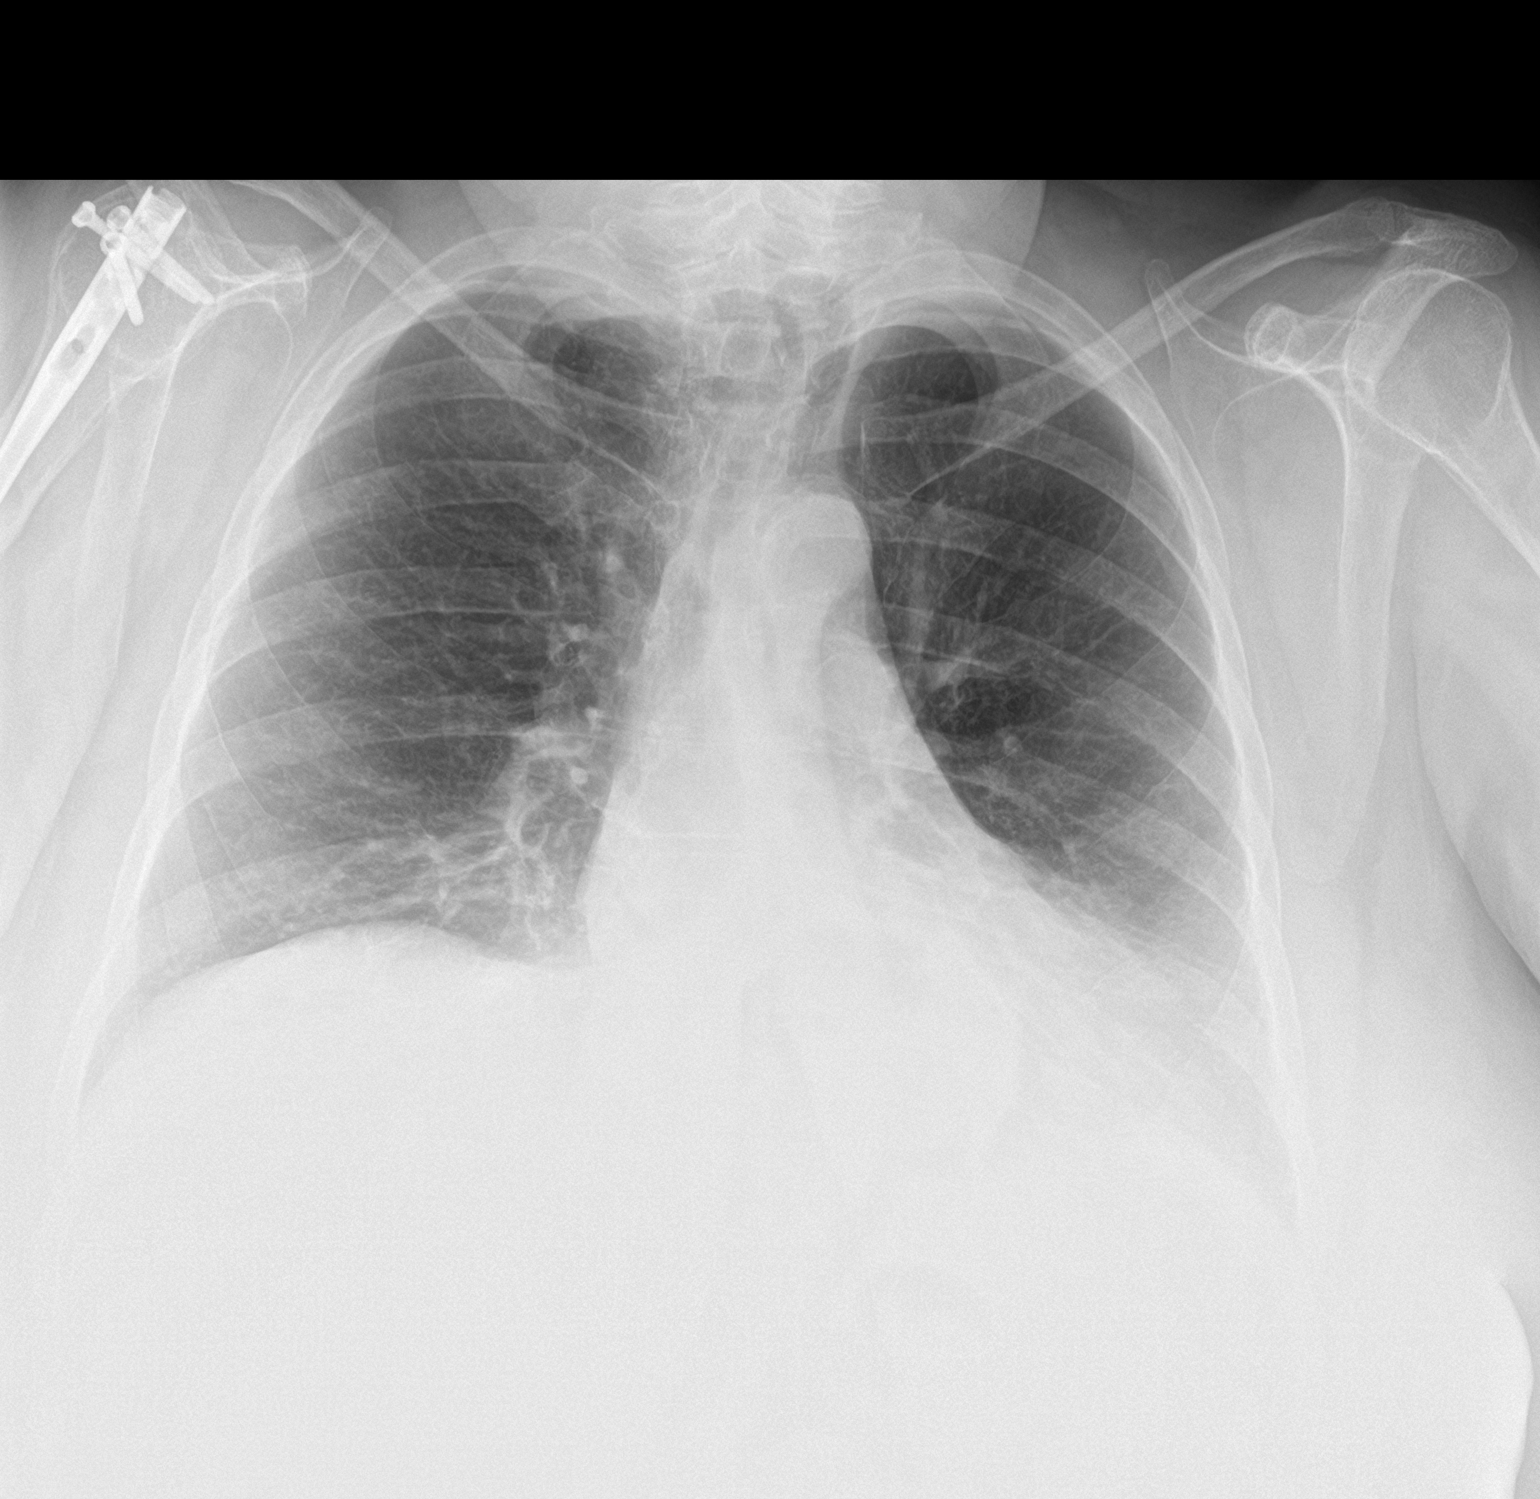

[chest lat]
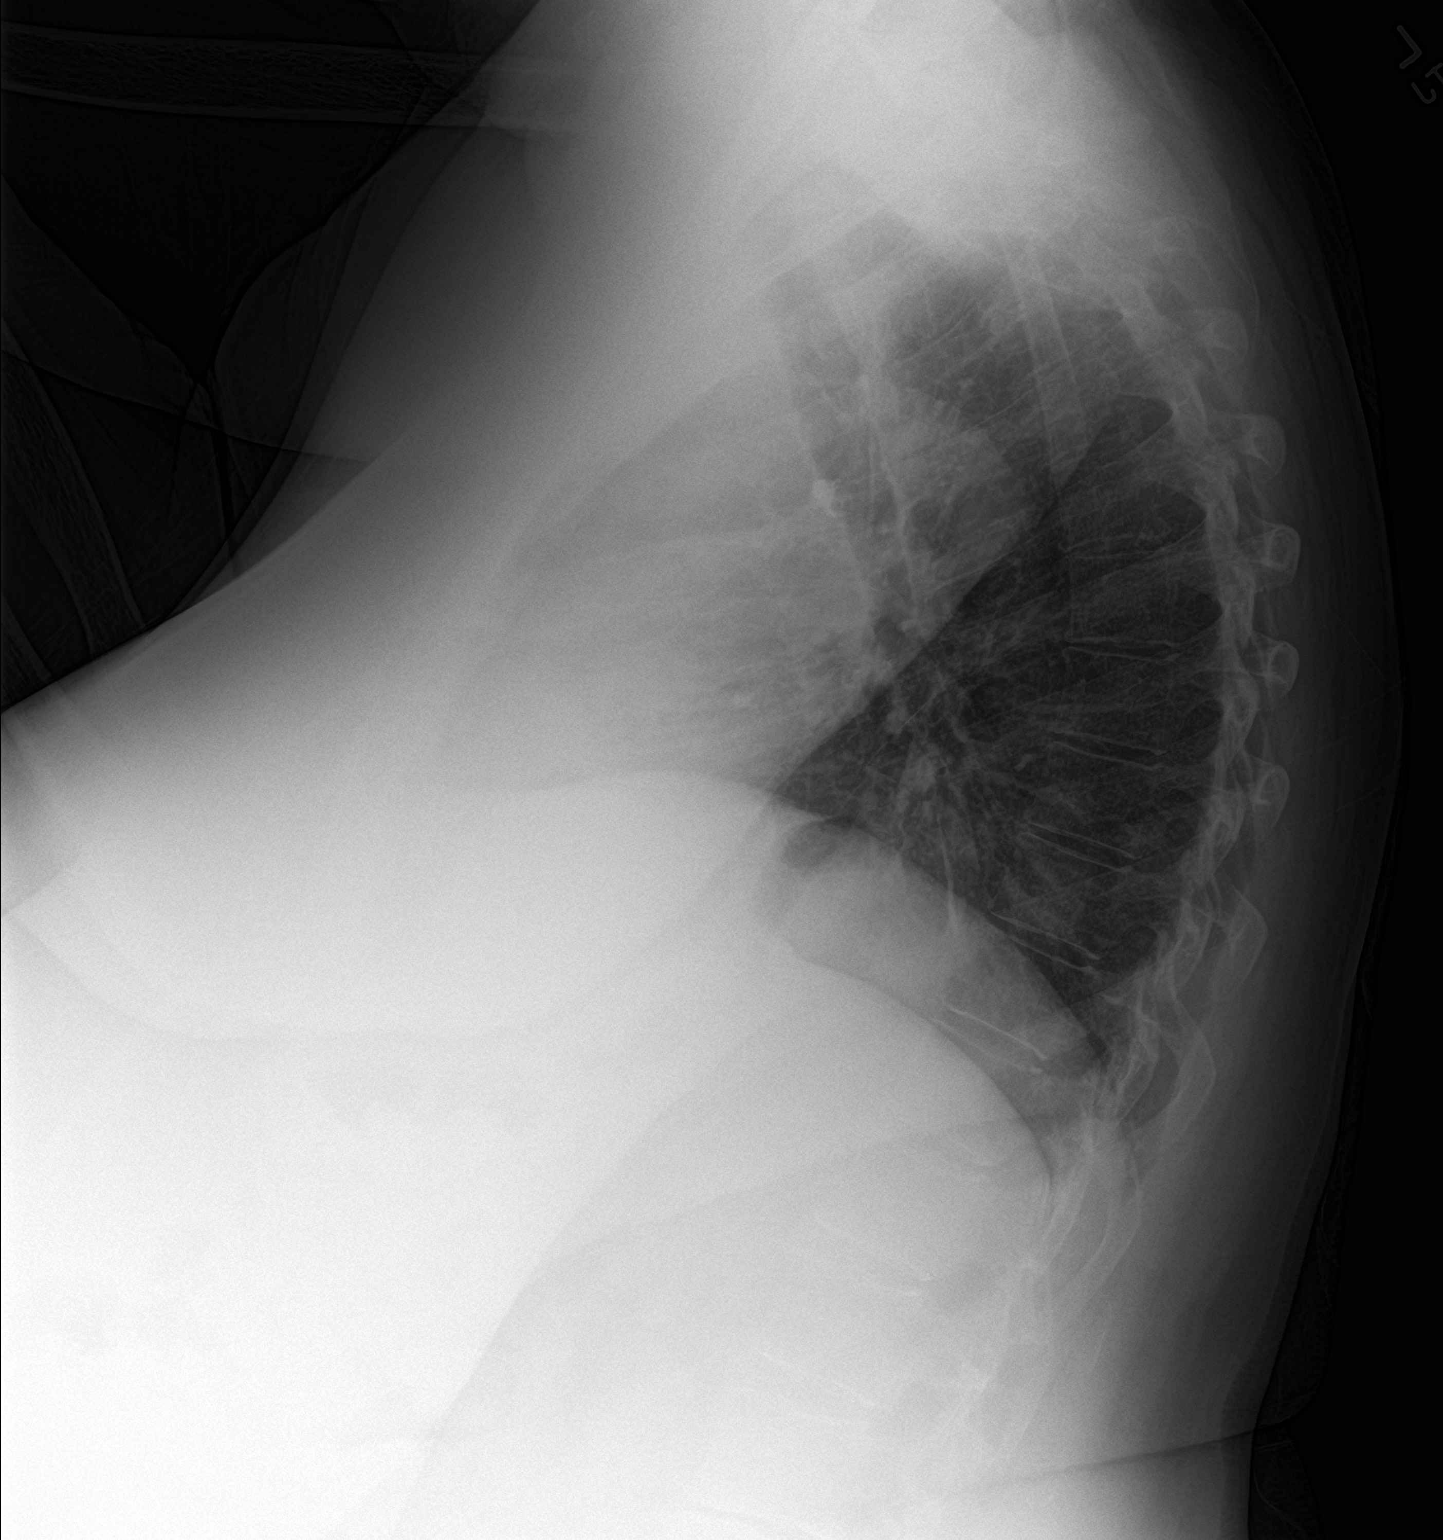

[2 of 2 positions shown; findings below may reference images not displayed]

FINDINGS: The heart size and mediastinal contours are within normal limits.
Both lungs are clear. The visualized skeletal structures are
unremarkable.
IMPRESSION: No active cardiopulmonary disease.

## 2022-09-30 ENCOUNTER — Encounter: Payer: Self-pay | Admitting: Radiology

## 2022-11-08 ENCOUNTER — Telehealth: Payer: Self-pay | Admitting: Primary Care

## 2022-11-08 ENCOUNTER — Encounter: Payer: Self-pay | Admitting: Primary Care

## 2022-11-08 ENCOUNTER — Ambulatory Visit (INDEPENDENT_AMBULATORY_CARE_PROVIDER_SITE_OTHER): Payer: Medicare Other | Admitting: Primary Care

## 2022-11-08 VITALS — BP 112/76 | HR 77 | Ht 65.5 in | Wt 269.0 lb

## 2022-11-08 DIAGNOSIS — K449 Diaphragmatic hernia without obstruction or gangrene: Secondary | ICD-10-CM

## 2022-11-08 DIAGNOSIS — J690 Pneumonitis due to inhalation of food and vomit: Secondary | ICD-10-CM

## 2022-11-08 LAB — CBC WITH DIFFERENTIAL/PLATELET
Basophils Absolute: 0 10*3/uL (ref 0.0–0.1)
Basophils Relative: 0.5 % (ref 0.0–3.0)
Eosinophils Absolute: 0.2 10*3/uL (ref 0.0–0.7)
Eosinophils Relative: 1.7 % (ref 0.0–5.0)
HCT: 42.8 % (ref 36.0–46.0)
Hemoglobin: 14.3 g/dL (ref 12.0–15.0)
Lymphocytes Relative: 15.9 % (ref 12.0–46.0)
Lymphs Abs: 1.6 10*3/uL (ref 0.7–4.0)
MCHC: 33.4 g/dL (ref 30.0–36.0)
MCV: 94.9 fl (ref 78.0–100.0)
Monocytes Absolute: 0.9 10*3/uL (ref 0.1–1.0)
Monocytes Relative: 9.1 % (ref 3.0–12.0)
Neutro Abs: 7.3 10*3/uL (ref 1.4–7.7)
Neutrophils Relative %: 72.8 % (ref 43.0–77.0)
Platelets: 337 10*3/uL (ref 150.0–400.0)
RBC: 4.51 Mil/uL (ref 3.87–5.11)
RDW: 13.7 % (ref 11.5–15.5)
WBC: 10 10*3/uL (ref 4.0–10.5)

## 2022-11-08 LAB — BASIC METABOLIC PANEL
BUN: 15 mg/dL (ref 6–23)
CO2: 29 mEq/L (ref 19–32)
Calcium: 9.6 mg/dL (ref 8.4–10.5)
Chloride: 96 mEq/L (ref 96–112)
Creatinine, Ser: 1.11 mg/dL (ref 0.40–1.20)
GFR: 49.16 mL/min — ABNORMAL LOW (ref >60.00)
Glucose, Bld: 95 mg/dL (ref 70–99)
Potassium: 4.3 mEq/L (ref 3.5–5.1)
Sodium: 136 mEq/L (ref 135–145)

## 2022-11-08 MED ORDER — BENZONATATE 100 MG PO CAPS: 100.0000 mg | ORAL_CAPSULE | Freq: Three times a day (TID) | ORAL | 0 refills | Status: AC | PRN

## 2022-11-08 NOTE — Progress Notes (Signed)
  ID: Renee Davidson, female    DOB: 03/09/1951, 72 y.o.   MRN: [id number]  No chief complaint on file.   Referring provider: [name], MD  HPI: 72 year old female, never smoked.  Past medical history significant for GERD, hypothyroidism, thyroid nodule, osteoporosis, obesity.  Patient of Dr. Wynona Neat, last seen in office on 12/24/2020.  Shortness of breath felt to be multifactorial due to deconditioning and weight.  Cough also felt to be multifactorial related to reflux.  Previous [facility] pulmonary encounter:  03/24/2022 Patient presents today for acute overview due to cough. Accompanied by her sister. She lives at assisted living facility. She has had a chronic cough, unclear how long. Cough can be productive with green sputum. Cough primarily occurs at night. She has a large hiatal hernia, no surgical options. She will have episodes of choking/ aspiration. Recent pneumonia in July treated with Levaquin. She is currently taking mucinex  twice daily and prn delsym. She has been ordered for speech therapy eval and treat.   Chronic cough - D/t reflux and hiatal hernia  - Continue Protonix  daily    Aspiration pneumonia (HCC) - Patient was treated for pneumonia in July at her assisted living with Levaquin. She has episodes of aspiration and choking after eating. She will be seeing speech therapy for eval/treat at her facility. Checking CXR today and sputum sample. Continue mucinex  twice daily. Adding flutter valve three times daily. FU in 1 month or sooner if needed.   11/08/2022- Interim hx  Patient has a chronic cough which has been worse over the last month. Cough is congested but non-productive. She has associated shortness of breath along with post nasal drip. Walking short distances causes her to get winded. She wears oxygen as needed at her facility. She had an episode of choking 2 weeks ago. She has a moderate hiatal hernia. Not surgical candidate. She takes reflux  medication. Patient had chest x-ray on 10/08/2022 that showed pulmonary infiltrate in left lung base.  Moderate hiatal hernia.  She was treated with course of Levaquin. She had influenza A last week. Repeat chest x-ray on 11/02/2022 that showed opacification of the left hemidiaphragm with mild patchy density compatible with left basilar pneumonia or atelectasis.  Small left pleural effusion. She was prescribed azithromycin from 11/01/22-11/05/22 and prednisone x 5 days.    Allergies  Allergen Reactions   Keflex [Cephalexin] Other (See Comments)    Dizzy, lightheaded, falls   Sulfamethoxazole Other (See Comments)   Doxycycline Rash    Immunization History  Administered Date(s) Administered   Influenza Whole 05/18/2013, 05/07/2014, 05/06/2015   Influenza, High Dose Seasonal PF 05/14/2016, 05/06/2017, 05/09/2018   Influenza,inj,Quad PF,6+ Mos 05/18/2013, 05/07/2014, 05/06/2015   Influenza-Unspecified 05/09/2020   Moderna Sars-Covid-2 Vaccination 09/06/2019, 10/04/2019, 06/18/2020, 11/25/2020, 04/30/2021   Pneumococcal Conjugate-13 01/17/2014   Pneumococcal Polysaccharide-23 07/01/2015   Tdap 05/31/2011, 05/11/2020   Zoster Recombinat (Shingrix) 07/04/2017, 10/18/2017   Zoster, Live 03/04/2014    Past Medical History:  Diagnosis Date   Anxiety    Cancer    malignant thyroid   CHF (congestive heart failure)    hx of 09/2020 per note   Conversion disorder with seizures or convulsions    Depression    Diverticulosis    DVT (deep venous thrombosis)    right   Dyspnea    Dysrhythmia    hx of SVT- 01/14/21 per note   Essential hypertension    Fracture of right humerus    GERD (gastroesophageal  reflux disease)    Hiatal hernia    Humerus fracture    right   Hx of falling    Hyperlipidemia    Hypothyroidism    Mental developmental delay    OCD (obsessive compulsive disorder)    Osteoporosis    Parkinson disease    Parkinson's disease    Peptic ulcer    Pneumonia    hx of pneumonal  several times per sister at preop   Thyroid disease    Thyroid nodule     Tobacco History: Social History   Tobacco Use  Smoking Status Never  Smokeless Tobacco Never   Counseling given: Not Answered   Outpatient Medications Prior to Visit  Medication Sig Dispense Refill   Acetaminophen 500 MG capsule Take 1,000 mg by mouth in the morning, at noon, and at bedtime.     albuterol (VENTOLIN HFA) 108 (90 Base) MCG/ACT inhaler Inhale into the lungs 2 (two) times daily as needed for wheezing or shortness of breath.     allopurinol (ZYLOPRIM) 100 MG tablet Take 100 mg by mouth daily.     aspirin 81 MG EC tablet Take 81 mg by mouth daily. Swallow whole.     cetirizine (ZYRTEC) 10 MG tablet Take 10 mg by mouth daily.     dextromethorphan (DELSYM) 30 MG/5ML liquid Take 60 mg by mouth every 12 (twelve) hours as needed for cough.     diclofenac Sodium (VOLTAREN) 1 % GEL Apply 1 application  topically in the morning, at noon, and at bedtime.     escitalopram (LEXAPRO) 20 MG tablet Take 1 tablet (20 mg total) by mouth daily. 90 tablet 1   ferrous sulfate 325 (65 FE) MG tablet Take 1 tablet (325 mg total) by mouth daily with breakfast. 90 tablet 1   furosemide (LASIX) 20 MG tablet Take 20 mg by mouth daily.     hydrochlorothiazide (MICROZIDE) 12.5 MG capsule Take 12.5 mg by mouth daily.     hydrocortisone cream 1 % Apply 1 Application topically 3 (three) times daily.     ipratropium-albuterol (DUONEB) 0.5-2.5 (3) MG/3ML SOLN Take 3 mLs by nebulization every 6 (six) hours as needed.     levothyroxine (SYNTHROID) 100 MCG tablet Take 100 mcg by mouth daily before breakfast.     LORazepam (ATIVAN) 0.5 MG tablet Take 0.5 mg by mouth at bedtime.     lovastatin (MEVACOR) 40 MG tablet Take 1 tablet (40 mg total) by mouth at bedtime. 90 tablet 1   methocarbamol (ROBAXIN) 500 MG tablet Take 500 mg by mouth every 8 (eight) hours as needed for muscle spasms.     metoprolol tartrate (LOPRESSOR) 25 MG tablet  Take 12.5 mg by mouth 2 (two) times daily.     mineral oil-hydrophilic petrolatum (AQUAPHOR) ointment Apply 1 application. topically in the morning, at noon, and at bedtime. Apply to buttocks and crease of buttocks for redness and irritation     MUCINEX 600 MG 12 hr tablet Take 600 mg by mouth 2 (two) times daily.     OLANZapine (ZYPREXA) 2.5 MG tablet Take 2.5 mg by mouth daily with supper.     oxybutynin (DITROPAN-XL) 5 MG 24 hr tablet Take 5 mg by mouth at bedtime.     pantoprazole (PROTONIX) 40 MG tablet TAKE 1 TABLET DAILY 90 tablet 0   potassium chloride 20 MEQ/15ML (10%) SOLN Take 20 mEq by mouth daily.     rivaroxaban (XARELTO) 20 MG TABS tablet Take 20 mg by mouth  daily.     traMADol (ULTRAM) 50 MG tablet Take 50 mg by mouth every 6 (six) hours as needed for moderate pain.     vitamin C (ASCORBIC ACID) 250 MG tablet Take 250 mg by mouth daily.     Dextromethorphan-guaiFENesin 10-100 MG/5ML liquid Take 10 mLs by mouth daily as needed (cough). (Patient not taking: Reported on 11/08/2022)     docusate sodium (COLACE) 100 MG capsule Take 100 mg by mouth daily. (Patient not taking: Reported on 11/08/2022)     nystatin (MYCOSTATIN/NYSTOP) powder Apply 1 application. topically 3 (three) times daily. Under breasts and groin area (Patient not taking: Reported on 11/08/2022)     polyethylene glycol (MIRALAX / GLYCOLAX) 17 g packet Take 17 g by mouth daily as needed for moderate constipation. (Patient not taking: Reported on 11/08/2022)     polyvinyl alcohol (LIQUIFILM TEARS) 1.4 % ophthalmic solution Place 1 drop into both eyes as needed for dry eyes. (Patient not taking: Reported on 11/08/2022)     benzonatate (TESSALON) 100 MG capsule Take by mouth as needed for cough. (Patient not taking: Reported on 11/08/2022)     calcium carbonate (TUMS - DOSED IN MG ELEMENTAL CALCIUM) 500 MG chewable tablet Chew 2 tablets by mouth every 6 (six) hours as needed for indigestion or heartburn. (Patient not taking: Reported on  11/08/2022)     Cholecalciferol 50 MCG (2000 UT) CAPS Take 2,000 Units by mouth daily. (Patient not taking: Reported on 11/08/2022)     No facility-administered medications prior to visit.   Review of Systems  Review of Systems  Constitutional: Negative.   HENT:  Positive for congestion.   Respiratory:  Positive for cough and shortness of breath.    Physical Exam  BP 112/76 (BP Location: Left Arm, Patient Position: Sitting, Cuff Size: Large)   Pulse 77   Ht 5' 5.5" (1.664 m)   Wt 269 lb (122 kg)   SpO2 92%   BMI 44.08 kg/m  Physical Exam Constitutional:      General: She is not in acute distress.    Appearance: Normal appearance. She is obese. She is not ill-appearing.  Cardiovascular:     Rate and Rhythm: Normal rate.  Pulmonary:     Effort: Pulmonary effort is normal.     Breath sounds: Rhonchi present. No wheezing or rales.  Musculoskeletal:        General: Normal range of motion.  Skin:    General: Skin is warm and dry.  Neurological:     General: No focal deficit present.     Mental Status: She is oriented to person, place, and time. Mental status is at baseline.  Psychiatric:        Mood and Affect: Mood normal.        Behavior: Behavior normal.        Thought Content: Thought content normal.        Judgment: Judgment normal.      Lab Results:  CBC    Component Value Date/Time   WBC 10.0 11/08/2022 1037   RBC 4.51 11/08/2022 1037   HGB 14.3 11/08/2022 1037   HGB 14.2 02/22/2020 1252   HCT 42.8 11/08/2022 1037   HCT 44.6 02/22/2020 1252   PLT 337.0 11/08/2022 1037   PLT 233 02/22/2020 1252   MCV 94.9 11/08/2022 1037   MCV 90 02/22/2020 1252   MCH 29.4 12/01/2021 1138   MCHC 33.4 11/08/2022 1037   RDW 13.7 11/08/2022 1037   RDW 12.6 02/22/2020  1252   LYMPHSABS 1.6 11/08/2022 1037   LYMPHSABS 1.3 02/22/2020 1252   MONOABS 0.9 11/08/2022 1037   EOSABS 0.2 11/08/2022 1037   EOSABS 0.1 02/22/2020 1252   BASOSABS 0.0 11/08/2022 1037   BASOSABS 0.0  02/22/2020 1252    BMET    Component Value Date/Time   NA 136 11/08/2022 1037   NA 139 02/22/2020 1252   K 4.3 11/08/2022 1037   CL 96 11/08/2022 1037   CO2 29 11/08/2022 1037   GLUCOSE 95 11/08/2022 1037   BUN 15 11/08/2022 1037   BUN 11 02/22/2020 1252   CREATININE 1.11 11/08/2022 1037   CALCIUM 9.6 11/08/2022 1037   GFRNONAA >60 12/01/2021 1138   GFRAA >60 04/19/2020 1219    BNP    Component Value Date/Time   BNP 262.5 (H) 10/31/2019 0940    ProBNP No results found for: "PROBNP"  Imaging: No results found.   Assessment & Plan:   Aspiration pneumonia (HCC) - Hx recurrent aspiration pneumonia. Treated in March and April 2024 with antibiotics. She had influenza A two weeks ago. She has a chronic non-productive cough which has been worse last 4 weeks. CXR on 11/02/22 showed opacification left hemidiaphragm with mild patchy density compatible with left basilar pneumonia or atelectasis. Recommend getting CT chest to better evaluate left lung base. Recommend patient take mucinex twice daily and use flutter valve three times daily. Patient education given on aspiration precautions. If able would get sputum culture in the future.   Hiatal hernia - Patient has moderate hiatal hernia. Not surgical candidate. Encourage weight loss and continue PPI.   40 mins spent on case; > 50% face to face   Glenford Bayley, NP 11/08/2022

## 2022-11-08 NOTE — Patient Instructions (Addendum)
Recommendations - Continue Mucinex 600 twice daily - Use flutter valve three times a day  - Start tessalon perles as needed every 8 hours for cough suppression  - Make sure you are taking diuretics  - Continue reflux medication and work on weight loss  - Practice good oral hygiene   Orders; - CT chest wo contrast re: recurrent aspiration pneumonia/bronchitis   Follow-up - 4-6 weeks with Dr. Wynona Neat for recurrent PNA   Aspiration Precautions, Adult Aspiration is when a person breathes in (inhales) a liquid or other material, and it goes into the lungs. Adults who have conditions that affect their brain or spinal cord or who have trouble swallowing, a decreased gag reflex, or trouble moving around (mobility) are at risk for this condition. Things that can be inhaled into the lungs include: Food. Any type of liquid. This includes drinks and saliva. Stomach contents. This includes vomit and stomach acid. This condition can cause an infection in the lungs (pneumonia). Certain steps can be taken to reduce the risk of aspiration. What are the signs of aspiration? Signs may include: Coughing. A person may: Cough after they swallow food or liquids. Cough up mucus (sputum) that is yellow, tan, or green. It may also smell bad or have pieces of food in it. Cough when lying down or have to sit up quickly after lying down. Have a hoarse, barky cough. Trouble breathing. This may include: Breathing very quickly or very slowly. Loud breathing. High-pitched whistling sounds during breathing, most often when breathing out (wheezing). Trouble eating. This may involve: Clearing the throat often while eating. Drooling while eating. Having a feeling of fullness in the throat or like something is stuck in the throat. Having a runny nose and watery eyes while eating. Speaking problems. This may include a hoarse voice or inability to speak. Choking often while eating. Changes in skin color. The skin may  look red or blue. Pain in the chest or back during or right after eating. What problems can aspiration cause? This condition can cause problems such as: Losing weight because the person is not absorbing the nutrients they need. Loss of enjoyment and the social benefits of eating. Choking. Lung irritation. This can happen if someone aspirates acidic food or drinks. Pneumonia. Lung abscess. This is a collection of infected liquid (pus) in the lungs. In serious cases, death can occur. What can I do to prevent aspiration? Caring for someone who has a feeding tube If you are caring for someone who has a feeding tube and cannot eat or drink safely by mouth: Keep the person in an upright position as much as possible. Do not lay the person flat if they get feedings around the clock (continuous feedings). If you need to lay the person flat for any reason, turn the feeding pump off. Check for left over liquid (residuals) in the stomach as told by the health care provider. Ask the health care provider what residual amount is too high. Caring for someone who can eat and drink safely by mouth If you are caring for someone who can eat and drink safely by mouth: Have the person sit in an upright position when they eat or drink. This can be done by: Having the person sit up in a chair. Positioning the person in bed so they are upright. This can be done if sitting in a chair is not possible. Remind the person to eat slowly and chew well. Make sure the person is awake and alert while  eating. Never put food or liquids in the mouth of a person who is not fully alert. Do not distract the person, especially if they have problems with thinking or memory (cognitive problems). Allow foods to cool. Hot foods may be harder to swallow. Provide small meals often rather than three large meals. This may help the person to not feel tired when eating. After the person is done eating: Check their mouth well for leftover  food. Keep them sitting upright for 30-45 minutes. Do not serve food or drink within 2 hours of bedtime. General instructions To prevent aspiration in someone who can eat and drink safely by mouth: Feed small bites of food. Do not force-feed. If the person is on a diet because of problems with swallowing (dysphagia diet), follow the food and drink consistency that the health care provider recommends. You may be told to thicken a liquid using a thickening product. Use as little water as possible when brushing the person's teeth or cleaning their mouth. Provide oral care before and after meals. Use adaptive devices as told by the health care provider. These may include cut-out cups or other utensils. Crush pills and put them in soft food, such as pudding or ice cream. Some pills should not be crushed. Check with the health care provider before crushing any medicine. If the person is choking on food or an object, do abdominal thrusts.  Contact a health care provider if: The person has a feeding tube, and the feeding tube residual amount is too high. The person tries to avoid food or water. They may refuse to eat, drink, or be fed, or they may eat less than normal. The person may have aspirated food or liquid. The person shows warning signs of aspiration. These include choking or coughing when they eat or drink. The person has symptoms of pneumonia. These may include: Coughing a lot or coughing up mucus with a bad smell or blood in it. A long-lasting (chronic) cough. Shortness of breath or wheezing. Chest or back pain. Sweating, fever, or chills. Get help right away if: The person has trouble breathing or starts to breathe fast. The person is breathing very slowly or stops breathing. The person cannot stop choking. The person turns blue, faints, or seems confused. These symptoms may be an emergency. Get help right away. Call 911. Do not wait to see if the symptoms will go away. This  information is not intended to replace advice given to you by your health care provider. Make sure you discuss any questions you have with your health care provider. Document Revised: 12/30/2021 Document Reviewed: 12/30/2021 Elsevier Patient Education  2023 [employer].

## 2022-11-08 NOTE — Assessment & Plan Note (Signed)
-   Hx recurrent aspiration pneumonia. Treated in March and April 2024 with antibiotics. She had influenza A two weeks ago. She has a chronic non-productive cough which has been worse last 4 weeks. CXR on 11/02/22 showed opacification left hemidiaphragm with mild patchy density compatible with left basilar pneumonia or atelectasis. Recommend getting CT chest to better evaluate left lung base. Recommend patient take mucinex twice daily and use flutter valve three times daily. Patient education given on aspiration precautions. If able would get sputum culture in the future.

## 2022-11-08 NOTE — Telephone Encounter (Signed)
Rescheduled to 4/16 at 7:00pm.  Called Amy and gave her new appt info.  Nothing further needed.

## 2022-11-08 NOTE — Telephone Encounter (Signed)
Pt's sister called the office about her upcoming CT which is currently scheduled for 4/30. States when she got pt back to the nursing home after her appt with our office, pt was eating food and choked after eating.   Due to this, she is wanting to know if pt's CT can be moved up sooner than 4/30. Concern about why the CT has been scheduled is due to aspiration pneumonia.  Routing to [facility] for them to see if this can be pushed up sooner.

## 2022-11-08 NOTE — Assessment & Plan Note (Signed)
-   Patient has moderate hiatal hernia. Not surgical candidate. Encourage weight loss and continue PPI.

## 2022-11-16 ENCOUNTER — Ambulatory Visit (HOSPITAL_BASED_OUTPATIENT_CLINIC_OR_DEPARTMENT_OTHER)
Admission: RE | Admit: 2022-11-16 | Discharge: 2022-11-16 | Disposition: A | Discharge status: Home / Self Care | Payer: Medicare Other | Source: Ambulatory Visit | Attending: Primary Care | Admitting: Primary Care

## 2022-11-16 DIAGNOSIS — J690 Pneumonitis due to inhalation of food and vomit: Secondary | ICD-10-CM

## 2022-11-16 NOTE — Progress Notes (Signed)
Please let patient know labs were normal. We will let her know results from CT scan later this week

## 2022-11-22 NOTE — Progress Notes (Signed)
Please let patient know CT of her chest showed no acute findings of lungs.  She has a moderate to large hiatal hernia, this could be causing reflux that could be contributing to cough, if not set up with GI please refer. Tiny bilateral pulmonary nodules measuring up to 5 mm. Patient is low risk, never smoked. No follow-up needed. Mild coronary artery disease, follow-up with CPAP or cardiology. Continue statin.

## 2022-11-28 ENCOUNTER — Encounter (HOSPITAL_BASED_OUTPATIENT_CLINIC_OR_DEPARTMENT_OTHER): Payer: Self-pay | Admitting: Emergency Medicine

## 2022-11-28 ENCOUNTER — Emergency Department (HOSPITAL_BASED_OUTPATIENT_CLINIC_OR_DEPARTMENT_OTHER)
Admission: EM | Admit: 2022-11-28 | Discharge: 2022-11-28 | Disposition: A | Discharge status: Home / Self Care | Payer: Medicare Other | Attending: Emergency Medicine | Admitting: Emergency Medicine

## 2022-11-28 ENCOUNTER — Emergency Department (HOSPITAL_BASED_OUTPATIENT_CLINIC_OR_DEPARTMENT_OTHER): Payer: Medicare Other

## 2022-11-28 ENCOUNTER — Emergency Department (HOSPITAL_BASED_OUTPATIENT_CLINIC_OR_DEPARTMENT_OTHER): Payer: Medicare Other | Admitting: Radiology

## 2022-11-28 ENCOUNTER — Other Ambulatory Visit: Payer: Self-pay

## 2022-11-28 DIAGNOSIS — Y92129 Unspecified place in nursing home as the place of occurrence of the external cause: Secondary | ICD-10-CM | POA: Diagnosis not present

## 2022-11-28 DIAGNOSIS — G20A1 Parkinson's disease without dyskinesia, without mention of fluctuations: Secondary | ICD-10-CM | POA: Diagnosis not present

## 2022-11-28 DIAGNOSIS — W19XXXA Unspecified fall, initial encounter: Secondary | ICD-10-CM | POA: Insufficient documentation

## 2022-11-28 DIAGNOSIS — M79621 Pain in right upper arm: Secondary | ICD-10-CM | POA: Diagnosis not present

## 2022-11-28 DIAGNOSIS — Z7982 Long term (current) use of aspirin: Secondary | ICD-10-CM | POA: Diagnosis not present

## 2022-11-28 DIAGNOSIS — Z79899 Other long term (current) drug therapy: Secondary | ICD-10-CM | POA: Insufficient documentation

## 2022-11-28 DIAGNOSIS — S0083XA Contusion of other part of head, initial encounter: Secondary | ICD-10-CM | POA: Diagnosis not present

## 2022-11-28 DIAGNOSIS — I509 Heart failure, unspecified: Secondary | ICD-10-CM | POA: Diagnosis not present

## 2022-11-28 DIAGNOSIS — S0990XA Unspecified injury of head, initial encounter: Secondary | ICD-10-CM | POA: Diagnosis present

## 2022-11-28 LAB — COMPREHENSIVE METABOLIC PANEL
ALT: 9 U/L (ref 0–44)
AST: 15 U/L (ref 15–41)
Albumin: 4.3 g/dL (ref 3.5–5.0)
Alkaline Phosphatase: 101 U/L (ref 38–126)
Anion gap: 8 (ref 5–15)
BUN: 16 mg/dL (ref 8–23)
CO2: 31 mmol/L (ref 22–32)
Calcium: 9.1 mg/dL (ref 8.9–10.3)
Chloride: 100 mmol/L (ref 98–111)
Creatinine, Ser: 0.98 mg/dL (ref 0.44–1.00)
GFR, Estimated: >60 mL/min (ref >60)
Glucose, Bld: 97 mg/dL (ref 70–99)
Potassium: 3.5 mmol/L (ref 3.5–5.1)
Sodium: 139 mmol/L (ref 135–145)
Total Bilirubin: 0.4 mg/dL (ref 0.3–1.2)
Total Protein: 7 g/dL (ref 6.5–8.1)

## 2022-11-28 LAB — CBC WITH DIFFERENTIAL/PLATELET
Abs Immature Granulocytes: 0.02 10*3/uL (ref 0.00–0.07)
Basophils Absolute: 0 10*3/uL (ref 0.0–0.1)
Basophils Relative: 1 %
Eosinophils Absolute: 0.1 10*3/uL (ref 0.0–0.5)
Eosinophils Relative: 2 %
HCT: 41.9 % (ref 36.0–46.0)
Hemoglobin: 13.6 g/dL (ref 12.0–15.0)
Immature Granulocytes: 0 %
Lymphocytes Relative: 16 %
Lymphs Abs: 0.8 10*3/uL (ref 0.7–4.0)
MCH: 31.1 pg (ref 26.0–34.0)
MCHC: 32.5 g/dL (ref 30.0–36.0)
MCV: 95.7 fL (ref 80.0–100.0)
Monocytes Absolute: 0.6 10*3/uL (ref 0.1–1.0)
Monocytes Relative: 12 %
Neutro Abs: 3.5 10*3/uL (ref 1.7–7.7)
Neutrophils Relative %: 69 %
Platelets: 241 10*3/uL (ref 150–400)
RBC: 4.38 MIL/uL (ref 3.87–5.11)
RDW: 13.2 % (ref 11.5–15.5)
WBC: 5.1 10*3/uL (ref 4.0–10.5)
nRBC: 0 % (ref 0.0–0.2)

## 2022-11-28 LAB — URINALYSIS, ROUTINE W REFLEX MICROSCOPIC
Bilirubin Urine: NEGATIVE
Glucose, UA: NEGATIVE mg/dL
Hgb urine dipstick: NEGATIVE
Ketones, ur: NEGATIVE mg/dL
Leukocytes,Ua: NEGATIVE
Nitrite: NEGATIVE
Protein, ur: NEGATIVE mg/dL
Specific Gravity, Urine: 1.019 (ref 1.005–1.030)
pH: 6 (ref 5.0–8.0)

## 2022-11-28 NOTE — ED Notes (Signed)
Pt assisted by this RN to bedside commode to obtain urine sample. Pt defecated in bedpan, sample unable to be used. [place] DO made aware

## 2022-11-28 NOTE — ED Triage Notes (Signed)
Pt via pov from [facility] with daughter. She had a witnessed fall on Friday and again on Saturday. No LOC per facility.  The facility reported to daughter that she has been confused and sleeping a lot since Friday. Daughter also reports that pt has had multiple fx to her right humerus - has metal plate - and now has limited range of motion in her right arm. Pt alert & oriented to self; disoriented to time; oriented to place & situation. NAD noted.

## 2022-11-28 NOTE — ED Notes (Signed)
RN reviewed discharge instructions with pt. Pt verbalized understanding and had no further questions. VSS upon discharge. Daughter to transport pt back to [facility]

## 2022-11-28 NOTE — ED Provider Notes (Signed)
[place] [facility]GENC[facility] DEPARTMENT AT [facility] Provider Note   CSN: [id number] Arrival date & time: 11/28/22  1858     History  Chief Complaint  Patient presents with   Quentin Cornwall LIV RALLIS is a 72 y.o. female.  Patient here with right arm pain after fall couple days ago.  History of right humerus fracture in the past.  History of CHF, falls, Parkinson's, high cholesterol.  Patient overall is feeling well otherwise.  Sounds like she fell from a chair twice.  She denies any chest pain or shortness of breath or other extremity pain.  She has been using walker and wheelchair now these last 2 days.  Last fall was yesterday.  She has bruising to her forehead.  She denies any numbness or tingling.  No weakness or vision changes.  No infectious symptoms.  No cough no sputum production.  No pain with urination.  The history is provided by the patient and a relative.       Home Medications Prior to Admission medications   Medication Sig Start Date End Date Taking? Authorizing Provider  Acetaminophen 500 MG capsule Take 1,000 mg by mouth in the morning, at noon, and at bedtime.    [provider]  albuterol (VENTOLIN HFA) 108 (90 Base) MCG/ACT inhaler Inhale into the lungs 2 (two) times daily as needed for wheezing or shortness of breath.    [provider]  allopurinol (ZYLOPRIM) 100 MG tablet Take 100 mg by mouth daily. 05/18/21   [provider]  aspirin 81 MG EC tablet Take 81 mg by mouth daily. Swallow whole.    [provider]  benzonatate (TESSALON) 100 MG capsule Take 1 capsule (100 mg total) by mouth 3 (three) times daily as needed for cough. 11/08/22   Glenford Bayley, NP  cetirizine (ZYRTEC) 10 MG tablet Take 10 mg by mouth daily. 10/13/22   [provider]  dextromethorphan (DELSYM) 30 MG/5ML liquid Take 60 mg by mouth every 12 (twelve) hours as needed for cough.    [provider]  Dextromethorphan-guaiFENesin 10-100  MG/5ML liquid Take 10 mLs by mouth daily as needed (cough). Patient not taking: Reported on 11/08/2022    [provider]  diclofenac Sodium (VOLTAREN) 1 % GEL Apply 1 application  topically in the morning, at noon, and at bedtime.    [provider]  docusate sodium (COLACE) 100 MG capsule Take 100 mg by mouth daily. Patient not taking: Reported on 11/08/2022    [provider]  escitalopram (LEXAPRO) 20 MG tablet Take 1 tablet (20 mg total) by mouth daily. 02/22/20   [name] Elaiza-Margaret, FNP  ferrous sulfate 325 (65 FE) MG tablet Take 1 tablet (325 mg total) by mouth daily with breakfast. 02/22/20   [name], Faduma-Margaret, FNP  furosemide (LASIX) 20 MG tablet Take 20 mg by mouth daily. 05/25/21   [provider]  hydrochlorothiazide (MICROZIDE) 12.5 MG capsule Take 12.5 mg by mouth daily. 07/18/21   [provider]  hydrocortisone cream 1 % Apply 1 Application topically 3 (three) times daily.    [provider]  ipratropium-albuterol (DUONEB) 0.5-2.5 (3) MG/3ML SOLN Take 3 mLs by nebulization every 6 (six) hours as needed.    [provider]  levothyroxine (SYNTHROID) 100 MCG tablet Take 100 mcg by mouth daily before breakfast.    [provider]  LORazepam (ATIVAN) 0.5 MG tablet Take 0.5 mg by mouth at bedtime.    [provider]  lovastatin (MEVACOR) 40 MG tablet Take 1 tablet (40 mg total) by mouth at bedtime. 12/04/19   [name], Damia-Margaret, FNP  methocarbamol (ROBAXIN) 500 MG tablet Take 500 mg by mouth every 8 (eight) hours as needed for muscle spasms. 07/31/21   [provider]  metoprolol tartrate (LOPRESSOR) 25 MG tablet Take 12.5 mg by mouth 2 (two) times daily. 05/13/21   [provider]  mineral oil-hydrophilic petrolatum (AQUAPHOR) ointment Apply 1 application. topically in the morning, at noon, and at bedtime. Apply to buttocks and crease of buttocks for redness and irritation    [provider]  MUCINEX 600 MG 12 hr tablet Take 600 mg by mouth 2 (two) times daily. 06/22/21   [provider]  nystatin (MYCOSTATIN/NYSTOP) powder Apply 1 application. topically 3 (three) times daily. Under breasts and groin area Patient not taking: Reported on 11/08/2022 06/01/21   [provider]  OLANZapine (ZYPREXA) 2.5 MG tablet Take 2.5 mg by mouth daily with supper.    [provider]  oxybutynin (DITROPAN-XL) 5 MG 24 hr tablet Take 5 mg by mouth at bedtime.    [provider]  pantoprazole (PROTONIX) 40 MG tablet TAKE 1 TABLET DAILY 07/21/20   [name], Landrie-Margaret, FNP  polyethylene glycol (MIRALAX / GLYCOLAX) 17 g packet Take 17 g by mouth daily as needed for moderate constipation. Patient not taking: Reported on 11/08/2022    [provider]  polyvinyl alcohol (LIQUIFILM TEARS) 1.4 % ophthalmic solution Place 1 drop into both eyes as needed for dry eyes. Patient not taking: Reported on 11/08/2022    [provider]  potassium chloride 20 MEQ/15ML (10%) SOLN Take 20 mEq by mouth daily. 08/25/21   [provider]  rivaroxaban (XARELTO) 20 MG TABS tablet Take 20 mg by mouth daily. 04/30/21   [provider]  traMADol (ULTRAM) 50 MG tablet Take 50 mg by mouth every 6 (six) hours as needed for moderate pain.    [provider]  vitamin C (ASCORBIC ACID) 250 MG tablet Take 250 mg by mouth daily.    [provider]      Allergies    Keflex [cephalexin], Sulfamethoxazole, and Doxycycline    Review of Systems   Review of Systems  Physical Exam Updated Vital Signs BP 120/63   Pulse 63   Temp 98.2 F (36.8 C) (Oral)   Resp 18   Ht 5' 5.5" (1.664 m)   Wt 122 kg   SpO2 96%   BMI 44.08 kg/m  Physical Exam Vitals and nursing note reviewed.  Constitutional:      General: She is not in acute distress.    Appearance: She is well-developed. She is not ill-appearing.  HENT:     Head:     Comments:  Bruising to the forehead    Nose: Nose normal.     Mouth/Throat:     Mouth: Mucous membranes are moist.  Eyes:     Extraocular Movements: Extraocular movements intact.     Conjunctiva/sclera: Conjunctivae normal.     Pupils: Pupils are equal, round, and reactive to light.  Cardiovascular:     Rate and Rhythm: Normal rate and regular rhythm.     Pulses: Normal pulses.     Heart sounds: Normal heart sounds. No murmur heard. Pulmonary:     Effort: Pulmonary effort is normal. No respiratory distress.     Breath sounds: Normal breath sounds.  Abdominal:     Palpations: Abdomen is soft.  Tenderness: There is no abdominal tenderness.  Musculoskeletal:        General: Tenderness present.     Cervical back: Normal range of motion and neck supple. No tenderness.     Comments: Tenderness to the right humerus, no other bony tenderness on exam  Skin:    General: Skin is warm and dry.     Capillary Refill: Capillary refill takes less than 2 seconds.  Neurological:     General: No focal deficit present.     Mental Status: She is alert and oriented to person, place, and time.     Cranial Nerves: No cranial nerve deficit.     Sensory: No sensory deficit.     Motor: No weakness.     Coordination: Coordination normal.     ED Results / Procedures / Treatments   Labs (all labs ordered are listed, but only abnormal results are displayed) Labs Reviewed  CBC WITH DIFFERENTIAL/PLATELET  COMPREHENSIVE METABOLIC PANEL  URINALYSIS, ROUTINE W REFLEX MICROSCOPIC    EKG EKG Interpretation  Date/Time:  Sunday November 28 2022 20:36:01 EDT Ventricular Rate:  73 PR Interval:  179 QRS Duration: 88 QT Interval:  421 QTC Calculation: 464 R Axis:   20 Text Interpretation: Sinus rhythm Low voltage, precordial leads Abnormal R-wave progression, early transition Borderline T abnormalities, anterior leads Confirmed by Virgina Norfolk ([id number]) on 11/28/2022 9:01:02 PM  Radiology DG Humerus Right  Result  Date: 11/28/2022 CLINICAL DATA:  Fall EXAM: RIGHT HUMERUS - 2+ VIEW COMPARISON:  None Available. FINDINGS: Right humeral intramedullary nail present. There is no evidence for acute fracture or dislocation. Chronic deformity of the mid right humerus present with some areas of cortical thickening centrally. There is no evidence for dislocation. The soft tissues are within normal limits. IMPRESSION: 1. No acute fracture or dislocation. 2. Chronic deformity of the mid right humerus. Electronically Signed   By: Darliss Cheney M.D.   On: 11/28/2022 21:46   DG Pelvis 1-2 Views  Result Date: 11/28/2022 CLINICAL DATA:  Recent falls, initial encounter EXAM: PELVIS - 1 VIEW COMPARISON:  None Available. FINDINGS: Mild degenerative changes of the hip joints and lumbar spine are seen. No fracture or dislocation is noted. No soft tissue changes are seen. IMPRESSION: Degenerative change without acute abnormality. Electronically Signed   By: Alcide Clever M.D.   On: 11/28/2022 21:38   DG Chest 1 View  Result Date: 11/28/2022 CLINICAL DATA:  Fall EXAM: CHEST  1 VIEW COMPARISON:  Chest x-ray 03/24/2022 FINDINGS: Large hiatal hernia again seen. There is atelectasis in the left lung base. Cardiomediastinal silhouette within normal limits. No focal lung infiltrate, pleural effusion or pneumothorax. No acute fracture. IMPRESSION: 1. No acute cardiopulmonary disease. 2. Large hiatal hernia. Electronically Signed   By: Darliss Cheney M.D.   On: 11/28/2022 21:30   CT Head Wo Contrast  Result Date: 11/28/2022 CLINICAL DATA:  Multiple recent falls with headaches and neck pain, initial encounter EXAM: CT HEAD WITHOUT CONTRAST CT CERVICAL SPINE WITHOUT CONTRAST TECHNIQUE: Multidetector CT imaging of the head and cervical spine was performed following the standard protocol without intravenous contrast. Multiplanar CT image reconstructions of the cervical spine were also generated. RADIATION DOSE REDUCTION: This exam was performed  according to the departmental dose-optimization program which includes automated exposure control, adjustment of the mA and/or kV according to patient size and/or use of iterative reconstruction technique. COMPARISON:  05/11/2020 FINDINGS: CT HEAD FINDINGS Brain: No evidence of acute infarction, hemorrhage, hydrocephalus, extra-axial collection or mass lesion/mass  effect. Mild atrophic changes and chronic white matter ischemic changes are again seen. Vascular: No hyperdense vessel or unexpected calcification. Skull: Normal. Negative for fracture or focal lesion. Sinuses/Orbits: No acute finding. Other: None. CT CERVICAL SPINE FINDINGS Alignment: Straightening of the normal cervical lordosis is noted. Skull base and vertebrae: 7 cervical segments are well visualized. Vertebral body height is well maintained. Multilevel disc space narrowing with osteophytic change. No acute fracture or acute facet abnormality is noted. The odontoid is within normal limits. Soft tissues and spinal canal: Surrounding soft tissue structures are fall. Upper chest: Visualized lung apices are within normal limits. Other: None IMPRESSION: CT of the head: Chronic atrophic and ischemic changes without acute abnormality. CT of cervical spine: Degenerative change without acute abnormality. Electronically Signed   By: Alcide Clever M.D.   On: 11/28/2022 21:26   CT Cervical Spine Wo Contrast  Result Date: 11/28/2022 CLINICAL DATA:  Multiple recent falls with headaches and neck pain, initial encounter EXAM: CT HEAD WITHOUT CONTRAST CT CERVICAL SPINE WITHOUT CONTRAST TECHNIQUE: Multidetector CT imaging of the head and cervical spine was performed following the standard protocol without intravenous contrast. Multiplanar CT image reconstructions of the cervical spine were also generated. RADIATION DOSE REDUCTION: This exam was performed according to the departmental dose-optimization program which includes automated exposure control, adjustment of  the mA and/or kV according to patient size and/or use of iterative reconstruction technique. COMPARISON:  05/11/2020 FINDINGS: CT HEAD FINDINGS Brain: No evidence of acute infarction, hemorrhage, hydrocephalus, extra-axial collection or mass lesion/mass effect. Mild atrophic changes and chronic white matter ischemic changes are again seen. Vascular: No hyperdense vessel or unexpected calcification. Skull: Normal. Negative for fracture or focal lesion. Sinuses/Orbits: No acute finding. Other: None. CT CERVICAL SPINE FINDINGS Alignment: Straightening of the normal cervical lordosis is noted. Skull base and vertebrae: 7 cervical segments are well visualized. Vertebral body height is well maintained. Multilevel disc space narrowing with osteophytic change. No acute fracture or acute facet abnormality is noted. The odontoid is within normal limits. Soft tissues and spinal canal: Surrounding soft tissue structures are fall. Upper chest: Visualized lung apices are within normal limits. Other: None IMPRESSION: CT of the head: Chronic atrophic and ischemic changes without acute abnormality. CT of cervical spine: Degenerative change without acute abnormality. Electronically Signed   By: Alcide Clever M.D.   On: 11/28/2022 21:26    Procedures Procedures    Medications Ordered in ED Medications - No data to display  ED Course/ Medical Decision Making/ A&P                             Medical Decision Making Amount and/or Complexity of Data Reviewed Labs: ordered. Radiology: ordered.   Jearld Lesch Golab here with right arm pain after a fall couple days ago.  She fell Friday and Saturday.  She is not on blood thinner any longer.  Used to be on Xarelto.  She has history of mobility issues.  She uses a walker and wheelchair at times.  She had 2 falls off of a chair here with the past 2 days.  She denies any chest pain or shortness of breath.  She has had humerus fracture repaired in the past.  She denies any pain with  urination or cough or sputum production.  Neurologically she appears to be intact.  She is tender in the right humerus area.  No other tenderness elsewhere.  Discussed some bruising to her forehead  from the fall 2 days ago.  Family wanted to get her checked out because she is not moving the right arm as much.  Per my review and interpretation labs no significant anemia or electrolyte abnormality or kidney injury.  CT scan of the head and neck per radiology report are unremarkable.  X-rays per radiology report showed no acute fractures or malalignment.  Pneumothorax or pneumonia per my review and interpretation of chest x-ray.  Overall suspect may be a mild concussion.  Will have her follow-up with primary care doctor.  Discharged in good condition.  She seems to be doing clinically well.  This chart was dictated using voice recognition software.  Despite best efforts to proofread,  errors can occur which can change the documentation meaning.         Final Clinical Impression(s) / ED Diagnoses Final diagnoses:  Fall, initial encounter  Contusion of forehead, initial encounter    Rx / DC Orders ED Discharge Orders     None         Virgina Norfolk, DO 11/28/22 2220

## 2022-11-30 ENCOUNTER — Other Ambulatory Visit (HOSPITAL_BASED_OUTPATIENT_CLINIC_OR_DEPARTMENT_OTHER): Payer: Medicare Other

## 2022-12-07 ENCOUNTER — Ambulatory Visit: Payer: Medicare Other | Admitting: Pulmonary Disease

## 2022-12-08 ENCOUNTER — Emergency Department (HOSPITAL_COMMUNITY): Payer: Medicare Other

## 2022-12-08 ENCOUNTER — Encounter (HOSPITAL_COMMUNITY): Payer: Self-pay

## 2022-12-08 ENCOUNTER — Other Ambulatory Visit: Payer: Self-pay

## 2022-12-08 ENCOUNTER — Emergency Department (HOSPITAL_COMMUNITY)
Admission: EM | Admit: 2022-12-08 | Discharge: 2022-12-08 | Disposition: A | Discharge status: Home / Self Care | Payer: Medicare Other | Attending: Emergency Medicine | Admitting: Emergency Medicine

## 2022-12-08 DIAGNOSIS — G20C Parkinsonism, unspecified: Secondary | ICD-10-CM | POA: Insufficient documentation

## 2022-12-08 DIAGNOSIS — R410 Disorientation, unspecified: Secondary | ICD-10-CM | POA: Diagnosis present

## 2022-12-08 DIAGNOSIS — Z7901 Long term (current) use of anticoagulants: Secondary | ICD-10-CM | POA: Insufficient documentation

## 2022-12-08 DIAGNOSIS — R296 Repeated falls: Secondary | ICD-10-CM | POA: Insufficient documentation

## 2022-12-08 DIAGNOSIS — Z7982 Long term (current) use of aspirin: Secondary | ICD-10-CM | POA: Diagnosis not present

## 2022-12-08 DIAGNOSIS — R4182 Altered mental status, unspecified: Secondary | ICD-10-CM

## 2022-12-08 LAB — CBC WITH DIFFERENTIAL/PLATELET
Abs Immature Granulocytes: 0.05 10*3/uL (ref 0.00–0.07)
Basophils Absolute: 0 10*3/uL (ref 0.0–0.1)
Basophils Relative: 0 %
Eosinophils Absolute: 0.1 10*3/uL (ref 0.0–0.5)
Eosinophils Relative: 1 %
HCT: 44 % (ref 36.0–46.0)
Hemoglobin: 14 g/dL (ref 12.0–15.0)
Immature Granulocytes: 1 %
Lymphocytes Relative: 9 %
Lymphs Abs: 0.7 10*3/uL (ref 0.7–4.0)
MCH: 30.6 pg (ref 26.0–34.0)
MCHC: 31.8 g/dL (ref 30.0–36.0)
MCV: 96.1 fL (ref 80.0–100.0)
Monocytes Absolute: 0.6 10*3/uL (ref 0.1–1.0)
Monocytes Relative: 8 %
Neutro Abs: 6.5 10*3/uL (ref 1.7–7.7)
Neutrophils Relative %: 81 %
Platelets: 237 10*3/uL (ref 150–400)
RBC: 4.58 MIL/uL (ref 3.87–5.11)
RDW: 12.8 % (ref 11.5–15.5)
WBC: 7.9 10*3/uL (ref 4.0–10.5)
nRBC: 0 % (ref 0.0–0.2)

## 2022-12-08 LAB — COMPREHENSIVE METABOLIC PANEL
ALT: 16 U/L (ref 0–44)
AST: 20 U/L (ref 15–41)
Albumin: 3.9 g/dL (ref 3.5–5.0)
Alkaline Phosphatase: 93 U/L (ref 38–126)
Anion gap: 10 (ref 5–15)
BUN: 15 mg/dL (ref 8–23)
CO2: 27 mmol/L (ref 22–32)
Calcium: 9 mg/dL (ref 8.9–10.3)
Chloride: 99 mmol/L (ref 98–111)
Creatinine, Ser: 1.02 mg/dL — ABNORMAL HIGH (ref 0.44–1.00)
GFR, Estimated: 58 mL/min — ABNORMAL LOW (ref >60)
Glucose, Bld: 107 mg/dL — ABNORMAL HIGH (ref 70–99)
Potassium: 4.1 mmol/L (ref 3.5–5.1)
Sodium: 136 mmol/L (ref 135–145)
Total Bilirubin: 0.5 mg/dL (ref 0.3–1.2)
Total Protein: 7.1 g/dL (ref 6.5–8.1)

## 2022-12-08 LAB — URINALYSIS, ROUTINE W REFLEX MICROSCOPIC
Bilirubin Urine: NEGATIVE
Glucose, UA: NEGATIVE mg/dL
Hgb urine dipstick: NEGATIVE
Ketones, ur: NEGATIVE mg/dL
Leukocytes,Ua: NEGATIVE
Nitrite: NEGATIVE
Protein, ur: NEGATIVE mg/dL
Specific Gravity, Urine: 1.012 (ref 1.005–1.030)
pH: 6 (ref 5.0–8.0)

## 2022-12-08 NOTE — ED Triage Notes (Signed)
Patient brought in by EMS from [facility] due to multiple falls and confusion. Per report, patient fell a few weeks ago and sustained a concussion. Since going back to residence staff has noticed that patient seems "not as sharp" as she was prior to her hospital stay. Also notes behavioral changes and leaning to the left side. Pt able to follow commands and can answer some questions appropriately.

## 2022-12-08 NOTE — ED Provider Notes (Signed)
[place] EMERGENCY DEPARTMENT AT [facility] Provider Note   CSN: [id number] Arrival date & time: 12/08/22  1058     History  Chief Complaint  Patient presents with   Altered Mental Status   Fall    Renee Davidson is a 72 y.o. female.  72 year old female presents with increasing falls and confusion.  Patient seen 2 weeks ago for similar symptoms.  Patient had been on Xarelto but not anymore.  Has a history of gait instability and does use cane and walker for mobility.  Also has history of Parkinson's.  Patient himself denies any pain at this time.  According to EMS, staff at her facility states patient is not as sharp as she normally is.  Possible leaning to her left side.  EMS called and patient transported here       Home Medications Prior to Admission medications   Medication Sig Start Date End Date Taking? Authorizing Provider  Acetaminophen 500 MG capsule Take 1,000 mg by mouth in the morning, at noon, and at bedtime.    [provider]  albuterol (VENTOLIN HFA) 108 (90 Base) MCG/ACT inhaler Inhale into the lungs 2 (two) times daily as needed for wheezing or shortness of breath.    [provider]  allopurinol (ZYLOPRIM) 100 MG tablet Take 100 mg by mouth daily. 05/18/21   [provider]  aspirin 81 MG EC tablet Take 81 mg by mouth daily. Swallow whole.    [provider]  benzonatate (TESSALON) 100 MG capsule Take 1 capsule (100 mg total) by mouth 3 (three) times daily as needed for cough. 11/08/22   Glenford Bayley, NP  cetirizine (ZYRTEC) 10 MG tablet Take 10 mg by mouth daily. 10/13/22   [provider]  dextromethorphan (DELSYM) 30 MG/5ML liquid Take 60 mg by mouth every 12 (twelve) hours as needed for cough.    [provider]  Dextromethorphan-guaiFENesin 10-100 MG/5ML liquid Take 10 mLs by mouth daily as needed (cough). Patient not taking: Reported on 11/08/2022    [provider]  diclofenac  Sodium (VOLTAREN) 1 % GEL Apply 1 application  topically in the morning, at noon, and at bedtime.    [provider]  docusate sodium (COLACE) 100 MG capsule Take 100 mg by mouth daily. Patient not taking: Reported on 11/08/2022    [provider]  escitalopram (LEXAPRO) 20 MG tablet Take 1 tablet (20 mg total) by mouth daily. 02/22/20   [name] Sharyl-Margaret, FNP  ferrous sulfate 325 (65 FE) MG tablet Take 1 tablet (325 mg total) by mouth daily with breakfast. 02/22/20   [name], Trena-Margaret, FNP  furosemide (LASIX) 20 MG tablet Take 20 mg by mouth daily. 05/25/21   [provider]  hydrochlorothiazide (MICROZIDE) 12.5 MG capsule Take 12.5 mg by mouth daily. 07/18/21   [provider]  hydrocortisone cream 1 % Apply 1 Application topically 3 (three) times daily.    [provider]  ipratropium-albuterol (DUONEB) 0.5-2.5 (3) MG/3ML SOLN Take 3 mLs by nebulization every 6 (six) hours as needed.    [provider]  levothyroxine (SYNTHROID) 100 MCG tablet Take 100 mcg by mouth daily before breakfast.    [provider]  LORazepam (ATIVAN) 0.5 MG tablet Take 0.5 mg by mouth at bedtime.    [provider]  lovastatin (MEVACOR) 40 MG tablet Take 1 tablet (40 mg total) by mouth at bedtime. 12/04/19   [name] Jacklin-Margaret, FNP  methocarbamol (ROBAXIN) 500 MG tablet Take  500 mg by mouth every 8 (eight) hours as needed for muscle spasms. 07/31/21   [provider]  metoprolol tartrate (LOPRESSOR) 25 MG tablet Take 12.5 mg by mouth 2 (two) times daily. 05/13/21   [provider]  mineral oil-hydrophilic petrolatum (AQUAPHOR) ointment Apply 1 application. topically in the morning, at noon, and at bedtime. Apply to buttocks and crease of buttocks for redness and irritation    [provider]  MUCINEX 600 MG 12 hr tablet Take 600 mg by mouth 2 (two) times daily. 06/22/21   [provider]  nystatin  (MYCOSTATIN/NYSTOP) powder Apply 1 application. topically 3 (three) times daily. Under breasts and groin area Patient not taking: Reported on 11/08/2022 06/01/21   [provider]  OLANZapine (ZYPREXA) 2.5 MG tablet Take 2.5 mg by mouth daily with supper.    [provider]  oxybutynin (DITROPAN-XL) 5 MG 24 hr tablet Take 5 mg by mouth at bedtime.    [provider]  pantoprazole (PROTONIX) 40 MG tablet TAKE 1 TABLET DAILY 07/21/20   [name], Isatu-Margaret, FNP  polyethylene glycol (MIRALAX / GLYCOLAX) 17 g packet Take 17 g by mouth daily as needed for moderate constipation. Patient not taking: Reported on 11/08/2022    [provider]  polyvinyl alcohol (LIQUIFILM TEARS) 1.4 % ophthalmic solution Place 1 drop into both eyes as needed for dry eyes. Patient not taking: Reported on 11/08/2022    [provider]  potassium chloride 20 MEQ/15ML (10%) SOLN Take 20 mEq by mouth daily. 08/25/21   [provider]  rivaroxaban (XARELTO) 20 MG TABS tablet Take 20 mg by mouth daily. 04/30/21   [provider]  traMADol (ULTRAM) 50 MG tablet Take 50 mg by mouth every 6 (six) hours as needed for moderate pain.    [provider]  vitamin C (ASCORBIC ACID) 250 MG tablet Take 250 mg by mouth daily.    [provider]      Allergies    Keflex [cephalexin], Sulfamethoxazole, and Doxycycline    Review of Systems   Review of Systems  All other systems reviewed and are negative.   Physical Exam Updated Vital Signs BP (!) 140/89 (BP Location: Left Arm)   Pulse 81   Temp (!) 97.4 F (36.3 C) (Oral)   Resp (!) 21   Ht 1.664 m (5' 5.5")   Wt 122 kg   SpO2 93%   BMI 44.08 kg/m  Physical Exam Vitals and nursing note reviewed.  Constitutional:      General: She is not in acute distress.    Appearance: Normal appearance. She is well-developed. She is not toxic-appearing.  HENT:     Head: Normocephalic and atraumatic.  Eyes:      General: Lids are normal.     Conjunctiva/sclera: Conjunctivae normal.     Pupils: Pupils are equal, round, and reactive to light.  Neck:     Thyroid: No thyroid mass.     Trachea: No tracheal deviation.  Cardiovascular:     Rate and Rhythm: Normal rate and regular rhythm.     Heart sounds: Normal heart sounds. No murmur heard.    No gallop.  Pulmonary:     Effort: Pulmonary effort is normal. No respiratory distress.     Breath sounds: Normal breath sounds. No stridor. No decreased breath sounds, wheezing, rhonchi or rales.  Abdominal:     General: There is no distension.     Palpations: Abdomen is soft.  Tenderness: There is no abdominal tenderness. There is no rebound.  Musculoskeletal:        General: No tenderness. Normal range of motion.     Cervical back: Normal range of motion and neck supple.  Skin:    General: Skin is warm and dry.     Findings: No abrasion or rash.  Neurological:     General: No focal deficit present.     Mental Status: She is alert and oriented to person, place, and time. Mental status is at baseline.     GCS: GCS eye subscore is 4. GCS verbal subscore is 5. GCS motor subscore is 6.     Cranial Nerves: No cranial nerve deficit.     Sensory: No sensory deficit.     Motor: Motor function is intact.  Psychiatric:        Attention and Perception: Attention normal.        Speech: Speech normal.        Behavior: Behavior normal.     ED Results / Procedures / Treatments   Labs (all labs ordered are listed, but only abnormal results are displayed) Labs Reviewed - No data to display  EKG None  Radiology No results found.  Procedures Procedures    Medications Ordered in ED Medications - No data to display  ED Course/ Medical Decision Making/ A&P                             Medical Decision Making Amount and/or Complexity of Data Reviewed Labs: ordered. Radiology: ordered.   Patient had a CT of the head here which was negative for  acute findings per my interpretation.  Urinalysis and labs without acute findings.  Daughter at bedside and patient is at her baseline.  Daughter states that patient symptoms seem to wax and wane.  Do not feel that patient is having TIAs based on description.  No further imaging needed here.  Patient's medications have not been changed but that could be an etiology of polypharmacy.  Will discharge        Final Clinical Impression(s) / ED Diagnoses Final diagnoses:  None    Rx / DC Orders ED Discharge Orders     None         Lorre Nick, MD 12/08/22 1352

## 2022-12-08 NOTE — ED Notes (Signed)
This NA attempted an In and Out Cath, but was unsuccessful at urine return. RN made aware.

## 2022-12-16 ENCOUNTER — Ambulatory Visit (INDEPENDENT_AMBULATORY_CARE_PROVIDER_SITE_OTHER): Payer: Medicare Other | Admitting: Neurology

## 2022-12-16 VITALS — BP 120/72 | HR 87 | Ht 65.0 in | Wt 262.5 lb

## 2022-12-16 DIAGNOSIS — G959 Disease of spinal cord, unspecified: Secondary | ICD-10-CM

## 2022-12-16 DIAGNOSIS — R296 Repeated falls: Secondary | ICD-10-CM | POA: Diagnosis not present

## 2022-12-16 DIAGNOSIS — R269 Unspecified abnormalities of gait and mobility: Secondary | ICD-10-CM

## 2022-12-16 NOTE — Progress Notes (Signed)
[facility]  PATIENT: Renee Davidson DOB: 27-Sep-1950  REFERRING CLINICIAN: [name], MD HISTORY FROM: Patient and sister  REASON FOR VISIT: Multiple falls, need clearance for surgery    HISTORICAL  CHIEF COMPLAINT:  Chief Complaint  Patient presents with   Follow-up    Rm 13. Patient with sister, AMY. Sister reports improvement, but has fallen twice and both scans each time were clear. Patient reports feeling okay, but sometimes gets dizzy but states it comes and goes. EKG showed prolonged QT intervals according to sister. She sees cardio next week.     INTERVAL HISTORY 12/16/2022 Patient presents today for follow-up, she is accompanied by her sister.  Last visit was in October 2022, at that time we sent her to physical therapy which she completed.  She reside in a nursing facility and still doing physical therapy.  Patient still experiences fall.  She does have a walker but falls remain.  She denies any warning signs prior to the fall, no dizziness, no lightheadedness.  Sister reports last fall, it seemed like her knees just gave out.  Of note, patient was recently found to have prolong QT, her SSRI dose was decrease and patient is set to see cardiology next week.    HISTORY OF PRESENT ILLNESS:  This is a 72 year old woman with multiple medical conditions including hypertension, hypothyroidism, hyperlipidemia, generalized anxiety, depression, GERD, recent fracture of the shaft of the right humerus and recent fracture of the left fibula s/p falls who is presenting with multiple falls.  Per sister patient fell and broke her arm on January 10, she had a metal plate inserted but the arm is not healing properly and they are in the process of replacing the metal plate.  2 months ago she had another fall and broken her left fibula.  It is unclear why she is falling, she has seen cardiologist, pulmonologist and they could not find etiology for fall.  Patient denies any  trip and fall, denies feeling lightheaded or dizzy prior to fall she reported that she just hit the ground not knowing why.  Denies any abnormal movement denies any confusion afterward, she also denies any previous history of seizures. For her upcoming right arm surgery she was told that she needs neurological clearance due to the fact that she had a bad reaction to anesthesia after for surgery.  The bad reaction was described as confusion, difficult to arouse status post anesthesia and taking a few hours for patient to come back to her baseline, no abnormal movement described.    OTHER MEDICAL CONDITIONS: hypertension, hypothyroidism, hyperlipidemia, generalized anxiety, depression, GERD, recent fracture of the shaft of the right humerus and recent fracture of the left fibula   REVIEW OF SYSTEMS: Full 14 system review of systems performed and negative with exception of: As noted in the HPI  ALLERGIES: Allergies  Allergen Reactions   Keflex [Cephalexin] Other (See Comments)    Dizzy, lightheaded, falls   Sulfamethoxazole Other (See Comments)   Doxycycline Rash    HOME MEDICATIONS: Outpatient Medications Prior to Visit  Medication Sig Dispense Refill   Acetaminophen 500 MG capsule Take 1,000 mg by mouth in the morning, at noon, and at bedtime.     albuterol (VENTOLIN HFA) 108 (90 Base) MCG/ACT inhaler Inhale into the lungs 2 (two) times daily as needed for wheezing or shortness of breath.     allopurinol (ZYLOPRIM) 100 MG tablet Take 100 mg by mouth daily.     aspirin  81 MG EC tablet Take 81 mg by mouth daily. Swallow whole.     benzonatate (TESSALON) 100 MG capsule Take 1 capsule (100 mg total) by mouth 3 (three) times daily as needed for cough. 45 capsule 0   cetirizine (ZYRTEC) 10 MG tablet Take 10 mg by mouth daily.     dextromethorphan (DELSYM) 30 MG/5ML liquid Take 60 mg by mouth every 12 (twelve) hours as needed for cough.     Dextromethorphan-guaiFENesin 10-100 MG/5ML liquid Take  10 mLs by mouth daily as needed (cough).     diclofenac Sodium (VOLTAREN) 1 % GEL Apply 1 application  topically in the morning, at noon, and at bedtime.     docusate sodium (COLACE) 100 MG capsule Take 100 mg by mouth daily.     escitalopram (LEXAPRO) 20 MG tablet Take 1 tablet (20 mg total) by mouth daily. 90 tablet 1   ferrous sulfate 325 (65 FE) MG tablet Take 1 tablet (325 mg total) by mouth daily with breakfast. 90 tablet 1   furosemide (LASIX) 20 MG tablet Take 20 mg by mouth daily.     hydrochlorothiazide (MICROZIDE) 12.5 MG capsule Take 12.5 mg by mouth daily.     hydrocortisone cream 1 % Apply 1 Application topically 3 (three) times daily.     ipratropium-albuterol (DUONEB) 0.5-2.5 (3) MG/3ML SOLN Take 3 mLs by nebulization every 6 (six) hours as needed.     levothyroxine (SYNTHROID) 100 MCG tablet Take 100 mcg by mouth daily before breakfast.     LORazepam (ATIVAN) 0.5 MG tablet Take 0.5 mg by mouth at bedtime.     lovastatin (MEVACOR) 40 MG tablet Take 1 tablet (40 mg total) by mouth at bedtime. 90 tablet 1   methocarbamol (ROBAXIN) 500 MG tablet Take 500 mg by mouth every 8 (eight) hours as needed for muscle spasms.     metoprolol tartrate (LOPRESSOR) 25 MG tablet Take 12.5 mg by mouth 2 (two) times daily.     mineral oil-hydrophilic petrolatum (AQUAPHOR) ointment Apply 1 application. topically in the morning, at noon, and at bedtime. Apply to buttocks and crease of buttocks for redness and irritation     MUCINEX 600 MG 12 hr tablet Take 600 mg by mouth 2 (two) times daily.     nystatin (MYCOSTATIN/NYSTOP) powder Apply 1 application  topically 3 (three) times daily. Under breasts and groin area     OLANZapine (ZYPREXA) 2.5 MG tablet Take 2.5 mg by mouth daily with supper.     oxybutynin (DITROPAN-XL) 5 MG 24 hr tablet Take 5 mg by mouth at bedtime.     pantoprazole (PROTONIX) 40 MG tablet TAKE 1 TABLET DAILY 90 tablet 0   polyethylene glycol (MIRALAX / GLYCOLAX) 17 g packet Take 17 g  by mouth daily as needed for moderate constipation.     polyvinyl alcohol (LIQUIFILM TEARS) 1.4 % ophthalmic solution Place 1 drop into both eyes as needed for dry eyes.     potassium chloride 20 MEQ/15ML (10%) SOLN Take 20 mEq by mouth daily.     rivaroxaban (XARELTO) 20 MG TABS tablet Take 20 mg by mouth daily.     traMADol (ULTRAM) 50 MG tablet Take 50 mg by mouth every 6 (six) hours as needed for moderate pain.     vitamin C (ASCORBIC ACID) 250 MG tablet Take 250 mg by mouth daily.     No facility-administered medications prior to visit.    PAST MEDICAL HISTORY: Past Medical History:  Diagnosis Date  Anxiety    Cancer (HCC)    malignant thyroid   CHF (congestive heart failure) (HCC)    hx of 09/2020 per note   Conversion disorder with seizures or convulsions    Depression    Diverticulosis    DVT (deep venous thrombosis) (HCC)    right   Dyspnea    Dysrhythmia    hx of SVT- 01/14/21 per note   Essential hypertension    Fracture of right humerus    GERD (gastroesophageal reflux disease)    Hiatal hernia    Hiatal hernia    Humerus fracture    right   Hx of falling    Hyperlipidemia    Hypothyroidism    Mental developmental delay    OCD (obsessive compulsive disorder)    Osteoporosis    Parkinson disease    Parkinson's disease    Peptic ulcer    Pneumonia    hx of pneumonal several times per sister at preop   Thyroid disease    Thyroid nodule     PAST SURGICAL HISTORY: Past Surgical History:  Procedure Laterality Date   BIOPSY  11/02/2019   Procedure: BIOPSY;  Surgeon: Iva Boop, MD;  Location: [facility];  Service: Endoscopy;;   COLONOSCOPY WITH PROPOFOL N/A 11/02/2019   Procedure: COLONOSCOPY WITH PROPOFOL;  Surgeon: Iva Boop, MD;  Location: [facility];  Service: Endoscopy;  Laterality: N/A;   ESOPHAGOGASTRODUODENOSCOPY (EGD) WITH PROPOFOL N/A 11/01/2019   Procedure: ESOPHAGOGASTRODUODENOSCOPY (EGD) WITH PROPOFOL;  Surgeon: Iva Boop,  MD;  Location: [facility];  Service: Endoscopy;  Laterality: N/A;   HUMERUS FRACTURE SURGERY Right 08/13/2020   THYROID LOBECTOMY Right 12/21/2021   Procedure: RIGHT THYROID LOBECTOMY;  Surgeon: Darnell Level, MD;  Location: [facility];  Service: General;  Laterality: Right;   TONSILLECTOMY      FAMILY HISTORY: Family History  Problem Relation Age of Onset   GI problems Mother    Hypertension Mother    Hyperlipidemia Mother    Heart disease Mother    Diabetes Father    Hyperlipidemia Father    Hypertension Father    Atrial fibrillation Father    Heart attack Father    Osteoporosis Father    Colon polyps Father    Hyperlipidemia Sister    Dysphagia Sister    Heart disease Maternal Grandfather    Colon cancer Maternal Grandfather    Diabetes Paternal Grandmother    Heart disease Paternal Grandmother     SOCIAL HISTORY: Social History   Socioeconomic History   Marital status: Single    Spouse name: Not on file   Number of children: 0   Years of education: 12   Highest education level: 12th grade  Occupational History   Occupation: retired    Comment: Public librarian  Tobacco Use   Smoking status: Never   Smokeless tobacco: Never  Vaping Use   Vaping Use: Never used  Substance and Sexual Activity   Alcohol use: No   Drug use: No   Sexual activity: Never  Other Topics Concern   Not on file  Social History Narrative   Not on file   Social Determinants of Health   Financial Resource Strain: Low Risk  (07/04/2017)   Overall Financial Resource Strain (CARDIA)    Difficulty of Paying Living Expenses: Not hard at all  Food Insecurity: No Food Insecurity (07/04/2017)   Hunger Vital Sign    Worried About Running Out of Food in the Last Year: Never true  Ran Out of Food in the Last Year: Never true  Transportation Needs: No Transportation Needs (07/04/2017)   PRAPARE - Administrator, Civil Service (Medical): No    Lack of Transportation (Non-Medical): No   Physical Activity: Insufficiently Active (01/23/2018)   Exercise Vital Sign    Days of Exercise per Week: 3 days    Minutes of Exercise per Session: 30 min  Stress: No Stress Concern Present (07/04/2017)   [employer] of Occupational Health - Occupational Stress Questionnaire    Feeling of Stress : Not at all  Social Connections: Moderately Integrated (07/04/2017)   Social Connection and Isolation Panel [NHANES]    Frequency of Communication with Friends and Family: More than three times a week    Frequency of Social Gatherings with Friends and Family: More than three times a week    Attends Religious Services: More than 4 times per year    Active Member of [employer] or Organizations: Yes    Attends Banker Meetings: More than 4 times per year    Marital Status: Never married  Intimate Partner Violence: Not At Risk (01/23/2018)   Humiliation, Afraid, Rape, and Kick questionnaire    Fear of Current or Ex-Partner: No    Emotionally Abused: No    Physically Abused: No    Sexually Abused: No     PHYSICAL EXAM  GENERAL EXAM/CONSTITUTIONAL: Vitals:  Vitals:   12/16/22 1542  BP: 120/72  Pulse: 87  Weight: 262 lb 8 oz (119.1 kg)  Height: 5\' 5"  (1.651 m)    Body mass index is 43.68 kg/m. Wt Readings from Last 3 Encounters:  12/16/22 262 lb 8 oz (119.1 kg)  12/08/22 268 lb 15.4 oz (122 kg)  11/28/22 268 lb 15.4 oz (122 kg)   Patient is in no distress; well developed, nourished and groomed; neck is supple  MUSCULOSKELETAL: Gait, strength, tone, movements noted in Neurologic exam below  NEUROLOGIC: MENTAL STATUS:     07/04/2017    4:15 PM 06/30/2016   11:49 AM 05/19/2015   12:09 PM  MMSE - Mini Mental State Exam  Orientation to time 2 4 2   Orientation to Place 2 4 1   Registration 3 3 3   Attention/ Calculation 3 4 4   Recall 1 2 0  Language- name 2 objects 2 2 2   Language- repeat 1 1 1   Language- follow 3 step command 3 3 3   Language- read & follow  direction 1 1 1   Write a sentence 1 1 1   Copy design 1 0 0  Total score 20 25 18     CRANIAL NERVE:  2nd, 3rd, 4th, 6th - visual fields full to confrontation, extraocular muscles intact, no nystagmus 5th - facial sensation symmetric 7th - facial strength symmetric 8th - hearing intact 9th - palate elevates symmetrically, uvula midline 11th - shoulder shrug symmetric 12th - tongue protrusion midline  MOTOR:  normal bulk and tone, at least antigravity, RUE is limited by pain.   SENSORY:  normal and symmetric to light touch  COORDINATION:  finger-nose-finger normal on the left   REFLEXES:  deep tendon reflexes present and symmetric in the BUE and brisk in the BLE  GAIT/STATION:  Walks with a walker, small strides but mild shuffling noted.      DIAGNOSTIC DATA (LABS, IMAGING, TESTING) - I reviewed patient records, labs, notes, testing and imaging myself where available.  Lab Results  Component Value Date   WBC 7.9 12/08/2022   HGB 14.0  12/08/2022   HCT 44.0 12/08/2022   MCV 96.1 12/08/2022   PLT 237 12/08/2022      Component Value Date/Time   NA 136 12/08/2022 1126   NA 139 02/22/2020 1252   K 4.1 12/08/2022 1126   CL 99 12/08/2022 1126   CO2 27 12/08/2022 1126   GLUCOSE 107 (H) 12/08/2022 1126   BUN 15 12/08/2022 1126   BUN 11 02/22/2020 1252   CREATININE 1.02 (H) 12/08/2022 1126   CALCIUM 9.0 12/08/2022 1126   PROT 7.1 12/08/2022 1126   PROT 6.6 02/22/2020 1252   ALBUMIN 3.9 12/08/2022 1126   ALBUMIN 4.0 02/22/2020 1252   AST 20 12/08/2022 1126   ALT 16 12/08/2022 1126   ALKPHOS 93 12/08/2022 1126   BILITOT 0.5 12/08/2022 1126   BILITOT 0.3 02/22/2020 1252   GFRNONAA 58 (L) 12/08/2022 1126   GFRAA >60 04/19/2020 1219   Lab Results  Component Value Date   CHOL 163 02/22/2020   HDL 53 02/22/2020   LDLCALC 96 02/22/2020   TRIG 74 02/22/2020   CHOLHDL 3.1 02/22/2020   Lab Results  Component Value Date   HGBA1C 5.7 06/08/2017   Lab Results   Component Value Date   VITAMINB12 213 10/31/2019   Lab Results  Component Value Date   TSH 2.690 07/14/2022    CT Head 05/2020 1. No acute intracranial abnormality. 2. Left supraorbital and periorbital soft tissue swelling with palpebral thickening. No calvarial fracture. No globe injury. No visible facial bone fracture within the included margins of imaging though portions of the orbits and remaining facial bones are excluded on this exam. If there is persisting clinical concern, maxillofacial CT should be obtained. 3. Asymmetric anterior positioning of the right temporomandibular joint, nonspecific. Correlate with clinical exam features and for tenderness at the right temporomandibular joint. 4. Mild parenchymal volume loss and chronic microvascular angiopathy.   Head CT 12/08/2022 1. Atrophy and small vessel disease. 2. No evidence for acute intracranial abnormality.    ASSESSMENT AND PLAN  72 y.o. year old female with multiple medical conditions including hypertension, hypothyroidism, hyperlipidemia, generalized anxiety, depression, GERD, recent fracture of the shaft of the right humerus and recent fracture of the left fibula s/p falls who is presenting for follow up for with multiple falls.  She still participating in physical therapy but still having falls.  Again etiology remains unclear but on today's exam she was noted to have brisk reflexes bilaterally, limited cervical spine range of motion with pain pointing out to a possible cervical myelopathy.  I will obtain an MRI cervical spine to rule out cervical myelopathy.  She was also found to have prolonged QT and is set to see cardiology.  I will contact the patient to go over results of the MRI but plan will be to continue physical therapy and also to continue using a walker.  Continue to follow with PCP return as needed   1. Cervical myelopathy (HCC)   2. Falls frequently   3. Gait abnormality     Patient Instructions  MRI  cervical spine to rule out cervical myelopathy as a causes of the recurrent falls Continue using a walker Continue with physical therapy Continue to follow with PCP and cardiology Return as needed.   Orders Placed This Encounter  Procedures   MR CERVICAL SPINE WO CONTRAST     No orders of the defined types were placed in this encounter.    Return if symptoms worsen or fail to improve.  Windell Norfolk, MD 12/17/2022, 10:20 AM  [facility] [address], [address] [place], [place] [address] [phone]

## 2022-12-17 ENCOUNTER — Encounter: Payer: Self-pay | Admitting: Neurology

## 2022-12-17 NOTE — Patient Instructions (Signed)
MRI cervical spine to rule out cervical myelopathy as a causes of the recurrent falls Continue using a walker Continue with physical therapy Continue to follow with PCP and cardiology Return as needed.

## 2022-12-20 ENCOUNTER — Telehealth: Payer: Self-pay | Admitting: Neurology

## 2022-12-20 NOTE — Telephone Encounter (Signed)
medicare/medicaid NPR sent to GI [phone] 

## 2023-01-09 LAB — TSH: TSH: 1.72 u[IU]/mL (ref 0.450–4.500)

## 2023-01-09 LAB — T4, FREE: Free T4: 1.56 ng/dL (ref 0.82–1.77)

## 2023-01-09 LAB — THYROGLOBULIN ANTIBODY: Thyroglobulin Antibody: <1 IU/mL (ref 0.0–0.9)

## 2023-01-09 LAB — THYROGLOBULIN LEVEL: Thyroglobulin (TG-RIA): 19 ng/mL

## 2023-01-16 ENCOUNTER — Emergency Department (HOSPITAL_BASED_OUTPATIENT_CLINIC_OR_DEPARTMENT_OTHER)
Admission: EM | Admit: 2023-01-16 | Discharge: 2023-01-16 | Disposition: A | Discharge status: Home / Self Care | Payer: Medicare Other | Attending: Emergency Medicine | Admitting: Emergency Medicine

## 2023-01-16 ENCOUNTER — Encounter (HOSPITAL_BASED_OUTPATIENT_CLINIC_OR_DEPARTMENT_OTHER): Payer: Self-pay | Admitting: Emergency Medicine

## 2023-01-16 ENCOUNTER — Other Ambulatory Visit: Payer: Self-pay

## 2023-01-16 ENCOUNTER — Emergency Department (HOSPITAL_BASED_OUTPATIENT_CLINIC_OR_DEPARTMENT_OTHER): Payer: Medicare Other

## 2023-01-16 DIAGNOSIS — Z7901 Long term (current) use of anticoagulants: Secondary | ICD-10-CM | POA: Insufficient documentation

## 2023-01-16 DIAGNOSIS — R102 Pelvic and perineal pain: Secondary | ICD-10-CM | POA: Diagnosis not present

## 2023-01-16 DIAGNOSIS — Z79899 Other long term (current) drug therapy: Secondary | ICD-10-CM | POA: Insufficient documentation

## 2023-01-16 DIAGNOSIS — Z79890 Hormone replacement therapy (postmenopausal): Secondary | ICD-10-CM | POA: Diagnosis not present

## 2023-01-16 DIAGNOSIS — Z86718 Personal history of other venous thrombosis and embolism: Secondary | ICD-10-CM | POA: Insufficient documentation

## 2023-01-16 DIAGNOSIS — K5900 Constipation, unspecified: Secondary | ICD-10-CM | POA: Insufficient documentation

## 2023-01-16 DIAGNOSIS — K769 Liver disease, unspecified: Secondary | ICD-10-CM | POA: Insufficient documentation

## 2023-01-16 DIAGNOSIS — Z7982 Long term (current) use of aspirin: Secondary | ICD-10-CM | POA: Insufficient documentation

## 2023-01-16 DIAGNOSIS — K5730 Diverticulosis of large intestine without perforation or abscess without bleeding: Secondary | ICD-10-CM | POA: Insufficient documentation

## 2023-01-16 DIAGNOSIS — K449 Diaphragmatic hernia without obstruction or gangrene: Secondary | ICD-10-CM | POA: Insufficient documentation

## 2023-01-16 DIAGNOSIS — R338 Other retention of urine: Secondary | ICD-10-CM

## 2023-01-16 DIAGNOSIS — R1030 Lower abdominal pain, unspecified: Secondary | ICD-10-CM | POA: Insufficient documentation

## 2023-01-16 DIAGNOSIS — R339 Retention of urine, unspecified: Secondary | ICD-10-CM | POA: Insufficient documentation

## 2023-01-16 DIAGNOSIS — K219 Gastro-esophageal reflux disease without esophagitis: Secondary | ICD-10-CM | POA: Diagnosis not present

## 2023-01-16 DIAGNOSIS — I509 Heart failure, unspecified: Secondary | ICD-10-CM | POA: Diagnosis not present

## 2023-01-16 LAB — COMPREHENSIVE METABOLIC PANEL
ALT: 9 U/L (ref 0–44)
AST: 14 U/L — ABNORMAL LOW (ref 15–41)
Albumin: 4 g/dL (ref 3.5–5.0)
Alkaline Phosphatase: 98 U/L (ref 38–126)
Anion gap: 8 (ref 5–15)
BUN: 11 mg/dL (ref 8–23)
CO2: 30 mmol/L (ref 22–32)
Calcium: 9.3 mg/dL (ref 8.9–10.3)
Chloride: 98 mmol/L (ref 98–111)
Creatinine, Ser: 0.83 mg/dL (ref 0.44–1.00)
GFR, Estimated: >60 mL/min (ref >60)
Glucose, Bld: 106 mg/dL — ABNORMAL HIGH (ref 70–99)
Potassium: 3.8 mmol/L (ref 3.5–5.1)
Sodium: 136 mmol/L (ref 135–145)
Total Bilirubin: 0.5 mg/dL (ref 0.3–1.2)
Total Protein: 6.8 g/dL (ref 6.5–8.1)

## 2023-01-16 LAB — CBC WITH DIFFERENTIAL/PLATELET
Abs Immature Granulocytes: 0.04 10*3/uL (ref 0.00–0.07)
Basophils Absolute: 0 10*3/uL (ref 0.0–0.1)
Basophils Relative: 1 %
Eosinophils Absolute: 0 10*3/uL (ref 0.0–0.5)
Eosinophils Relative: 1 %
HCT: 39.5 % (ref 36.0–46.0)
Hemoglobin: 13 g/dL (ref 12.0–15.0)
Immature Granulocytes: 1 %
Lymphocytes Relative: 9 %
Lymphs Abs: 0.5 10*3/uL — ABNORMAL LOW (ref 0.7–4.0)
MCH: 30.4 pg (ref 26.0–34.0)
MCHC: 32.9 g/dL (ref 30.0–36.0)
MCV: 92.3 fL (ref 80.0–100.0)
Monocytes Absolute: 0.4 10*3/uL (ref 0.1–1.0)
Monocytes Relative: 7 %
Neutro Abs: 4.7 10*3/uL (ref 1.7–7.7)
Neutrophils Relative %: 81 %
Platelets: 219 10*3/uL (ref 150–400)
RBC: 4.28 MIL/uL (ref 3.87–5.11)
RDW: 13.2 % (ref 11.5–15.5)
WBC: 5.8 10*3/uL (ref 4.0–10.5)
nRBC: 0 % (ref 0.0–0.2)

## 2023-01-16 LAB — URINALYSIS, W/ REFLEX TO CULTURE (INFECTION SUSPECTED)
Bacteria, UA: NONE SEEN
Bilirubin Urine: NEGATIVE
Glucose, UA: NEGATIVE mg/dL
Hgb urine dipstick: NEGATIVE
Ketones, ur: NEGATIVE mg/dL
Leukocytes,Ua: NEGATIVE
Nitrite: NEGATIVE
Protein, ur: NEGATIVE mg/dL
Specific Gravity, Urine: 1.016 (ref 1.005–1.030)
pH: 6.5 (ref 5.0–8.0)

## 2023-01-16 LAB — LACTIC ACID, PLASMA: Lactic Acid, Venous: 1.3 mmol/L (ref 0.5–1.9)

## 2023-01-16 LAB — LIPASE, BLOOD: Lipase: 16 U/L (ref 11–51)

## 2023-01-16 MED ORDER — FLEET ENEMA 7-19 GM/118ML RE ENEM
1.0000 | ENEMA | Freq: Once | RECTAL | Status: DC
  Filled 2023-01-16: qty 1

## 2023-01-16 MED ORDER — IOHEXOL 300 MG/ML  SOLN
100.0000 mL | Freq: Once | INTRAMUSCULAR | Status: AC | PRN
  Administered 2023-01-16: 100 mL via INTRAVENOUS

## 2023-01-16 MED ORDER — GLYCERIN (ADULT) 2 G RE SUPP: 1.0000 | RECTAL | 0 refills | Status: AC | PRN

## 2023-01-16 MED ORDER — POLYETHYLENE GLYCOL 3350 17 GM/SCOOP PO POWD: 1.0000 | Freq: Once | ORAL | 0 refills | Status: AC

## 2023-01-16 MED ORDER — LACTATED RINGERS IV BOLUS
1000.0000 mL | Freq: Once | INTRAVENOUS | Status: AC
  Administered 2023-01-16: 1000 mL via INTRAVENOUS

## 2023-01-16 NOTE — ED Notes (Signed)
Pt assisted to wheelchair after dressing patient. Pts sister reviewed dc instructions and stated understanding.

## 2023-01-16 NOTE — ED Provider Notes (Signed)
[place] EMERGENCY DEPARTMENT AT [facility] Provider Note   CSN: [id number] Arrival date & time: 01/16/23  4098     History  No chief complaint on file.   Renee Davidson is a 72 y.o. female.  HPI   72 year old female with medical history significant for depression, anxiety, CHF, DVT on Xarelto, GERD who presents to the emergency department with an inability to void.  The patient states that she is unsure the last time she was able to void.  She does not remember voiding over the past day or 2.  She endorses suprapubic discomfort in addition to sensation of urgency.  She denies any fevers or chills, no nausea or vomiting.  Home Medications Prior to Admission medications   Medication Sig Start Date End Date Taking? Authorizing Provider  glycerin adult 2 g suppository Place 1 suppository rectally as needed for constipation. 01/16/23  Yes Ernie Avena, MD  polyethylene glycol powder (GLYCOLAX/MIRALAX) 17 GM/SCOOP powder Take 255 g by mouth once for 1 dose. 01/16/23 01/16/23 Yes Ernie Avena, MD  Acetaminophen 500 MG capsule Take 1,000 mg by mouth in the morning, at noon, and at bedtime.    [provider]  albuterol (VENTOLIN HFA) 108 (90 Base) MCG/ACT inhaler Inhale into the lungs 2 (two) times daily as needed for wheezing or shortness of breath.    [provider]  allopurinol (ZYLOPRIM) 100 MG tablet Take 100 mg by mouth daily. 05/18/21   [provider]  aspirin 81 MG EC tablet Take 81 mg by mouth daily. Swallow whole.    [provider]  benzonatate (TESSALON) 100 MG capsule Take 1 capsule (100 mg total) by mouth 3 (three) times daily as needed for cough. 11/08/22   Glenford Bayley, NP  cetirizine (ZYRTEC) 10 MG tablet Take 10 mg by mouth daily. 10/13/22   [provider]  dextromethorphan (DELSYM) 30 MG/5ML liquid Take 60 mg by mouth every 12 (twelve) hours as needed for cough.    [provider]   Dextromethorphan-guaiFENesin 10-100 MG/5ML liquid Take 10 mLs by mouth daily as needed (cough).    [provider]  diclofenac Sodium (VOLTAREN) 1 % GEL Apply 1 application  topically in the morning, at noon, and at bedtime.    [provider]  docusate sodium (COLACE) 100 MG capsule Take 100 mg by mouth daily.    [provider]  escitalopram (LEXAPRO) 20 MG tablet Take 1 tablet (20 mg total) by mouth daily. 02/22/20   [name] [place]-Margaret, FNP  ferrous sulfate 325 (65 FE) MG tablet Take 1 tablet (325 mg total) by mouth daily with breakfast. 02/22/20   [name], [place]-Margaret, FNP  furosemide (LASIX) 20 MG tablet Take 20 mg by mouth daily. 05/25/21   [provider]  hydrochlorothiazide (MICROZIDE) 12.5 MG capsule Take 12.5 mg by mouth daily. 07/18/21   [provider]  hydrocortisone cream 1 % Apply 1 Application topically 3 (three) times daily.    [provider]  ipratropium-albuterol (DUONEB) 0.5-2.5 (3) MG/3ML SOLN Take 3 mLs by nebulization every 6 (six) hours as needed.    [provider]  levothyroxine (SYNTHROID) 100 MCG tablet Take 100 mcg by mouth daily before breakfast.    [provider]  LORazepam (ATIVAN) 0.5 MG tablet Take 0.5 mg by mouth at bedtime.    [provider]  lovastatin (MEVACOR) 40 MG tablet Take 1 tablet (40 mg total) by mouth at bedtime. 12/04/19   Bennie Pierini, FNP  methocarbamol (ROBAXIN) 500 MG tablet Take 500 mg by mouth every 8 (eight) hours as needed for muscle spasms. 07/31/21   [provider]  metoprolol tartrate (LOPRESSOR) 25 MG tablet Take 12.5 mg by mouth 2 (two) times daily. 05/13/21   [provider]  mineral oil-hydrophilic petrolatum (AQUAPHOR) ointment Apply 1 application. topically in the morning, at noon, and at bedtime. Apply to buttocks and crease of buttocks for redness and irritation    [provider]  MUCINEX 600 MG 12 hr tablet Take  600 mg by mouth 2 (two) times daily. 06/22/21   [provider]  nystatin (MYCOSTATIN/NYSTOP) powder Apply 1 application  topically 3 (three) times daily. Under breasts and groin area 06/01/21   [provider]  OLANZapine (ZYPREXA) 2.5 MG tablet Take 2.5 mg by mouth daily with supper.    [provider]  oxybutynin (DITROPAN-XL) 5 MG 24 hr tablet Take 5 mg by mouth at bedtime.    [provider]  pantoprazole (PROTONIX) 40 MG tablet TAKE 1 TABLET DAILY 07/21/20   [name], [place], FNP  polyvinyl alcohol (LIQUIFILM TEARS) 1.4 % ophthalmic solution Place 1 drop into both eyes as needed for dry eyes.    [provider]  potassium chloride 20 MEQ/15ML (10%) SOLN Take 20 mEq by mouth daily. 08/25/21   [provider]  rivaroxaban (XARELTO) 20 MG TABS tablet Take 20 mg by mouth daily. 04/30/21   [provider]  traMADol (ULTRAM) 50 MG tablet Take 50 mg by mouth every 6 (six) hours as needed for moderate pain.    [provider]  vitamin C (ASCORBIC ACID) 250 MG tablet Take 250 mg by mouth daily.    [provider]      Allergies    Keflex [cephalexin], Sulfamethoxazole, and Doxycycline    Review of Systems   Review of Systems  All other systems reviewed and are negative.   Physical Exam Updated Vital Signs BP 136/67   Pulse 85   Temp 98.2 F (36.8 C) (Oral)   Resp 20   SpO2 100%  Physical Exam Vitals and nursing note reviewed.  Constitutional:      General: She is not in acute distress.    Appearance: She is well-developed. She is obese.  HENT:     Head: Normocephalic and atraumatic.  Eyes:     Conjunctiva/sclera: Conjunctivae normal.  Cardiovascular:     Rate and Rhythm: Normal rate and regular rhythm.  Pulmonary:     Effort: Pulmonary effort is normal. No respiratory distress.  Abdominal:     Palpations: Abdomen is soft.     Tenderness: There is abdominal tenderness.     Comments: Mild  suprapubic tenderness on exam  Musculoskeletal:        General: No swelling.     Cervical back: Neck supple.  Skin:    General: Skin is warm and dry.     Capillary Refill: Capillary refill takes less than 2 seconds.  Neurological:     Mental Status: She is alert.  Psychiatric:        Mood and Affect: Mood normal.     ED Results / Procedures / Treatments   Labs (all labs ordered are listed, but only abnormal results are displayed) Labs Reviewed  CBC WITH DIFFERENTIAL/PLATELET - Abnormal; Notable for the following components:      Result Value   Lymphs Abs 0.5 (*)    All other components within normal limits  COMPREHENSIVE METABOLIC PANEL -  Abnormal; Notable for the following components:   Glucose, Bld 106 (*)    AST 14 (*)    All other components within normal limits  URINALYSIS, W/ REFLEX TO CULTURE (INFECTION SUSPECTED)  LIPASE, BLOOD  LACTIC ACID, PLASMA    EKG None  Radiology CT ABDOMEN PELVIS W CONTRAST  Result Date: 01/16/2023 CLINICAL DATA:  Acute abdominal pain EXAM: CT ABDOMEN AND PELVIS WITH CONTRAST TECHNIQUE: Multidetector CT imaging of the abdomen and pelvis was performed using the standard protocol following bolus administration of intravenous contrast. RADIATION DOSE REDUCTION: This exam was performed according to the departmental dose-optimization program which includes automated exposure control, adjustment of the mA and/or kV according to patient size and/or use of iterative reconstruction technique. CONTRAST:  OMNIPAQUE IOHEXOL 300 MG/ML  SOLN COMPARISON:  11/14/2008 by report only FINDINGS: Lower chest: No pleural or pericardial effusion. Moderate hiatal hernia. Hepatobiliary: 2.1 cm low-attenuation subcapsular lesion in the right hepatic lobe, previously described measuring 1.7 cm on prior exam. No biliary ductal dilatation. Gallbladder physiologically distended without calcified gallstones. Pancreas: Unremarkable. No pancreatic ductal dilatation or  surrounding inflammatory changes. Spleen: Normal in size without focal abnormality. Adrenals/Urinary Tract: No adrenal mass. Symmetric renal enhancement without focal lesion, urolithiasis, or hydronephrosis. Urinary bladder decompressed by Foley catheter. Stomach/Bowel: Stomach decompressed, with moderate hiatal hernia. Small bowel nondistended. Normal appendix. Gas and fecal material incompletely distend the colon, with scattered descending and sigmoid diverticula. No regional inflammatory changes. Vascular/Lymphatic: No significant vascular findings are present. No enlarged abdominal or pelvic lymph nodes. Reproductive: Uterus and bilateral adnexa are unremarkable. Other: Bilateral pelvic phleboliths.  No ascites.  No free air. Musculoskeletal: Lumbar degenerative disc disease L2-L5. No fracture or worrisome bone lesion. IMPRESSION: 1. No acute findings. 2. Moderate hiatal hernia. 3. 2.1 cm right hepatic lobe lesion, previously described as 1.7 cm in 2010 suggesting benign process. 4. Colonic diverticulosis. Electronically Signed   By: Corlis Leak M.D.   On: 01/16/2023 10:38    Procedures Ultrasound ED Renal  Date/Time: 01/16/2023 8:38 AM  Performed by: Ernie Avena, MD Authorized by: Ernie Avena, MD   Procedure details:    Indications: urinary retention     Technique:  BladderImages: archivedStudy Limitations: body habitus and patient compliance Bladder findings:    Bladder:  Visualized   Free pelvic fluid: not identified     Volume:  363 Comments:     Acute urinary retention 363cc in the bladder     Medications Ordered in ED Medications  sodium phosphate (FLEET) 7-19 GM/118ML enema 1 enema (has no administration in time range)  lactated ringers bolus 1,000 mL (1,000 mLs Intravenous New Bag/Given 01/16/23 0918)  iohexol (OMNIPAQUE) 300 MG/ML solution 100 mL (100 mLs Intravenous Contrast Given 01/16/23 1014)    ED Course/ Medical Decision Making/ A&P                              Medical Decision Making Amount and/or Complexity of Data Reviewed Labs: ordered. Radiology: ordered.  Risk OTC drugs. Prescription drug management.    72 year old female with medical history significant for depression, anxiety, CHF, DVT on Xarelto, GERD who presents to the emergency department with an inability to void.  The patient states that she is unsure the last time she was able to void.  She does not remember voiding over the past day or 2.  She endorses suprapubic discomfort in addition to sensation of urgency.  She denies any fevers  or chills, no nausea or vomiting.  On arrival, the patient was afebrile, hemodynamically stable.  Physical exam concerning for suprapubic tenderness to palpation.  Bladder scan unable to fully visualize the bladder therefore POCUS ultrasound was obtained which revealed 363 cc in the bladder.  Concern for acute urinary retention.  A Foley catheter was placed.  UA: Neg for hematuria or UTI.  Pt still endorsed abdominal discomfort after foley placement. She cannot remember the last time she had a bowel movement. Will obtain labs and CT imaging to further assess.  Labs: Lactic acid, lipase, CMP, CBC unremarkable.  CT Abdomen Pelvis: IMPRESSION:  1. No acute findings.  2. Moderate hiatal hernia.  3. 2.1 cm right hepatic lobe lesion, previously described as 1.7 cm  in 2010 suggesting benign process.  4. Colonic diverticulosis.    Enema performed in the ED due to concern for constipation resulting in urinary retention.  Enema resulted in a satisfactory bowel movement.  Patient overall well-appearing, will discharge with outpatient urology follow-up.   Final Clinical Impression(s) / ED Diagnoses Final diagnoses:  Constipation, unspecified constipation type  Acute urinary retention    Rx / DC Orders ED Discharge Orders          Ordered    glycerin adult 2 g suppository  As needed        01/16/23 1118    polyethylene glycol powder  (GLYCOLAX/MIRALAX) 17 GM/SCOOP powder   Once        01/16/23 1118              Ernie Avena, MD 01/16/23 1119

## 2023-01-16 NOTE — ED Notes (Signed)
14 french foley placed successfully. Clear yellow urine immediate return.

## 2023-01-16 NOTE — ED Notes (Signed)
Transported to ct 

## 2023-01-16 NOTE — ED Triage Notes (Signed)
Pt arrives with daughter POV from assisted living. Pt cannot remember the last time she voided and is feeling the urge/pressure to go but cannot. Pt had to have a foley inserted last year after surgery due to urinary retention and was able to eventually have it removed.

## 2023-01-16 NOTE — ED Notes (Signed)
Fleets enema administered. Pt currently sitting on the toilet.

## 2023-01-16 NOTE — Discharge Instructions (Addendum)
CT imaging and laboratory evaluation was reassuring.  Symptoms are consistent with likely constipation resulting in acute urinary retention.  Recommend utilizing a glycerin suppository as needed for constipation for satisfactory bowel movement.  Can also try outpatient MiraLAX, 1 scoop daily as needed and continue to drink plenty of fluids to maintain hydration.

## 2023-01-18 ENCOUNTER — Ambulatory Visit
Admission: RE | Admit: 2023-01-18 | Discharge: 2023-01-18 | Disposition: A | Discharge status: Home / Self Care | Payer: Medicare Other | Source: Ambulatory Visit | Attending: Neurology | Admitting: Neurology

## 2023-01-18 DIAGNOSIS — G959 Disease of spinal cord, unspecified: Secondary | ICD-10-CM | POA: Diagnosis not present

## 2023-01-20 ENCOUNTER — Encounter: Payer: Self-pay | Admitting: "Endocrinology

## 2023-01-20 ENCOUNTER — Ambulatory Visit (INDEPENDENT_AMBULATORY_CARE_PROVIDER_SITE_OTHER): Payer: Medicare Other | Admitting: "Endocrinology

## 2023-01-20 VITALS — BP 118/64 | HR 68 | Ht 65.0 in

## 2023-01-20 DIAGNOSIS — E890 Postprocedural hypothyroidism: Secondary | ICD-10-CM

## 2023-01-20 DIAGNOSIS — C73 Malignant neoplasm of thyroid gland: Secondary | ICD-10-CM

## 2023-01-20 MED ORDER — LEVOTHYROXINE SODIUM 100 MCG PO TABS: 100.0000 ug | ORAL_TABLET | Freq: Every day | ORAL | 1 refills | Status: AC

## 2023-01-20 NOTE — Progress Notes (Signed)
01/20/2023, 5:21 PM  Endocrinology follow-up note   Subjective:    Patient ID: Renee Davidson, female    DOB: 1951-01-06, PCP [name], MD   Past Medical History:  Diagnosis Date   Anxiety    Cancer [facility])    malignant thyroid   CHF (congestive heart failure) (HCC)    hx of 09/2020 per note   Conversion disorder with seizures or convulsions    Depression    Diverticulosis    DVT (deep venous thrombosis) (HCC)    right   Dyspnea    Dysrhythmia    hx of SVT- 01/14/21 per note   Essential hypertension    Fracture of right humerus    GERD (gastroesophageal reflux disease)    Hiatal hernia    Hiatal hernia    Humerus fracture    right   Hx of falling    Hyperlipidemia    Hypothyroidism    Mental developmental delay    OCD (obsessive compulsive disorder)    Osteoporosis    Peptic ulcer    Pneumonia    hx of pneumonal several times per sister at preop   Thyroid disease    Thyroid nodule    Past Surgical History:  Procedure Laterality Date   BIOPSY  11/02/2019   Procedure: BIOPSY;  Surgeon: Iva Boop, MD;  Location: [facility];  Service: Endoscopy;;   COLONOSCOPY WITH PROPOFOL N/A 11/02/2019   Procedure: COLONOSCOPY WITH PROPOFOL;  Surgeon: Iva Boop, MD;  Location: [facility];  Service: Endoscopy;  Laterality: N/A;   ESOPHAGOGASTRODUODENOSCOPY (EGD) WITH PROPOFOL N/A 11/01/2019   Procedure: ESOPHAGOGASTRODUODENOSCOPY (EGD) WITH PROPOFOL;  Surgeon: Iva Boop, MD;  Location: [facility];  Service: Endoscopy;  Laterality: N/A;   HUMERUS FRACTURE SURGERY Right 08/13/2020   THYROID LOBECTOMY Right 12/21/2021   Procedure: RIGHT THYROID LOBECTOMY;  Surgeon: Darnell Level, MD;  Location: [facility];  Service: General;  Laterality: Right;   TONSILLECTOMY     Social History   Socioeconomic History   Marital status: Single    Spouse name: Not on file   Number of children: 0   Years of education: 12    Highest education level: 12th grade  Occupational History   Occupation: retired    Comment: [employer]  Tobacco Use   Smoking status: Never   Smokeless tobacco: Never  Vaping Use   Vaping Use: Never used  Substance and Sexual Activity   Alcohol use: No   Drug use: No   Sexual activity: Never  Other Topics Concern   Not on file  Social History Narrative   Not on file   Social Determinants of Health   Financial Resource Strain: Low Risk  (07/04/2017)   Overall Financial Resource Strain (CARDIA)    Difficulty of Paying Living Expenses: Not hard at all  Food Insecurity: No Food Insecurity (07/04/2017)   Hunger Vital Sign    Worried About Running Out of Food in the Last Year: Never true    Ran Out of Food in the Last Year: Never true  Transportation Needs: No Transportation Needs (07/04/2017)   PRAPARE - Administrator, Civil Service (Medical): No    Lack of Transportation (Non-Medical):  No  Physical Activity: Insufficiently Active (01/23/2018)   Exercise Vital Sign    Days of Exercise per Week: 3 days    Minutes of Exercise per Session: 30 min  Stress: No Stress Concern Present (07/04/2017)   [employer] of Occupational Health - Occupational Stress Questionnaire    Feeling of Stress : Not at all  Social Connections: Moderately Integrated (07/04/2017)   Social Connection and Isolation Panel [NHANES]    Frequency of Communication with Friends and Family: More than three times a week    Frequency of Social Gatherings with Friends and Family: More than three times a week    Attends Religious Services: More than 4 times per year    Active Member of [employer] or Organizations: Yes    Attends Engineer, structural: More than 4 times per year    Marital Status: Never married   Family History  Problem Relation Age of Onset   GI problems Mother    Hypertension Mother    Hyperlipidemia Mother    Heart disease Mother    Diabetes Father    Hyperlipidemia  Father    Hypertension Father    Atrial fibrillation Father    Heart attack Father    Osteoporosis Father    Colon polyps Father    Hyperlipidemia Sister    Dysphagia Sister    Heart disease Maternal Grandfather    Colon cancer Maternal Grandfather    Diabetes Paternal Grandmother    Heart disease Paternal Grandmother    Outpatient Encounter Medications as of 01/20/2023  Medication Sig   Acetaminophen 500 MG capsule Take 1,000 mg by mouth in the morning, at noon, and at bedtime.   albuterol (VENTOLIN HFA) 108 (90 Base) MCG/ACT inhaler Inhale into the lungs 2 (two) times daily as needed for wheezing or shortness of breath.   allopurinol (ZYLOPRIM) 100 MG tablet Take 100 mg by mouth daily.   aspirin 81 MG EC tablet Take 81 mg by mouth daily. Swallow whole.   benzonatate (TESSALON) 100 MG capsule Take 1 capsule (100 mg total) by mouth 3 (three) times daily as needed for cough.   cetirizine (ZYRTEC) 10 MG tablet Take 10 mg by mouth daily. (Patient not taking: Reported on 01/20/2023)   dextromethorphan (DELSYM) 30 MG/5ML liquid Take 60 mg by mouth every 12 (twelve) hours as needed for cough.   Dextromethorphan-guaiFENesin 10-100 MG/5ML liquid Take 10 mLs by mouth daily as needed (cough).   diclofenac Sodium (VOLTAREN) 1 % GEL Apply 1 application  topically in the morning, at noon, and at bedtime.   docusate sodium (COLACE) 100 MG capsule Take 100 mg by mouth daily.   escitalopram (LEXAPRO) 20 MG tablet Take 1 tablet (20 mg total) by mouth daily.   ferrous sulfate 325 (65 FE) MG tablet Take 1 tablet (325 mg total) by mouth daily with breakfast.   furosemide (LASIX) 20 MG tablet Take 20 mg by mouth daily.   glycerin adult 2 g suppository Place 1 suppository rectally as needed for constipation.   hydrochlorothiazide (MICROZIDE) 12.5 MG capsule Take 12.5 mg by mouth daily.   hydrocortisone cream 1 % Apply 1 Application topically 3 (three) times daily.   ipratropium-albuterol (DUONEB) 0.5-2.5 (3)  MG/3ML SOLN Take 3 mLs by nebulization every 6 (six) hours as needed.   levothyroxine (SYNTHROID) 100 MCG tablet Take 1 tablet (100 mcg total) by mouth daily before breakfast.   LORazepam (ATIVAN) 0.5 MG tablet Take 0.5 mg by mouth at bedtime.   lovastatin (  MEVACOR) 40 MG tablet Take 1 tablet (40 mg total) by mouth at bedtime.   methocarbamol (ROBAXIN) 500 MG tablet Take 500 mg by mouth every 8 (eight) hours as needed for muscle spasms.   metoprolol tartrate (LOPRESSOR) 25 MG tablet Take 12.5 mg by mouth 2 (two) times daily.   mineral oil-hydrophilic petrolatum (AQUAPHOR) ointment Apply 1 application. topically in the morning, at noon, and at bedtime. Apply to buttocks and crease of buttocks for redness and irritation   MUCINEX 600 MG 12 hr tablet Take 600 mg by mouth 2 (two) times daily.   nystatin (MYCOSTATIN/NYSTOP) powder Apply 1 application  topically 3 (three) times daily. Under breasts and groin area   OLANZapine (ZYPREXA) 2.5 MG tablet Take 2.5 mg by mouth daily with supper.   oxybutynin (DITROPAN-XL) 5 MG 24 hr tablet Take 5 mg by mouth at bedtime.   pantoprazole (PROTONIX) 40 MG tablet TAKE 1 TABLET DAILY   polyvinyl alcohol (LIQUIFILM TEARS) 1.4 % ophthalmic solution Place 1 drop into both eyes as needed for dry eyes.   potassium chloride 20 MEQ/15ML (10%) SOLN Take 20 mEq by mouth daily.   rivaroxaban (XARELTO) 20 MG TABS tablet Take 20 mg by mouth daily.   traMADol (ULTRAM) 50 MG tablet Take 50 mg by mouth every 6 (six) hours as needed for moderate pain.   vitamin C (ASCORBIC ACID) 250 MG tablet Take 250 mg by mouth daily.   [DISCONTINUED] levothyroxine (SYNTHROID) 100 MCG tablet Take 100 mcg by mouth daily before breakfast.   No facility-administered encounter medications on file as of 01/20/2023.   ALLERGIES: Allergies  Allergen Reactions   Keflex [Cephalexin] Other (See Comments)    Dizzy, lightheaded, falls   Sulfamethoxazole Other (See Comments)   Doxycycline Rash     VACCINATION STATUS: Immunization History  Administered Date(s) Administered   Influenza Whole 05/18/2013, 05/07/2014, 05/06/2015   Influenza, High Dose Seasonal PF 05/14/2016, 05/06/2017, 05/09/2018   Influenza,inj,Quad PF,6+ Mos 05/18/2013, 05/07/2014, 05/06/2015   Influenza-Unspecified 05/09/2020   Moderna Sars-Covid-2 Vaccination 09/06/2019, 10/04/2019, 06/18/2020, 11/25/2020, 04/30/2021   Pneumococcal Conjugate-13 01/17/2014   Pneumococcal Polysaccharide-23 07/01/2015   Tdap 05/31/2011, 05/11/2020   Zoster Recombinat (Shingrix) 07/04/2017, 10/18/2017   Zoster, Live 03/04/2014    HPI Renee Davidson is 72 y.o. female who presents today with a medical history as above. she is being seen in follow-up after her recent partial thyroidectomy by Dr. Gerrit Friends, postsurgical hypothyroidism.  She underwent right lobectomy/isthmusectomy of her thyroid on Dec 21, 2021.  Surgical sample showed noninvasive thyroid malignancy, see below.   Over the last several visits, her levothyroxine dose was adjusted progressively.    She is currently taking levothyroxine 100 mcg p.o. daily before breakfast.  Her previsit labs are consistent with appropriate replacement.  She is accompanied to clinic by her sister.   She is known to have hypothyroidism for at least 10 years.    In November 2021 she was found to have incidental thyroid nodule of around the right lobe of her thyroid on CT scan of the chest.  Subsequent ultrasound confirmed 3 cm nodule on the right lobe with suspicious features.  She underwent fine-needle aspiration on November 24 which revealed atypia of undetermined significance.  A sample was sent for Afirma, results are consistent with approximately 50% risk of malignancy.    She denies dysphagia, shortness of breath, nor voice change.    She has no family history of thyroid malignancy.  She reports minimally fluctuating body weight.  She uses  a walker to get around due to disequilibrium.     Review of Systems  Constitutional: no recent weight gain/loss, no fatigue, no subjective hyperthermia, no subjective hypothermia   Objective:       01/20/2023   10:32 AM 01/16/2023   11:15 AM 01/16/2023    8:13 AM  Vitals with BMI  Height 5\' 5"     Systolic 118 124 409  Diastolic 64 68 67  Pulse 68 78 85    BP 118/64   Pulse 68   Ht 5\' 5"  (1.651 m)   BMI 43.68 kg/m   Wt Readings from Last 3 Encounters:  12/16/22 262 lb 8 oz (119.1 kg)  12/08/22 268 lb 15.4 oz (122 kg)  11/28/22 268 lb 15.4 oz (122 kg)    Physical Exam  Constitutional:  Body mass index is 43.68 kg/m.,  not in acute distress, normal state of mind Eyes: PERRLA, EOMI, no exophthalmos ENT: moist mucous membranes, + palpable thyroid,  gross cervical lymphadenopathy   CMP ( most recent) CMP     Component Value Date/Time   NA 136 01/16/2023 0911   NA 139 02/22/2020 1252   K 3.8 01/16/2023 0911   CL 98 01/16/2023 0911   CO2 30 01/16/2023 0911   GLUCOSE 106 (H) 01/16/2023 0911   BUN 11 01/16/2023 0911   BUN 11 02/22/2020 1252   CREATININE 0.83 01/16/2023 0911   CALCIUM 9.3 01/16/2023 0911   PROT 6.8 01/16/2023 0911   PROT 6.6 02/22/2020 1252   ALBUMIN 4.0 01/16/2023 0911   ALBUMIN 4.0 02/22/2020 1252   AST 14 (L) 01/16/2023 0911   ALT 9 01/16/2023 0911   ALKPHOS 98 01/16/2023 0911   BILITOT 0.5 01/16/2023 0911   BILITOT 0.3 02/22/2020 1252   GFRNONAA >60 01/16/2023 0911   GFRAA >60 04/19/2020 1219     Diabetic Labs (most recent): Lab Results  Component Value Date   HGBA1C 5.7 06/08/2017     Lipid Panel ( most recent) Lipid Panel     Component Value Date/Time   CHOL 163 02/22/2020 1252   TRIG 74 02/22/2020 1252   TRIG 49 12/18/2014 1209   HDL 53 02/22/2020 1252   HDL 55 12/18/2014 1209   CHOLHDL 3.1 02/22/2020 1252   LDLCALC 96 02/22/2020 1252   LDLCALC 89 01/17/2014 0946   LABVLDL 14 02/22/2020 1252      Lab Results  Component Value Date   TSH 1.720 12/30/2022   TSH  2.690 07/14/2022   TSH 6.640 (H) 01/12/2022   TSH 0.893 02/22/2020   TSH 0.761 12/04/2019   TSH 2.407 10/31/2019   TSH 2.130 03/26/2019   TSH 1.360 07/07/2018   TSH 0.282 (L) 05/09/2018   TSH 0.428 (L) 11/04/2017   FREET4 1.56 12/30/2022   FREET4 1.36 07/14/2022   FREET4 1.07 01/12/2022    Fine-needle aspiration of right lobe thyroid nodule on June 25, 2020 FINAL MICROSCOPIC DIAGNOSIS:  - Atypia of undetermined significance (Bethesda category III)   SPECIMEN ADEQUACY:  Satisfactory for evaluation   DIAGNOSTIC COMMENTS:  This specimen will be sent for Afirma testing.   Molecular pathology results were received, indicating approximately 50% risk of malignancy.  Dec 21, 2021 FINAL MICROSCOPIC DIAGNOSIS:   A. 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  Please see the following synoptic report. Assessment & Plan:   1.  Malignant neoplasm of the thyroid 2. Acquired  hypothyroidism   See note above. Patient is status post right hemithyroidectomy on Dec 21, 2021.  This was performed by Dr. Gerrit Friends.  Her final microscopic diagnosis showed noninvasive follicular thyroid neoplasm with papillary-like nuclear features (NIFTP) and clear margins of resection.  Based on the cytology description, she will not need adjuvant treatment.  She would not be considered for radioactive iodine thyroid ablation. She still had left lobe of her thyroid in place.  Her previsit thyroid function tests are consistent with appropriate replacement.  She is advised to continue amatoxin 100 mcg p.o. daily before breakfast.    - We discussed about the correct intake of her thyroid hormone, on empty stomach at fasting, with water, separated by at least 30 minutes from breakfast and other medications,  and separated by more than 4 hours from calcium, iron, multivitamins, acid  reflux medications (PPIs). -Patient is made aware of the fact that thyroid hormone replacement is needed for life, dose to be adjusted by periodic monitoring of thyroid function tests.   - she is advised to maintain close follow up with [name], MD for primary care needs.   I spent  21  minutes in the care of the patient today including review of labs from Thyroid Function, CMP, and other relevant labs ; imaging/biopsy records (current and previous including abstractions from other facilities); face-to-face time discussing  her lab results and symptoms, medications doses, her options of short and long term treatment based on the latest standards of care / guidelines;   and documenting the encounter.  Jearld Lesch Vondrasek  participated in the discussions, expressed understanding, and voiced agreement with the above plans.  All questions were answered to her satisfaction. she is encouraged to contact clinic should she have any questions or concerns prior to her return visit.    Follow up plan: Return in about 6 months (around 07/22/2023) for F/U with Pre-visit Labs.   Marquis Lunch, MD [facility] [address] [place], [place] [address] Phone: [phone]  Fax: [phone]     01/20/2023, 5:21 PM  This note was partially dictated with voice recognition software. Similar sounding words can be transcribed inadequately or may not  be corrected upon review.

## 2023-01-22 ENCOUNTER — Other Ambulatory Visit: Payer: Self-pay | Admitting: Neurology

## 2023-01-22 DIAGNOSIS — M5412 Radiculopathy, cervical region: Secondary | ICD-10-CM

## 2023-01-22 DIAGNOSIS — G959 Disease of spinal cord, unspecified: Secondary | ICD-10-CM

## 2023-01-24 ENCOUNTER — Telehealth: Payer: Self-pay | Admitting: Neurology

## 2023-01-24 NOTE — Telephone Encounter (Signed)
Referral faxed to [facility]: Phone: [phone]  Fax: [phone]

## 2023-01-26 ENCOUNTER — Telehealth: Payer: Self-pay | Admitting: Neurology

## 2023-01-26 ENCOUNTER — Ambulatory Visit (INDEPENDENT_AMBULATORY_CARE_PROVIDER_SITE_OTHER): Payer: Medicare Other | Admitting: Neurology

## 2023-01-26 ENCOUNTER — Ambulatory Visit (INDEPENDENT_AMBULATORY_CARE_PROVIDER_SITE_OTHER): Payer: Self-pay | Admitting: Neurology

## 2023-01-26 VITALS — BP 132/72 | HR 80 | Ht 65.0 in

## 2023-01-26 DIAGNOSIS — R269 Unspecified abnormalities of gait and mobility: Secondary | ICD-10-CM

## 2023-01-26 DIAGNOSIS — G959 Disease of spinal cord, unspecified: Secondary | ICD-10-CM

## 2023-01-26 DIAGNOSIS — Z0289 Encounter for other administrative examinations: Secondary | ICD-10-CM

## 2023-01-26 DIAGNOSIS — R2689 Other abnormalities of gait and mobility: Secondary | ICD-10-CM

## 2023-01-26 NOTE — Progress Notes (Unsigned)
No chief complaint on file.     ASSESSMENT AND PLAN  Renee Davidson is a 72 y.o. female     DIAGNOSTIC DATA (LABS, IMAGING, TESTING) - I reviewed patient records, labs, notes, testing and imaging myself where available.   MEDICAL HISTORY:  Renee Davidson, seen in request by   Renee Monday, MD [address] [place],  [place] [address], Renee Monday, MD   I reviewed and summarized the referring note. PMHX.  Past Medical History:  Diagnosis Date   Anxiety    Cancer (HCC)    malignant thyroid   CHF (congestive heart failure) (HCC)    hx of 09/2020 per note   Conversion disorder with seizures or convulsions    Depression    Diverticulosis    DVT (deep venous thrombosis) (HCC)    right   Dyspnea    Dysrhythmia    hx of SVT- 01/14/21 per note   Essential hypertension    Fracture of right humerus    GERD (gastroesophageal reflux disease)    Hiatal hernia    Hiatal hernia    Humerus fracture    right   Hx of falling    Hyperlipidemia    Hypothyroidism    Mental developmental delay    OCD (obsessive compulsive disorder)    Osteoporosis    Peptic ulcer    Pneumonia    hx of pneumonal several times per sister at preop   Thyroid disease    Thyroid nodule    Past Surgical History:  Procedure Laterality Date   BIOPSY  11/02/2019   Procedure: BIOPSY;  Surgeon: Iva Boop, MD;  Location: [facility];  Service: Endoscopy;;   COLONOSCOPY WITH PROPOFOL N/A 11/02/2019   Procedure: COLONOSCOPY WITH PROPOFOL;  Surgeon: Iva Boop, MD;  Location: [facility];  Service: Endoscopy;  Laterality: N/A;   ESOPHAGOGASTRODUODENOSCOPY (EGD) WITH PROPOFOL N/A 11/01/2019   Procedure: ESOPHAGOGASTRODUODENOSCOPY (EGD) WITH PROPOFOL;  Surgeon: Iva Boop, MD;  Location: [facility];  Service: Endoscopy;  Laterality: N/A;   HUMERUS FRACTURE SURGERY Right 08/13/2020   THYROID LOBECTOMY Right 12/21/2021   Procedure: RIGHT THYROID LOBECTOMY;  Surgeon: Darnell Level, MD;   Location: [facility];  Service: General;  Laterality: Right;   TONSILLECTOMY       PHYSICAL EXAM:   There were no vitals filed for this visit. Not recorded     There is no height or weight on file to calculate BMI.  PHYSICAL EXAMNIATION:  Gen: NAD, conversant, well nourised, well groomed                     Cardiovascular: Regular rate rhythm, no peripheral edema, warm, nontender. Eyes: Conjunctivae clear without exudates or hemorrhage Neck: Supple, no carotid bruits. Pulmonary: Clear to auscultation bilaterally   NEUROLOGICAL EXAM:  MENTAL STATUS: Speech/cognition: Awake, alert, oriented to history taking and casual conversation CRANIAL NERVES: CN II: Visual fields are full to confrontation. Pupils are round equal and briskly reactive to light. CN III, IV, VI: extraocular movement are normal. No ptosis. CN V: Facial sensation is intact to light touch CN VII: Face is symmetric with normal eye closure  CN VIII: Hearing is normal to causal conversation. CN IX, X: Phonation is normal. CN XI: Head turning and shoulder shrug are intact  MOTOR: There is no pronator drift of out-stretched arms. Muscle bulk and tone are normal. Muscle strength is normal.  REFLEXES: Reflexes are 2+ and symmetric at the biceps, triceps,  knees, and ankles. Plantar responses are flexor.  SENSORY: Intact to light touch, pinprick and vibratory sensation are intact in fingers and toes.  COORDINATION: There is no trunk or limb dysmetria noted.  GAIT/STANCE: Posture is normal. Gait is steady with normal steps, base, arm swing, and turning. Heel and toe walking are normal. Tandem gait is normal.  Romberg is absent.  REVIEW OF SYSTEMS:  Full 14 system review of systems performed and notable only for as above All other review of systems were negative.   ALLERGIES: Allergies  Allergen Reactions   Keflex [Cephalexin] Other (See Comments)    Dizzy, lightheaded, falls   Sulfamethoxazole Other (See  Comments)   Doxycycline Rash    HOME MEDICATIONS: Current Outpatient Medications  Medication Sig Dispense Refill   Acetaminophen 500 MG capsule Take 1,000 mg by mouth in the morning, at noon, and at bedtime.     albuterol (VENTOLIN HFA) 108 (90 Base) MCG/ACT inhaler Inhale into the lungs 2 (two) times daily as needed for wheezing or shortness of breath.     allopurinol (ZYLOPRIM) 100 MG tablet Take 100 mg by mouth daily.     aspirin 81 MG EC tablet Take 81 mg by mouth daily. Swallow whole.     benzonatate (TESSALON) 100 MG capsule Take 1 capsule (100 mg total) by mouth 3 (three) times daily as needed for cough. 45 capsule 0   cetirizine (ZYRTEC) 10 MG tablet Take 10 mg by mouth daily. (Patient not taking: Reported on 01/20/2023)     dextromethorphan (DELSYM) 30 MG/5ML liquid Take 60 mg by mouth every 12 (twelve) hours as needed for cough.     Dextromethorphan-guaiFENesin 10-100 MG/5ML liquid Take 10 mLs by mouth daily as needed (cough).     diclofenac Sodium (VOLTAREN) 1 % GEL Apply 1 application  topically in the morning, at noon, and at bedtime.     docusate sodium (COLACE) 100 MG capsule Take 100 mg by mouth daily.     escitalopram (LEXAPRO) 20 MG tablet Take 1 tablet (20 mg total) by mouth daily. 90 tablet 1   ferrous sulfate 325 (65 FE) MG tablet Take 1 tablet (325 mg total) by mouth daily with breakfast. 90 tablet 1   furosemide (LASIX) 20 MG tablet Take 20 mg by mouth daily.     glycerin adult 2 g suppository Place 1 suppository rectally as needed for constipation. 12 suppository 0   hydrochlorothiazide (MICROZIDE) 12.5 MG capsule Take 12.5 mg by mouth daily.     hydrocortisone cream 1 % Apply 1 Application topically 3 (three) times daily.     ipratropium-albuterol (DUONEB) 0.5-2.5 (3) MG/3ML SOLN Take 3 mLs by nebulization every 6 (six) hours as needed.     levothyroxine (SYNTHROID) 100 MCG tablet Take 1 tablet (100 mcg total) by mouth daily before breakfast. 90 tablet 1   LORazepam  (ATIVAN) 0.5 MG tablet Take 0.5 mg by mouth at bedtime.     lovastatin (MEVACOR) 40 MG tablet Take 1 tablet (40 mg total) by mouth at bedtime. 90 tablet 1   methocarbamol (ROBAXIN) 500 MG tablet Take 500 mg by mouth every 8 (eight) hours as needed for muscle spasms.     metoprolol tartrate (LOPRESSOR) 25 MG tablet Take 12.5 mg by mouth 2 (two) times daily.     mineral oil-hydrophilic petrolatum (AQUAPHOR) ointment Apply 1 application. topically in the morning, at noon, and at bedtime. Apply to buttocks and crease of buttocks for redness and irritation     MUCINEX  600 MG 12 hr tablet Take 600 mg by mouth 2 (two) times daily.     nystatin (MYCOSTATIN/NYSTOP) powder Apply 1 application  topically 3 (three) times daily. Under breasts and groin area     OLANZapine (ZYPREXA) 2.5 MG tablet Take 2.5 mg by mouth daily with supper.     oxybutynin (DITROPAN-XL) 5 MG 24 hr tablet Take 5 mg by mouth at bedtime.     pantoprazole (PROTONIX) 40 MG tablet TAKE 1 TABLET DAILY 90 tablet 0   polyvinyl alcohol (LIQUIFILM TEARS) 1.4 % ophthalmic solution Place 1 drop into both eyes as needed for dry eyes.     potassium chloride 20 MEQ/15ML (10%) SOLN Take 20 mEq by mouth daily.     rivaroxaban (XARELTO) 20 MG TABS tablet Take 20 mg by mouth daily.     traMADol (ULTRAM) 50 MG tablet Take 50 mg by mouth every 6 (six) hours as needed for moderate pain.     vitamin C (ASCORBIC ACID) 250 MG tablet Take 250 mg by mouth daily.     No current facility-administered medications for this visit.    PAST MEDICAL HISTORY: Past Medical History:  Diagnosis Date   Anxiety    Cancer (HCC)    malignant thyroid   CHF (congestive heart failure) (HCC)    hx of 09/2020 per note   Conversion disorder with seizures or convulsions    Depression    Diverticulosis    DVT (deep venous thrombosis) (HCC)    right   Dyspnea    Dysrhythmia    hx of SVT- 01/14/21 per note   Essential hypertension    Fracture of right humerus    GERD  (gastroesophageal reflux disease)    Hiatal hernia    Hiatal hernia    Humerus fracture    right   Hx of falling    Hyperlipidemia    Hypothyroidism    Mental developmental delay    OCD (obsessive compulsive disorder)    Osteoporosis    Peptic ulcer    Pneumonia    hx of pneumonal several times per sister at preop   Thyroid disease    Thyroid nodule     PAST SURGICAL HISTORY: Past Surgical History:  Procedure Laterality Date   BIOPSY  11/02/2019   Procedure: BIOPSY;  Surgeon: Iva Boop, MD;  Location: [facility];  Service: Endoscopy;;   COLONOSCOPY WITH PROPOFOL N/A 11/02/2019   Procedure: COLONOSCOPY WITH PROPOFOL;  Surgeon: Iva Boop, MD;  Location: [facility];  Service: Endoscopy;  Laterality: N/A;   ESOPHAGOGASTRODUODENOSCOPY (EGD) WITH PROPOFOL N/A 11/01/2019   Procedure: ESOPHAGOGASTRODUODENOSCOPY (EGD) WITH PROPOFOL;  Surgeon: Iva Boop, MD;  [facility];  Service: Endoscopy;  Laterality: N/A;   HUMERUS FRACTURE SURGERY Right 08/13/2020   THYROID LOBECTOMY Right 12/21/2021   Procedure: RIGHT THYROID LOBECTOMY;  Surgeon: Darnell Level, MD;  Location: [facility];  Service: General;  Laterality: Right;   TONSILLECTOMY      FAMILY HISTORY: Family History  Problem Relation Age of Onset   GI problems Mother    Hypertension Mother    Hyperlipidemia Mother    Heart disease Mother    Diabetes Father    Hyperlipidemia Father    Hypertension Father    Atrial fibrillation Father    Heart attack Father    Osteoporosis Father    Colon polyps Father    Hyperlipidemia Sister    Dysphagia Sister    Heart disease Maternal Grandfather    Colon  cancer Maternal Grandfather    Diabetes Paternal Grandmother    Heart disease Paternal Grandmother     SOCIAL HISTORY: Social History   Socioeconomic History   Marital status: Single    Spouse name: Not on file   Number of children: 0   Years of education: 12   Highest education level: 12th grade   Occupational History   Occupation: retired    Comment: Public librarian  Tobacco Use   Smoking status: Never   Smokeless tobacco: Never  Vaping Use   Vaping Use: Never used  Substance and Sexual Activity   Alcohol use: No   Drug use: No   Sexual activity: Never  Other Topics Concern   Not on file  Social History Narrative   Not on file   Social Determinants of Health   Financial Resource Strain: Low Risk  (07/04/2017)   Overall Financial Resource Strain (CARDIA)    Difficulty of Paying Living Expenses: Not hard at all  Food Insecurity: No Food Insecurity (07/04/2017)   Hunger Vital Sign    Worried About Running Out of Food in the Last Year: Never true    Ran Out of Food in the Last Year: Never true  Transportation Needs: No Transportation Needs (07/04/2017)   PRAPARE - Administrator, Civil Service (Medical): No    Lack of Transportation (Non-Medical): No  Physical Activity: Insufficiently Active (01/23/2018)   Exercise Vital Sign    Days of Exercise per Week: 3 days    Minutes of Exercise per Session: 30 min  Stress: No Stress Concern Present (07/04/2017)   [employer] of Occupational Health - Occupational Stress Questionnaire    Feeling of Stress : Not at all  Social Connections: Moderately Integrated (07/04/2017)   Social Connection and Isolation Panel [NHANES]    Frequency of Communication with Friends and Family: More than three times a week    Frequency of Social Gatherings with Friends and Family: More than three times a week    Attends Religious Services: More than 4 times per year    Active Member of [employer] or Organizations: Yes    Attends Banker Meetings: More than 4 times per year    Marital Status: Never married  Intimate Partner Violence: Not At Risk (01/23/2018)   Humiliation, Afraid, Rape, and Kick questionnaire    Fear of Current or Ex-Partner: No    Emotionally Abused: No    Physically Abused: No    Sexually Abused: No       Levert Feinstein, M.D. Ph.D.  Ms [facility] [address] [place], [place] [address] Ph: [phone] Fax: [phone]  CC:  Renee Monday, MD [address] [place],  [place] [address]  Renee Monday, MD

## 2023-01-26 NOTE — Telephone Encounter (Signed)
Referral faxed to [facility]: Phone: [phone]  Fax: [phone] 

## 2023-01-27 NOTE — Procedures (Signed)
Full Name: Renee Davidson [facility] Gender: Female MRN #: [id number] Date of Birth: Jan 27, 1951    Visit Date: 01/26/2023 14:37 Age: 72 Years Examining Physician: Dr. Levert Feinstein Referring Physician: Dr. Windell Norfolk Height: 5 feet 5 inch History: 72 year old female presenting with gait abnormalities, multiple falls On examination: Patient has obesity, in wheelchair, significant pitting edema of bilateral lower extremity, would not be able to register reliable nerve conduction signal, does complains of right shoulder weakness, intermittent hands paresthesia  Summary of the test: Nerve conduction study: Lower extremity nerve conduction study was not performed due to lower extremity edema, difficulty positioning patient,  Bilateral ulnar sensory responses showed mildly prolonged peak latency with normal snap amplitude  Bilateral median sensory response showed mildly prolonged peak latency with normal snap amplitude.  Left radial sensory response was normal.  Left ulnar motor responses were normal.  Right ulnar motor response had technical difficulties due to elevated right shoulder pain and difficulty positioning, had suboptimal stimulation at above elbow side, were otherwise within normal limit.  Right median motor responses showed borderline prolonged distal latency within normal range snap amplitude.  Left median motor responses were normal.    Electromyography: Patient was not able to tolerate needle examination, and a brief needle examination of left tibialis anterior showed no significant abnormalities.  Conclusion: This is a slight abnormal yet technically difficult study due to patient's obesity, difficulty to positioning sitting in the wheelchair, and patient's cooperation.  There is electrodiagnostic evidence of moderate right median neuropathy across elbow consistent with moderate right carpal tunnel syndromes.    ------------------------------- Levert Feinstein M.D.  PhD  [facility] [address] [place], [place] [address] Tel: [phone] Fax: [phone]  Verbal informed consent was obtained from the patient, patient was informed of potential risk of procedure, including bruising, bleeding, hematoma formation, infection, muscle weakness, muscle pain, numbness, among others.        MNC    Nerve / Sites Muscle Latency Ref. Amplitude Ref. Rel Amp Segments Distance Velocity Ref. Area    ms ms mV mV %  cm m/s m/s mVms  L Median - APB     Wrist APB 3.8 ?4.4 4.3 ?4.0 100 Wrist - APB 7   18.2     Upper arm APB 7.6  5.6  130 Upper arm - Wrist 22 59 ?49 24.1  R Median - APB     Wrist APB 4.4 ?4.4 5.7 ?4.0 100 Wrist - APB 7   24.5     Upper arm APB 8.2  6.1  106 Upper arm - Wrist 20 52 ?49 25.8  L Ulnar - ADM     Wrist ADM 2.8 ?3.3 5.3 ?6.0 100 Wrist - ADM 7   29.9     B.Elbow ADM 4.5  5.4  102 B.Elbow - Wrist 11 67 ?49 29.1     A.Elbow ADM 8.3  6.2  114 A.Elbow - B.Elbow 16 42 ?49 31.9  R Ulnar - ADM     Wrist ADM 2.9 ?3.3 7.7 ?6.0 100 Wrist - ADM 7   34.6     B.Elbow ADM 4.5  8.1  105 B.Elbow - Wrist 13 81 ?49 35.0     A.Elbow ADM 12.5  0.3  3.33 A.Elbow - B.Elbow   ?49 0.4             SNC    Nerve / Sites Rec. Site Peak Lat Ref.  Amp Ref. Segments Distance  ms ms V V  cm  L Radial - Anatomical snuff box (Forearm)     Forearm Wrist 2.1 ?2.9 28 ?15 Forearm - Wrist 10  L Median - Orthodromic (Dig II, Mid palm)     Dig II Wrist 3.4 ?3.4 27 ?10 Dig II - Wrist 13  R Median - Orthodromic (Dig II, Mid palm)     Dig II Wrist 3.7 ?3.4 17 ?10 Dig II - Wrist 13  L Ulnar - Orthodromic, (Dig V, Mid palm)     Dig V Wrist 3.4 ?3.1 10 ?5 Dig V - Wrist 11  R Ulnar - Dig V     Forearm Hand dorsum 2.9 ?2.5 8 ?8 Forearm - Hand dorsum 8               F  Wave    Nerve F Lat Ref.   ms ms  L Ulnar - ADM 27.3 ?32.0  R Ulnar - ADM 26.5 ?32.0         EMG Summary Table    Spontaneous MUAP Recruitment  Muscle IA Fib PSW Fasc  Other Amp Dur. Poly Pattern  R. Tibialis anterior Normal None None None _______ Normal Normal Normal Normal  R. Tibialis posterior Normal None None None _______ Normal Normal Normal Normal

## 2023-02-02 ENCOUNTER — Telehealth: Payer: Self-pay

## 2023-02-02 NOTE — Telephone Encounter (Signed)
I left a voicemail for the patient requesting a return call, if needed. The patient has seen the results on mychart.

## 2023-02-02 NOTE — Telephone Encounter (Signed)
-----   Message from April Karsten Ro, RN sent at 01/31/2023  8:33 AM EDT -----  ----- Message ----- From: Asa Lente, MD Sent: 01/27/2023   5:06 PM EDT To: April Karsten Ro, RN  The EMG showed moderate carpal tunnel syndrome.  We could refer to an orthopedic hand surgeon. ----- Message ----- From: Lenn Cal, RN Sent: 01/27/2023  11:13 AM EDT To: Asa Lente, MD  Please review as work in for Dr. Teresa Coombs  ----- Message ----- From: Levert Feinstein, MD Sent: 01/27/2023  11:10 AM EDT To: Windell Norfolk, MD

## 2023-03-24 ENCOUNTER — Other Ambulatory Visit (HOSPITAL_COMMUNITY): Payer: Self-pay | Admitting: Urology

## 2023-03-24 DIAGNOSIS — R339 Retention of urine, unspecified: Secondary | ICD-10-CM

## 2023-03-25 ENCOUNTER — Encounter: Payer: Self-pay | Admitting: General Practice

## 2023-03-25 NOTE — Progress Notes (Signed)
Berdine Dance, MD  Caroleen Hamman, NT Ok for CT SP tube placement at [facility] or [facility]  TS       Previous Messages    ----- Message ----- From: Caroleen Hamman, NT Sent: 03/24/2023   1:43 PM EDT To: Ir Procedure Requests Subject: CT GUIDED SUPERPUBIC CATHETER PLMT            Procedure: CT GUIDED SUPERPUBIC CATHETER PLMT  Reason: Urinary retention  History: CT in chart  Provider: Crista Elliot, MD  Contact: [phone]

## 2023-03-30 ENCOUNTER — Other Ambulatory Visit: Payer: Self-pay | Admitting: Radiology

## 2023-03-31 ENCOUNTER — Ambulatory Visit (HOSPITAL_COMMUNITY)
Admission: RE | Admit: 2023-03-31 | Discharge: 2023-03-31 | Disposition: A | Discharge status: Home / Self Care | Payer: Medicare Other | Source: Ambulatory Visit | Attending: Urology | Admitting: Urology

## 2023-03-31 ENCOUNTER — Other Ambulatory Visit: Payer: Self-pay

## 2023-03-31 ENCOUNTER — Encounter (HOSPITAL_COMMUNITY): Payer: Self-pay

## 2023-03-31 DIAGNOSIS — N319 Neuromuscular dysfunction of bladder, unspecified: Secondary | ICD-10-CM | POA: Diagnosis present

## 2023-03-31 DIAGNOSIS — R339 Retention of urine, unspecified: Secondary | ICD-10-CM | POA: Insufficient documentation

## 2023-03-31 MED ORDER — FENTANYL CITRATE (PF) 100 MCG/2ML IJ SOLN
INTRAMUSCULAR | Status: AC | PRN
  Administered 2023-03-31: 25 ug via INTRAVENOUS

## 2023-03-31 MED ORDER — SODIUM CHLORIDE 0.9 % IV SOLN: INTRAVENOUS | Status: DC

## 2023-03-31 MED ORDER — VANCOMYCIN HCL IN DEXTROSE 1-5 GM/200ML-% IV SOLN
INTRAVENOUS | Status: AC | PRN
Start: 2023-03-31 — End: 2023-03-31
  Administered 2023-03-31: 1000 mg via INTRAVENOUS

## 2023-03-31 MED ORDER — MIDAZOLAM HCL 2 MG/2ML IJ SOLN
INTRAMUSCULAR | Status: AC
  Filled 2023-03-31: qty 4

## 2023-03-31 MED ORDER — SODIUM CHLORIDE 0.9% FLUSH: 5.0000 mL | Freq: Three times a day (TID) | INTRAVENOUS | Status: DC

## 2023-03-31 MED ORDER — MIDAZOLAM HCL 2 MG/2ML IJ SOLN
INTRAMUSCULAR | Status: AC | PRN
  Administered 2023-03-31: .5 mg via INTRAVENOUS

## 2023-03-31 MED ORDER — FENTANYL CITRATE (PF) 100 MCG/2ML IJ SOLN
INTRAMUSCULAR | Status: AC
  Filled 2023-03-31: qty 4

## 2023-03-31 MED ORDER — VANCOMYCIN HCL IN DEXTROSE 1-5 GM/200ML-% IV SOLN
INTRAVENOUS | Status: AC
  Filled 2023-03-31: qty 200

## 2023-03-31 NOTE — Procedures (Signed)
Vascular and Interventional Radiology Procedure Note  Patient: Renee Davidson Herald DOB: 11-Apr-1951 Medical Record Number: [id number] Note Date/Time: 03/31/23 10:03 AM   Performing Physician: Roanna Banning, MD Assistant(s): None  Diagnosis: Neurogenic bladder  Procedure: SUPRAPUBIC CYSTOSTOMY TUBE PLACEMENT  Anesthesia: Conscious Sedation Complications: None Estimated Blood Loss: Minimal Specimens: Sent for None   Plan:  - Flush drain with 10 mL Normal Saline every 8 hours. - Follow up drain upsize to 72F Council tip foley in 4-6 week(s).  See detailed procedure note with images in PACS. The patient tolerated the procedure well without incident or complication and was returned to Recovery in stable condition.    Roanna Banning, MD [facility]   Pager. [phone] Clinic. [phone]

## 2023-03-31 NOTE — H&P (Signed)
Chief Complaint: Patient was seen in consultation today for supra pubic catheter placement at the request of Bell,Eugene D III  Referring Physician(s): Ray Church III  Supervising Physician: Roanna Banning  Patient Status: [facility] - Out-pt  History of Present Illness: Renee Davidson is a 72 y.o. female   FULL Code status per pt  Pt with C4-5 spinal stenosis Neuropathy; frequent falls Living in a SNF Obesity Follows with Neurology Dr Brayton El  Urinary retention Long Hx UTIs Foley catheter in place per Dr Alvester Morin  Scheduled today for supra pubic catheter per Dr Alvester Morin request     Past Medical History:  Diagnosis Date   Anxiety    Cancer [facility])    malignant thyroid   CHF (congestive heart failure) (HCC)    hx of 09/2020 per note   Conversion disorder with seizures or convulsions    Depression    Diverticulosis    DVT (deep venous thrombosis) (HCC)    right   Dyspnea    Dysrhythmia    hx of SVT- 01/14/21 per note   Essential hypertension    Fracture of right humerus    GERD (gastroesophageal reflux disease)    Hiatal hernia    Hiatal hernia    Humerus fracture    right   Hx of falling    Hyperlipidemia    Hypothyroidism    Mental developmental delay    OCD (obsessive compulsive disorder)    Osteoporosis    Peptic ulcer    Pneumonia    hx of pneumonal several times per sister at preop   Thyroid disease    Thyroid nodule     Past Surgical History:  Procedure Laterality Date   BIOPSY  11/02/2019   Procedure: BIOPSY;  Surgeon: Iva Boop, MD;  Location: [facility]OS[facility];  Service: Endoscopy;;   COLONOSCOPY WITH PROPOFOL N/A 11/02/2019   Procedure: COLONOSCOPY WITH PROPOFOL;  Surgeon: Iva Boop, MD;  Location: [facility] ENDOSCOPY;  Service: Endoscopy;  Laterality: N/A;   ESOPHAGOGASTRODUODENOSCOPY (EGD) WITH PROPOFOL N/A 11/01/2019   Procedure: ESOPHAGOGASTRODUODENOSCOPY (EGD) WITH PROPOFOL;  Surgeon: Iva Boop, MD;  Location: [facility] ENDOSCOPY;  Service:  Endoscopy;  Laterality: N/A;   HUMERUS FRACTURE SURGERY Right 08/13/2020   THYROID LOBECTOMY Right 12/21/2021   Procedure: RIGHT THYROID LOBECTOMY;  Surgeon: Darnell Level, MD;  Location: [facility] ORS;  Service: General;  Laterality: Right;   TONSILLECTOMY      Allergies: Keflex [cephalexin], Sulfamethoxazole, and Doxycycline  Medications: Prior to Admission medications   Medication Sig Start Date End Date Taking? Authorizing Provider  LORazepam (ATIVAN) 0.5 MG tablet Take 0.5 mg by mouth at bedtime.   Yes [provider]  metoprolol tartrate (LOPRESSOR) 25 MG tablet Take 12.5 mg by mouth 2 (two) times daily. 05/13/21  Yes [provider]  Acetaminophen 500 MG capsule Take 1,000 mg by mouth in the morning, at noon, and at bedtime.    [provider]  albuterol (VENTOLIN HFA) 108 (90 Base) MCG/ACT inhaler Inhale into the lungs 2 (two) times daily as needed for wheezing or shortness of breath.    [provider]  allopurinol (ZYLOPRIM) 100 MG tablet Take 100 mg by mouth daily. 05/18/21   [provider]  aspirin 81 MG EC tablet Take 81 mg by mouth daily. Swallow whole.    [provider]  benzonatate (TESSALON) 100 MG capsule Take 1 capsule (100 mg total) by mouth 3 (three) times daily as needed for cough. 11/08/22   Clent Ridges,  Earnstine Regal, NP  cetirizine (ZYRTEC) 10 MG tablet Take 10 mg by mouth daily. Patient not taking: Reported on 01/20/2023 10/13/22   [provider]  dextromethorphan (DELSYM) 30 MG/5ML liquid Take 60 mg by mouth every 12 (twelve) hours as needed for cough.    [provider]  Dextromethorphan-guaiFENesin 10-100 MG/5ML liquid Take 10 mLs by mouth daily as needed (cough).    [provider]  diclofenac Sodium (VOLTAREN) 1 % GEL Apply 1 application  topically in the morning, at noon, and at bedtime.    [provider]  docusate sodium (COLACE) 100 MG capsule Take 100 mg by mouth daily.    [provider]  escitalopram (LEXAPRO) 20 MG tablet Take 1 tablet (20 mg total) by mouth daily. 02/22/20   [name] Serra-Margaret, FNP  ferrous sulfate 325 (65 FE) MG tablet Take 1 tablet (325 mg total) by mouth daily with breakfast. 02/22/20   [name], Ameyah-Margaret, FNP  furosemide (LASIX) 20 MG tablet Take 20 mg by mouth daily. 05/25/21   [provider]  glycerin adult 2 g suppository Place 1 suppository rectally as needed for constipation. 01/16/23   Ernie Avena, MD  hydrochlorothiazide (MICROZIDE) 12.5 MG capsule Take 12.5 mg by mouth daily. 07/18/21   [provider]  hydrocortisone cream 1 % Apply 1 Application topically 3 (three) times daily.    [provider]  ipratropium-albuterol (DUONEB) 0.5-2.5 (3) MG/3ML SOLN Take 3 mLs by nebulization every 6 (six) hours as needed.    [provider]  levothyroxine (SYNTHROID) 100 MCG tablet Take 1 tablet (100 mcg total) by mouth daily before breakfast. 01/20/23   [name], Denman George, MD  lovastatin (MEVACOR) 40 MG tablet Take 1 tablet (40 mg total) by mouth at bedtime. 12/04/19   [name], Arienne-Margaret, FNP  methocarbamol (ROBAXIN) 500 MG tablet Take 500 mg by mouth every 8 (eight) hours as needed for muscle spasms. 07/31/21   [provider]  mineral oil-hydrophilic petrolatum (AQUAPHOR) ointment Apply 1 application. topically in the morning, at noon, and at bedtime. Apply to buttocks and crease of buttocks for redness and irritation    [provider]  MUCINEX 600 MG 12 hr tablet Take 600 mg by mouth 2 (two) times daily. 06/22/21   [provider]  nystatin (MYCOSTATIN/NYSTOP) powder Apply 1 application  topically 3 (three) times daily. Under breasts and groin area 06/01/21   [provider]  OLANZapine (ZYPREXA) 2.5 MG tablet Take 2.5 mg by mouth daily with supper.    [provider]  oxybutynin (DITROPAN-XL) 5 MG 24 hr tablet Take 5 mg by mouth at bedtime.    [provider]  pantoprazole (PROTONIX) 40 MG tablet TAKE 1 TABLET DAILY 07/21/20   [name], Cambelle-Margaret, FNP  polyvinyl alcohol (LIQUIFILM TEARS) 1.4 % ophthalmic solution Place 1 drop into both eyes as needed for dry eyes.    [provider]  potassium chloride 20 MEQ/15ML (10%) SOLN Take 20 mEq by mouth daily. 08/25/21   [provider]  rivaroxaban (XARELTO) 20 MG TABS tablet Take 20 mg by mouth daily. 04/30/21   [provider]  traMADol (ULTRAM) 50 MG tablet Take 50 mg by mouth every 6 (six) hours as needed for moderate pain.    [provider]  vitamin C (ASCORBIC ACID) 250 MG tablet Take 250 mg by mouth daily.    [provider]     Family History  Problem Relation Age of Onset   GI problems  Mother    Hypertension Mother    Hyperlipidemia Mother    Heart disease Mother    Diabetes Father    Hyperlipidemia Father    Hypertension Father    Atrial fibrillation Father    Heart attack Father    Osteoporosis Father    Colon polyps Father    Hyperlipidemia Sister    Dysphagia Sister    Heart disease Maternal Grandfather    Colon cancer Maternal Grandfather    Diabetes Paternal Grandmother    Heart disease Paternal Grandmother     Social History   Socioeconomic History   Marital status: Single    Spouse name: Not on file   Number of children: 0   Years of education: 12   Highest education level: 12th grade  Occupational History   Occupation: retired    Comment: Public librarian  Tobacco Use   Smoking status: Never   Smokeless tobacco: Never  Vaping Use   Vaping status: Never Used  Substance and Sexual Activity   Alcohol use: No   Drug use: No   Sexual activity: Never  Other Topics Concern   Not on file  Social History Narrative   Not on file   Social Determinants of Health   Financial Resource Strain: Low Risk  (12/21/2022)   Received from [facility], [facility]   Overall Financial Resource Strain (CARDIA)     Difficulty of Paying Living Expenses: Not hard at all  Food Insecurity: No Food Insecurity (12/21/2022)   Received from [facility], [facility]   Hunger Vital Sign    Worried About Running Out of Food in the Last Year: Never true    Ran Out of Food in the Last Year: Never true  Transportation Needs: No Transportation Needs (12/21/2022)   Received from [employer], [facility]   PRAPARE - Transportation    Lack of Transportation (Medical): No    Lack of Transportation (Non-Medical): No  Physical Activity: Unknown (12/21/2022)   Received from [facility], [facility]   Exercise Vital Sign    Days of Exercise per Week: 0 days    Minutes of Exercise per Session: Not on file  Stress: No Stress Concern Present (12/21/2022)   Received from [facility], [facility] - Occupational Stress Questionnaire    Feeling of Stress : Not at all  Social Connections: Somewhat Isolated (12/21/2022)   Received from [facility], [facility]   Social Network    How would you rate your social network (family, work, friends)?: Restricted participation with some degree of social isolation    Review of Systems: A 12 point ROS discussed and pertinent positives are indicated in the HPI above.  All other systems are negative.  Review of Systems  Constitutional:  Negative for activity change and fever.  Respiratory:  Negative for cough and shortness of breath.   Cardiovascular:  Negative for chest pain.  Gastrointestinal:  Negative for abdominal pain.  Psychiatric/Behavioral:  Negative for behavioral problems and confusion.     Vital Signs: Pulse 78   Temp 98.3 F (36.8 C) (Oral)   Resp 16   Ht 5\' 5"  (1.651 m)   Wt 260 lb (117.9 kg)   BMI 43.27 kg/m   Advance Care Plan: The advanced care plan/surrogate decision maker was discussed at the time of visit and documented in the medical record.    Physical Exam Vitals reviewed.  HENT:      Mouth/Throat:  Mouth: Mucous membranes are moist.  Cardiovascular:     Rate and Rhythm: Normal rate and regular rhythm.     Heart sounds: Normal heart sounds.  Pulmonary:     Breath sounds: Normal breath sounds. No wheezing.  Abdominal:     Palpations: Abdomen is soft.  Musculoskeletal:        General: Normal range of motion.  Skin:    General: Skin is warm.  Neurological:     Mental Status: She is alert and oriented to person, place, and time.  Psychiatric:        Behavior: Behavior normal.     Imaging: No results found.  Labs:  CBC: Recent Labs    11/08/22 1037 11/28/22 2006 12/08/22 1126 01/16/23 0911  WBC 10.0 5.1 7.9 5.8  HGB 14.3 13.6 14.0 13.0  HCT 42.8 41.9 44.0 39.5  PLT 337.0 241 237 219    COAGS: No results for input(s): "INR", "APTT" in the last 8760 hours.  BMP: Recent Labs    11/08/22 1037 11/28/22 2006 12/08/22 1126 01/16/23 0911  NA 136 139 136 136  K 4.3 3.5 4.1 3.8  CL 96 100 99 98  CO2 29 31 27 30   GLUCOSE 95 97 107* 106*  BUN 15 16 15 11   CALCIUM 9.6 9.1 9.0 9.3  CREATININE 1.11 0.98 1.02* 0.83  GFRNONAA  --  >60 58* >60    LIVER FUNCTION TESTS: Recent Labs    11/28/22 2006 12/08/22 1126 01/16/23 0911  BILITOT 0.4 0.5 0.5  AST 15 20 14*  ALT 9 16 9   ALKPHOS 101 93 98  PROT 7.0 7.1 6.8  ALBUMIN 4.3 3.9 4.0    TUMOR MARKERS: No results for input(s): "AFPTM", "CEA", "CA199", "CHROMGRNA" in the last 8760 hours.  Assessment and Plan:  Scheduled for supra pubic catheter placement Pt and sister are aware of procedure benefits and risks including but not limited to Infection; bleeding; damage to surrounding structures Agreeable to proceed; consent signed and in chart  Thank you for this interesting consult.  I greatly enjoyed meeting TUNYA JOAQUIN and look forward to participating in their care.  A copy of this report was sent to the requesting provider on this date.  Electronically Signed: Robet Leu,  PA-C 03/31/2023, 8:55 AM   I spent a total of  30 Minutes   in face to face in clinical consultation, greater than 50% of which was counseling/coordinating care for supra pubic catheter placement

## 2023-04-06 ENCOUNTER — Emergency Department (HOSPITAL_COMMUNITY)
Admission: EM | Admit: 2023-04-06 | Discharge: 2023-04-06 | Disposition: A | Discharge status: Home / Self Care | Payer: Medicare Other | Attending: Emergency Medicine | Admitting: Emergency Medicine

## 2023-04-06 ENCOUNTER — Other Ambulatory Visit: Payer: Self-pay

## 2023-04-06 ENCOUNTER — Emergency Department (HOSPITAL_COMMUNITY): Payer: Medicare Other

## 2023-04-06 DIAGNOSIS — E860 Dehydration: Secondary | ICD-10-CM | POA: Insufficient documentation

## 2023-04-06 DIAGNOSIS — R1030 Lower abdominal pain, unspecified: Secondary | ICD-10-CM | POA: Diagnosis not present

## 2023-04-06 DIAGNOSIS — T83510A Infection and inflammatory reaction due to cystostomy catheter, initial encounter: Secondary | ICD-10-CM | POA: Diagnosis present

## 2023-04-06 DIAGNOSIS — Z7982 Long term (current) use of aspirin: Secondary | ICD-10-CM | POA: Diagnosis not present

## 2023-04-06 DIAGNOSIS — N390 Urinary tract infection, site not specified: Secondary | ICD-10-CM

## 2023-04-06 DIAGNOSIS — F0390 Unspecified dementia without behavioral disturbance: Secondary | ICD-10-CM | POA: Diagnosis not present

## 2023-04-06 DIAGNOSIS — Z7901 Long term (current) use of anticoagulants: Secondary | ICD-10-CM | POA: Insufficient documentation

## 2023-04-06 LAB — URINALYSIS, W/ REFLEX TO CULTURE (INFECTION SUSPECTED)
Bacteria, UA: NONE SEEN
Bilirubin Urine: NEGATIVE
Glucose, UA: NEGATIVE mg/dL
Ketones, ur: 20 mg/dL — AB
Nitrite: NEGATIVE
Protein, ur: 30 mg/dL — AB
RBC / HPF: >50 RBC/hpf (ref 0–5)
Specific Gravity, Urine: >1.046 — ABNORMAL HIGH (ref 1.005–1.030)
pH: 7 (ref 5.0–8.0)

## 2023-04-06 LAB — CBC WITH DIFFERENTIAL/PLATELET
Abs Immature Granulocytes: 0.06 10*3/uL (ref 0.00–0.07)
Basophils Absolute: 0 10*3/uL (ref 0.0–0.1)
Basophils Relative: 1 %
Eosinophils Absolute: 0.1 10*3/uL (ref 0.0–0.5)
Eosinophils Relative: 1 %
HCT: 42.8 % (ref 36.0–46.0)
Hemoglobin: 13.6 g/dL (ref 12.0–15.0)
Immature Granulocytes: 1 %
Lymphocytes Relative: 15 %
Lymphs Abs: 1.2 10*3/uL (ref 0.7–4.0)
MCH: 29.6 pg (ref 26.0–34.0)
MCHC: 31.8 g/dL (ref 30.0–36.0)
MCV: 93 fL (ref 80.0–100.0)
Monocytes Absolute: 0.9 10*3/uL (ref 0.1–1.0)
Monocytes Relative: 11 %
Neutro Abs: 6 10*3/uL (ref 1.7–7.7)
Neutrophils Relative %: 71 %
Platelets: 210 10*3/uL (ref 150–400)
RBC: 4.6 MIL/uL (ref 3.87–5.11)
RDW: 13.6 % (ref 11.5–15.5)
WBC: 8.4 10*3/uL (ref 4.0–10.5)
nRBC: 0 % (ref 0.0–0.2)

## 2023-04-06 LAB — COMPREHENSIVE METABOLIC PANEL
ALT: 14 U/L (ref 0–44)
AST: 15 U/L (ref 15–41)
Albumin: 3.4 g/dL — ABNORMAL LOW (ref 3.5–5.0)
Alkaline Phosphatase: 84 U/L (ref 38–126)
Anion gap: 11 (ref 5–15)
BUN: 12 mg/dL (ref 8–23)
CO2: 28 mmol/L (ref 22–32)
Calcium: 8.5 mg/dL — ABNORMAL LOW (ref 8.9–10.3)
Chloride: 97 mmol/L — ABNORMAL LOW (ref 98–111)
Creatinine, Ser: 0.91 mg/dL (ref 0.44–1.00)
GFR, Estimated: >60 mL/min (ref >60)
Glucose, Bld: 107 mg/dL — ABNORMAL HIGH (ref 70–99)
Potassium: 4.2 mmol/L (ref 3.5–5.1)
Sodium: 136 mmol/L (ref 135–145)
Total Bilirubin: 0.7 mg/dL (ref 0.3–1.2)
Total Protein: 7.3 g/dL (ref 6.5–8.1)

## 2023-04-06 MED ORDER — FENTANYL CITRATE PF 50 MCG/ML IJ SOSY
50.0000 ug | PREFILLED_SYRINGE | Freq: Once | INTRAMUSCULAR | Status: AC
  Administered 2023-04-06: 50 ug via INTRAVENOUS
  Filled 2023-04-06: qty 1

## 2023-04-06 MED ORDER — CIPROFLOXACIN HCL 500 MG PO TABS: 500.0000 mg | ORAL_TABLET | Freq: Two times a day (BID) | ORAL | 0 refills | Status: AC

## 2023-04-06 MED ORDER — CIPROFLOXACIN IN D5W 400 MG/200ML IV SOLN
400.0000 mg | Freq: Once | INTRAVENOUS | Status: AC
  Administered 2023-04-06: 400 mg via INTRAVENOUS
  Filled 2023-04-06: qty 200

## 2023-04-06 MED ORDER — IOHEXOL 300 MG/ML  SOLN
100.0000 mL | Freq: Once | INTRAMUSCULAR | Status: AC | PRN
  Administered 2023-04-06: 100 mL via INTRAVENOUS

## 2023-04-06 MED ORDER — LACTATED RINGERS IV BOLUS
1000.0000 mL | Freq: Once | INTRAVENOUS | Status: AC
  Administered 2023-04-06: 1000 mL via INTRAVENOUS

## 2023-04-06 NOTE — ED Notes (Signed)
Urine bag emptied. Awaiting new urine for sample.

## 2023-04-06 NOTE — ED Notes (Signed)
[facility] contacted to initiate transport

## 2023-04-06 NOTE — Discharge Instructions (Signed)
You have been diagnosed with dehydration and a urinary tract infection.  Please complete your antibiotics as prescribed and stay well-hydrated.  Monitor your condition carefully and do not hesitate to return here for additional changes in your condition.

## 2023-04-06 NOTE — ED Provider Notes (Signed)
[place] EMERGENCY DEPARTMENT AT [facility] Provider Note   CSN: [id number] Arrival date & time: 04/06/23  0940     History  Chief Complaint  Patient presents with   post-op dysuria    Renee Davidson is a 72 y.o. female.  HPI Patient presents with her sister who assists with the history. Patient has dementia, as well as developmental delay.  She presents with abdominal pain, decreased urine production.  History is notable for long-term Foley catheter use now with placement of suprapubic catheter 4 days ago.  Patient describes abdominal pain is in the lower abdomen, though dementia somewhat prohibitive. No nursing reported fever, vomiting, fall.    Home Medications Prior to Admission medications   Medication Sig Start Date End Date Taking? Authorizing Provider  ciprofloxacin (CIPRO) 500 MG tablet Take 1 tablet (500 mg total) by mouth every 12 (twelve) hours. 04/06/23  Yes Gerhard Munch, MD  Acetaminophen 500 MG capsule Take 1,000 mg by mouth in the morning, at noon, and at bedtime.    [provider]  albuterol (VENTOLIN HFA) 108 (90 Base) MCG/ACT inhaler Inhale into the lungs 2 (two) times daily as needed for wheezing or shortness of breath.    [provider]  allopurinol (ZYLOPRIM) 100 MG tablet Take 100 mg by mouth daily. 05/18/21   [provider]  aspirin 81 MG EC tablet Take 81 mg by mouth daily. Swallow whole.    [provider]  benzonatate (TESSALON) 100 MG capsule Take 1 capsule (100 mg total) by mouth 3 (three) times daily as needed for cough. 11/08/22   Glenford Bayley, NP  cetirizine (ZYRTEC) 10 MG tablet Take 10 mg by mouth daily. Patient not taking: Reported on 01/20/2023 10/13/22   [provider]  dextromethorphan (DELSYM) 30 MG/5ML liquid Take 60 mg by mouth every 12 (twelve) hours as needed for cough.    [provider]  Dextromethorphan-guaiFENesin 10-100 MG/5ML liquid Take 10 mLs by mouth daily as  needed (cough).    [provider]  diclofenac Sodium (VOLTAREN) 1 % GEL Apply 1 application  topically in the morning, at noon, and at bedtime.    [provider]  docusate sodium (COLACE) 100 MG capsule Take 100 mg by mouth daily.    [provider]  escitalopram (LEXAPRO) 20 MG tablet Take 1 tablet (20 mg total) by mouth daily. 02/22/20   [name] Analina-Margaret, FNP  ferrous sulfate 325 (65 FE) MG tablet Take 1 tablet (325 mg total) by mouth daily with breakfast. 02/22/20   [name], [name], FNP  furosemide (LASIX) 20 MG tablet Take 20 mg by mouth daily. 05/25/21   [provider]  glycerin adult 2 g suppository Place 1 suppository rectally as needed for constipation. 01/16/23   Ernie Avena, MD  hydrochlorothiazide (MICROZIDE) 12.5 MG capsule Take 12.5 mg by mouth daily. 07/18/21   [provider]  hydrocortisone cream 1 % Apply 1 Application topically 3 (three) times daily.    [provider]  ipratropium-albuterol (DUONEB) 0.5-2.5 (3) MG/3ML SOLN Take 3 mLs by nebulization every 6 (six) hours as needed.    [provider]  levothyroxine (SYNTHROID) 100 MCG tablet Take 1 tablet (100 mcg total) by mouth daily before breakfast. 01/20/23   [name], Denman George, MD  LORazepam (ATIVAN) 0.5 MG tablet Take 0.5 mg by mouth at bedtime.    [provider]  lovastatin (MEVACOR) 40 MG tablet Take 1 tablet (40 mg total) by mouth at bedtime.  12/04/19   [name], Chessica-Margaret, FNP  methocarbamol (ROBAXIN) 500 MG tablet Take 500 mg by mouth every 8 (eight) hours as needed for muscle spasms. 07/31/21   [provider]  metoprolol tartrate (LOPRESSOR) 25 MG tablet Take 12.5 mg by mouth 2 (two) times daily. 05/13/21   [provider]  mineral oil-hydrophilic petrolatum (AQUAPHOR) ointment Apply 1 application. topically in the morning, at noon, and at bedtime. Apply to buttocks and crease of buttocks for redness and irritation     [provider]  MUCINEX 600 MG 12 hr tablet Take 600 mg by mouth 2 (two) times daily. 06/22/21   [provider]  nystatin (MYCOSTATIN/NYSTOP) powder Apply 1 application  topically 3 (three) times daily. Under breasts and groin area 06/01/21   [provider]  OLANZapine (ZYPREXA) 2.5 MG tablet Take 2.5 mg by mouth daily with supper.    [provider]  oxybutynin (DITROPAN-XL) 5 MG 24 hr tablet Take 5 mg by mouth at bedtime.    [provider]  pantoprazole (PROTONIX) 40 MG tablet TAKE 1 TABLET DAILY 07/21/20   [name], [name], FNP  polyvinyl alcohol (LIQUIFILM TEARS) 1.4 % ophthalmic solution Place 1 drop into both eyes as needed for dry eyes.    [provider]  potassium chloride 20 MEQ/15ML (10%) SOLN Take 20 mEq by mouth daily. 08/25/21   [provider]  rivaroxaban (XARELTO) 20 MG TABS tablet Take 20 mg by mouth daily. 04/30/21   [provider]  traMADol (ULTRAM) 50 MG tablet Take 50 mg by mouth every 6 (six) hours as needed for moderate pain.    [provider]  vitamin C (ASCORBIC ACID) 250 MG tablet Take 250 mg by mouth daily.    [provider]      Allergies    Keflex [cephalexin], Sulfamethoxazole, and Doxycycline    Review of Systems   Review of Systems  Unable to perform ROS: Dementia    Physical Exam Updated Vital Signs BP 100/76   Pulse 92   Temp 97.9 F (36.6 C) (Oral)   Resp 18   Ht 5\' 5"  (1.651 m)   Wt 118 kg   SpO2 95%   BMI 43.29 kg/m  Physical Exam Vitals and nursing note reviewed.  Constitutional:      General: She is not in acute distress.    Appearance: She is well-developed. She is obese. She is not ill-appearing or diaphoretic.  HENT:     Head: Normocephalic and atraumatic.  Eyes:     Conjunctiva/sclera: Conjunctivae normal.  Cardiovascular:     Rate and Rhythm: Normal rate and regular rhythm.     Pulses: Normal pulses.  Pulmonary:     Effort:  Pulmonary effort is normal. No respiratory distress.     Breath sounds: No stridor.  Abdominal:     Tenderness: There is abdominal tenderness.     Comments: Suprapubic catheter with trace erythema around the exit in the lower abdomen, tender throughout this region.  Skin:    General: Skin is warm and dry.  Neurological:     Mental Status: She is alert.     Cranial Nerves: No cranial nerve deficit.  Psychiatric:        Mood and Affect: Mood normal.        Behavior: Behavior is withdrawn.     ED Results / Procedures / Treatments   Labs (all labs ordered are listed, but only abnormal results are displayed) Labs Reviewed  COMPREHENSIVE METABOLIC  PANEL - Abnormal; Notable for the following components:      Result Value   Chloride 97 (*)    Glucose, Bld 107 (*)    Calcium 8.5 (*)    Albumin 3.4 (*)    All other components within normal limits  URINALYSIS, W/ REFLEX TO CULTURE (INFECTION SUSPECTED) - Abnormal; Notable for the following components:   Specific Gravity, Urine >1.046 (*)    Hgb urine dipstick LARGE (*)    Ketones, ur 20 (*)    Protein, ur 30 (*)    Leukocytes,Ua SMALL (*)    All other components within normal limits  URINE CULTURE  CBC WITH DIFFERENTIAL/PLATELET    EKG None  Radiology CT ABDOMEN PELVIS W CONTRAST  Result Date: 04/06/2023 CLINICAL DATA:  Acute generalized abdominal pain status post suprapubic catheter placement. EXAM: CT ABDOMEN AND PELVIS WITH CONTRAST TECHNIQUE: Multidetector CT imaging of the abdomen and pelvis was performed using the standard protocol following bolus administration of intravenous contrast. RADIATION DOSE REDUCTION: This exam was performed according to the departmental dose-optimization program which includes automated exposure control, adjustment of the mA and/or kV according to patient size and/or use of iterative reconstruction technique. CONTRAST:  OMNIPAQUE IOHEXOL 300 MG/ML  SOLN COMPARISON:  January 16, 2023. FINDINGS:  Lower chest: Moderate size sliding-type hiatal hernia. Visualized lung bases are unremarkable. Hepatobiliary: No focal liver abnormality is seen. No gallstones, gallbladder wall thickening, or biliary dilatation. Pancreas: Unremarkable. No pancreatic ductal dilatation or surrounding inflammatory changes. Spleen: Normal in size without focal abnormality. Adrenals/Urinary Tract: Adrenal glands and kidneys appear normal. No hydronephrosis or renal obstruction is noted. Suprapubic bladder catheter is noted in grossly good position. Urinary bladder is decompressed. Stomach/Bowel: There is no evidence of bowel obstruction or inflammation. The appendix appears normal. Sigmoid diverticulosis without inflammation. Vascular/Lymphatic: No significant vascular findings are present. No enlarged abdominal or pelvic lymph nodes. Reproductive: Uterus and bilateral adnexa are unremarkable. Other: No ascites or hernia is noted. Musculoskeletal: No acute or significant osseous findings. IMPRESSION: Moderate size sliding-type hiatal hernia. Suprapubic bladder catheter is noted in grossly good position. Urinary bladder is decompressed. Sigmoid diverticulosis without inflammation. Electronically Signed   By: Lupita Raider M.D.   On: 04/06/2023 11:46    Procedures Procedures    Medications Ordered in ED Medications  ciprofloxacin (CIPRO) IVPB 400 mg (400 mg Intravenous New Bag/Given 04/06/23 1245)  iohexol (OMNIPAQUE) 300 MG/ML solution 100 mL (100 mLs Intravenous Contrast Given 04/06/23 1047)  fentaNYL (SUBLIMAZE) injection 50 mcg (50 mcg Intravenous Given 04/06/23 1229)  lactated ringers bolus 1,000 mL (1,000 mLs Intravenous New Bag/Given 04/06/23 1305)    ED Course/ Medical Decision Making/ A&P                                 Medical Decision Making Obese elderly female with developmental delay, dementia recent placement of suprapubic catheter presents with abdominal pain, decreased urine production. Concern for infection  versus decreased p.o. intake contributing to her decreased output. Cardiac 50 sinus normal Pulse ox 99% room air normal   Amount and/or Complexity of Data Reviewed Independent Historian:     Details: Sister at bedside External Data Reviewed: notes. Labs: ordered. Decision-making details documented in ED Course. Radiology: ordered and independent interpretation performed. Decision-making details documented in ED Course.  Risk Prescription drug management. Decision regarding hospitalization. Diagnosis or treatment significantly limited by social determinants of health.   1:11 PM Exam  patient is smiling, awake, alert, in no distress.  Labs reviewed, discussed, CT as well. Patient's labs with ketone urea and CT with compressed bladder, as well as her decreased urine output suggests dehydration contributing to her discomfort.  No fever, no leukocytosis, but the patient does have leukocytes, it was some consideration of a periprocedural infection patient will start Cipro as she is allergic to other medications. With otherwise reassuring findings, no evidence for bacteremia, sepsis, patient will return to her nursing facility.        Final Clinical Impression(s) / ED Diagnoses Final diagnoses:  Urinary tract infection associated with cystostomy catheter, initial encounter (HCC)  Dehydration    Rx / DC Orders ED Discharge Orders          Ordered    ciprofloxacin (CIPRO) 500 MG tablet  Every 12 hours        04/06/23 1311              Gerhard Munch, MD 04/06/23 1311

## 2023-04-06 NOTE — ED Triage Notes (Signed)
[facility] reports pt having a suprapubic catheter placed 8/29 with x1 day of ALF awareness of redness and warmth to site. Only 125 cc urine output noted yesterday in bag.

## 2023-04-09 LAB — URINE CULTURE: Culture: 20000 — AB

## 2023-04-10 ENCOUNTER — Telehealth (HOSPITAL_BASED_OUTPATIENT_CLINIC_OR_DEPARTMENT_OTHER): Payer: Self-pay | Admitting: *Deleted

## 2023-04-10 NOTE — Telephone Encounter (Signed)
Post ED Visit - Positive Culture Follow-up  Culture report reviewed by antimicrobial stewardship pharmacist: [facility] []  Enzo Bi, Pharm.D. []  Celedonio Miyamoto, [address].D., BCPS AQ-ID []  Garvin Fila, Pharm.D., BCPS []  Georgina Pillion, Pharm.D., BCPS []  [place], [address].D., BCPS, AAHIVP []  Estella Husk, Pharm.D., BCPS, AAHIVP []  Lysle Pearl, PharmD, BCPS []  Phillips Climes, PharmD, BCPS []  Agapito Games, PharmD, BCPS []  Verlan Friends, PharmD []  Mervyn Gay, PharmD, BCPS []  Vinnie Level, PharmD  [facility] []  Len Childs, PharmD []  Greer Pickerel, PharmD []  Adalberto Cole, PharmD []  Perlie Gold, Rph []  Lonell Face) Jean Rosenthal, PharmD []  Earl Many, PharmD []  Junita Push, PharmD []  Dorna Leitz, PharmD []  Terrilee Files, PharmD []  Lynann Beaver, PharmD [x]  Keturah Barre, PharmD []  Loralee Pacas, PharmD []  Bernadene Person, PharmD   Positive urine culture Treated with Ciprofloxacin, organism sensitive to the same and no further patient follow-up is required at this time.  Patsey Berthold 04/10/2023, 2:59 PM

## 2023-04-28 ENCOUNTER — Ambulatory Visit: Payer: Medicare Other | Admitting: Neurology

## 2023-05-13 ENCOUNTER — Other Ambulatory Visit (HOSPITAL_COMMUNITY): Payer: Self-pay | Admitting: Urology

## 2023-05-13 DIAGNOSIS — R339 Retention of urine, unspecified: Secondary | ICD-10-CM

## 2023-05-16 ENCOUNTER — Telehealth (HOSPITAL_COMMUNITY): Payer: Self-pay

## 2023-05-16 NOTE — Telephone Encounter (Signed)
Called to schedule tube upsize, no answer, left vm. AB

## 2023-05-24 ENCOUNTER — Other Ambulatory Visit: Payer: Self-pay | Admitting: Student

## 2023-05-24 DIAGNOSIS — Z01818 Encounter for other preprocedural examination: Secondary | ICD-10-CM

## 2023-05-25 ENCOUNTER — Encounter (HOSPITAL_COMMUNITY): Payer: Self-pay

## 2023-05-25 ENCOUNTER — Ambulatory Visit (HOSPITAL_COMMUNITY)
Admission: RE | Admit: 2023-05-25 | Discharge: 2023-05-25 | Disposition: A | Discharge status: Home / Self Care | Payer: Medicare Other | Source: Ambulatory Visit | Attending: Urology | Admitting: Urology

## 2023-05-25 ENCOUNTER — Other Ambulatory Visit: Payer: Self-pay

## 2023-05-25 DIAGNOSIS — R339 Retention of urine, unspecified: Secondary | ICD-10-CM | POA: Insufficient documentation

## 2023-05-25 DIAGNOSIS — Z435 Encounter for attention to cystostomy: Secondary | ICD-10-CM | POA: Diagnosis present

## 2023-05-25 DIAGNOSIS — Z01818 Encounter for other preprocedural examination: Secondary | ICD-10-CM

## 2023-05-25 HISTORY — PX: IR CATHETER TUBE CHANGE: IMG717

## 2023-05-25 MED ORDER — MIDAZOLAM HCL 2 MG/2ML IJ SOLN
INTRAMUSCULAR | Status: AC
  Filled 2023-05-25: qty 2

## 2023-05-25 MED ORDER — IOHEXOL 300 MG/ML  SOLN
50.0000 mL | Freq: Once | INTRAMUSCULAR | Status: AC | PRN
  Administered 2023-05-25: 10 mL

## 2023-05-25 MED ORDER — FENTANYL CITRATE (PF) 100 MCG/2ML IJ SOLN
INTRAMUSCULAR | Status: AC
  Filled 2023-05-25: qty 2

## 2023-05-25 MED ORDER — SODIUM CHLORIDE 0.9 % IV SOLN: INTRAVENOUS | Status: DC

## 2023-05-25 MED ORDER — FENTANYL CITRATE (PF) 100 MCG/2ML IJ SOLN
INTRAMUSCULAR | Status: AC | PRN
Start: 2023-05-25 — End: 2023-05-25
  Administered 2023-05-25: 25 ug via INTRAVENOUS

## 2023-05-25 MED ORDER — LIDOCAINE HCL 1 % IJ SOLN
INTRAMUSCULAR | Status: AC
  Filled 2023-05-25: qty 20

## 2023-05-25 MED ORDER — MIDAZOLAM HCL 2 MG/2ML IJ SOLN
INTRAMUSCULAR | Status: AC | PRN
Start: 2023-05-25 — End: 2023-05-25
  Administered 2023-05-25: .5 mg via INTRAVENOUS

## 2023-05-25 NOTE — H&P (Signed)
Chief Complaint: Patient was seen in consultation today for supra pubic catheter exchange/upsize at the request of Bell,Eugene D III  Referring Physician(s): Ray Church III  Supervising Physician: Richarda Overlie  Patient Status: [facility] - Out-pt  History of Present Illness: Renee Davidson is a 72 y.o. female   FULL Code status per pt and caregiver and family at bedside  Urinary retention Follows with Dr Edythe Lynn pubic catheter was placed with Dr Milford Cage in IR 03/31/23 Now back 6 weeks later for exchange/upsize of catheter  Doing well with catheter Care giver has brought a catheter that facility wants Dr Lowella Dandy to use --- "able to mange this catheter better at facility" Will discuss with Dr Lowella Dandy  Past Medical History:  Diagnosis Date   Anxiety    Cancer [facility])    malignant thyroid   CHF (congestive heart failure) (HCC)    hx of 09/2020 per note   Conversion disorder with seizures or convulsions    Depression    Diverticulosis    DVT (deep venous thrombosis) (HCC)    right   Dyspnea    Dysrhythmia    hx of SVT- 01/14/21 per note   Essential hypertension    Fracture of right humerus    GERD (gastroesophageal reflux disease)    Hiatal hernia    Hiatal hernia    Humerus fracture    right   Hx of falling    Hyperlipidemia    Hypothyroidism    Mental developmental delay    OCD (obsessive compulsive disorder)    Osteoporosis    Peptic ulcer    Pneumonia    hx of pneumonal several times per sister at preop   Thyroid disease    Thyroid nodule     Past Surgical History:  Procedure Laterality Date   BIOPSY  11/02/2019   Procedure: BIOPSY;  Surgeon: Iva Boop, MD;  Location: [facility];  Service: Endoscopy;;   COLONOSCOPY WITH PROPOFOL N/A 11/02/2019   Procedure: COLONOSCOPY WITH PROPOFOL;  Surgeon: Iva Boop, MD;  Location: [facility];  Service: Endoscopy;  Laterality: N/A;   ESOPHAGOGASTRODUODENOSCOPY (EGD) WITH PROPOFOL N/A 11/01/2019   Procedure:  ESOPHAGOGASTRODUODENOSCOPY (EGD) WITH PROPOFOL;  Surgeon: Iva Boop, MD;  Location: [facility];  Service: Endoscopy;  Laterality: N/A;   HUMERUS FRACTURE SURGERY Right 08/13/2020   THYROID LOBECTOMY Right 12/21/2021   Procedure: RIGHT THYROID LOBECTOMY;  Surgeon: Darnell Level, MD;  Location: [facility];  Service: General;  Laterality: Right;   TONSILLECTOMY      Allergies: Keflex [cephalexin], Sulfamethoxazole, and Doxycycline  Medications: Prior to Admission medications   Medication Sig Start Date End Date Taking? Authorizing Provider  Acetaminophen 500 MG capsule Take 1,000 mg by mouth in the morning, at noon, and at bedtime.    [provider]  albuterol (VENTOLIN HFA) 108 (90 Base) MCG/ACT inhaler Inhale into the lungs 2 (two) times daily as needed for wheezing or shortness of breath.    [provider]  allopurinol (ZYLOPRIM) 100 MG tablet Take 100 mg by mouth daily. 05/18/21   [provider]  aspirin 81 MG EC tablet Take 81 mg by mouth daily. Swallow whole.    [provider]  benzonatate (TESSALON) 100 MG capsule Take 1 capsule (100 mg total) by mouth 3 (three) times daily as needed for cough. 11/08/22   Glenford Bayley, NP  cetirizine (ZYRTEC) 10 MG tablet Take 10 mg by mouth daily. Patient not taking: Reported on 01/20/2023 10/13/22  [provider]  ciprofloxacin (CIPRO) 500 MG tablet Take 1 tablet (500 mg total) by mouth every 12 (twelve) hours. 04/06/23   Gerhard Munch, MD  dextromethorphan (DELSYM) 30 MG/5ML liquid Take 60 mg by mouth every 12 (twelve) hours as needed for cough.    [provider]  Dextromethorphan-guaiFENesin 10-100 MG/5ML liquid Take 10 mLs by mouth daily as needed (cough).    [provider]  diclofenac Sodium (VOLTAREN) 1 % GEL Apply 1 application  topically in the morning, at noon, and at bedtime.    [provider]  docusate sodium (COLACE) 100 MG capsule Take 100 mg by mouth  daily.    [provider]  escitalopram (LEXAPRO) 20 MG tablet Take 1 tablet (20 mg total) by mouth daily. 02/22/20   [name] Mikele-Margaret, FNP  ferrous sulfate 325 (65 FE) MG tablet Take 1 tablet (325 mg total) by mouth daily with breakfast. 02/22/20   [name], Aloni-Margaret, FNP  furosemide (LASIX) 20 MG tablet Take 20 mg by mouth daily. 05/25/21   [provider]  glycerin adult 2 g suppository Place 1 suppository rectally as needed for constipation. 01/16/23   Ernie Avena, MD  hydrochlorothiazide (MICROZIDE) 12.5 MG capsule Take 12.5 mg by mouth daily. 07/18/21   [provider]  hydrocortisone cream 1 % Apply 1 Application topically 3 (three) times daily.    [provider]  ipratropium-albuterol (DUONEB) 0.5-2.5 (3) MG/3ML SOLN Take 3 mLs by nebulization every 6 (six) hours as needed.    [provider]  levothyroxine (SYNTHROID) 100 MCG tablet Take 1 tablet (100 mcg total) by mouth daily before breakfast. 01/20/23   [name], Denman George, MD  LORazepam (ATIVAN) 0.5 MG tablet Take 0.5 mg by mouth at bedtime.    [provider]  lovastatin (MEVACOR) 40 MG tablet Take 1 tablet (40 mg total) by mouth at bedtime. 12/04/19   [name], Jamiracle-Margaret, FNP  methocarbamol (ROBAXIN) 500 MG tablet Take 500 mg by mouth every 8 (eight) hours as needed for muscle spasms. 07/31/21   [provider]  metoprolol tartrate (LOPRESSOR) 25 MG tablet Take 12.5 mg by mouth 2 (two) times daily. 05/13/21   [provider]  mineral oil-hydrophilic petrolatum (AQUAPHOR) ointment Apply 1 application. topically in the morning, at noon, and at bedtime. Apply to buttocks and crease of buttocks for redness and irritation    [provider]  MUCINEX 600 MG 12 hr tablet Take 600 mg by mouth 2 (two) times daily. 06/22/21   [provider]  nystatin (MYCOSTATIN/NYSTOP) powder Apply 1 application  topically 3 (three) times daily. Under breasts and  groin area 06/01/21   [provider]  OLANZapine (ZYPREXA) 2.5 MG tablet Take 2.5 mg by mouth daily with supper.    [provider]  oxybutynin (DITROPAN-XL) 5 MG 24 hr tablet Take 5 mg by mouth at bedtime.    [provider]  pantoprazole (PROTONIX) 40 MG tablet TAKE 1 TABLET DAILY 07/21/20   [name], Fallynn-Margaret, FNP  polyvinyl alcohol (LIQUIFILM TEARS) 1.4 % ophthalmic solution Place 1 drop into both eyes as needed for dry eyes.    [provider]  potassium chloride 20 MEQ/15ML (10%) SOLN Take 20 mEq by mouth daily. 08/25/21   [provider]  rivaroxaban (XARELTO) 20 MG TABS tablet Take 20 mg by mouth daily. 04/30/21   [provider]  traMADol (ULTRAM) 50 MG tablet Take 50 mg by mouth every 6 (six) hours as needed for moderate pain.  [provider]  vitamin C (ASCORBIC ACID) 250 MG tablet Take 250 mg by mouth daily.    [provider]     Family History  Problem Relation Age of Onset   GI problems Mother    Hypertension Mother    Hyperlipidemia Mother    Heart disease Mother    Diabetes Father    Hyperlipidemia Father    Hypertension Father    Atrial fibrillation Father    Heart attack Father    Osteoporosis Father    Colon polyps Father    Hyperlipidemia Sister    Dysphagia Sister    Heart disease Maternal Grandfather    Colon cancer Maternal Grandfather    Diabetes Paternal Grandmother    Heart disease Paternal Grandmother     Social History   Socioeconomic History   Marital status: Single    Spouse name: Not on file   Number of children: 0   Years of education: 12   Highest education level: 12th grade  Occupational History   Occupation: retired    Comment: Public librarian  Tobacco Use   Smoking status: Never   Smokeless tobacco: Never  Vaping Use   Vaping status: Never Used  Substance and Sexual Activity   Alcohol use: No   Drug use: No   Sexual activity: Never  Other Topics Concern    Not on file  Social History Narrative   Not on file   Social Determinants of Health   Financial Resource Strain: Low Risk  (12/21/2022)   Received from [facility], [facility]   Overall Financial Resource Strain (CARDIA)    Difficulty of Paying Living Expenses: Not hard at all  Food Insecurity: No Food Insecurity (12/21/2022)   Received from [facility], [facility]   Hunger Vital Sign    Worried About Running Out of Food in the Last Year: Never true    Ran Out of Food in the Last Year: Never true  Transportation Needs: No Transportation Needs (12/21/2022)   Received from [employer], [facility]   PRAPARE - Transportation    Lack of Transportation (Medical): No    Lack of Transportation (Non-Medical): No  Physical Activity: Unknown (12/21/2022)   Received from [facility], [facility]   Exercise Vital Sign    Days of Exercise per Week: 0 days    Minutes of Exercise per Session: Not on file  Stress: No Stress Concern Present (12/21/2022)   Received from [facility], [facility] - Occupational Stress Questionnaire    Feeling of Stress : Not at all  Social Connections: Somewhat Isolated (12/21/2022)   Received from [facility], [facility]   Social Network    How would you rate your social network (family, work, friends)?: Restricted participation with some degree of social isolation    Review of Systems: A 12 point ROS discussed and pertinent positives are indicated in the HPI above.  All other systems are negative.  Review of Systems  Constitutional:  Negative for activity change and fever.  Respiratory:  Negative for cough and shortness of breath.   Gastrointestinal:  Negative for abdominal pain.  Psychiatric/Behavioral:  Positive for decreased concentration. Negative for behavioral problems.     Vital Signs: BP 128/76   Pulse 76   Temp 97.6 F (36.4 C) (Temporal)   Resp 18   Ht 5' 5.5" (1.664 m)   Wt  243 lb (110.2 kg)   SpO2 93%  BMI 39.82 kg/m   Advance Care Plan: The advanced care plan/surrogate decision maker was discussed at the time of visit and documented in the medical record.    Physical Exam Vitals reviewed.  HENT:     Mouth/Throat:     Mouth: Mucous membranes are moist.  Cardiovascular:     Rate and Rhythm: Normal rate and regular rhythm.     Heart sounds: Normal heart sounds.  Pulmonary:     Effort: Pulmonary effort is normal.     Breath sounds: Normal breath sounds. No wheezing.  Abdominal:     Palpations: Abdomen is soft.  Musculoskeletal:        General: Normal range of motion.  Skin:    General: Skin is warm.  Neurological:     Mental Status: She is alert. Mental status is at baseline.  Psychiatric:     Comments: Consented with sister Marlon Pel via phone     Imaging: No results found.  Labs:  CBC: Recent Labs    11/28/22 2006 12/08/22 1126 01/16/23 0911 04/06/23 1001  WBC 5.1 7.9 5.8 8.4  HGB 13.6 14.0 13.0 13.6  HCT 41.9 44.0 39.5 42.8  PLT 241 237 219 210    COAGS: No results for input(s): "INR", "APTT" in the last 8760 hours.  BMP: Recent Labs    11/28/22 2006 12/08/22 1126 01/16/23 0911 04/06/23 1001  NA 139 136 136 136  K 3.5 4.1 3.8 4.2  CL 100 99 98 97*  CO2 31 27 30 28   GLUCOSE 97 107* 106* 107*  BUN 16 15 11 12   CALCIUM 9.1 9.0 9.3 8.5*  CREATININE 0.98 1.02* 0.83 0.91  GFRNONAA >60 58* >60 >60    LIVER FUNCTION TESTS: Recent Labs    11/28/22 2006 12/08/22 1126 01/16/23 0911 04/06/23 1001  BILITOT 0.4 0.5 0.5 0.7  AST 15 20 14* 15  ALT 9 16 9 14   ALKPHOS 101 93 98 84  PROT 7.0 7.1 6.8 7.3  ALBUMIN 4.3 3.9 4.0 3.4*    TUMOR MARKERS: No results for input(s): "AFPTM", "CEA", "CA199", "CHROMGRNA" in the last 8760 hours.  Assessment and Plan:  Scheduled for supra pubic catheter exchange/upsize in IR Pt and family are aware of procedure benefits abd risks including but not limited to Infection;  bleeding; damage to surrounding structures Spoke to sister Marlon Pel via phone--- she consents to procedure   Thank you for this interesting consult.  I greatly enjoyed meeting PAISLY STACER and look forward to participating in their care.  A copy of this report was sent to the requesting provider on this date.  Electronically Signed: Robet Leu, PA-C 05/25/2023, 10:22 AM   I spent a total of    25 Minutes in face to face in clinical consultation, greater than 50% of which was counseling/coordinating care for suprapubic catheter upsize/exchange

## 2023-05-25 NOTE — Procedures (Signed)
Interventional Radiology Procedure:   Indications: Routine exchange of suprapubic tube  Procedure: SPT exchange  Findings: New 16 Fr balloon retention tube was placed with fluoroscopy.   Complications: None     EBL: Minimal  Plan: Discharge in 1 hour  Seema Blum R. Lowella Dandy, MD  Pager: [phone]

## 2023-08-08 ENCOUNTER — Ambulatory Visit: Payer: Medicare Other | Admitting: "Endocrinology

## 2023-12-01 DEATH — deceased
